# Patient Record
Sex: Female | Born: 1935 | Race: Black or African American | Hispanic: No | Smoking: Current every day smoker
Health system: Southern US, Community
[De-identification: ages and names within clinical notes are randomized; demographics above are authoritative.]

## PROBLEM LIST (undated history)

## (undated) DIAGNOSIS — R202 Paresthesia of skin: Secondary | ICD-10-CM

## (undated) DIAGNOSIS — M51369 Other intervertebral disc degeneration, lumbar region without mention of lumbar back pain or lower extremity pain: Secondary | ICD-10-CM

## (undated) DIAGNOSIS — E559 Vitamin D deficiency, unspecified: Secondary | ICD-10-CM

## (undated) DIAGNOSIS — M1990 Unspecified osteoarthritis, unspecified site: Secondary | ICD-10-CM

## (undated) DIAGNOSIS — I639 Cerebral infarction, unspecified: Secondary | ICD-10-CM

## (undated) DIAGNOSIS — R55 Syncope and collapse: Secondary | ICD-10-CM

## (undated) DIAGNOSIS — I82409 Acute embolism and thrombosis of unspecified deep veins of unspecified lower extremity: Secondary | ICD-10-CM

## (undated) DIAGNOSIS — G5600 Carpal tunnel syndrome, unspecified upper limb: Secondary | ICD-10-CM

## (undated) DIAGNOSIS — M5136 Other intervertebral disc degeneration, lumbar region: Secondary | ICD-10-CM

## (undated) DIAGNOSIS — J309 Allergic rhinitis, unspecified: Secondary | ICD-10-CM

## (undated) DIAGNOSIS — N63 Unspecified lump in unspecified breast: Secondary | ICD-10-CM

## (undated) DIAGNOSIS — I10 Essential (primary) hypertension: Secondary | ICD-10-CM

## (undated) HISTORY — PX: CARPAL TUNNEL RELEASE: SHX101

## (undated) HISTORY — DX: Paresthesia of skin: R20.2

## (undated) HISTORY — DX: Carpal tunnel syndrome, unspecified upper limb: G56.00

## (undated) HISTORY — PX: ABDOMINAL HYSTERECTOMY: SHX81

## (undated) HISTORY — PX: TOTAL HIP ARTHROPLASTY: SHX124

## (undated) HISTORY — PX: HEMORROIDECTOMY: SUR656

## (undated) HISTORY — DX: Allergic rhinitis, unspecified: J30.9

## (undated) HISTORY — DX: Vitamin D deficiency, unspecified: E55.9

## (undated) HISTORY — PX: ULNAR NERVE TRANSPOSITION: SHX2595

## (undated) HISTORY — DX: Acute embolism and thrombosis of unspecified deep veins of unspecified lower extremity: I82.409

## (undated) HISTORY — DX: Other intervertebral disc degeneration, lumbar region: M51.36

## (undated) HISTORY — DX: Other intervertebral disc degeneration, lumbar region without mention of lumbar back pain or lower extremity pain: M51.369

## (undated) HISTORY — PX: OTHER SURGICAL HISTORY: SHX169

## (undated) HISTORY — DX: Unspecified lump in unspecified breast: N63.0

## (undated) HISTORY — DX: Syncope and collapse: R55

## (undated) HISTORY — DX: Unspecified osteoarthritis, unspecified site: M19.90

## (undated) HISTORY — PX: CATARACT EXTRACTION: SUR2

---

## 1997-07-18 ENCOUNTER — Emergency Department (HOSPITAL_COMMUNITY): Admission: EM | Admit: 1997-07-18 | Discharge: 1997-07-18 | Payer: Self-pay | Admitting: *Deleted

## 1999-05-26 ENCOUNTER — Other Ambulatory Visit: Admission: RE | Admit: 1999-05-26 | Discharge: 1999-05-26 | Payer: Self-pay | Admitting: Family Medicine

## 2001-11-06 ENCOUNTER — Encounter: Admission: RE | Admit: 2001-11-06 | Discharge: 2001-11-06 | Payer: Self-pay | Admitting: Family Medicine

## 2001-11-06 ENCOUNTER — Encounter: Payer: Self-pay | Admitting: Family Medicine

## 2002-05-21 ENCOUNTER — Encounter: Payer: Self-pay | Admitting: Family Medicine

## 2002-05-21 ENCOUNTER — Encounter: Admission: RE | Admit: 2002-05-21 | Discharge: 2002-05-21 | Payer: Self-pay | Admitting: Family Medicine

## 2004-07-28 ENCOUNTER — Other Ambulatory Visit: Admission: RE | Admit: 2004-07-28 | Discharge: 2004-07-28 | Payer: Self-pay | Admitting: Family Medicine

## 2004-08-10 ENCOUNTER — Ambulatory Visit (HOSPITAL_COMMUNITY): Admission: RE | Admit: 2004-08-10 | Discharge: 2004-08-10 | Payer: Self-pay | Admitting: Family Medicine

## 2004-12-17 ENCOUNTER — Ambulatory Visit (HOSPITAL_COMMUNITY): Admission: RE | Admit: 2004-12-17 | Discharge: 2004-12-17 | Payer: Self-pay | Admitting: Gastroenterology

## 2005-01-05 IMAGING — CR DG CHEST 2V
2 series · 2 of 2 positions shown · non-contrast
Comparison: Report from prior exam dated [DATE].

CLINICAL DATA: Osteoarthritis.  Preoperative for right total hip arthroplasty.  History of smoking.  
 CHEST - 2 VIEW:

[view not recorded (1 of 2)]
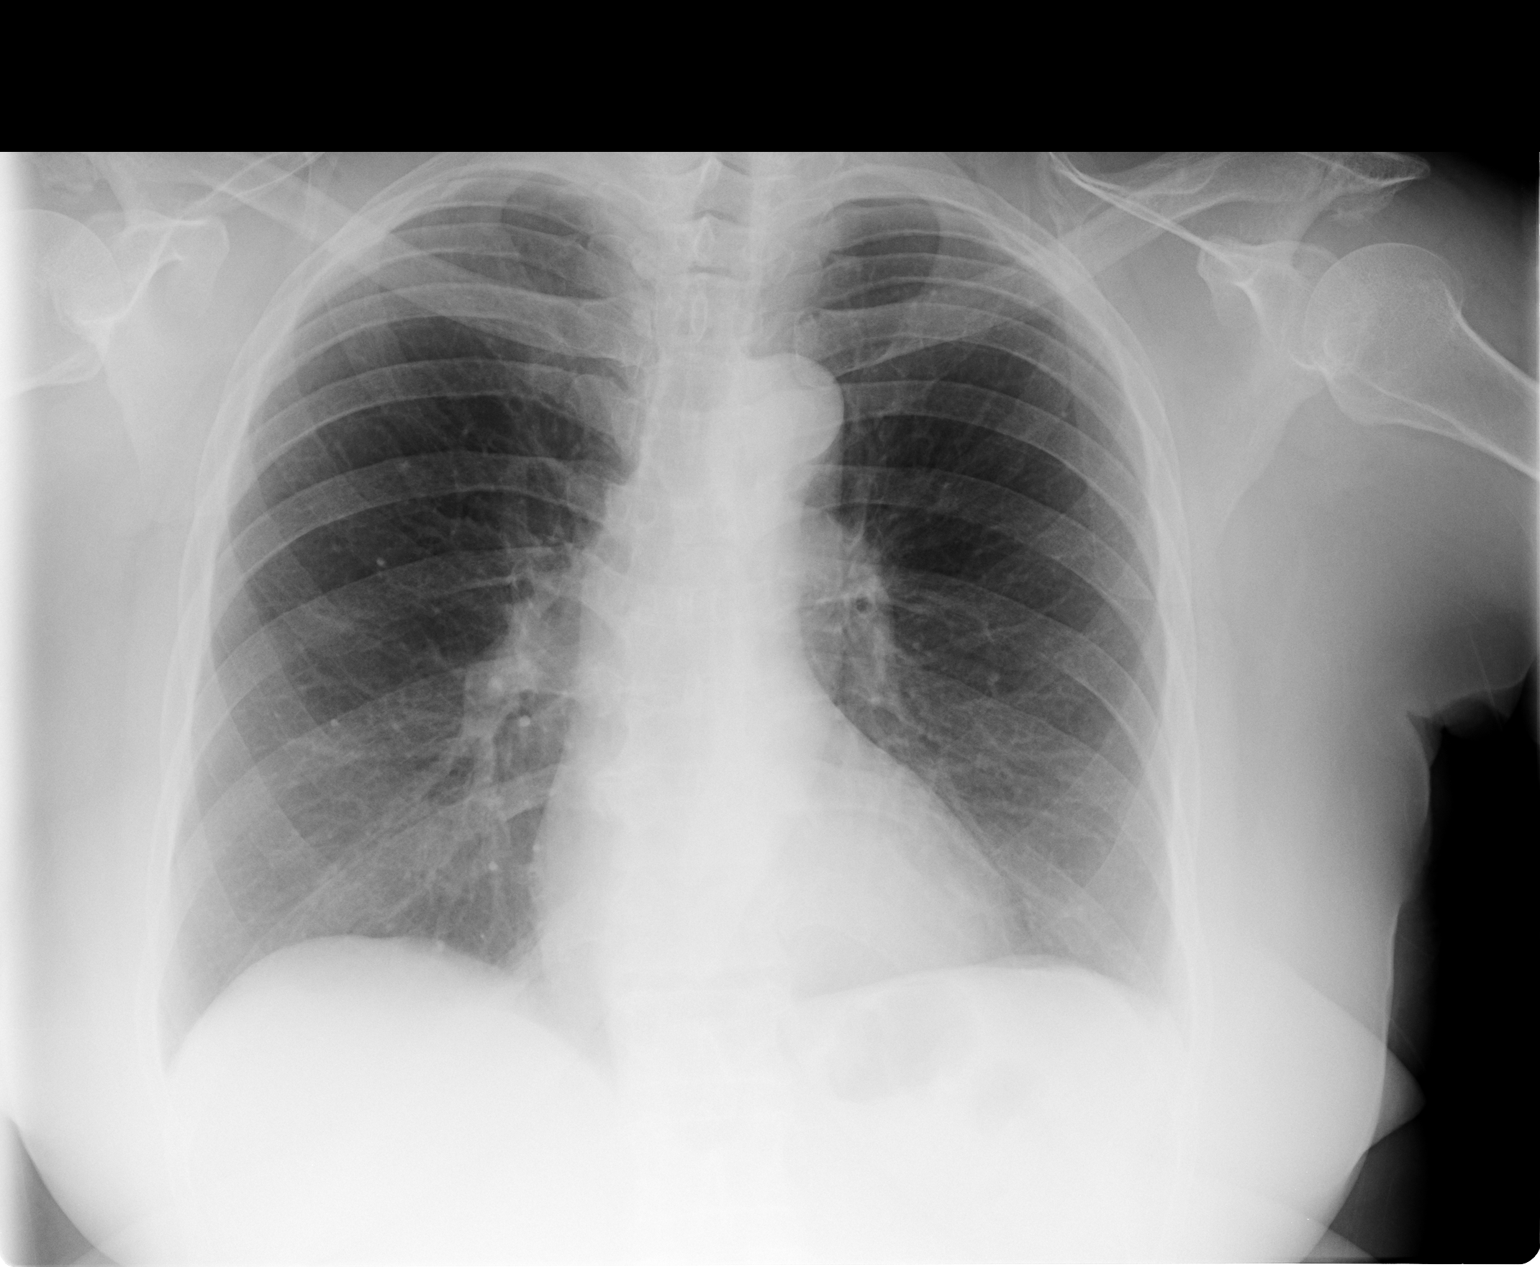

[view not recorded (2 of 2)]
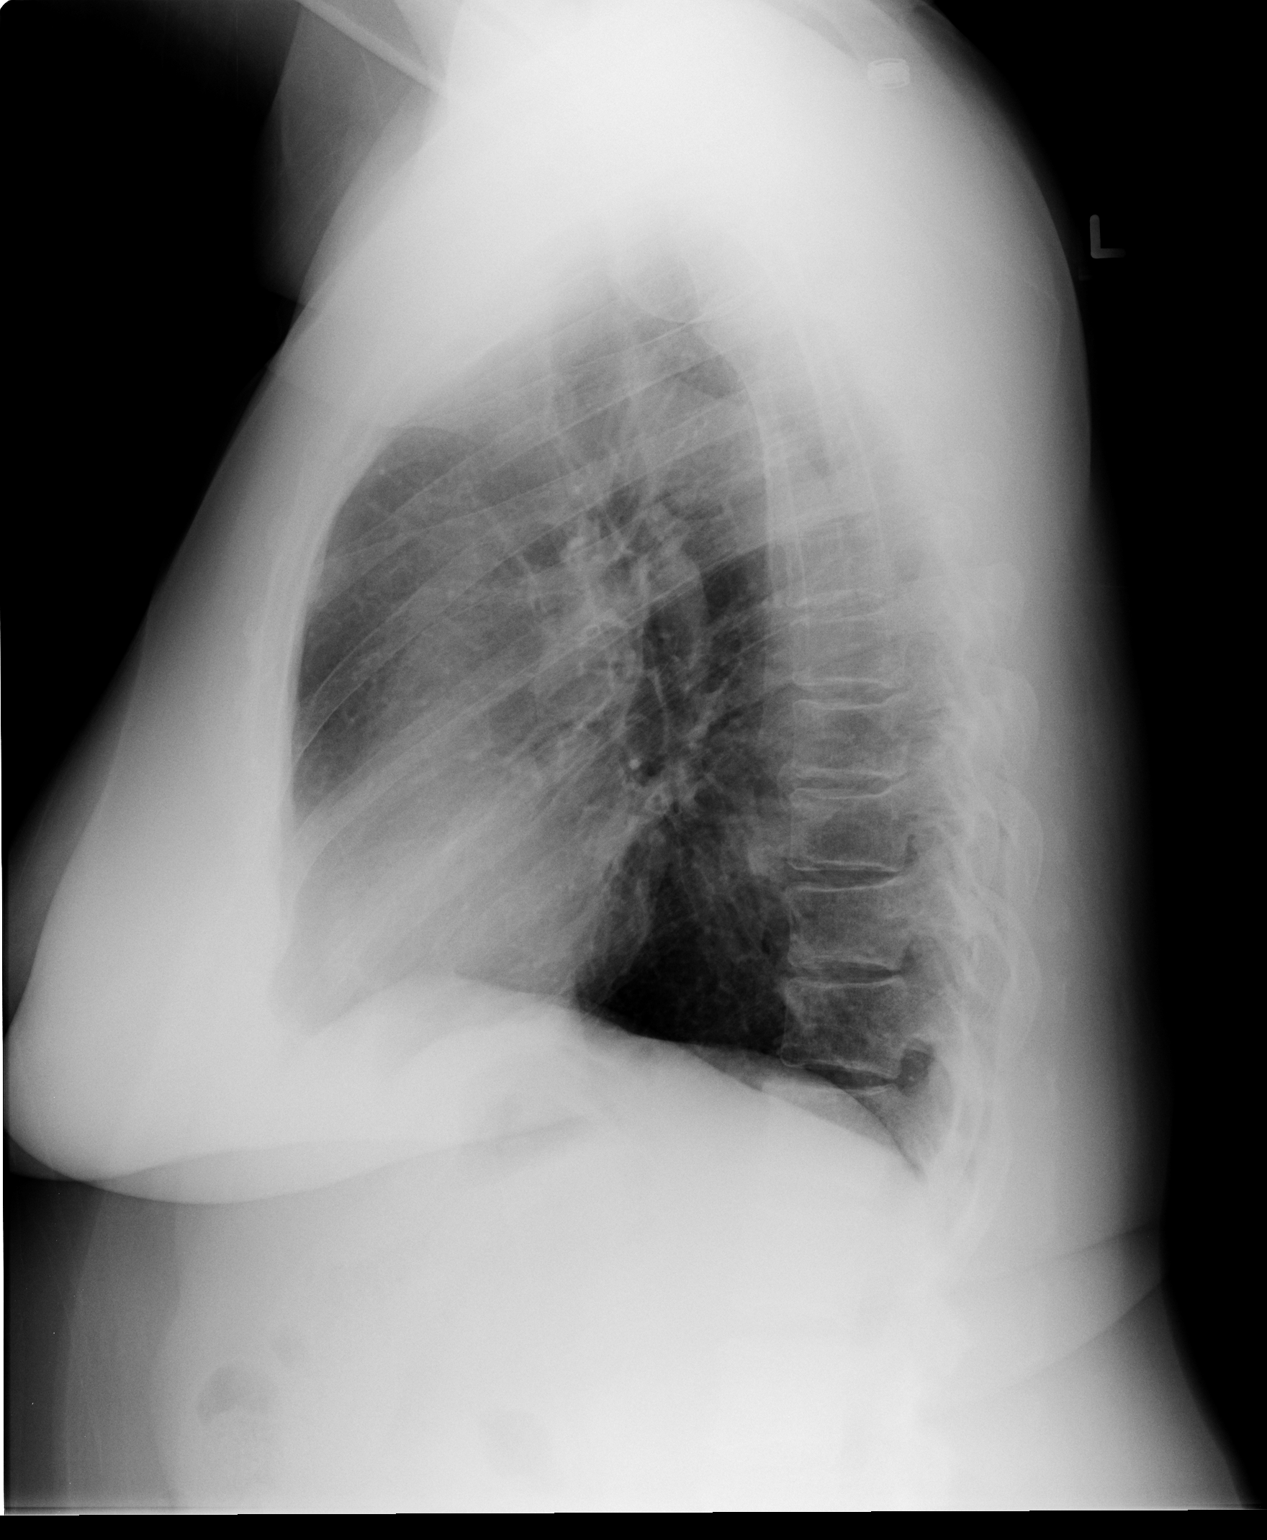

[2 of 2 positions shown; findings below may reference images not displayed]

Mild airway thickening is present.  Heart and mediastinum appear unremarkable.  
 On the lateral view there is vague nodularity anterior to the T9-10 level which is likely due to confluent vascular shadows and less likely to represent a pulmonary nodule.  No nodule is seen on the frontal view.  I do recommend followup chest radiography in one month?s time to reevaluate this region.   
 Alternatively, chest CT could be utilized to clear this region.
IMPRESSION: 1.  Airway thickening suggesting bronchitis.  
 2.  Vague nodularity just anterior to the thoracic spine at approximately the T9-10 level, only seen on the lateral view.   This is likely due to confluence of vascular shadows.  Recommend either followup chest radiography or chest CT to ensure clearance and exclude the unlikely possibility of malignancy.

## 2005-01-10 ENCOUNTER — Inpatient Hospital Stay (HOSPITAL_COMMUNITY): Admission: RE | Admit: 2005-01-10 | Discharge: 2005-01-14 | Payer: Self-pay | Admitting: Orthopedic Surgery

## 2005-01-10 IMAGING — CR DG HIP COMPLETE 2+V*R*
2 series · 2 of 2 positions shown · non-contrast
Comparison: none

CLINICAL DATA: Osteoarthritis, status post right total hip arthroplasty.
 RIGHT HIP ? 2 VIEWS:

[view not recorded (1 of 2)]
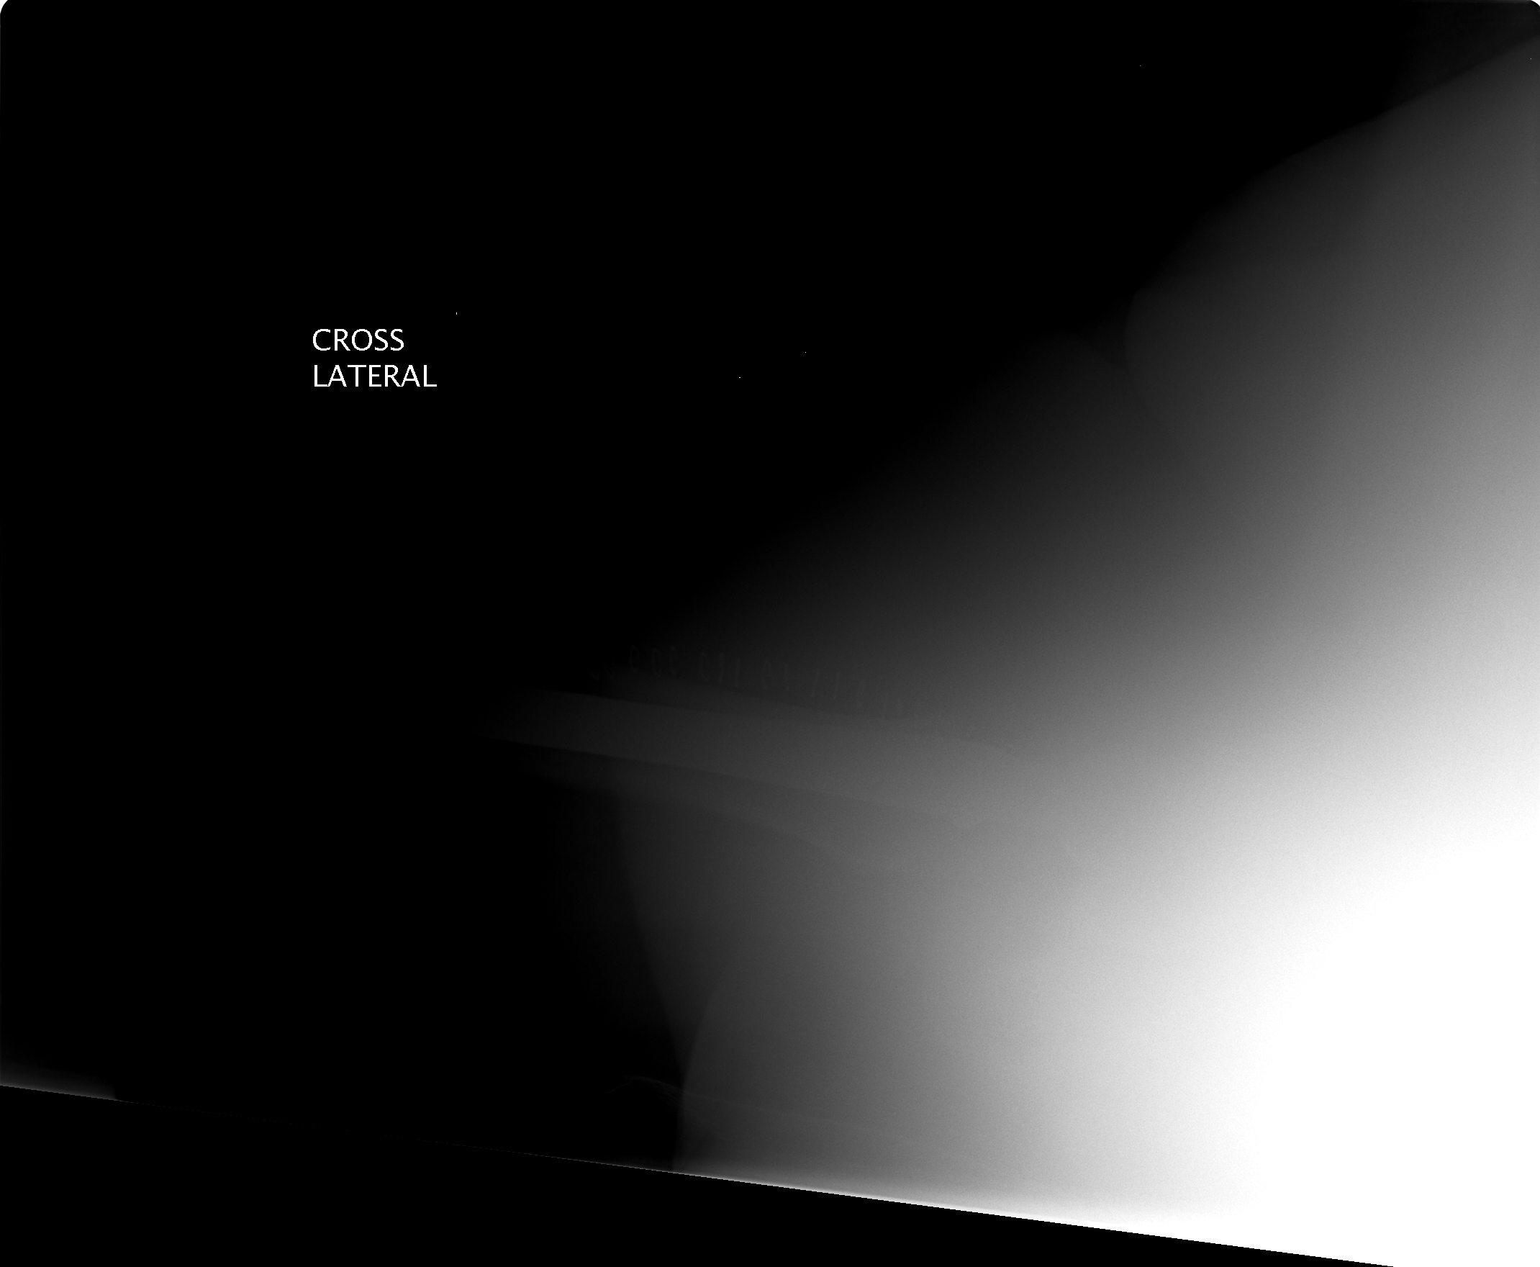

[view not recorded (2 of 2)]
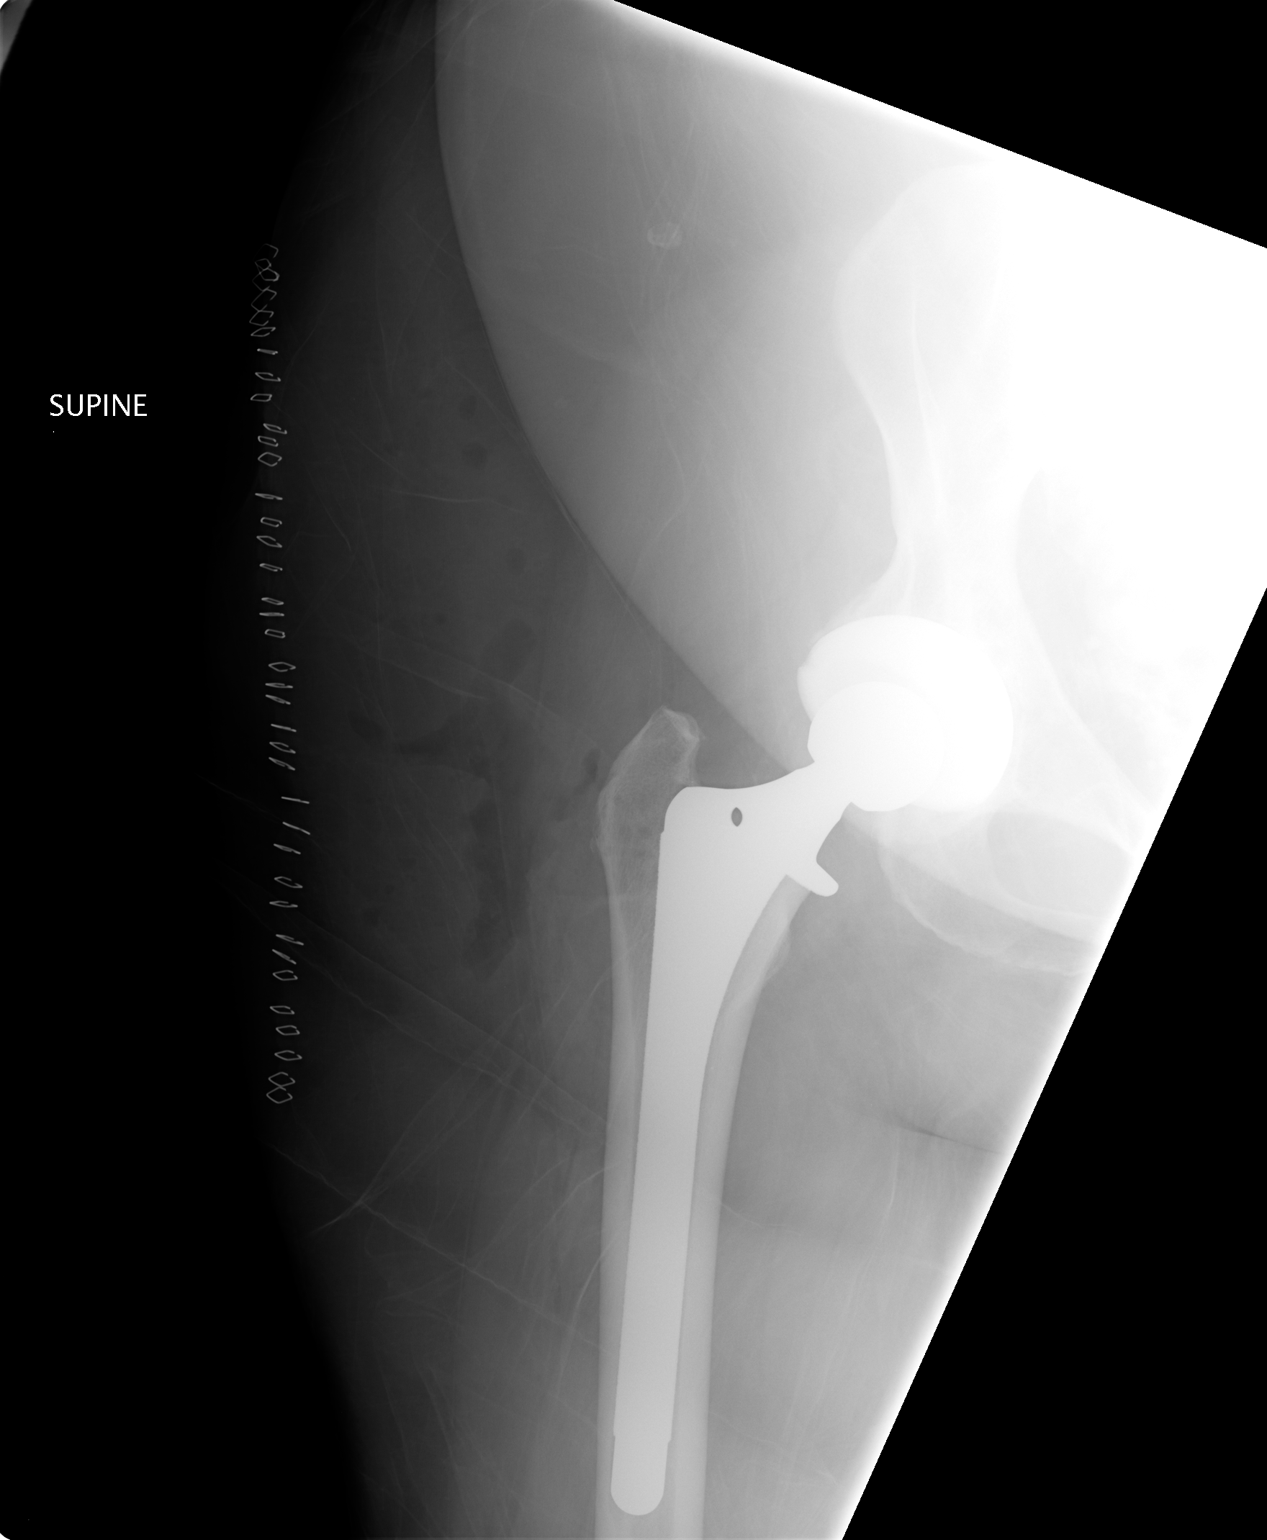

[2 of 2 positions shown; findings below may reference images not displayed]

FINDINGS: The crosstable lateral view of the right hip is non-diagnostic due to patient body habitus.  The frontal view of the right hip shows a total hip arthroplasty without immediate complicating features.  Subcutaneous air and surgical skin staples are noted.
IMPRESSION: Please see above.

## 2005-01-12 IMAGING — US US RENAL
1 series · 14 of 24 positions shown · non-contrast
Comparison: none

CLINICAL DATA: This is a 76-year-old male with decreased urine output.  Elevated creatinine of 2.2.  Hypertension. 
RENAL/URINARY TRACT ULTRASOUND:
TECHNIQUE: Complete ultrasound examination of the urinary tract was performed including evaluation of the kidneys, renal collecting systems, and urinary bladder.

[Series 1: unknown · 0.29mm/px · 14 of 24 slices shown]
[im 1/24]
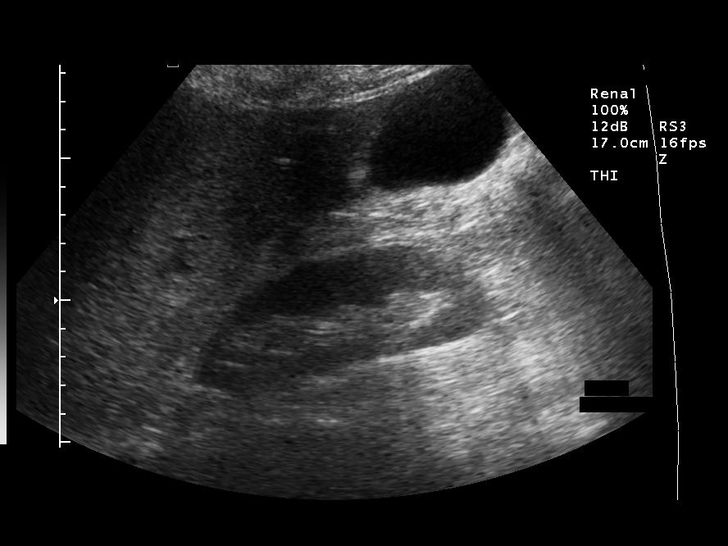
[im 3/24]
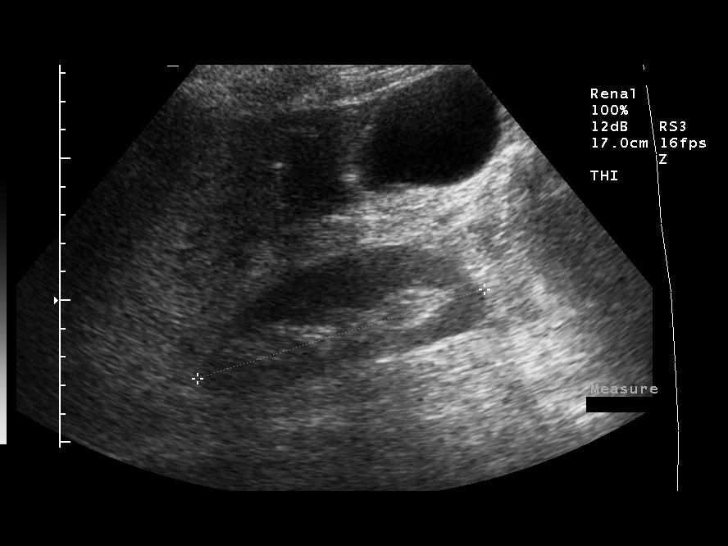
[im 5/24]
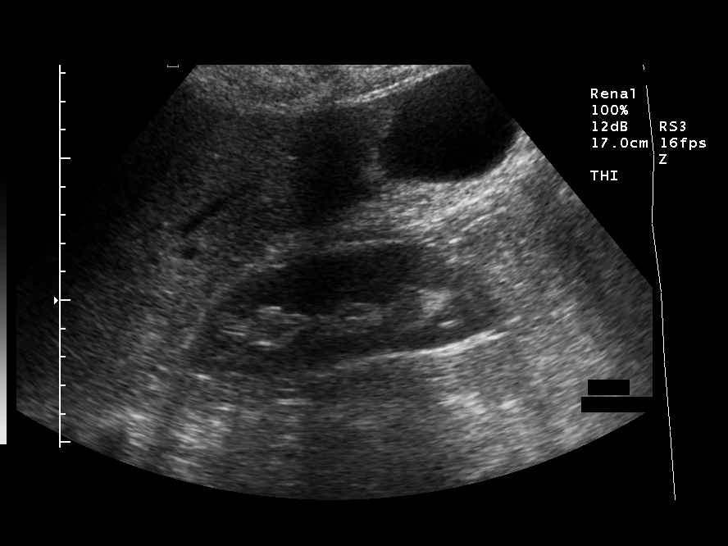
[im 7/24]
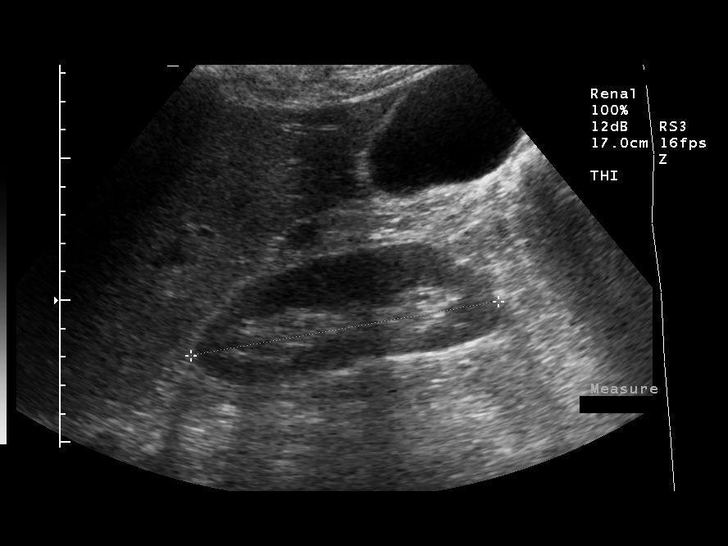
[im 8/24]
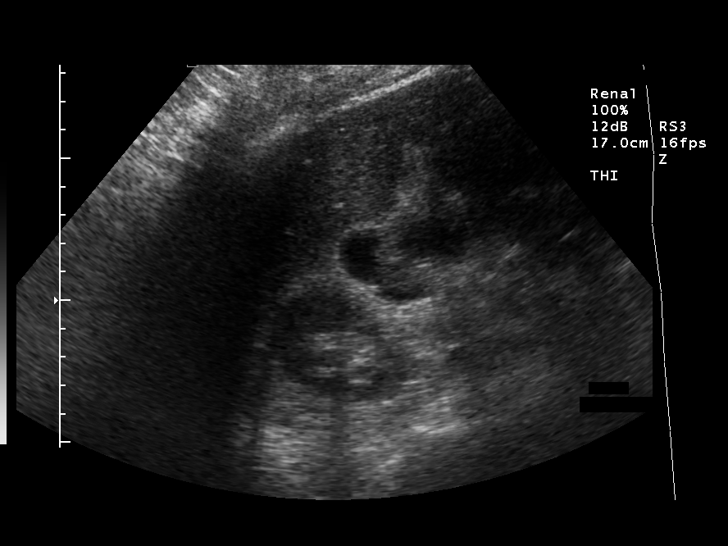
[im 10/24]
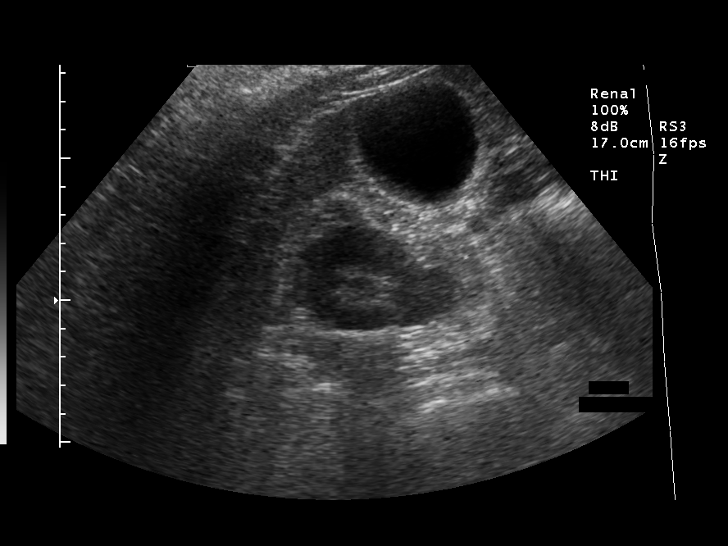
[im 12/24]
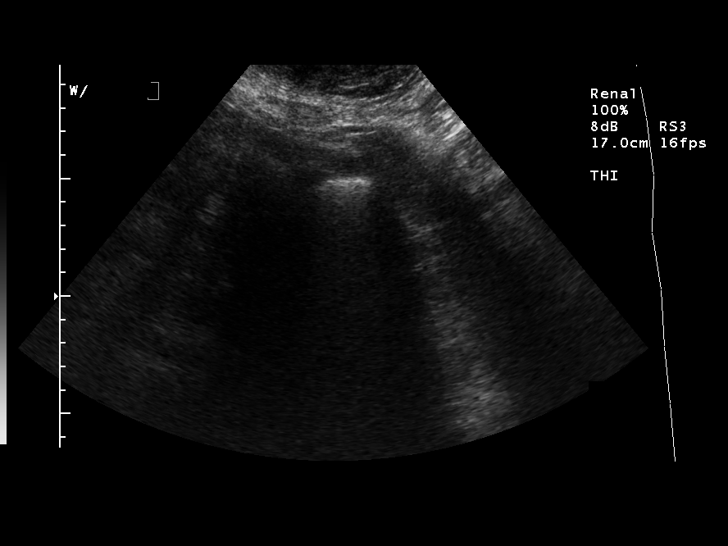
[im 13/24]
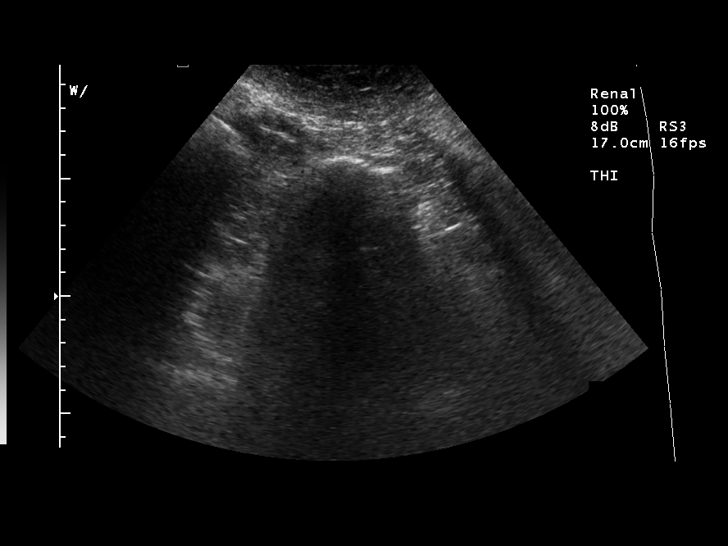
[im 15/24]
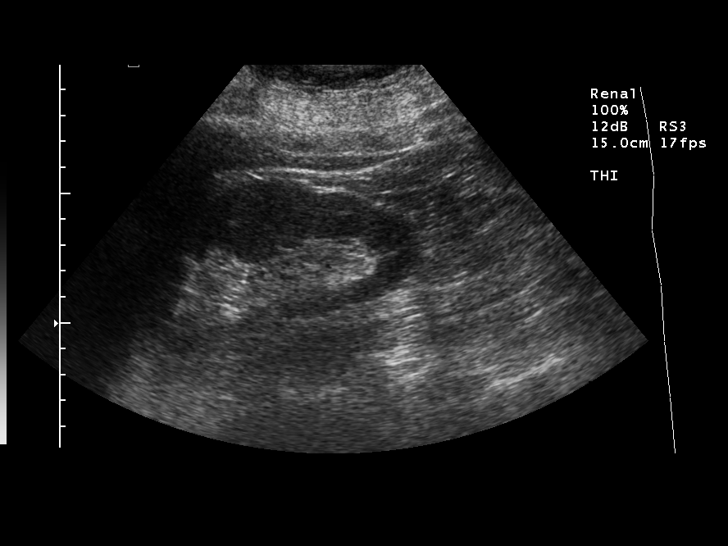
[im 17/24]
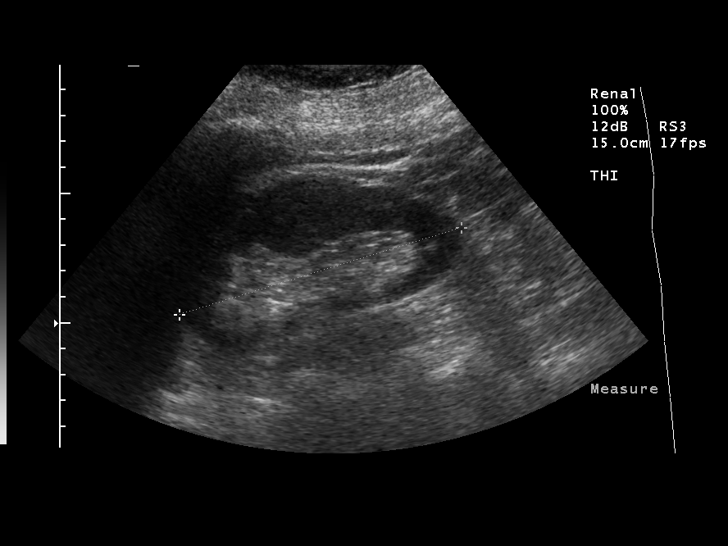
[im 19/24]
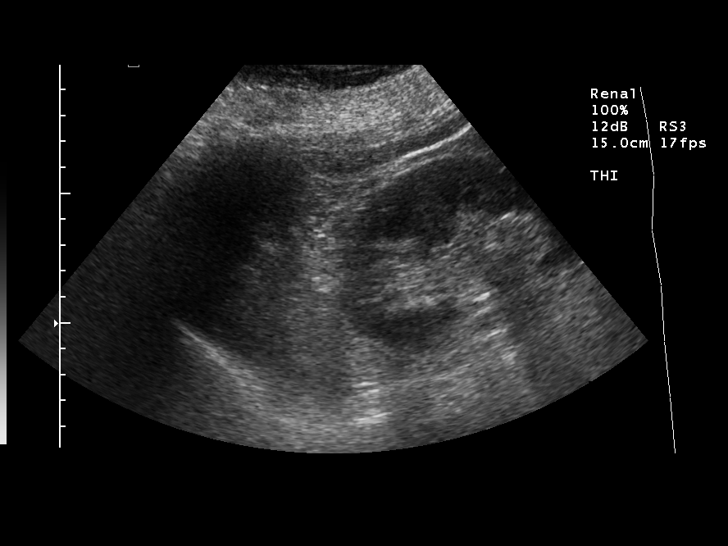
[im 20/24]
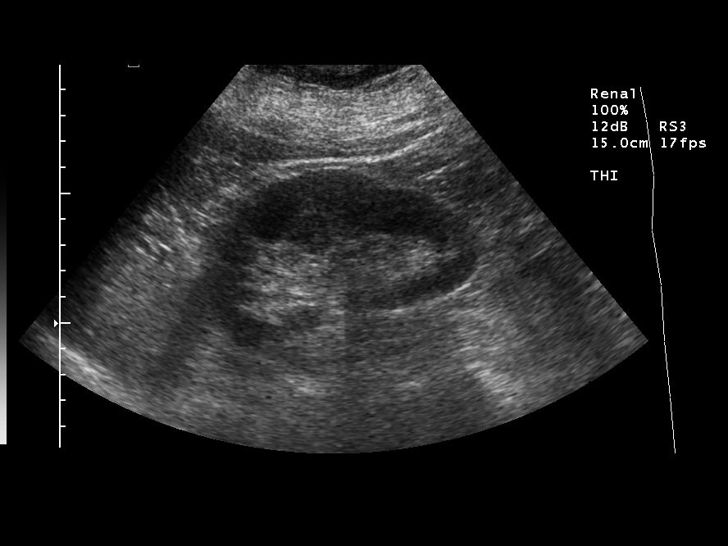
[im 22/24]
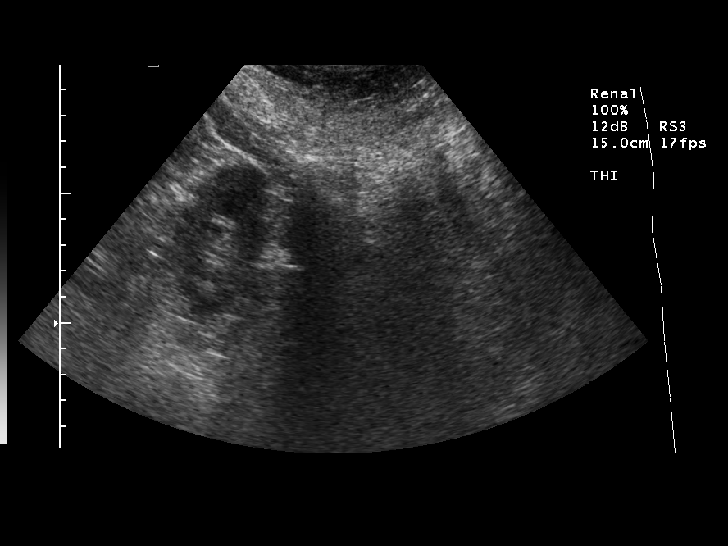
[im 24/24]
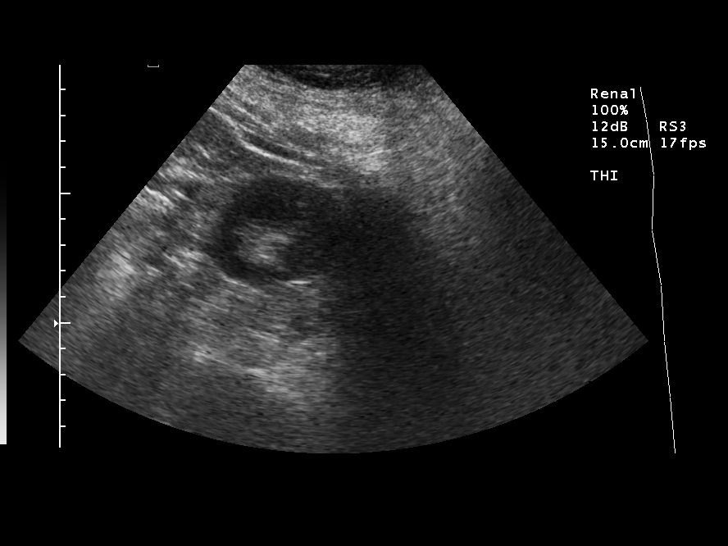

[14 of 24 positions shown; findings below may reference images not displayed]

FINDINGS: The right kidney measures 11 cm.  The left kidney measures 11.4 cm.  No acute hydronephrosis or obstruction.  The bladder is decompressed, containing a Foley catheter.  No abdominal ascites.
IMPRESSION: No acute finding by ultrasound.

## 2005-02-10 IMAGING — CR DG CHEST 2V
2 series · 2 of 2 positions shown · non-contrast
Comparison: [DATE].

CLINICAL DATA: Infected right hip.  Pre-admission.  
 CHEST - 2 VIEWS:

[view not recorded (1 of 2)]
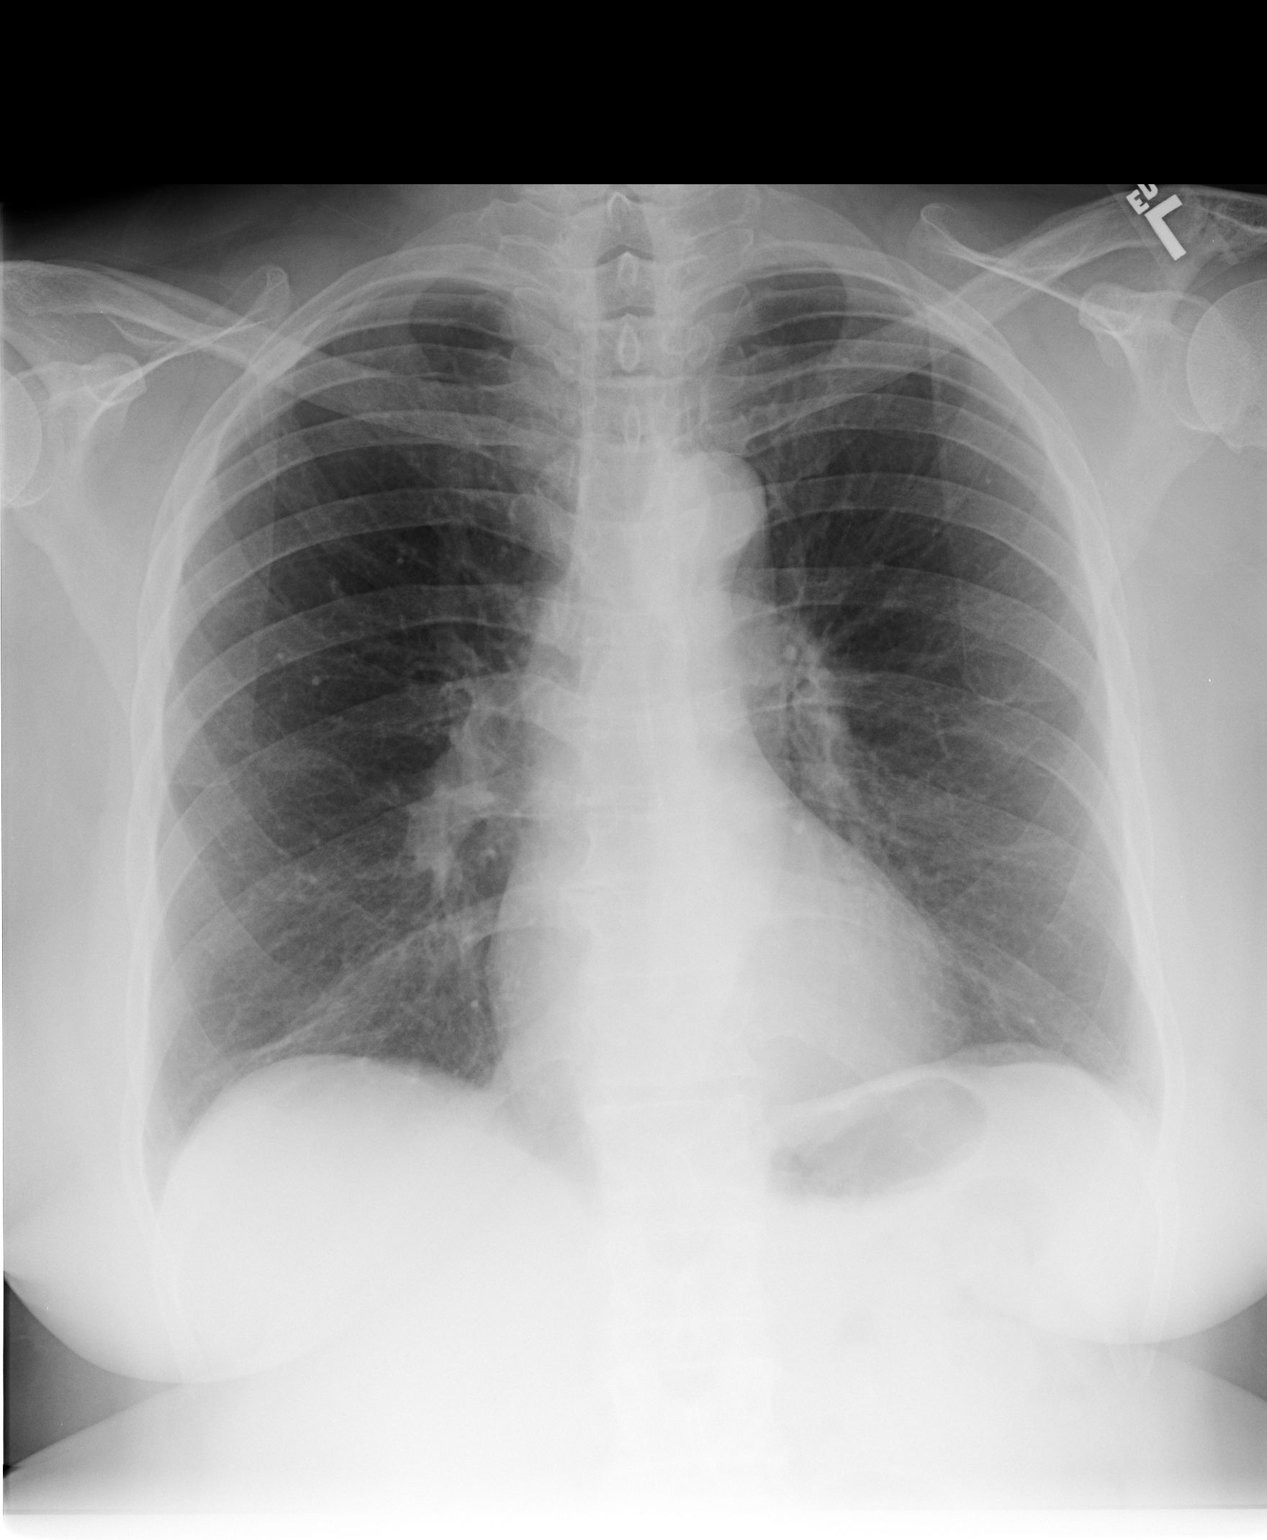

[view not recorded (2 of 2)]
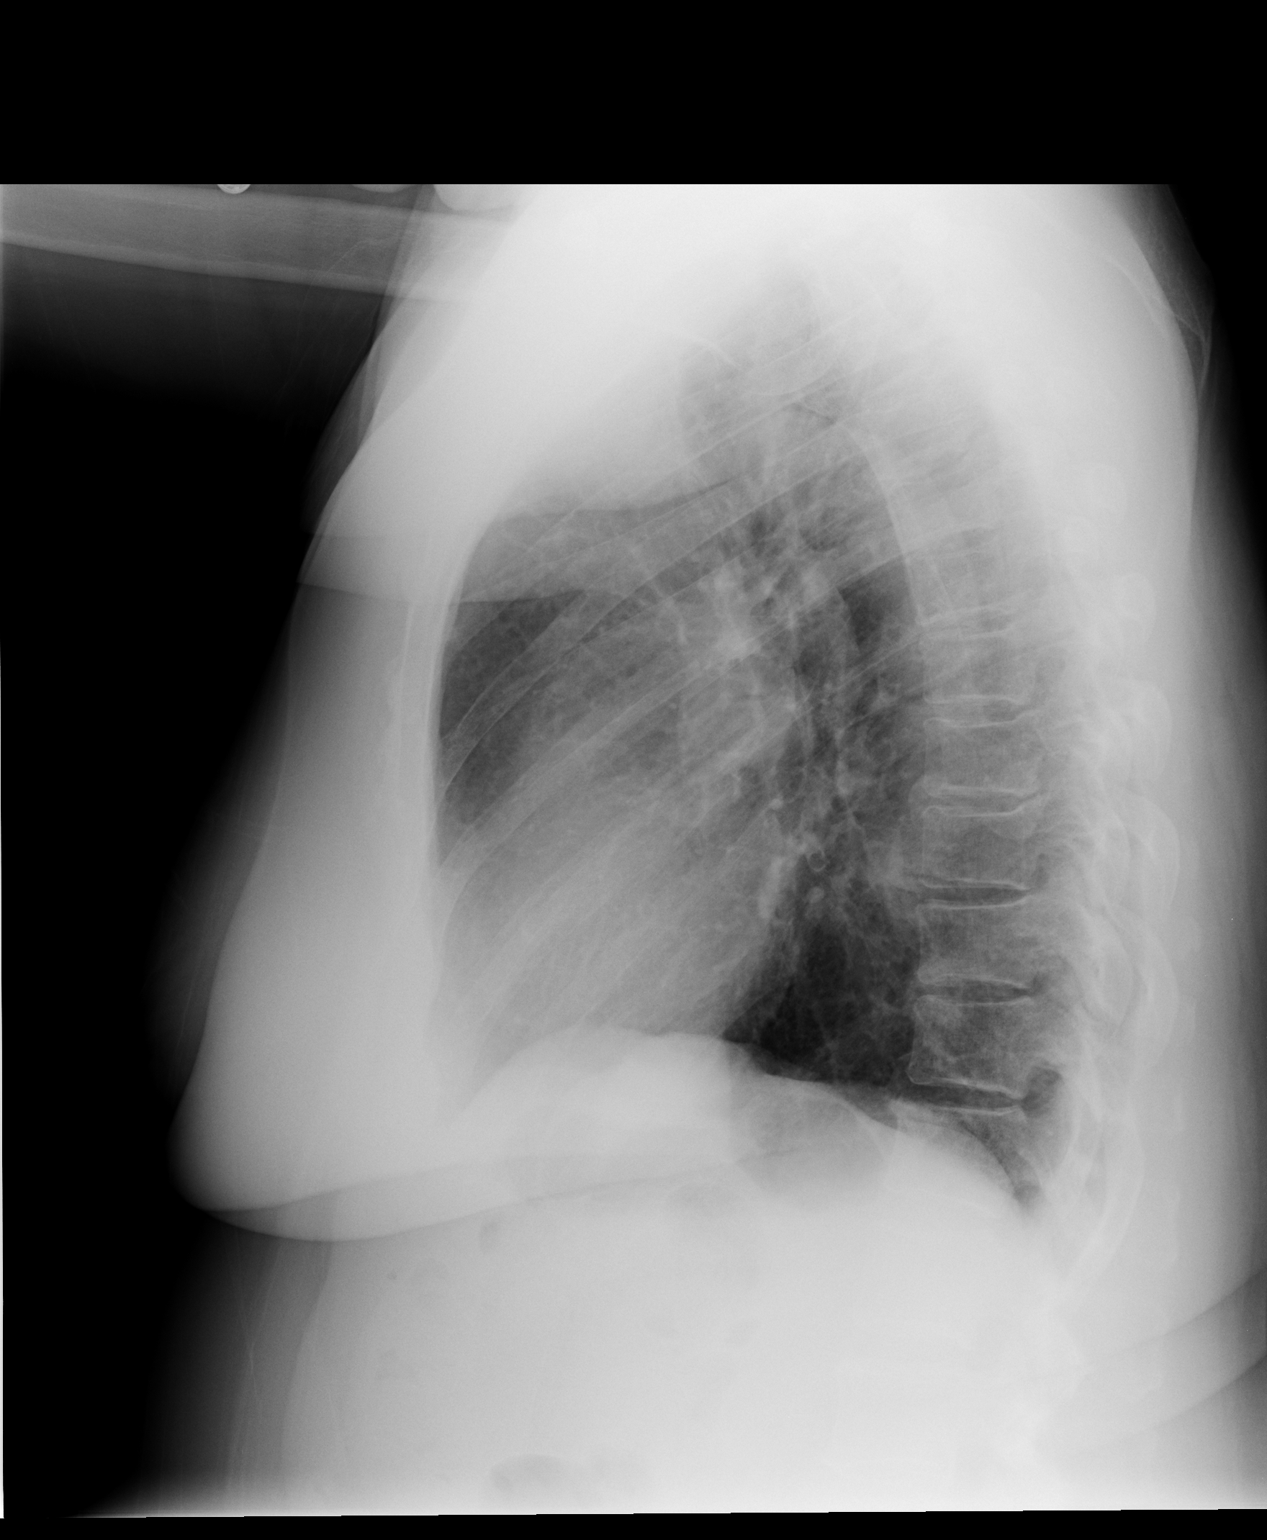

[2 of 2 positions shown; findings below may reference images not displayed]

FINDINGS: There is COPD.  There is no infiltrate or effusion.  There is no heart failure.  There is minimal right lower lobe atelectasis.  Density located anterior to the thoracic spine is likely vascular in nature, and is less worrisome on today?s study compared with prior study.
IMPRESSION: COPD and mild right lower lobe atelectasis.  No acute abnormality.

## 2005-02-14 ENCOUNTER — Observation Stay (HOSPITAL_COMMUNITY): Admission: RE | Admit: 2005-02-14 | Discharge: 2005-02-15 | Payer: Self-pay | Admitting: Orthopedic Surgery

## 2005-02-21 ENCOUNTER — Ambulatory Visit (HOSPITAL_COMMUNITY): Admission: RE | Admit: 2005-02-21 | Discharge: 2005-02-21 | Payer: Self-pay | Admitting: Orthopedic Surgery

## 2005-02-21 ENCOUNTER — Inpatient Hospital Stay (HOSPITAL_COMMUNITY): Admission: EM | Admit: 2005-02-21 | Discharge: 2005-03-03 | Payer: Self-pay | Admitting: Emergency Medicine

## 2005-02-24 IMAGING — XA IR US GUIDE VASC ACCESS RIGHT
1 series · 13 of 13 positions shown · non-contrast
Comparison: none

CLINICAL DATA: Bilateral lower extremity DVT. Subtherapeutic anticoagulation
with recurrent hematoma at operative site. Additional P.E. prophylaxis is
requested.

[Series 1: run · 13 of 13 slices shown]
[im 1/13]
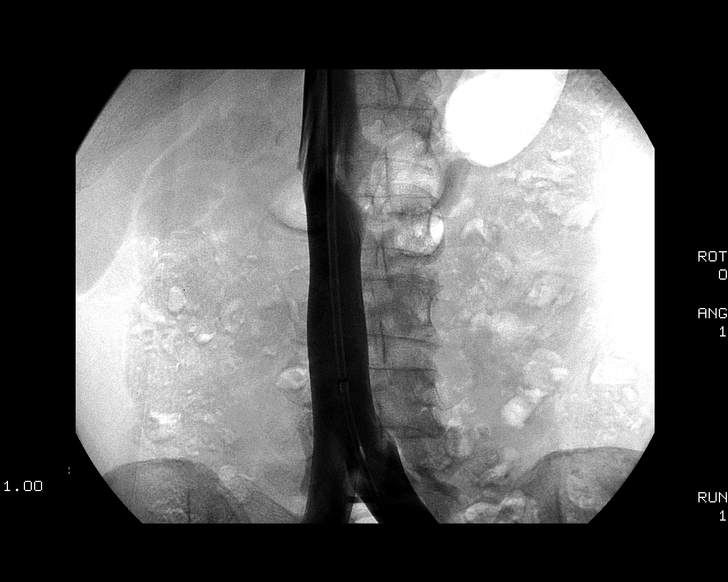
[im 2/13]
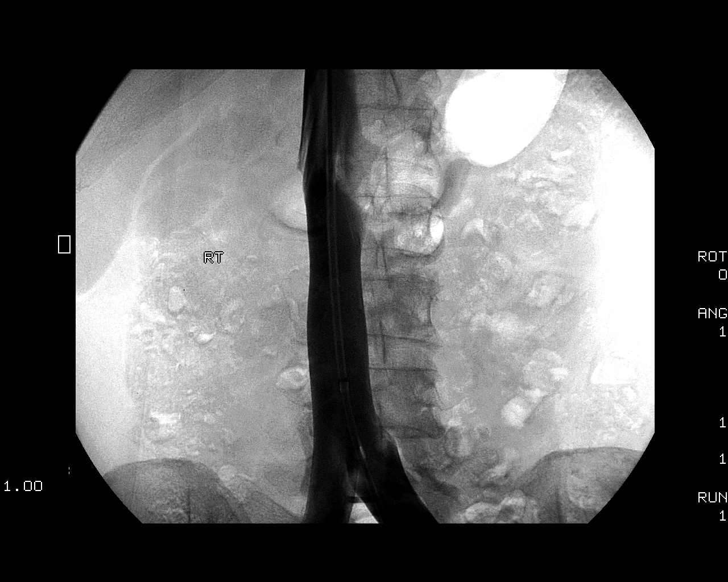
[im 3/13]
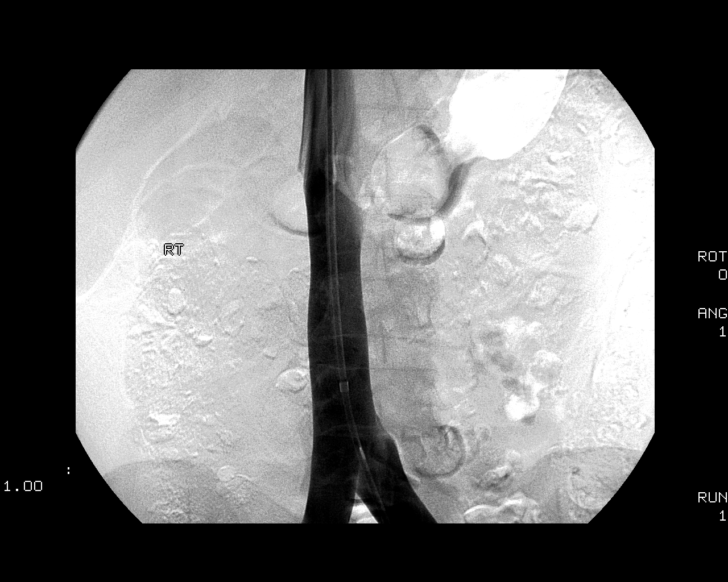
[im 4/13]
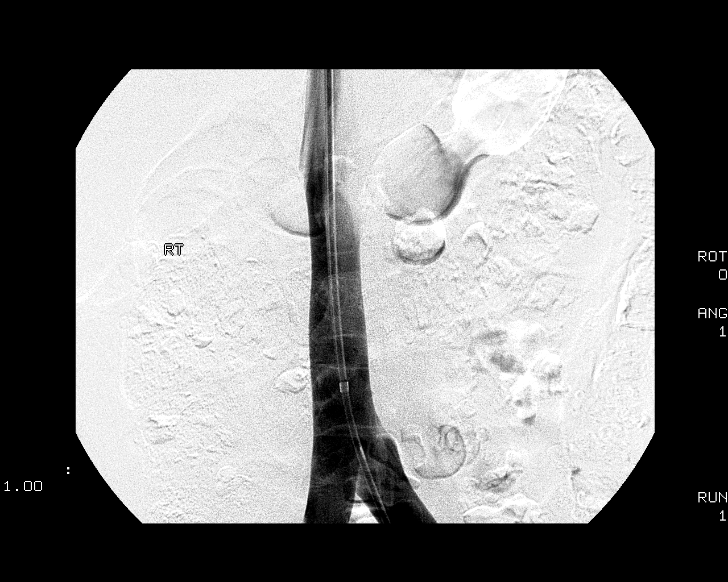
[im 5/13]
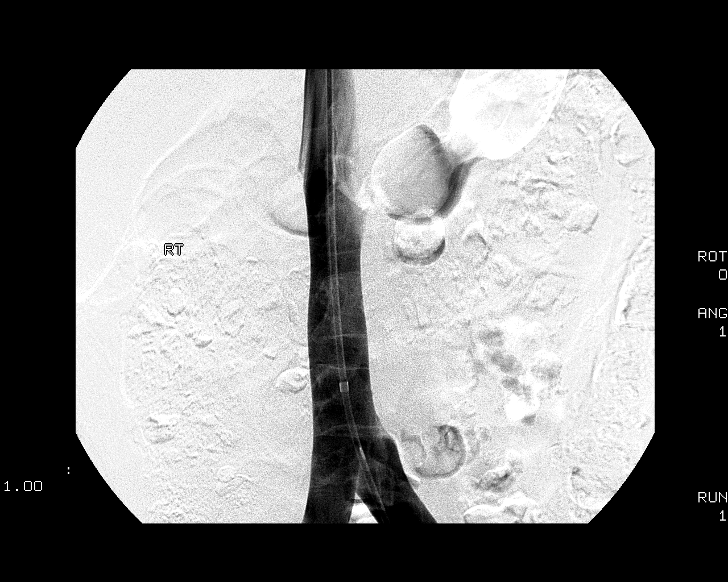
[im 6/13]
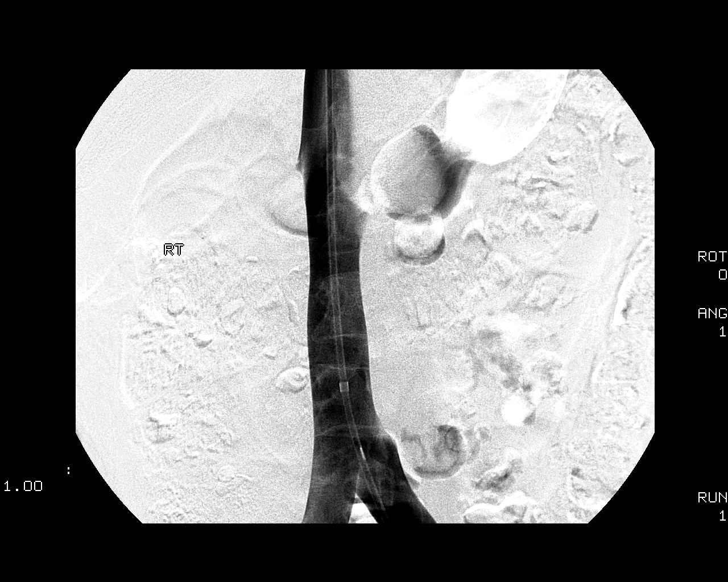
[im 7/13]
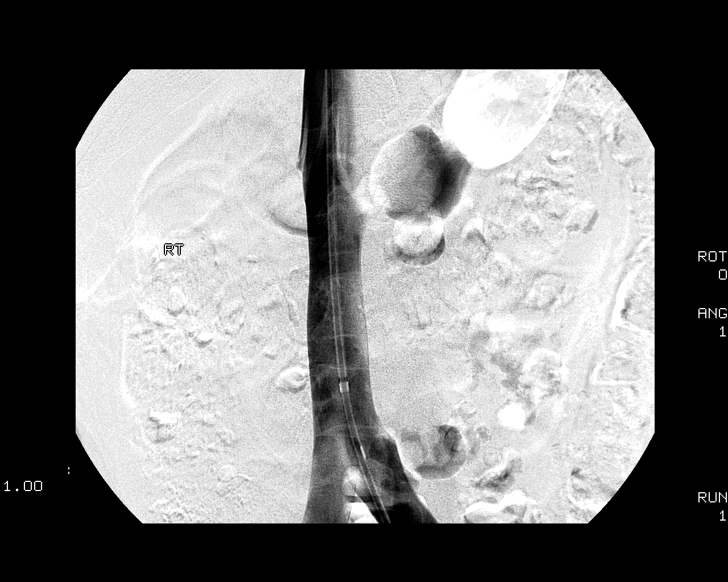
[im 8/13]
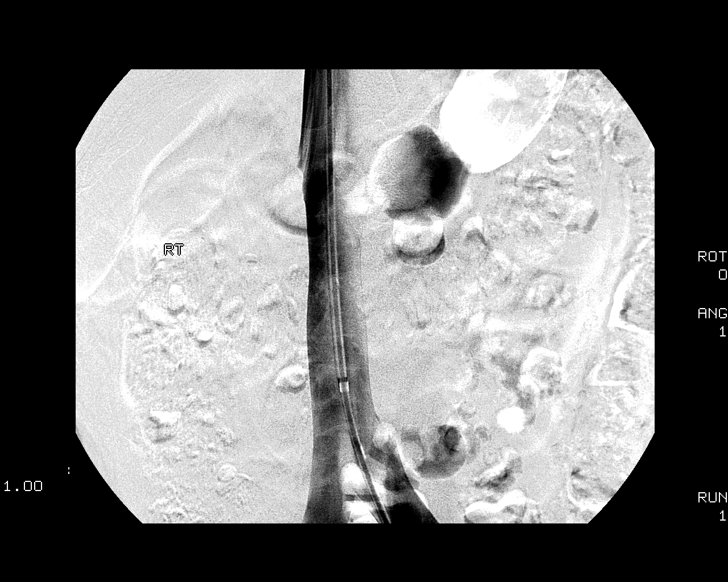
[im 9/13]
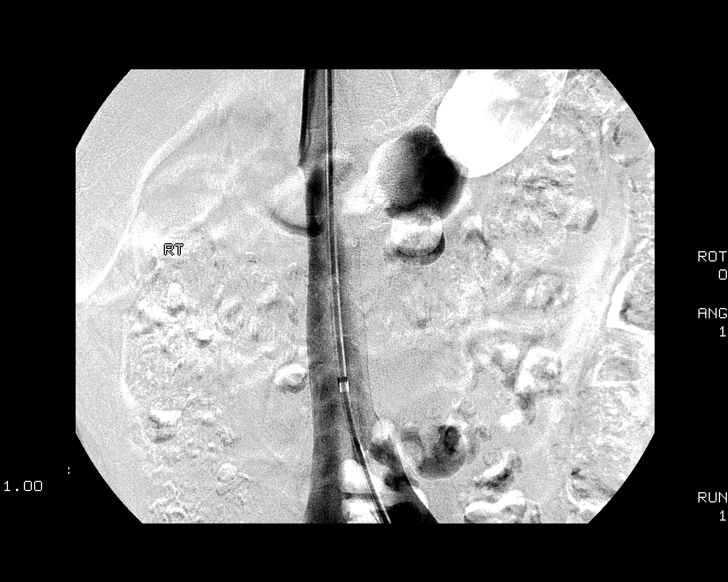
[im 10/13]
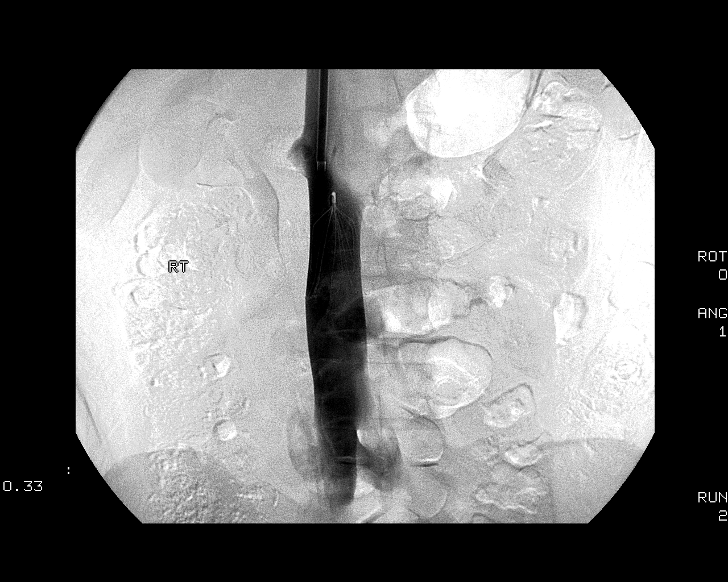
[im 11/13]
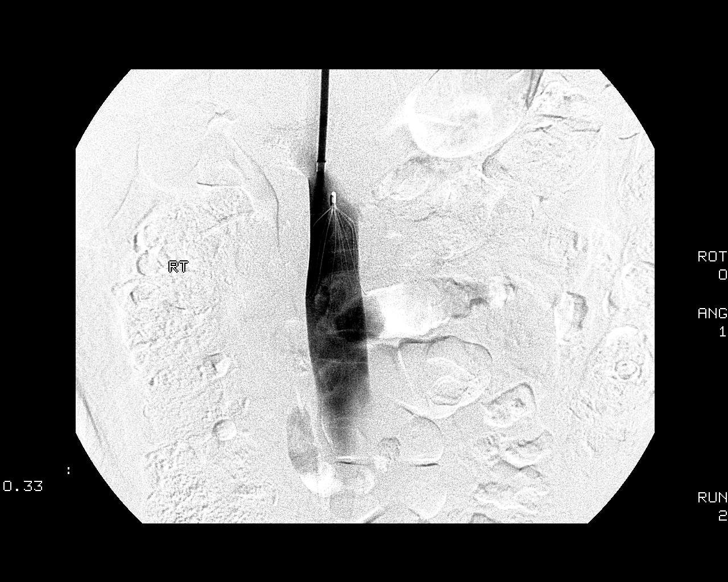
[im 12/13]
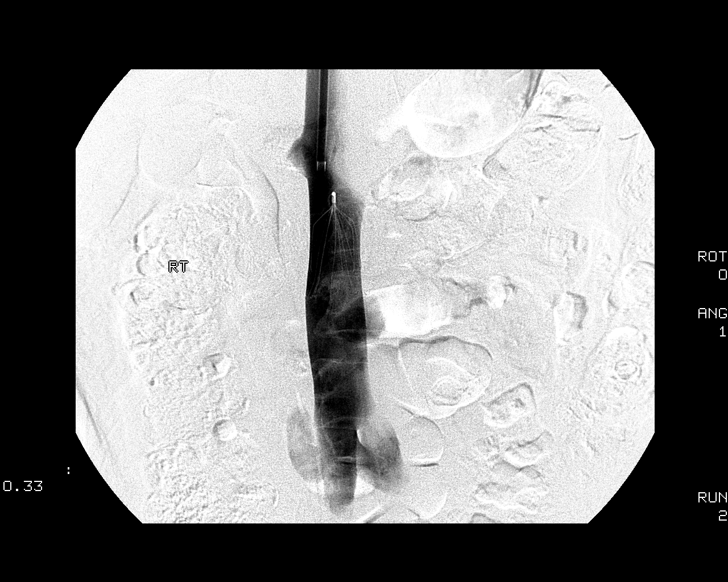
[im 13/13]
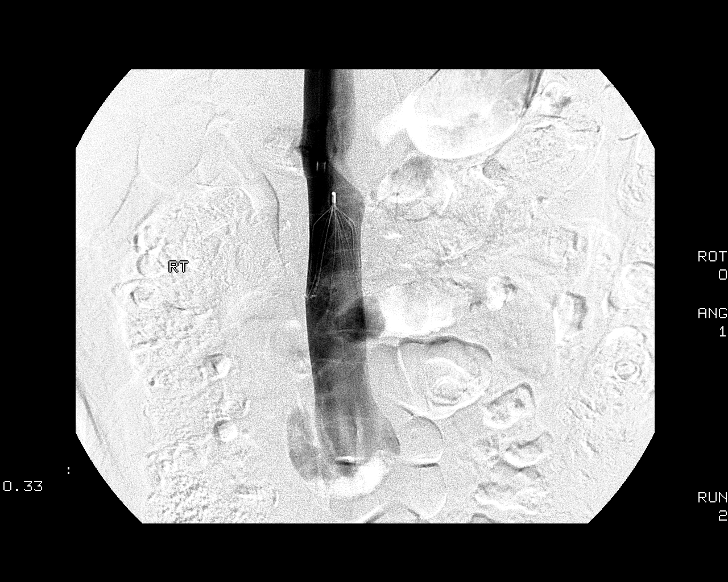

[13 of 13 positions shown; findings below may reference images not displayed]

INFERIOR VENACAVOGRAM:
IVC FILTER PLACEMENT UNDER FLUOROSCOPY:

Patency of the right IJ vein was confirmed with ultrasound. An appropriate skin
entry site was determined. Skin site was marked, prepped with Betadine, and
draped in usual sterile fashion, and infiltrated locally with 1% lidocaine.
Intravenous fentanyl and Versed were administered as conscious sedation during
continuous cardiorespiratory monitoring by the radiology RN. The right IJ vein
was accessed with a 21 gauge micropuncture needle under real-time ultrasound
guidance. The needle was exchanged over a 018 guidewire for a transitional
dilator, which allow advancement of the Benson wire into the IVC. A long
vascular sheath was placed to the left iliac vein for inferior venacavography.
This demonstrated no caval thrombus. Renal vein inflows were evident.

The G2 IVC filter was advanced through the sheath and successfully deployed
under fluoroscopy at the L3 level. Followup cavagram demonstrates stable filter
position  and no evident complication. The sheath was removed and hemostasis
achieved at the site. No immediate complication.
IMPRESSION: 1.  Normal IVC. No thrombus or significant anatomic variation.
2.  Technically successful infrarenal IVC filter placement.

## 2005-03-18 ENCOUNTER — Emergency Department (HOSPITAL_COMMUNITY): Admission: EM | Admit: 2005-03-18 | Discharge: 2005-03-18 | Payer: Self-pay | Admitting: Emergency Medicine

## 2005-03-19 ENCOUNTER — Emergency Department (HOSPITAL_COMMUNITY): Admission: EM | Admit: 2005-03-19 | Discharge: 2005-03-19 | Payer: Self-pay | Admitting: Emergency Medicine

## 2005-03-19 ENCOUNTER — Ambulatory Visit (HOSPITAL_COMMUNITY): Admission: RE | Admit: 2005-03-19 | Discharge: 2005-03-19 | Payer: Self-pay | Admitting: Emergency Medicine

## 2005-03-21 ENCOUNTER — Ambulatory Visit: Payer: Self-pay | Admitting: Hematology and Oncology

## 2005-04-26 ENCOUNTER — Ambulatory Visit: Payer: Self-pay | Admitting: Hematology and Oncology

## 2005-06-21 ENCOUNTER — Ambulatory Visit: Payer: Self-pay | Admitting: Hematology and Oncology

## 2005-07-20 LAB — COMPREHENSIVE METABOLIC PANEL
ALT: <8 U/L (ref 0–40)
AST: 11 U/L (ref 0–37)
Albumin: 4 g/dL (ref 3.5–5.2)
Alkaline Phosphatase: 79 U/L (ref 39–117)
BUN: 12 mg/dL (ref 6–23)
CO2: 24 mEq/L (ref 19–32)
Calcium: 9 mg/dL (ref 8.4–10.5)
Chloride: 109 mEq/L (ref 96–112)
Creatinine, Ser: 1 mg/dL (ref 0.4–1.2)
Glucose, Bld: 93 mg/dL (ref 70–99)
Potassium: 4.2 mEq/L (ref 3.5–5.3)
Sodium: 142 mEq/L (ref 135–145)
Total Bilirubin: 0.6 mg/dL (ref 0.3–1.2)
Total Protein: 6.6 g/dL (ref 6.0–8.3)

## 2005-07-20 LAB — CBC WITH DIFFERENTIAL/PLATELET
BASO%: 0.3 % (ref 0.0–2.0)
Basophils Absolute: 0 10*3/uL (ref 0.0–0.1)
EOS%: 0.7 % (ref 0.0–7.0)
Eosinophils Absolute: 0 10*3/uL (ref 0.0–0.5)
HCT: 39.1 % (ref 34.8–46.6)
HGB: 13.1 g/dL (ref 11.6–15.9)
LYMPH%: 24.8 % (ref 14.0–48.0)
MCH: 29.1 pg (ref 26.0–34.0)
MCHC: 33.6 g/dL (ref 32.0–36.0)
MCV: 86.7 fL (ref 81.0–101.0)
MONO#: 0.2 10*3/uL (ref 0.1–0.9)
MONO%: 3.9 % (ref 0.0–13.0)
NEUT#: 4.2 10*3/uL (ref 1.5–6.5)
NEUT%: 70.3 % (ref 39.6–76.8)
Platelets: 271 10*3/uL (ref 145–400)
RBC: 4.51 10*6/uL (ref 3.70–5.32)
RDW: 18 % — ABNORMAL HIGH (ref 11.3–14.5)
WBC: 5.9 10*3/uL (ref 3.9–10.0)
lymph#: 1.5 10*3/uL (ref 0.9–3.3)

## 2005-07-20 LAB — PROTIME-INR
INR: 1.9 (ref 2.00–3.50)
Protime: 16.9 Seconds (ref 10.6–13.4)

## 2005-12-28 ENCOUNTER — Encounter (INDEPENDENT_AMBULATORY_CARE_PROVIDER_SITE_OTHER): Payer: Self-pay | Admitting: *Deleted

## 2005-12-28 ENCOUNTER — Ambulatory Visit (HOSPITAL_BASED_OUTPATIENT_CLINIC_OR_DEPARTMENT_OTHER): Admission: RE | Admit: 2005-12-28 | Discharge: 2005-12-28 | Payer: Self-pay | Admitting: Surgery

## 2009-11-07 ENCOUNTER — Encounter: Payer: Self-pay | Admitting: Emergency Medicine

## 2009-11-07 ENCOUNTER — Emergency Department (HOSPITAL_COMMUNITY): Admission: EM | Admit: 2009-11-07 | Discharge: 2009-11-08 | Payer: Self-pay | Admitting: Emergency Medicine

## 2009-11-07 IMAGING — CT CT HEAD W/O CM
1 series · 15 of 30 positions shown, 19 images · non-contrast
Comparison: None.

CLINICAL DATA: Code stroke.  Mental status changes.

CT HEAD WITHOUT CONTRAST
TECHNIQUE: Contiguous axial images were obtained from the base of
the skull through the vertex without contrast.

[Series 2: headseq 4.8 h45s · axial · 0.39mm/px · z∈[-137,-6]mm · 15 of 30 slices shown, 19 images]
[im 2/30  brain]
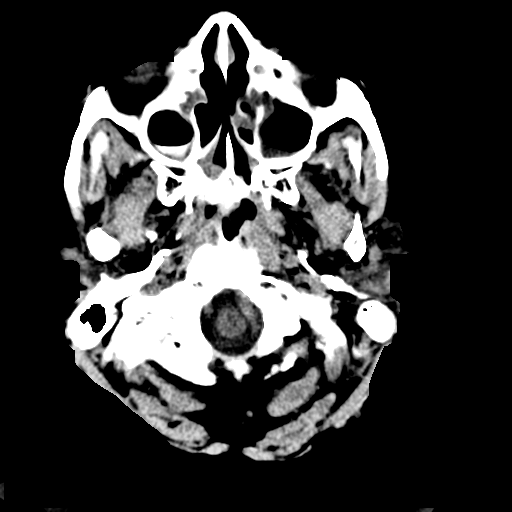
[im 2/30  bone]
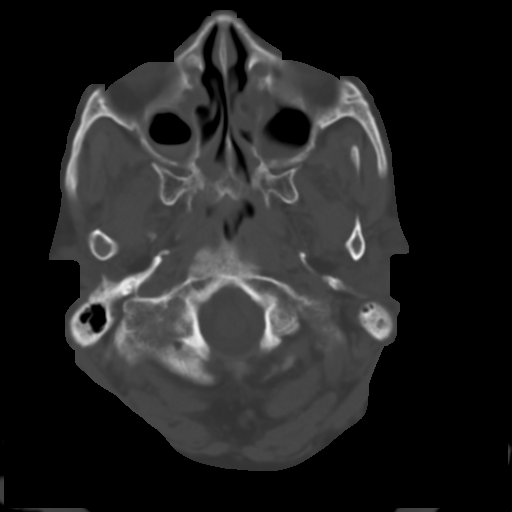
[im 4/30  brain]
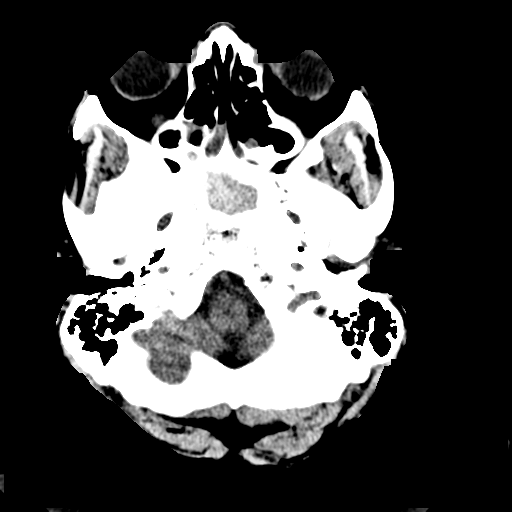
[im 6/30  brain]
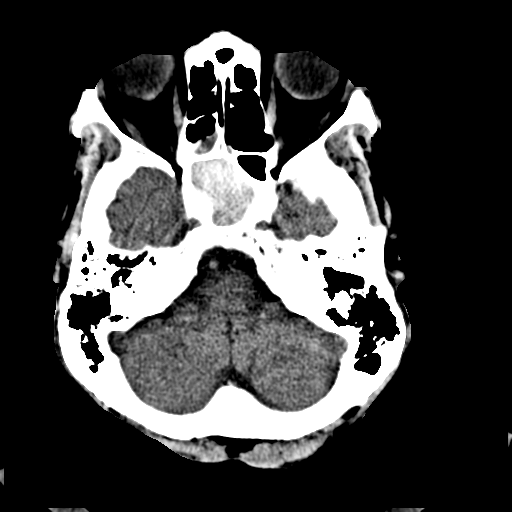
[im 8/30  brain]
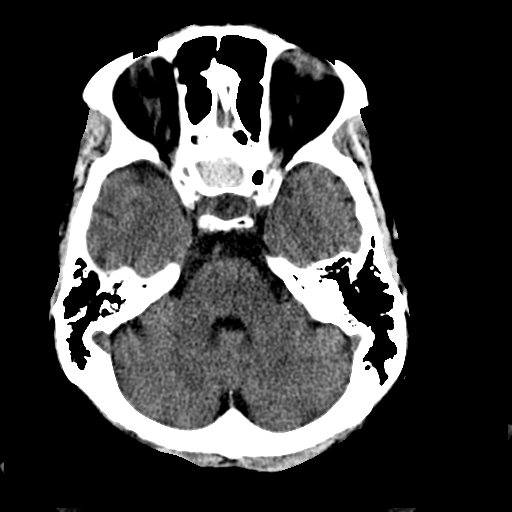
[im 10/30  brain]
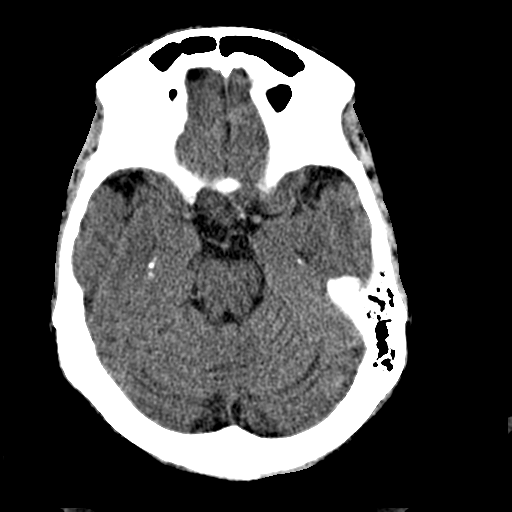
[im 10/30  bone]
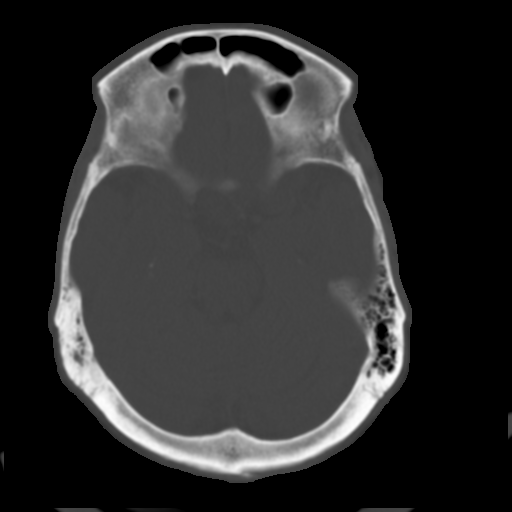
[im 12/30  brain]
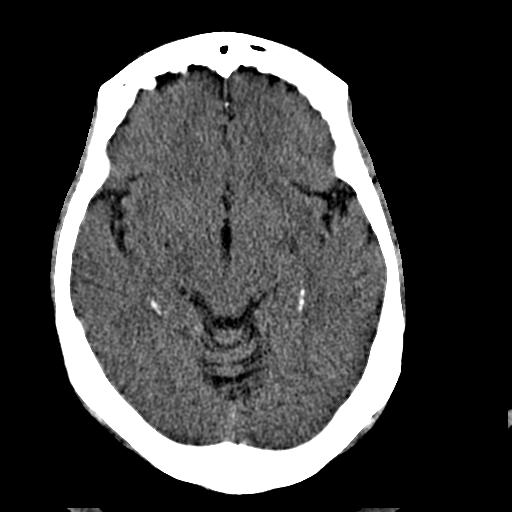
[im 14/30  brain]
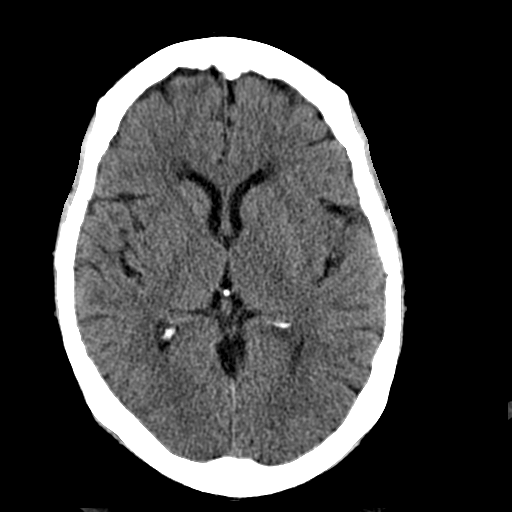
[im 16/30  brain]
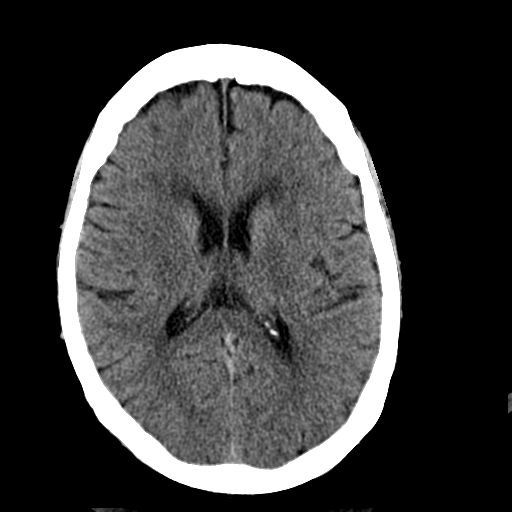
[im 17/30  brain]
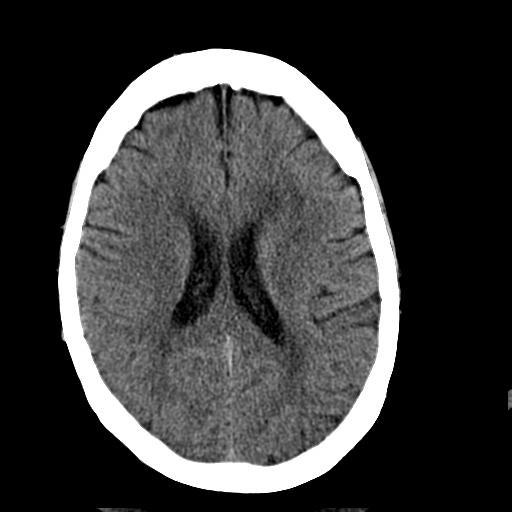
[im 17/30  bone]
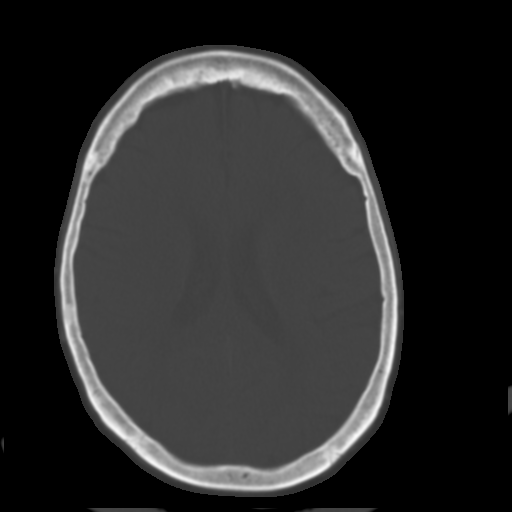
[im 19/30  brain]
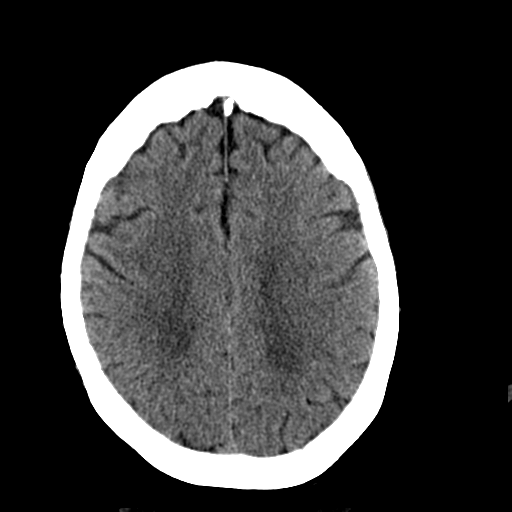
[im 21/30  brain]
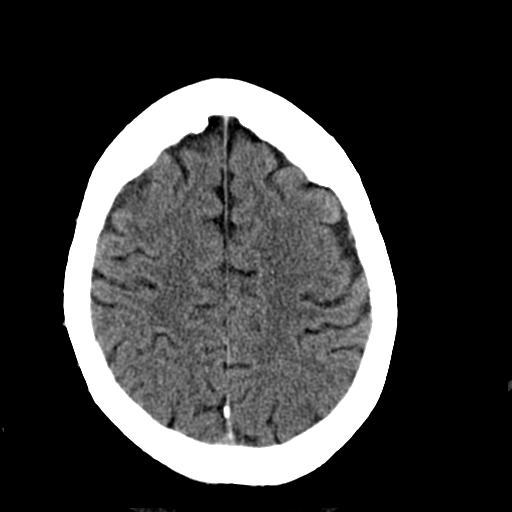
[im 23/30  brain]
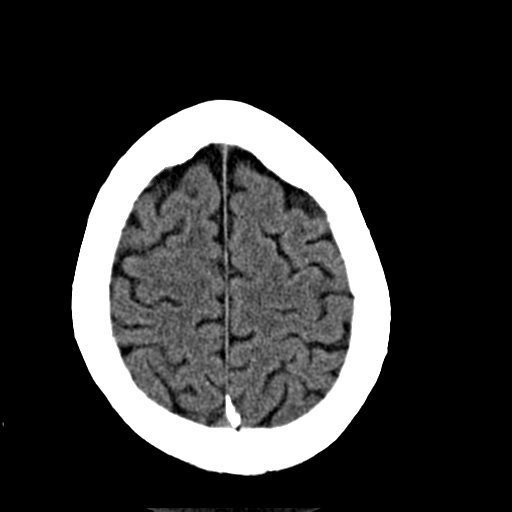
[im 25/30  brain]
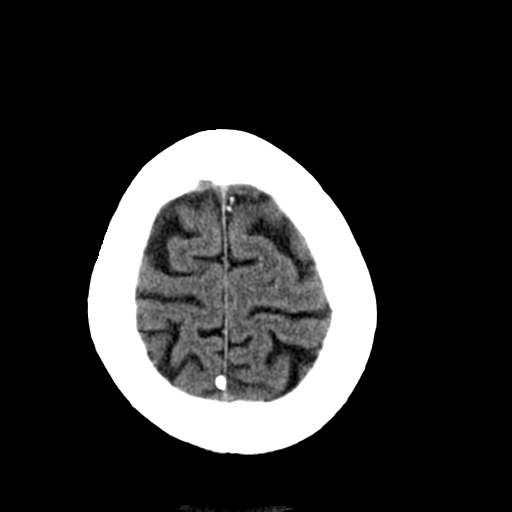
[im 25/30  bone]
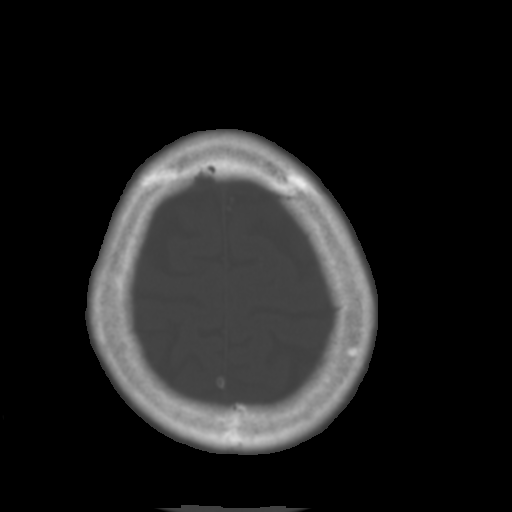
[im 27/30  brain]
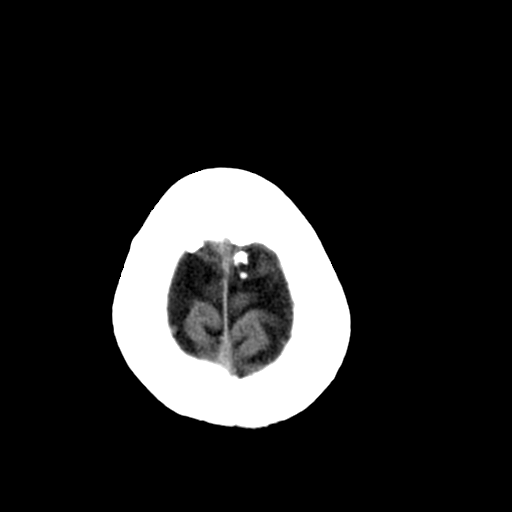
[im 29/30  brain]
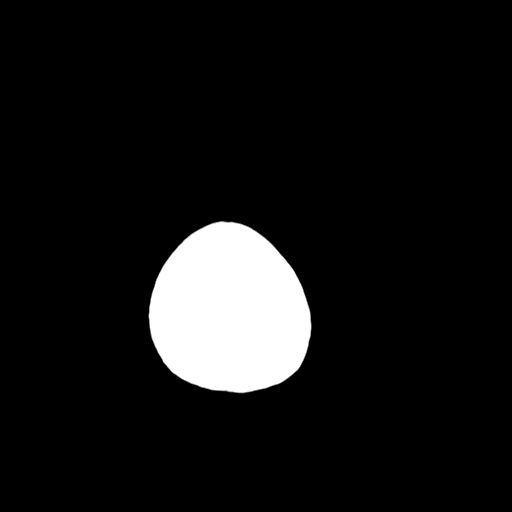

[15 of 30 positions shown; findings below may reference images not displayed]

FINDINGS: There is no evidence for acute hemorrhage, hydrocephalus,
mass lesion, or abnormal extra-axial fluid collection.  No definite
CT evidence for acute infarction.  Patchy low attenuation in the
deep hemispheric and periventricular white matter is nonspecific,
but likely reflects chronic microvascular ischemic demyelination.

Sphenoid sinuses are asymmetric.  The right sphenoid sinus is
completely opacified with high attenuation material.  There is
circumferential mucosal thickening within the visualized portions
of the upper maxillary sinuses and air fluid levels are seen in the
maxillary sinuses bilaterally.  The frontal sinuses and mastoid air
cells are clear bilaterally.
IMPRESSION: No acute intracranial abnormality.

Chronic small vessel white matter disease.

Complete opacification of the right sphenoid sinus with acute on
chronic sinus disease in both maxillary sinuses.  The increased
density in the right sphenoid sinus is probably related to
inspissated mucous.  Paranasal sinus infection or even neoplasm
could present with similar features and if there is clinical
suspicion, MRI may prove helpful to further evaluate.

## 2009-11-07 IMAGING — CR DG CHEST 1V
1 series · 1 of 1 positions shown · non-contrast
Comparison: [DATE]

CLINICAL DATA: Altered mental status.

CHEST - 1 VIEW

[view not recorded]
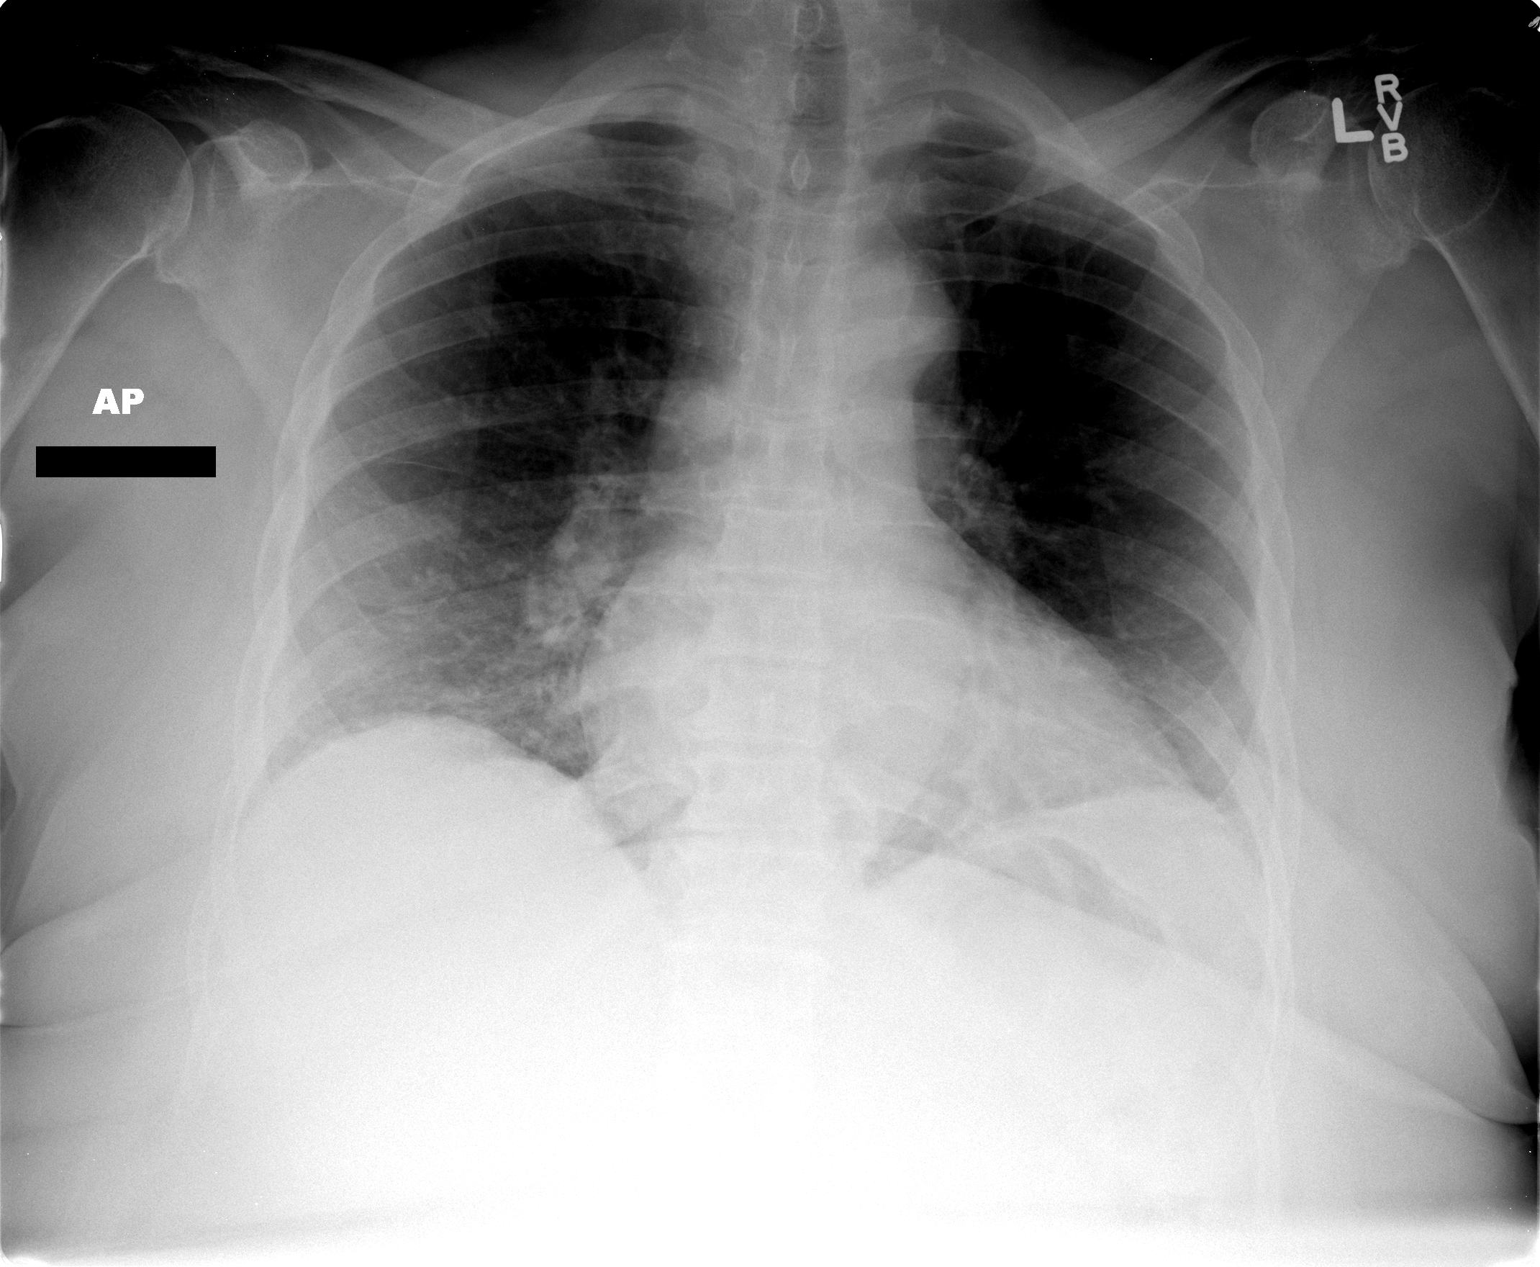

[1 of 1 positions shown; findings below may reference images not displayed]

FINDINGS: Lung volumes are low.  No focal airspace consolidation or
pulmonary edema.  There is some minimal atelectasis at the bases.
Interstitial markings are diffusely coarsened with chronic
features. Cardiopericardial silhouette is at upper limits of normal
for size. Imaged bony structures of the thorax are intact.
IMPRESSION: Low volume film with borderline cardiomegaly and mild chronic
interstitial coarsening.

## 2010-07-03 LAB — DIFFERENTIAL
Basophils Absolute: 0 10*3/uL (ref 0.0–0.1)
Basophils Relative: 1 % (ref 0–1)
Eosinophils Absolute: 0.5 10*3/uL (ref 0.0–0.7)
Eosinophils Relative: 6 % — ABNORMAL HIGH (ref 0–5)
Lymphocytes Relative: 29 % (ref 12–46)
Lymphs Abs: 2.5 10*3/uL (ref 0.7–4.0)
Monocytes Absolute: 0.7 10*3/uL (ref 0.1–1.0)
Monocytes Relative: 9 % (ref 3–12)
Neutro Abs: 4.6 10*3/uL (ref 1.7–7.7)
Neutrophils Relative %: 55 % (ref 43–77)

## 2010-07-03 LAB — POCT I-STAT, CHEM 8
BUN: 18 mg/dL (ref 6–23)
Calcium, Ion: 1.14 mmol/L (ref 1.12–1.32)
Chloride: 105 mEq/L (ref 96–112)
Creatinine, Ser: 1.3 mg/dL — ABNORMAL HIGH (ref 0.4–1.2)
Glucose, Bld: 107 mg/dL — ABNORMAL HIGH (ref 70–99)
HCT: 45 % (ref 36.0–46.0)
Hemoglobin: 15.3 g/dL — ABNORMAL HIGH (ref 12.0–15.0)
Potassium: 3.9 mEq/L (ref 3.5–5.1)
Sodium: 139 mEq/L (ref 135–145)
TCO2: 27 mmol/L (ref 0–100)

## 2010-07-03 LAB — URINALYSIS, ROUTINE W REFLEX MICROSCOPIC
Bilirubin Urine: NEGATIVE
Glucose, UA: NEGATIVE mg/dL
Hgb urine dipstick: NEGATIVE
Ketones, ur: NEGATIVE mg/dL
Nitrite: NEGATIVE
Protein, ur: NEGATIVE mg/dL
Specific Gravity, Urine: 1.021 (ref 1.005–1.030)
Urobilinogen, UA: 1 mg/dL (ref 0.0–1.0)
pH: 5.5 (ref 5.0–8.0)

## 2010-07-03 LAB — CBC
HCT: 40.5 % (ref 36.0–46.0)
Hemoglobin: 14 g/dL (ref 12.0–15.0)
MCH: 31.6 pg (ref 26.0–34.0)
MCHC: 34.5 g/dL (ref 30.0–36.0)
MCV: 91.7 fL (ref 78.0–100.0)
Platelets: 191 10*3/uL (ref 150–400)
RBC: 4.42 MIL/uL (ref 3.87–5.11)
RDW: 14.7 % (ref 11.5–15.5)
WBC: 8.4 10*3/uL (ref 4.0–10.5)

## 2010-07-03 LAB — URINE CULTURE
Colony Count: NO GROWTH
Culture: NO GROWTH

## 2010-07-03 LAB — POCT CARDIAC MARKERS
CKMB, poc: 1.2 ng/mL (ref 1.0–8.0)
Myoglobin, poc: 62.6 ng/mL (ref 12–200)
Troponin i, poc: <0.05 ng/mL (ref 0.00–0.09)

## 2010-07-03 LAB — PROTIME-INR
INR: 1 (ref 0.00–1.49)
Prothrombin Time: 13.1 seconds (ref 11.6–15.2)

## 2010-09-03 NOTE — Consult Note (Signed)
NAMEJENISA, [name] NO.:  [id number]   MEDICAL RECORD NO.:  [id number]          PATIENT TYPE:  INP   LOCATION:  5019                         FACILITY:  [facility]   PHYSICIAN:  [name], M.D.     DATE OF BIRTH:  18-May-1928   DATE OF CONSULTATION:  01/11/2005  DATE OF DISCHARGE:                                   CONSULTATION   PRIMARY CARE PHYSICIAN:  [name], M.D.   REASON FOR CONSULTATION:  Declining urine output, acute renal insufficiency.   HISTORY OF PRESENT ILLNESS:  The patient is a very pleasant, [name] who is status post right total hip arthroplasty with  developing increase in creatinine on today's labs as compared to 5 days ago  preop; also she was noted to have decreased urine output only 250 mL during  the last 12 hours.  On talking with the patient she complains of pain at the  surgical site, some nausea, but no vomiting.  She has no other complaints.  No dysuria, no hematuria, no abdominal or back pain.   REVIEW OF SYSTEMS:  CONSTITUTIONAL:  No symptoms of fevers, chills, or night  sweats.  No weight changes.  GI:  Positive for nausea, but no vomiting, no  diarrhea, no constipation, no abdominal pain.  GU:  No dysuria or hematuria,  or retention.  CARDIOPULMONARY:  No chest pain, no shortness of breath, no  orthopnea, no PND, no cough.   PAST MEDICAL HISTORY:  1.  Hypertension.  2.  Severe osteoarthritis.   PAST SURGICAL HISTORY:  1.  Hysterectomy.  2.  Hemorrhoidectomy.  3.  Right total hip arthroplasty.   FAMILY HISTORY:  Positive for diabetes and CHF in her mother who died at 64  as a result of complications of these diseases.  Her dad died from end-stage  renal disease in his 30s.   SOCIAL HISTORY:  She smokes 1/2 pack a day and has been doing so for 12  years, occasional alcohol, no drugs.  She is a retired Electronics engineer.   HOME MEDICATIONS:  1.  Celebrex 200 mg p.o. mg p.o. b.i.d.  2.  Diovan/HCTZ  160/25 one tablet p.o. daily.   ALLERGIES:  PENICILLIN and CODEINE.   PHYSICAL EXAMINATION:  GENERAL:  The patient is alert and oriented x3 in no  acute distress.  VITAL SIGNS:  Temperature 98.9, heart rate 86, respiratory rate 20, blood  pressure 110/56.  HEENT:  Atraumatic, normocephalic.  Extraocular movements intact  bilaterally.  NECK:  No lymphadenopathy, no thyromegaly, no JVD.  CHEST:  Clear to auscultation bilaterally.  No wheezing, rhonchi, or rales.  HEART:  Regular rate and rhythm.  No murmurs.  ABDOMEN:  Soft, nontender, nondistended.  Normoactive bowel sounds.  EXTREMITIES:  No cyanosis of clubbing.   STUDIES:  CBC showed hemoglobin of 10, which is down from 14 preop on  September 20.  Platelets and white count are within normal limits.   Electrolytes were all normal except for elevated creatinine at 2.2 jumping  from 1 on preop labs.  BUN is 17.   ASSESSMENT AND  PLAN:  1.  Acute renal failure.  Most likely as a result of fluid shift from the      operation on top of being on a combination of Celebrex and Diovan.  I      suspect that this problem will be temporary.  For the time being I am      going to hold her antihypertensives and increase her IV fluids and      recheck the creatinine and BUN level in the morning, monitor her urine      output and obtain renal ultrasound.  Unfortunately the patient received      all of her antihypertensives, today, already; so the effect of these      measures may take longer to appear.  2.  Hypertension.  Currently the patient is normal to hypotensive.  I would      allow her blood pressure to be up to systolic of 120-150 without      problems. I am going to hold the medicine as above.  3.  Arthritis pain and surgical site pain. At this point I would prefer to      use Tylenol plus narcotics and avoid NSAIDS.  4.  Postop anemia.  This should be stable at this point, and will recheck      CBC in the morning to confirm this.    Otherwise the patient is stable.  Thank you for the opportunity to take care  of this patient.  We will follow along.      [name], M.D.  Electronically Signed     SA/MEDQ  D:  01/11/2005  T:  01/12/2005  Job:  [id number]   cc:   [name]. [name], M.D.  Fax: [phone]   [name], M.D.  Fax: [phone]

## 2010-09-03 NOTE — H&P (Signed)
[name], [name] NO.:  [id number]   MEDICAL RECORD NO.:  [id number]          PATIENT TYPE:  INP   LOCATION:                               FACILITY:  [facility]   PHYSICIAN:  Mila Homer. Sherlean Foot, M.D. DATE OF BIRTH:  Aug 23, 1928   DATE OF ADMISSION:  01/10/2005  DATE OF DISCHARGE:                                HISTORY & PHYSICAL   CHIEF COMPLAINT:  Right hip pain.   HISTORY OF PRESENT ILLNESS:  [name] is a 77 year old African-American  female with right hip pain for approximately five to six years.  Pain now is  constant.  Pain that she describes is achy in nature.  Patient's right hip  does give way at times causing her to fall.  She does have waking pain.  She  uses no assistive devices.  She uses NSAIDs to manage her pain which give  her little, if any, relief.  Overall, patient's pain is getting worse.  She  has had no injections into the right hip.  Pain does affect her activities  of daily living.   X-rays of the right hip show joint space narrowing with marginal osteophytes  and slight subchondral cystic changes consistent with moderately severe  osteoarthritis.   ALLERGIES:  PENICILLIN caused her to black out.  CODEINE causes  hallucinations.   MEDICATIONS:  1.  Celebrex 200 mg one p.o. b.i.d.  2.  Diovan/HCTZ 160/25 mg one p.o. daily.   PAST MEDICAL HISTORY:  Positive for hypertension.  Patient denies any  cardiac, respiratory, or diabetes mellitus.   PAST SURGICAL HISTORY:  1.  Hysterectomy.  2.  Hemorrhoidectomy.   Patient denies any complications or any blood transfusions with either  procedure.   SOCIAL HISTORY:  Patient smokes approximately 10 cigarettes a day and has  done so for 12 years.  She uses alcohol occasionally.  She is widowed.  She  has three grown children.  She lives in a one-story home with one step to  the usual entrance.  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  Primary  care physician is Dr. Parke Simmers.  Phone number  is [phone].   FAMILY HISTORY:  Patient's mother deceased age 84 due to complications of  heart failure and diabetes mellitus.  Father deceased late 57s due to renal  failure.  She has one brother who is deceased age 9 due to lung cancer, the  other age 70 due to prostate cancer.  She has otherwise seven half-brothers  and sisters which she is unsure about their health.   REVIEW OF SYSTEMS:  No recent cold, cough, flu-like symptoms.  She denies  any chest pain, shortness of breath, PND, orthopnea.  She has partial on  top, full bottom denture.  She wears glasses for reading.  She denies any GI  or GU symptoms.  She does not have a living will nor a power of attorney.  Otherwise, the review of systems is negative and noncontributory.   PHYSICAL EXAMINATION:  GENERAL:  Patient is a well-developed, well-nourished  female who walks with a limp.  Patient uses  no assistive devices.  Patient's  mood and affect are appropriate.  She talks easily with examiner.  VITAL SIGNS:  Height 5 feet 8-1/2 inches, weight 220 pounds.  Respiratory  rate 16, blood pressure 138/80, pulse 80, temperature 96.8.  CARDIAC:  Regular rate and rhythm.  No murmurs, rubs, or gallops noted.  LUNGS:  Clear to auscultation bilaterally.  No wheezing, rhonchi, or rales.  ABDOMEN:  Soft, nontender.  Bowel sounds good.  Slight obesity.  HEENT:  Head is normocephalic, atraumatic without frontal or maxillary sinus  tenderness to palpation.  Conjunctiva is pink.  PERRLA.  Sclera is non-  icteric.  EOMs are intact.  There are no visible external ear deformities.  TMs pearly and gray bilaterally.  Nose:  Nasal septum midline.  Nasal mucosa  pink and moist without polyps.  Buccal mucosa is pink and moist.  Pharynx  without erythema or exudate.  Tongue and uvula midline.  NECK:  Carotids are 2+ without bruits.  She has no tenderness with palpation  along the cervical column.  She has full range of motion cervical spine  without  pain.  Trachea is midline.  No lymphadenopathy noted.  BREASTS:  Deferred at this time.  GENITALIA:  Deferred at this time.  URINARY:  Deferred at this time.  RECTAL:  Deferred at this time.  NEUROLOGIC:  Patient is alert and oriented x3.  Cranial nerves III-XII are  grossly intact.  Deep tendon reflexes at the patella and ankles are 2+  throughout.  Lower extremity strength testing reveals 5/5 strength  throughout.  MUSCULOSKELETAL:  Upper extremities are equal and symmetric in size and  shape.  Patient had full range of motion of the shoulders, elbows, and  wrists without pain.  The radial pulses are 2+ bilaterally.  EXTREMITIES:  Lower extremities:  Hips:  Left hip external rotation 35  degrees, internal rotation 30 degrees.  Right hip:  External rotation is 35,  internal 20 degrees.  Patient has pain with forced internal rotation of the  right hip.  Bilateral knees:  Left knee 0-115 degrees, right knee 0-115  degrees.  No effusion.  No edema is noted in either knee.  No tenderness  with palpation along the joint line of either knee.  Does have some crepitus  with passive range of motion of the right knee.  Otherwise, the lower  extremities are non-edematous.  Dorsal pedal pulses are 2+ bilaterally and  patient has good sensation to light touch in toes bilaterally.   IMPRESSION:  1.  Severe end-stage osteoarthritis right hip.  2.  Hypertension.   PLAN:  Patient is to be admitted to [facility] on January 10, 2005 to undergo a right total hip arthroplasty.  Prior to surgery patient  will undergo all preoperative laboratories and testing and will receive  surgical clearance from her primary care physician, Dr. Parke Simmers.      Richardean Canal, P.A.    ______________________________  Mila Homer. Sherlean Foot, M.D.    GC/MEDQ  D:  01/04/2005  T:  01/05/2005  Job:  [id number]

## 2010-09-03 NOTE — Discharge Summary (Addendum)
[name], [name] NO.:  [id number]   MEDICAL RECORD NO.:  [id number]          PATIENT TYPE:  INP   LOCATION:  5019                         FACILITY:  [facility]   PHYSICIAN:  Mila Homer. Sherlean Foot, M.D. DATE OF BIRTH:  1929/04/07   DATE OF ADMISSION:  01/10/2005  DATE OF DISCHARGE:  01/14/2005                                 DISCHARGE SUMMARY   ADMISSION DIAGNOSES:  1.  End-stage osteoarthritis, right hip.  2.  Hypertension.   DISCHARGE DIAGNOSES:  1.  Status post right total hip arthroplasty.  2.  Acute blood loss anemia secondary to surgery requiring packed red blood      cells.  3.  Acute renal insufficiency, resolved.  4.  Postoperative hyponatremia and hypokalemia, resolved.   HISTORY OF PRESENT ILLNESS:  Ms. [name] is a very pleasant 77 year old  African American female with right hip pain for approximately five years.  The right hip pain is described as constant, aching pain. Her hip does give  away at times and causes her to fall. She has ____ QA MARKER: 77 ____. She   uses no assistive devices. She uses NSAIDs to manage her pain of which she  gets little relief if any. Overall, the patient is being gradually getting  worse. She has had no injections in the right hip. X-rays of the right hip  show joint space narrowing with marginal osteophytes and slight subchondral  cyst changes consistent with moderately severe osteoarthritis.   ALLERGIES:  1.  PENICILLIN causes her to black out.  2.  CODEINE causes hallucinations.   MEDICATIONS:  1.  Celebrex 200 mg one p.o. b.i.d.  2.  Diovan/hydrochlorothiazide 160/25 mg one p.o. daily.   PROCEDURE:  The patient was taken to the operating room on January 10, 2005, by Dr. Georgena Spurling, assisted by Richardean Canal, PA. The patient was  placed under general anesthesia and a right total hip arthroplasty was  performed. The following components were used: A size 14 femoral stem with a  32-mm diameter femoral  head, plus 0 mm neck length, a 56-mm outside diameter  acetabular cup, with a 32-mm liner. The patient tolerated the procedure  well, and returned to the recovery room in good and stable condition.   CONSULTATIONS:  The following consults were obtained while the patient was  hospitalized, PT, OT, and hospitalist, Dr. Elayne Guerin.   HOSPITAL COURSE:  Patient with poor pain control. On postoperative day one,  the patient was afebrile, vital signs stable. H&H 10.0 and 29.4.  The  patient's creatinine was elevated to 2.2, BUN 17. Urine input was 2570 and  output 600. A medicine consult was made for acute renal failure. It was felt  that this was most likely due to fluid shift on top of being on a  combination of Celebrex and Diovan. Antihypertensives were held and IV  fluids increased. Recheck of BUN to be done. Strict I&Os and renal  ultrasound was ordered. The patient's Celebrex was held and the patient's  pain was to be controlled with Tylenol and narcotics. On postoperative day  two, the patient denied  chest pain or shortness of breath. Positive nausea.  Pain was under fair control. The patient's T-max was 99.9 and otherwise  stable. H&H was 9.1 and 26.7.  The patient was hyponatremic with sodium of  133, potassium 3.3, hypokalemic. The patient's I&Os were not kept  adequately. The patient had 1000 mL of urine in Foley bag by the time of  morning rounds. IV fluids were decreased to 100 mL per hour due to  hypernatremia and hypokalemia. Potassium was replaced. The patient was still  hypotensive and blood pressures continued to be held. In regards to acute  blood loss anemia the patient remained asymptomatic.  A brain ultrasound was  pending.   On postoperative day three, the patient was afebrile with vital signs  stable, blood pressure 118/60 on morning rounds. The patient's H&H dropped  to 8.5 and 25.  BUN was 10 and creatinine 1.2 and acute renal insufficiency  was resolved. Hypokalemia  and hyponatremia resolved with a sodium of 137 and  a potassium of 3.8.  Due to the patient's acute blood loss anemia and the  fact that she was slightly lightheaded and feeling weak, she was transfused  two units of packed red blood cells. Renal ultrasound was negative.   On postoperative day four, the patient denied chest pain or shortness of  breath, had some nausea, tolerating diet.  She denied any dizziness. T-max  was 99.3.  Blood pressure remained stable at 118/60. Otherwise, vital signs  were stable. H&H was 10.2 and 29.3.  I&Os 2345 in and 2950 out.  The patient  was to work with physical therapy and if doing well could be discharged home  later that afternoon.  The patient did work for physical therapy and was  later discharged home in good and stable condition.   LABORATORY DATA:  Routine labs on admission revealed CBC with white count of  7300, hemoglobin 14.5, hematocrit 42.8, platelet count 244,000. Coags on  admission revealed all values within normal limits.  Routine chemistries on  admission revealed sodium of 138, potassium 4.4, chloride 105, bicarbonate  25, glucose 102, BUN 10, creatinine 1.0. Hepatic enzymes on admission, all  values within normal limits. Urinalysis on admission was negative. Urine  culture revealed 60,000 colonies per mL multiple species, re-collection if  clinical symptoms.   EKG dated January 05, 2005, showed normal sinus rhythm, heart rate of 71  beats per minute, PR interval 162 milliseconds, PRT axis 71, negative 1141.   Right hip complete on January 10, 2005, showed cross-table PA of the right  hip, nondiagnostic, due to patient's habitus. The coronal views a right hip  total arthroplasty without immediate complicating features. Subcutaneous air  and surgical skin staples were noted.   Urinary tract ultrasound performed on January 12, 2005, showed no acute  findings by ultrasound.   DISCHARGE INSTRUCTIONS:   MEDICATIONS: 1.   Diovan/hydrochlorothiazide 160/25 one tablet daily. The patient is not      take if systolic pressure less than 120.  2.  The patient is to stop Celebrex, start the following  medications,      Lovenox 40 mg one injection daily at 10 a.m. for nine days.  3.  Percocet 5/325 mg one to two tablets q.4-6h. p.r.n. pain.  4.  Robaxin 500 mg one tablet q.6-8h. p.r.n. spasm.  5.  No aspirin while on Lovenox.  6.  Stool softener and laxative as needed.   DIET:  No restrictions.   ACTIVITY:  The patient is  weightbearing as tolerated with rolling walker.   WOUND CARE:  The patient is to keep wound clean and dry, to change the  dressing daily, to call our office if temperature greater than 101.5, foul  smelling drainage, or pain not controlled.   REFERRAL:  Home health PT with [facility].   SPECIAL INSTRUCTIONS:  Abduction pillow at night sleeping.   FOLLOWUP:  The patient needs to follow up with Dr. Sherlean Foot 10 days from  discharge. The patient is to call the office at [phone] for an appointment.  The patient needs follow up with Dr. [name] in two to three weeks for  blood pressure check. The patient is to call her office for an appointment.      Richardean Canal, P.A.    ______________________________  Mila Homer. Sherlean Foot, M.D.    GC/MEDQ  D:  03/25/2005  T:  03/25/2005  Job:  [id number]   cc:   [name], M.D.  Fax: [phone]

## 2010-09-03 NOTE — Discharge Summary (Signed)
[name], LIE NO.:  [id number]   MEDICAL RECORD NO.:  [id number]          PATIENT TYPE:  INP   LOCATION:  3711                         FACILITY:  [facility]   PHYSICIAN:  Lonia Blood, M.D.      DATE OF BIRTH:  [birth year]   DATE OF ADMISSION:  02/21/2005  DATE OF DISCHARGE:                                 DISCHARGE SUMMARY   PRIMARY CARE PHYSICIAN:  [name], M.D.   DISCHARGE DIAGNOSES:  1.  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  2.  Hypertension.  3.  Anemia secondary to blood loss.  4.  Status post right hip surgery with evacuation of hematoma.   DISCHARGE MEDICATIONS:  To be stated at the time of discharge.   PROCEDURES PERFORMED:  1.  Insertion of inferior vena cava filter on 02/24/2005 by interventional      radiology.  2.  Surgical intervention performed on 02/24/2005 by Dr. Sherlean Foot.  3.  The patient had the above question of hematoma, she had I&D.  Please see      Dr. Tobin Chad note.  4.  Infection of intravenous inferior venacavogram from 02/24/2005.   DISPOSITION:  The patient has awakened and discharged home with home health.  She will need to have V.A.C. dressing at home; however, her insurance is  unable to pay for V.A.C. dressing at home, hence she is still in the  hospital getting some V.A.C. treatment.  She will be discharged once she is  stable from that point of view.   CONSULTANT:  Mila Homer. Sherlean Foot, M.D., orthopedic surgery; D. Oley Balm,  M.D., interventional radiology.   BRIEF HISTORY AND PHYSICAL:  Please refer to dictated history and physical  by Dr. Deanne [name].  In short, however, the patient is a 77 year old African-  American female with a history of hypertension, osteoarthritis, and had a  total hip replacement on January 05, 2005.  She was doing fine and went  home on subcutaneous Lovenox for about a week.  The patient, however,  returned about 3 weeks later with  significant pain in the right hip and she  was diagnosed with hematoma on the surgical site.  She underwent surgical  removal of the hematoma by Dr. Sherlean Foot on 02/14/2005.  The patient was  discharged home.  However, prior to returning she had severe pain in her  calf where she had lower extremity ultrasound performed that showed  bilateral DVT.  Hence, she was seen in further workup.   HOSPITAL COURSE BY PROBLEMS:  Problem #1:  BILATERAL DVT.  The patient  was  initially started on Coumadin with no heparin secondary to her recent  hematoma.  After a thorough discussion with the patient, Dr. Sherlean Foot, and our  team; it was decided that the patient is not a good candidate for Coumadin  therapy.  Subsequently an IVC filter was placed.  The patient's recent  hematoma was the main reason.  The patient has been very much adamant on not  really wanting Coumadin.  He is so scared and depressed from  that point of  view, considering that Coumadin, in her own view, may be risky for her  especially since she bleed here.  Subsequently coumadin was discontinued and  currently the patient is being managed only with her IVC filter.  A decision  will be done if the patient becomes very much mobile, maybe the IVC filter  could be temporary, otherwise she will have a permanent filter.  She is  status post evacuation of hematoma.  The patient's wound is being taken care  of Dr. Sherlean Foot.  She is currently requiring a V.A.C. dressing.  On further  attempt to send her home with V.A.C.  has failed due to her insurance.  She  will continue to have V.A.C. dressing until the time that she is stable  enough to continue care at home.   Problem #2:  HYPERTENSION.  This is for the most part controlled on her home  medication.   Problem #3:  BLOOD LOSS ANEMIA.  Her hemoglobin has trended down from normal  when she first got here to 10.6 and gradually down to 10.4 at this point.  This is presumed to be blood loss anemia  secondary to a hematoma.  No  further workup is being done.   Problem #4:  DEPRESSION.  The patient seemed to have a depressed mood.  She  had no prior history of depression, however, we are observing her with some  family support.  If she turns out to be unstable, at some point, we will  probably start her on antidepressants prior to discharge.      Lonia Blood, M.D.  Electronically Signed     LG/MEDQ  D:  02/27/2005  T:  02/28/2005  Job:  [id number]   cc:   Mila Homer. Sherlean Foot, M.D.  Fax: [phone]

## 2010-09-03 NOTE — Op Note (Signed)
[name], [name] NO.:  [id number]   MEDICAL RECORD NO.:  [id number]          PATIENT TYPE:  AMB   LOCATION:  ENDO                         FACILITY:  [facility]   PHYSICIAN:  Anselmo Rod, M.D.  DATE OF BIRTH:  10/09/1928   DATE OF PROCEDURE:  12/17/2004  DATE OF DISCHARGE:                                 OPERATIVE REPORT   PROCEDURE PERFORMED:  Screening colonoscopy.   ENDOSCOPIST:  Anselmo Rod, M.D.   INSTRUMENT USED:  Olympus video colonoscope.   INDICATION FOR PROCEDURE:  A 77 year old African-American female undergoing  screening colonoscopy to rule out colonic polyps, masses, etc.   PREPROCEDURE PREPARATION:  Informed consent was procured from the patient.  The patient was fasted for eight hours prior to the procedure and prepped  with a bottle of magnesium citrate and a gallon of GoLYTELY the night prior  to the procedure.  The risks and benefits of the procedure, including a 10%  miss rate of cancer and polyps, were discussed with the patient as well.   PREPROCEDURE PHYSICAL:  VITAL SIGNS:  The patient had stable vital signs.  NECK:  Supple.  CHEST:  Clear to auscultation.  S1, S2 regular.  ABDOMEN:  Soft with normal bowel sounds.   DESCRIPTION OF PROCEDURE:  The patient was placed in the left lateral  decubitus position and sedated with Demerol and Versed.  Once the patient  was adequately sedate and maintained on low-flow oxygen and continuous  cardiac monitoring, the Olympus video colonoscope was advanced from the  rectum to the cecum.  The patient had a poor prep.  Multiple washes were  done.  There was evidence of pandiverticulosis throughout the colon, with  more prominent changes in the left colon.  No masses, polyps, erosions or  ulcerations were seen.  Small lesions could be missed.  The patient  tolerated the procedure well without complication.  Retroflexion in the  rectum revealed no masses.   IMPRESSION:  1.  Pandiverticulosis  with more prominent changes in the left colon.  2.  A large amount of residual stool in the colon, small lesions could be      missed.   RECOMMENDATIONS:  1.  Continue a high-fiber diet with liberal fluid intake.  2.  Repeat colonoscopy in the next 10 years unless the patient develops any      abnormal symptoms in the interim.  3.  Outpatient follow-up as the need arises in the future.      Anselmo Rod, M.D.  Electronically Signed     JNM/MEDQ  D:  12/17/2004  T:  12/18/2004  Job:  [id number]   cc:   [name], M.D.  [phone] [address]  [place]  [place] [address]  Fax: [phone]

## 2010-09-03 NOTE — Op Note (Signed)
[name], [name] NO.:  [id number]   MEDICAL RECORD NO.:  [id number]          PATIENT TYPE:  AMB   LOCATION:  DSC                          FACILITY:  [facility]   PHYSICIAN:  Currie Paris, M.D.DATE OF BIRTH:  April 11, 1929   DATE OF PROCEDURE:  12/28/2005  DATE OF DISCHARGE:                                 OPERATIVE REPORT   OFFICE MEDICAL RECORD NUMBER [id number].   PREOPERATIVE DIAGNOSIS:  Right breast mass.  Indeterminate core biopsy.   POSTOPERATIVE DIAGNOSIS:  Right breast mass.  Indeterminate core biopsy.   OPERATION:  Needle-guided excision, right breast mass.   SURGEON:  Dr. Jamey Ripa.   ANESTHESIA:  MAC.   CLINICAL HISTORY:  [name] is a 77-year lady who has an abnormality in  the right breast, about the 9 o'clock to 10 o'clock position.  Core biopsy  was inconclusive and excisional biopsy recommended.  The area was moderately  suspicious by mammography.   DESCRIPTION OF PROCEDURE:  The patient was seen in the holding area, and she  had no further questions.  We both confirmed that the right breast was the  operative side, and I marked it.  Dr. Yolanda Bonine had already placed her  guidewire and placed an X over on the skin overlying the area of the mass.   The patient was taken to the operating room, and after satisfactory IV  sedation, the right breast was prepped and draped.  The time-out occurred.   I infiltrated the area with 1% Xylocaine.  I made a curvilinear scission  directly over the X on the skin mark.  I divided a little of the  subcutaneous tissue and then mobilized into the wound the guidewire.   There was a palpable area of thickening, and the patient had developed some  hematoma post biopsy.  This was in the area that the guidewire went through,  so I just excised this mainly with cautery, until I had all this abnormal-  feeling tissue out.  This was sent for specimen mammography.   I infiltrated additional local and then closed the  incision with 3-0 Vicryl,  4-0 Monocryl subcuticular, and Dermabond.   The patient tolerated the procedure well, and there no operative  complications.  All counts were correct.      Currie Paris, M.D.  Electronically Signed     CJS/MEDQ  D:  12/28/2005  T:  12/29/2005  Job:  [id number]   cc:   Fleet Contras, M.D.  Jeralyn Ruths, M.D.

## 2010-09-03 NOTE — Op Note (Signed)
NAMECAROLEE, [name] NO.:  [id number]   MEDICAL RECORD NO.:  [id number]          PATIENT TYPE:  INP   LOCATION:  5019                         FACILITY:  [facility]   PHYSICIAN:  Mila Homer. Sherlean Foot, M.D. DATE OF BIRTH:  Oct 20, 1928   DATE OF PROCEDURE:  01/10/2005  DATE OF DISCHARGE:                                 OPERATIVE REPORT   PREOPERATIVE DIAGNOSIS:  Right hip osteoarthritis.   POSTOPERATIVE DIAGNOSIS:  Right hip osteoarthritis.   OPERATION PERFORMED:  Right total hip arthroplasty.   SURGEON:  Mila Homer. Sherlean Foot, M.D.   ASSISTANT:  Richardean Canal, P.A.   ANESTHESIA:  General.   INDICATIONS FOR PROCEDURE:  The patient is a 77 year old black female with  failure of conservative measures for osteoarthritis of the right hip.  Informed consent was obtained.   DESCRIPTION OF PROCEDURE:  The patient was laid supine and administered  general anesthesia.  Then placed in the right up left down lateral decubitus  position.  A curvilinear Southern type incision was made with a #10 blade  after sterile prep and drape.  A fresh 10 blade was used to incise the  fascia lata along the length of the incision and the Charnley retractor was  put in place.  Hemostasis was obtained with the cautery.  I then elevated  the anterior one third of the gluteus medius and anterior one half of the  vastus lateralis in a single sleeve of tissue and tied with three stay  sutures.  I then removed the anterior hip capsule with the cautery.  I then  used the cutting guide template marked out the neck cut and made two cuts  with one __________  segment being the femoral neck.  I removed the femoral  neck and the ball and did not have to dislocate the hip.  I then placed a  Hohman retractor anterior and posterior to the hip and removed the labrum  circumferentially.  At this point I then switched sides with my physician  assistant and reamed progressively up to 54 mm.  There was a cyst in the  medial wall.  I packed this with bone graft from the cheese grater reamer.  I then tamped a no holes, no spike FiberMesh cup.  I had excellent  stability.  I then snapped in 7 mm offset liner.  I then switched to the  back side of the patient.  I then externally rotated and dropped the foot  into a sterile pouch off the anterior side of the table.  I gained access to  the cut surfaces of the femoral neck and into the femoral canal.  I then  reamed sequentially up to 14 mm.  I then broached to size 14 and trialed to  multiple head sizes.  A 0 was the most stable head size and recreated limb  length and all sat very, very well.  I then copiously irrigated and put down  a 14 mm fully porous coated stem, tamped on a 0 head by 32 mm to a clean  Morse taper.  I took it through extreme range  of motion  and could not get  it to dislocate.  I then repaired the vastus lateralis, gluteus medius and  minimus sleeve through three drill holes in the greater trochanter.  I then  oversewed with figure-of-eight #2 Ethibond sutures.  I then closed the  fascia lata with interrupted #1 Vicryl figure-of-eight sutures and the deep  soft tissues using 2-0 Vicryl subcuticulars.  We then placed Steri-Strips.  Then dressed with Adaptic, 4 x 4s, ABDs and sterile Ioban drape.   COMPLICATIONS:  None.   DRAINS:  None.   ESTIMATED BLOOD LOSS:  300 mL.           ______________________________  Mila Homer. Sherlean Foot, M.D.     SDL/MEDQ  D:  01/10/2005  T:  01/11/2005  Job:  [id number]

## 2010-09-03 NOTE — H&P (Signed)
[name], [name] NO.:  [id number]   MEDICAL RECORD NO.:  [id number]          PATIENT TYPE:  INP   LOCATION:  3711                         FACILITY:  [facility]   PHYSICIAN:  Lonia Blood, M.D.       DATE OF BIRTH:  Apr 27, 1928   DATE OF ADMISSION:  02/21/2005  DATE OF DISCHARGE:                                HISTORY & PHYSICAL   PRIMARY CARE PHYSICIAN:  [name], M.D.   CHIEF COMPLAINT:  Calf pain.   HISTORY OF PRESENT ILLNESS:  [name] is a 77 year old African American  woman with a past medical history significant only for hypertension,  osteoarthritis, who had a total hip replacement on January 10, 2005. Her  postoperative course was uneventful and she was discharged home on  subcutaneous Lovenox for a week. She returned about three weeks later with  significant pain in the right hip and she was diagnosed with surgical site  hematoma. On February 14, 2005, she underwent surgical removal of the  hematoma by Dr. Sherlean Foot.  She was discharged on the same day from the  hospital. One day prior to admission she developed significant pain in her  calf and she called this morning the office of Dr. Sherlean Foot and she was set to  have an ultrasound to rule out DVT at  [facility]. She was found to  have bilateral deep venous thrombosis and she was referred to [place] for  admission.   PAST MEDICAL HISTORY:  1.  Hypertension.  2.  Osteoarthritis.  3.  Right total hip replacement.  4.  Status post cataract surgery.  5.  Status post hysterectomy.  6.  Status post hemorrhoidectomy.   HOME MEDICATIONS:  1.  Diovan/hydrochlorothiazide 160/25 mg one p.o. daily.  2.  Percocet 7.5/325 mg one p.o. q.6h. p.r.n. pain.  3.  Cipro one p.o. daily.  4.  Robaxin p.r.n. for muscle spasms.   SOCIAL HISTORY:  The patient is a current tobacco user. She smokes about a  third of a pack of cigarettes a day. She drinks alcohol occasionally. She is  widowed and she lives alone. She  has three grown children, two of them  living here in [place]. She is currently retired.   FAMILY HISTORY:  Her father died at an early age due to chronic renal  disease.  The patient's mother died recently at age 48 with complications of  diabetes and heart failure. The patient has one brother who is deceased at  age 60 due to lung cancer and one brother deceased at age 41 due to prostate  cancer. She has other seven brothers and sisters who are probably in good  health.   REVIEW OF SYSTEMS:  The patient wears glasses for reading. She denies any  chest pain, shortness of breath, coughing. She reports some chronic mild  constipation that resolves with laxative of choice. She had some mild nausea  with Robaxin and Percocet but no vomiting and no abdominal pain. All other  systems were negative.   PHYSICAL EXAMINATION:  VITAL SIGNS: Upon admission, temperature of 98.4,  pulse 79,  respirations 20, blood pressure 137/77.  GENERAL: A well-developed, well-nourished, a bit anxious, but in no acute  distress. She is alert and oriented to place, person, and time.  HEENT: Head is normocephalic and atraumatic. Eyes reveal pupils equal,  round, and reactive to light and accommodation. Conjunctiva pink. Sclerae  nonicteric. Extraocular movements intact. Nasal mucosa is pink and moist.  Buccal mucosa is without ulcerations. Pharynx is clear.  NECK: Without JVD. No carotid bruits. No palpable thyromegaly. Trachea is in  the midline.  RESPIRATORY EXAM: Clear to auscultation bilaterally without rhonchi,  wheezes, or crackles.  CARDIAC: Regular rate and rhythm without murmurs, rubs, or gallops.  ABDOMEN: Soft, nontender. Positive bowel sounds. Nondistended. Without any  hepatosplenomegaly.  EXTREMITIES: The right hip has tenderness to palpation. She also has  significant tenderness on palpation of the left calf. Pulses are present  bilaterally.  SKIN: Warm and dry. There is no palpable  lymphadenopathy.  PSYCHIATRIC: She has intact memory, affect, insight, and judgment.   LABORATORY DATA:  At the time of admission Doppler venous ultrasound shows a  right posterior tibial vein thrombosis and a left popliteal vein thrombosis.   ASSESSMENT/PLAN:  1.  Bilateral deep venous thrombosis. The patient is at high risk of      extension of the current popliteal deep venous thrombosis due to her      poor mobility. The most likely cause of her deep venous thrombosis is      repeat hospitalization for hip surgeries. The risks and benefits of      anticoagulation had been discussed with the patient and with Dr. Sherlean Foot.      At this point the plan is to start the patient on Coumadin alone, not to      start her on heparin or Lovenox. Aim for target INR of 2, closely      monitor for extension of the deep venous thrombosis. If further      extension into the common femoral vein is observed, then IVC filter      could be placed. We will also have to carefully monitor the patient for      bleeding at the site of the surgical procedure.  Plan is to admit the      patient to a telemetry bed and start her on Coumadin starting on the      night of admission.  2.  Hypertension. Will treat the patient with current medications.      Lonia Blood, M.D.  Electronically Signed     SL/MEDQ  D:  02/21/2005  T:  02/21/2005  Job:  [id number]   cc:   [name], M.D.  Fax: [phone]   Mila Homer. Sherlean Foot, M.D.  Fax: [phone]

## 2010-09-03 NOTE — Op Note (Signed)
NAMEAAMNA, MALLOZZI NO.:  [id number]   MEDICAL RECORD NO.:  [id number]          PATIENT TYPE:  AMB   LOCATION:  SDS                          FACILITY:  [facility]   PHYSICIAN:  Mila Homer. Sherlean Foot, M.D. DATE OF BIRTH:  [birth year]   DATE OF PROCEDURE:  02/14/2005  DATE OF DISCHARGE:                                 OPERATIVE REPORT   PREOPERATIVE DIAGNOSIS:  Right hip hematoma, status post total hip  arthroplasty.   POSTOPERATIVE DIAGNOSIS:   OPERATION PERFORMED:   SURGEON:  Mila Homer. Sherlean Foot, M.D.   ASSISTANT:  Legrand Pitts. Duffy, P.A.   ANESTHESIA:  General.   INDICATIONS FOR PROCEDURE:  The patient is a 77 year old who had a hip  replacement one month ago.  She developed a hematoma.  This began to drain  and necessitated surgical evacuation.  Informed consent was obtained.   DESCRIPTION OF PROCEDURE:  The patient was laid supine and administered  general anesthesia.  She was placed in the lateral position, right hip up,  left hip down with an axillary roll in the axilla.  The right hip was  prepped and draped in the usual sterile fashion using Betadine scrub and  paint.  I then dressed the hip, then made an incision through the old  incision covering about two thirds.  I then evacuated at least 500 mL of  serous fluid.  I then removed the Vicryl sutures in the subcuticular layer.  I then lavaged with 1000 mL of pulse lavage and had good bleeding  granulation tissue.  I then layered the closure with 0 PDS, 2-0 PDS and skin  staples.   DRAINS:  None.   COMPLICATIONS:  None.           ______________________________  Mila Homer. Sherlean Foot, M.D.     SDL/MEDQ  D:  02/14/2005  T:  02/14/2005  Job:  [id number]

## 2010-09-03 NOTE — Discharge Summary (Signed)
NAMELOWANA, HABLE NO.:  [id number]   MEDICAL RECORD NO.:  [id number]          PATIENT TYPE:  INP   LOCATION:  3711                         FACILITY:  [facility]   PHYSICIAN:  Danae Chen, M.D.DATE OF BIRTH:  1928/12/21   DATE OF ADMISSION:  02/21/2005  DATE OF DISCHARGE:  03/03/2005                                 DISCHARGE SUMMARY   ADDENDUM:   DISCHARGE DIAGNOSES:  1.  Bilateral deep vein thrombosis, status post inferior vena cava filter      placement on February 24, 2005, a [place] inferior vena cava filter      placement per D. Oley Balm, M.D.  2.  Status post right hip surgery with evacuation of hematoma per Mila Homer.      Sherlean Foot, M.D., orthopedics.  3.  Infection of intravenous inferior vena cavogram from February 24, 2005.  4.  Anemia secondary to blood loss.  5.  Hypertension.   DISCHARGE MEDICATIONS:  The patient is to resume her home medications, which  include:  1.  Diovan/hydrochlorothiazide 160/25 mg one p.o. daily.  2.  Cipro 500 mg p.o. daily to continue for another seven days.  3.  OxyContin SR 10 mg one p.o. q.12h. x7 days.  4.  Percocet 5/325 mg one to two tablets every four to six hours as needed      for pain.   FOLLOW-UP:  The patient was to follow up with Dr. Sherlean Foot, call his office for  an appointment on March 08, 2005, phone number [phone].  Dr. [name], follow up on March 09, 2005, at 9:30 a.m., phone number [phone].   DISCHARGE INSTRUCTIONS:  For wound care, the patient is to apply her Wound-  V.A.C. to the right hip until two days of no drainage, then discontinue.   Please refer to the discharge summary of Lonia Blood, M.D., job [phone], for  details of hospital course by problems.   BRIEF ADDENDUM:  Covering from November 13 through November 16:  The patient  remained in the hospital and continued to receive her Wound-V.A.C.  There  was an issue with obtaining the home Wound-V.A.C., which has been  resolved.  The patient will be going home with a Wound-V.A.C.  Her drainage is  decreasing but it will be applied until there is no drainage for  approximately two days.  The patient remains with an IVC filter.  It was  deemed that she was not a good candidate for Coumadin therapy for her  bilateral deep vein thromboses.  This was in discussion with the orthopedic  service as well.  The patient can discuss this with her primary care  physician, Dr. Parke Simmers, when she sees her in follow-up.  It has been explained  to the patient that the DVT blood clots will dissipate over time, that the  filter that is in place is to prevent embolic events to the pulmonary  vasculature.   CONDITION ON DISCHARGE:  Improved.   PHYSICAL EXAMINATION AT TIME OF DISCHARGE:  The patient is afebrile.  Vital  signs are stable.  The right Wound-V.A.C. is in  place.  Dressing has been  changed.      Danae Chen, M.D.  Electronically Signed     RLK/MEDQ  D:  03/03/2005  T:  03/04/2005  Job:  [id number]   cc:   [name], M.D.  Fax: [phone]   Mila Homer. Sherlean Foot, M.D.  Fax: [phone]

## 2012-10-18 ENCOUNTER — Other Ambulatory Visit: Payer: Self-pay | Admitting: Internal Medicine

## 2012-10-18 DIAGNOSIS — R209 Unspecified disturbances of skin sensation: Secondary | ICD-10-CM

## 2012-10-24 ENCOUNTER — Ambulatory Visit
Admission: RE | Admit: 2012-10-24 | Discharge: 2012-10-24 | Disposition: A | Discharge status: Home / Self Care | Payer: Medicare Other | Source: Ambulatory Visit | Attending: Internal Medicine | Admitting: Internal Medicine

## 2012-10-24 DIAGNOSIS — R209 Unspecified disturbances of skin sensation: Secondary | ICD-10-CM

## 2012-10-24 IMAGING — CT CT HEAD W/O CM
2 series · 15 of 30 positions shown, 19 images · non-contrast
Comparison: [DATE].

CLINICAL DATA: Right hand numbness for 3 weeks.  Easily drops
objects.  Vertigo and headache.

CT HEAD WITHOUT CONTRAST
TECHNIQUE: Contiguous axial images were obtained from the base of
the skull through the vertex without contrast.

[Series 3: head bone · axial · 0.45mm/px · z∈[+18,+38]mm · 2 of 28 slices shown]
[im 2/28  bone]
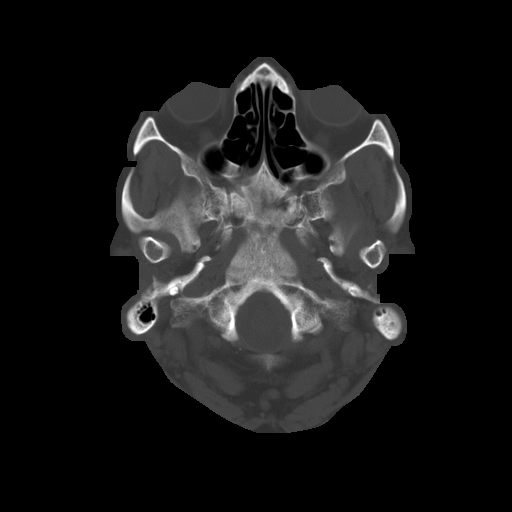
[im 6/28  bone]
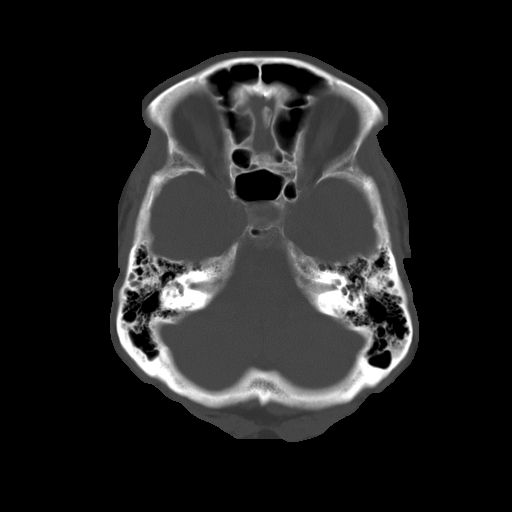

[Series 32: 3d filtered head w/o · axial · non-contrast · 0.45mm/px · z∈[+18,+141]mm · 13 of 28 slices shown, 17 images]
[im 2/28  brain]
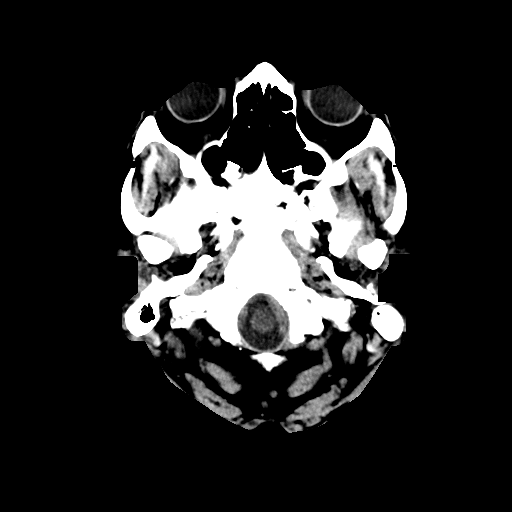
[im 2/28  bone]
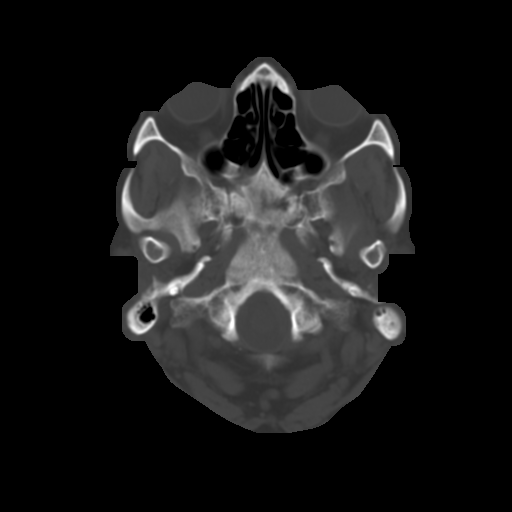
[im 4/28  brain]
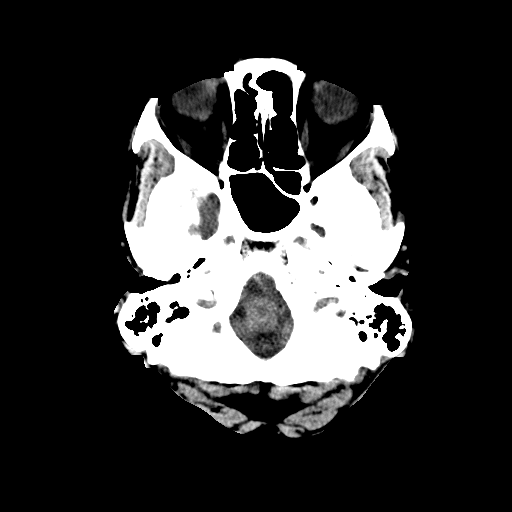
[im 6/28  brain]
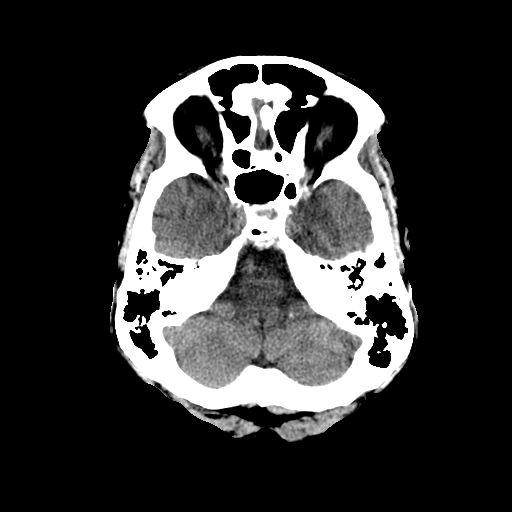
[im 8/28  brain]
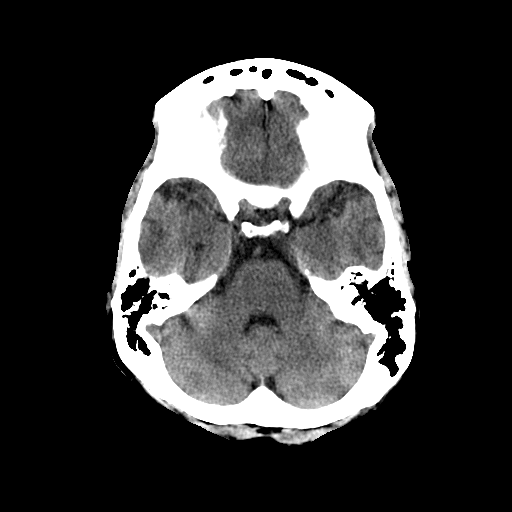
[im 10/28  brain]
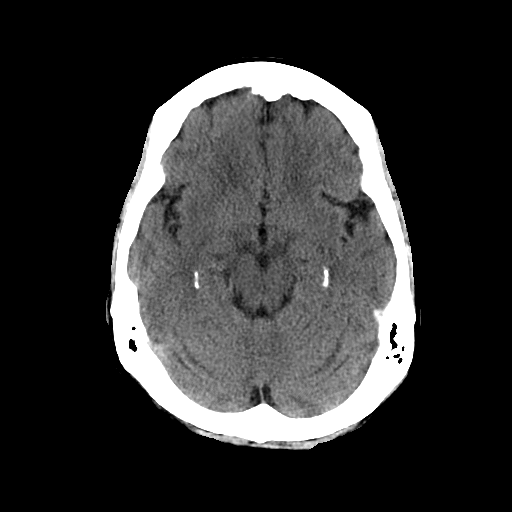
[im 10/28  bone]
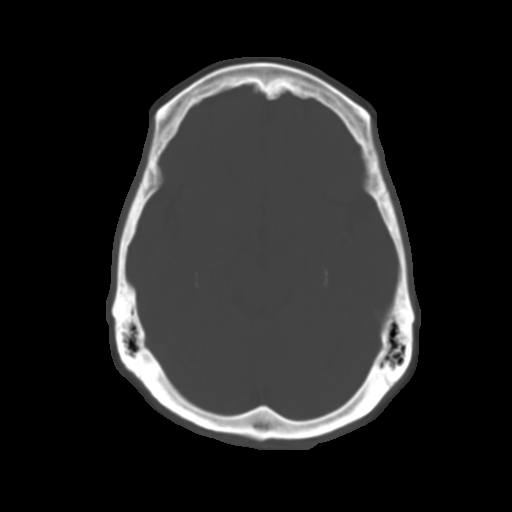
[im 12/28  brain]
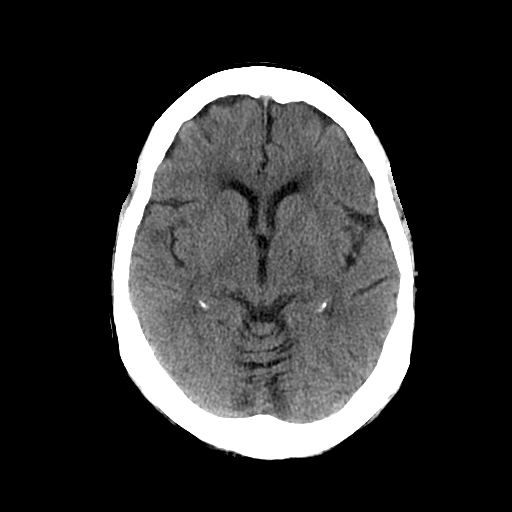
[im 14/28  brain]
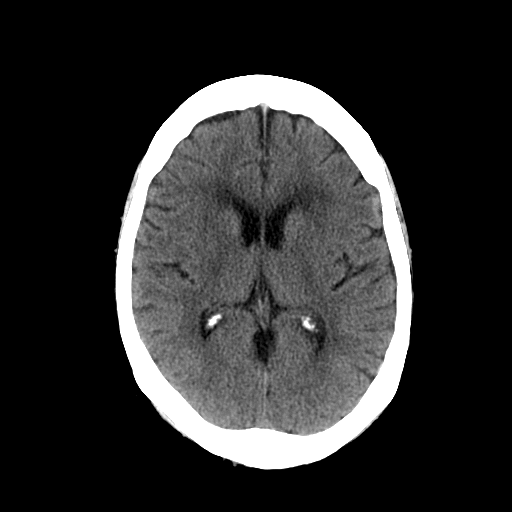
[im 16/28  brain]
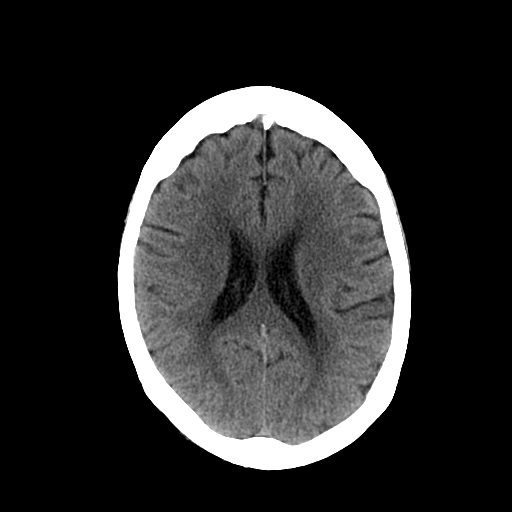
[im 18/28  brain]
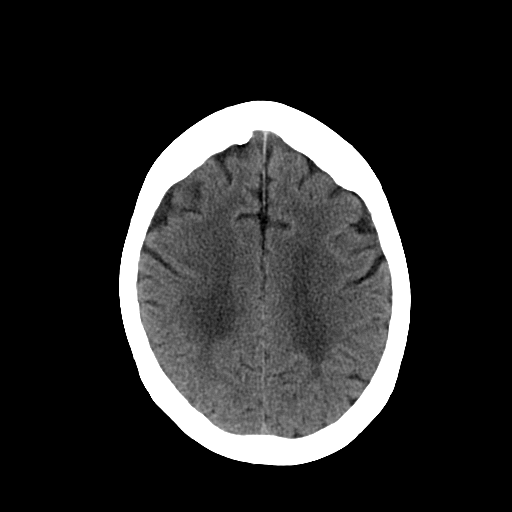
[im 18/28  bone]
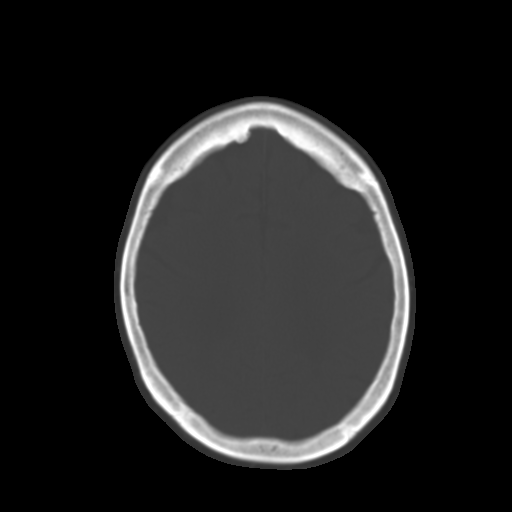
[im 20/28  brain]
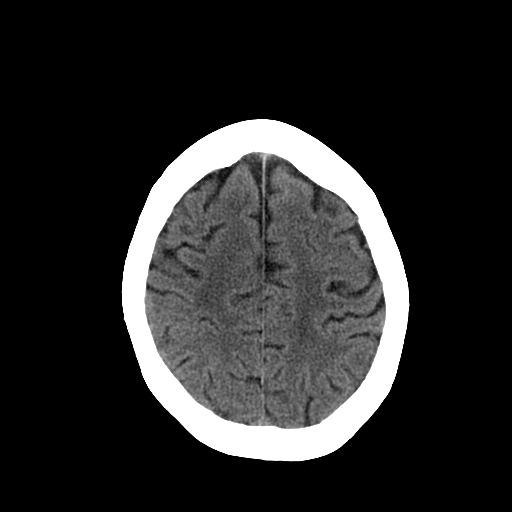
[im 22/28  brain]
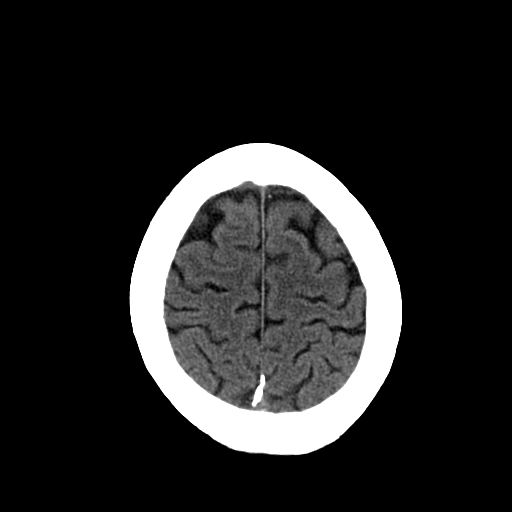
[im 24/28  brain]
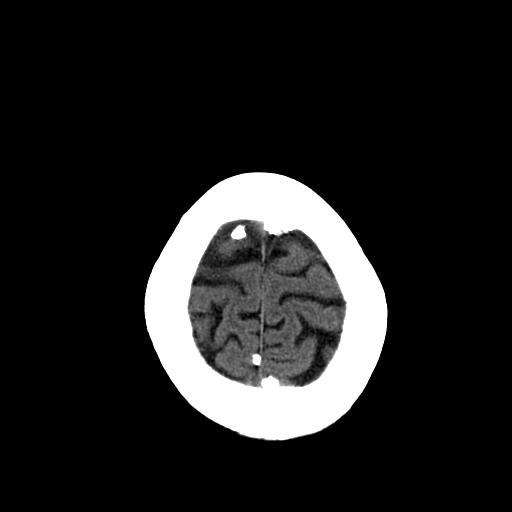
[im 26/28  brain]
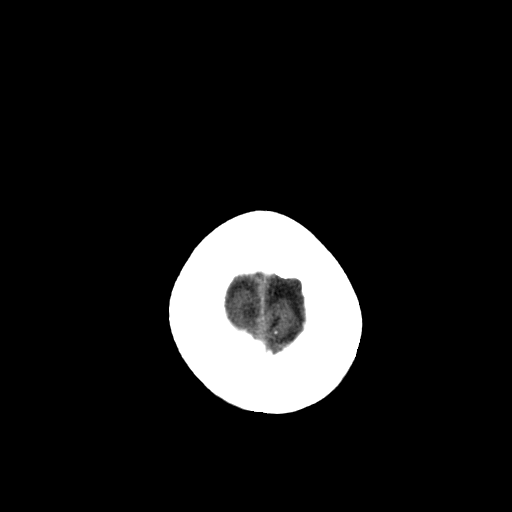
[im 26/28  bone]
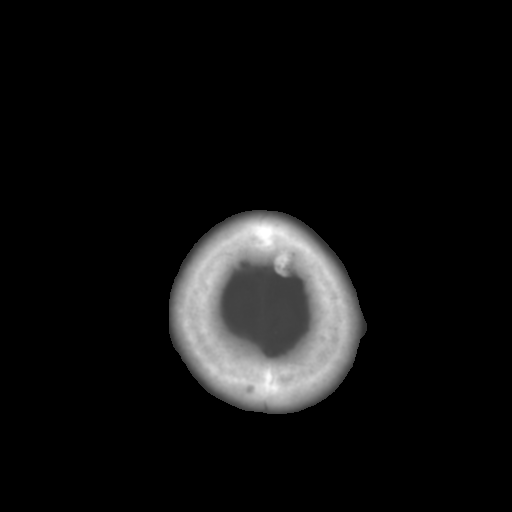

[15 of 30 positions shown; findings below may reference images not displayed]

FINDINGS: There is no evidence for acute infarction, intracranial
hemorrhage, mass lesion, hydrocephalus, or extra-axial fluid.  Mild
atrophy, not unexpected for the patient's age of 84.  Mild to
moderate chronic microvascular ischemic change is noted throughout
the periventricular and subcortical white matter, similar to
priors.  The calvarium is intact.  Clear sinuses and mastoids.
Compared with previous, the brain appears similar.  There is
improved sinus disease.
IMPRESSION: Stable exam.  Mild atrophy and mild to moderate chronic
microvascular ischemic change.  No acute intracranial findings are
evident.

If additional imaging is desired and no contraindications, consider
MRI brain without contrast.

## 2013-03-26 ENCOUNTER — Encounter (HOSPITAL_COMMUNITY): Payer: Self-pay | Admitting: Emergency Medicine

## 2013-03-26 ENCOUNTER — Emergency Department (HOSPITAL_COMMUNITY)
Admission: EM | Admit: 2013-03-26 | Discharge: 2013-03-26 | Disposition: A | Discharge status: Home / Self Care | Payer: Medicare Other | Attending: Emergency Medicine | Admitting: Emergency Medicine

## 2013-03-26 ENCOUNTER — Emergency Department (HOSPITAL_COMMUNITY): Payer: Medicare Other

## 2013-03-26 DIAGNOSIS — I10 Essential (primary) hypertension: Secondary | ICD-10-CM | POA: Diagnosis not present

## 2013-03-26 DIAGNOSIS — S0083XA Contusion of other part of head, initial encounter: Secondary | ICD-10-CM

## 2013-03-26 DIAGNOSIS — Y9389 Activity, other specified: Secondary | ICD-10-CM | POA: Diagnosis not present

## 2013-03-26 DIAGNOSIS — S0003XA Contusion of scalp, initial encounter: Secondary | ICD-10-CM | POA: Insufficient documentation

## 2013-03-26 DIAGNOSIS — W208XXA Other cause of strike by thrown, projected or falling object, initial encounter: Secondary | ICD-10-CM | POA: Insufficient documentation

## 2013-03-26 DIAGNOSIS — Y92009 Unspecified place in unspecified non-institutional (private) residence as the place of occurrence of the external cause: Secondary | ICD-10-CM | POA: Diagnosis not present

## 2013-03-26 DIAGNOSIS — S0990XA Unspecified injury of head, initial encounter: Secondary | ICD-10-CM | POA: Diagnosis present

## 2013-03-26 HISTORY — DX: Essential (primary) hypertension: I10
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
## 2013-03-26 MED ORDER — OXYCODONE-ACETAMINOPHEN 5-325 MG PO TABS: 2.0000 | ORAL_TABLET | Freq: Four times a day (QID) | ORAL | Status: DC | PRN

## 2013-03-26 MED ORDER — FENTANYL CITRATE 0.05 MG/ML IJ SOLN
100.0000 ug | Freq: Once | INTRAMUSCULAR | Status: AC
  Administered 2013-03-26: 100 ug via NASAL
  Filled 2013-03-26: qty 2

## 2013-03-26 NOTE — ED Notes (Signed)
Pt was reaching up to get a drawer. It hit head on rt upper eye area hematoma noted. Pt tearful states that it happened 30 mins ago and it is the worse headache she has ever had. Pt able to move all extermities at this time. Pt restless at this time . md bednar at bedside

## 2013-03-26 NOTE — ED Notes (Signed)
Patient transported to CT 

## 2013-03-26 NOTE — ED Notes (Signed)
MD at bedside. 

## 2013-03-26 NOTE — ED Provider Notes (Signed)
CSN: [id number]     Arrival date & time 03/26/13  1156 History   First MD Initiated Contact with Patient 03/26/13 1200     Chief Complaint  Patient presents with  . Headache   (Consider location/radiation/quality/duration/timing/severity/associated sxs/prior Treatment) HPI This 79 year old female does not take blood thinners was at home just PTA and was reaching towards a shelf on a dresser when the top half of a dresser fell over and hit her in the head, she complains of a severe headache, there is no syncope no amnesia for the event no neck pain no back pain no weakness no numbness no incoordination no chest pain no shortness of breath no abdominal pain, she has isolated head injury only with no laceration, there is no treatment prior to arrival, she is able to walk unassisted prior to arrival. Past Medical History  Diagnosis Date  . Hypertension    Past Surgical History  Procedure Laterality Date  . Total hip arthroplasty     No family history on file. History  Substance Use Topics  . Smoking status: Not on file  . Smokeless tobacco: Not on file  . Alcohol Use: Not on file   OB History   Grav Para Term Preterm Abortions TAB SAB Ect Mult Living                 Review of Systems 10 Systems reviewed and are negative for acute change except as noted in the HPI. Allergies  Review of patient's allergies indicates not on file.  Home Medications   Current Outpatient Rx  Name  Route  Sig  Dispense  Refill  . oxyCODONE-acetaminophen (PERCOCET) 5-325 MG per tablet   Oral   Take 2 tablets by mouth every 6 (six) hours as needed.   20 tablet   0    BP 167/72  Pulse 82  Temp(Src) 98.3 F (36.8 C)  SpO2 99% Physical Exam  Nursing note and vitals reviewed. Constitutional:  Awake, alert, nontoxic appearance with baseline speech for patient.  HENT:  Head: Atraumatic.  Mouth/Throat: No oropharyngeal exudate.  Eyes: EOM are normal. Pupils are equal, round, and reactive to  light. Right eye exhibits no discharge. Left eye exhibits no discharge.  Neck: Neck supple.  Cervical spine nontender  Cardiovascular: Normal rate and regular rhythm.   No murmur heard. Pulmonary/Chest: Effort normal and breath sounds normal. No stridor. No respiratory distress. She has no wheezes. She has no rales. She exhibits no tenderness.  Abdominal: Soft. Bowel sounds are normal. She exhibits no mass. There is no tenderness. There is no rebound.  Musculoskeletal: She exhibits no tenderness.  Baseline ROM, moves extremities with no obvious new focal weakness. Back nontender and all 4 extremities non-tender.  Lymphadenopathy:    She has no cervical adenopathy.  Neurological: She is alert.  Awake, alert, cooperative and aware of situation; motor strength 5/5 bilaterally; sensation normal to light touch bilaterally; peripheral visual fields full to confrontation; no facial asymmetry; tongue midline; major cranial nerves appear intact; no pronator drift, normal finger to nose bilaterally  Skin: No rash noted.  Psychiatric: She has a normal mood and affect.    ED Course  Procedures (including critical care time) Patient informed of clinical course, understand medical decision-making process, and agree with plan. Labs Review Labs Reviewed - No data to display Imaging Review No results found.  EKG Interpretation   None       MDM   1. Minor head injury without loss of consciousness,  initial encounter   2. Forehead contusion, initial encounter    I doubt any other EMC precluding discharge at this time including, but not necessarily limited to the following:ICH.    Hurman Horn, MD 03/28/13 [phone]

## 2013-04-02 ENCOUNTER — Ambulatory Visit (HOSPITAL_COMMUNITY)
Admission: RE | Admit: 2013-04-02 | Discharge: 2013-04-02 | Disposition: A | Discharge status: Home / Self Care | Payer: Medicare Other | Source: Ambulatory Visit | Attending: Internal Medicine | Admitting: Internal Medicine

## 2013-04-02 ENCOUNTER — Other Ambulatory Visit (HOSPITAL_COMMUNITY): Payer: Self-pay | Admitting: Internal Medicine

## 2013-04-02 DIAGNOSIS — S069X9A Unspecified intracranial injury with loss of consciousness of unspecified duration, initial encounter: Secondary | ICD-10-CM

## 2013-04-02 DIAGNOSIS — Z043 Encounter for examination and observation following other accident: Secondary | ICD-10-CM | POA: Insufficient documentation

## 2013-04-02 MED ORDER — IOHEXOL 350 MG/ML SOLN
100.0000 mL | Freq: Once | INTRAVENOUS | Status: AC | PRN
  Administered 2013-04-02: 100 mL via INTRAVENOUS

## 2013-05-23 ENCOUNTER — Encounter: Payer: Self-pay | Admitting: Neurology

## 2013-05-24 ENCOUNTER — Telehealth: Payer: Self-pay | Admitting: Neurology

## 2013-05-24 ENCOUNTER — Encounter: Payer: Self-pay | Admitting: Neurology

## 2013-05-24 ENCOUNTER — Other Ambulatory Visit (INDEPENDENT_AMBULATORY_CARE_PROVIDER_SITE_OTHER): Payer: Self-pay

## 2013-05-24 ENCOUNTER — Encounter (INDEPENDENT_AMBULATORY_CARE_PROVIDER_SITE_OTHER): Payer: Self-pay

## 2013-05-24 ENCOUNTER — Ambulatory Visit (INDEPENDENT_AMBULATORY_CARE_PROVIDER_SITE_OTHER): Payer: Medicare Other | Admitting: Neurology

## 2013-05-24 VITALS — BP 162/85 | HR 87 | Ht 69.5 in | Wt 232.0 lb

## 2013-05-24 DIAGNOSIS — R55 Syncope and collapse: Secondary | ICD-10-CM

## 2013-05-24 DIAGNOSIS — Z0289 Encounter for other administrative examinations: Secondary | ICD-10-CM

## 2013-05-24 HISTORY — DX: Syncope and collapse: R55

## 2013-05-24 NOTE — Telephone Encounter (Signed)
I called the patient. The EEG study was unremarkable. MRI of the brain and carotid Doppler study are pending.
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
## 2013-05-24 NOTE — Patient Instructions (Signed)
Syncope °Syncope means a person passes out (faints). The person usually wakes up in less than 5 minutes. It is important to seek medical care for syncope. °HOME CARE °· Have someone stay with you until you feel normal. °· Do not drive, use machines, or play sports until your doctor says it is okay. °· Keep all doctor visits as told. °· Lie down when you feel like you might pass out. Take deep breaths. Wait until you feel normal before standing up. °· Drink enough fluids to keep your pee (urine) clear or pale yellow. °· If you take blood pressure or heart medicine, get up slowly. Take several minutes to sit and then stand. °GET HELP RIGHT AWAY IF:  °· You have a severe headache. °· You have pain in the chest, belly (abdomen), or back. °· You are bleeding from the mouth or butt (rectum). °· You have black or tarry poop (stool). °· You have an irregular or very fast heartbeat. °· You have pain with breathing. °· You keep passing out, or you have shaking (seizures) when you pass out. °· You pass out when sitting or lying down. °· You feel confused. °· You have trouble walking. °· You have severe weakness. °· You have vision problems. °If you fainted, call your local emergency services (911 in [place] Do not drive yourself to the hospital. °MAKE SURE YOU:  °· Understand these instructions. °· Will watch your condition. °· Will get help right away if you are not doing well or get worse. °Document Released: 09/21/2007 Document Revised: 10/04/2011 Document Reviewed: 06/03/2011 °[employer]® Patient Information ©2014 [employer]. ° °

## 2013-05-24 NOTE — Procedures (Signed)
    History:  [name] is an 80 year old patient with a history of a syncopal event that occurred on 04/18/2013. The patient was unconscious for several moments, unassociated with tongue biting or bowel or bladder incontinence. The patient being evaluated for the syncope.   This is a routine EEG. No skull defects are noted. Medications include Vitamin D, gabapentin, lisinopril, hydrochlorothiazide, oxycodone, and Zanaflex.   EEG classification: Normal awake  Description of the recording: The background rhythms of this recording consists of a fairly well modulated medium amplitude alpha rhythm of 10 Hz that is reactive to eye opening and closure. As the record progresses, the patient appears to remain in the waking state throughout the recording. Photic stimulation was performed, resulting in a bilateral and symmetric photic driving response. Hyperventilation was also performed, resulting in a minimal buildup of the background rhythm activities without significant slowing seen. At no time during the recording does there appear to be evidence of spike or spike wave discharges or evidence of focal slowing. EKG monitor shows no evidence of cardiac rhythm abnormalities with a heart rate of 66.  Impression: This is a normal EEG recording in the waking state. No evidence of ictal or interictal discharges are seen.

## 2013-06-03 ENCOUNTER — Telehealth: Payer: Self-pay | Admitting: Neurology

## 2013-06-03 ENCOUNTER — Ambulatory Visit
Admission: RE | Admit: 2013-06-03 | Discharge: 2013-06-03 | Disposition: A | Discharge status: Home / Self Care | Payer: Medicare Other | Source: Ambulatory Visit | Attending: Neurology | Admitting: Neurology

## 2013-06-03 DIAGNOSIS — R55 Syncope and collapse: Secondary | ICD-10-CM

## 2013-06-03 NOTE — Telephone Encounter (Signed)
I called the patient. The MRI the brain shows a moderate level small vessel disease, nothing acute. I have instructed the patient go on low-dose aspirin. The patient has had an EEG study was normal. The carotid Doppler is pending.  MRI brain 06/03/2013:  Impression   Abnormal MRI scan of cervical spine showing mild spondylitic  changes at C4-5 and C5-6 resulting in mild left to right foraminal  narrowing but without definite compression. No enhancing lesions are  noted.

## 2013-06-12 ENCOUNTER — Ambulatory Visit (INDEPENDENT_AMBULATORY_CARE_PROVIDER_SITE_OTHER): Payer: Medicare Other

## 2013-06-12 ENCOUNTER — Encounter (INDEPENDENT_AMBULATORY_CARE_PROVIDER_SITE_OTHER): Payer: Self-pay

## 2013-06-12 DIAGNOSIS — R55 Syncope and collapse: Secondary | ICD-10-CM

## 2013-06-17 ENCOUNTER — Telehealth: Payer: Self-pay | Admitting: Neurology

## 2013-06-17 NOTE — Telephone Encounter (Signed)
I called the patient. The carotid Doppler study was unremarkable.

## 2014-03-05 ENCOUNTER — Encounter: Payer: Self-pay | Admitting: Neurology

## 2014-03-11 ENCOUNTER — Encounter: Payer: Self-pay | Admitting: Neurology

## 2014-04-21 DIAGNOSIS — M1711 Unilateral primary osteoarthritis, right knee: Secondary | ICD-10-CM | POA: Diagnosis not present

## 2014-04-21 DIAGNOSIS — M1712 Unilateral primary osteoarthritis, left knee: Secondary | ICD-10-CM | POA: Diagnosis not present

## 2014-06-05 DIAGNOSIS — R202 Paresthesia of skin: Secondary | ICD-10-CM | POA: Diagnosis not present

## 2014-06-05 DIAGNOSIS — J449 Chronic obstructive pulmonary disease, unspecified: Secondary | ICD-10-CM | POA: Diagnosis not present

## 2014-06-05 DIAGNOSIS — I10 Essential (primary) hypertension: Secondary | ICD-10-CM | POA: Diagnosis not present

## 2014-06-05 DIAGNOSIS — R42 Dizziness and giddiness: Secondary | ICD-10-CM | POA: Diagnosis not present

## 2014-06-05 DIAGNOSIS — F430 Acute stress reaction: Secondary | ICD-10-CM | POA: Diagnosis not present

## 2014-06-26 DIAGNOSIS — J449 Chronic obstructive pulmonary disease, unspecified: Secondary | ICD-10-CM | POA: Diagnosis not present

## 2014-06-26 DIAGNOSIS — I10 Essential (primary) hypertension: Secondary | ICD-10-CM | POA: Diagnosis not present

## 2014-06-26 DIAGNOSIS — M129 Arthropathy, unspecified: Secondary | ICD-10-CM | POA: Diagnosis not present

## 2014-06-26 DIAGNOSIS — F430 Acute stress reaction: Secondary | ICD-10-CM | POA: Diagnosis not present

## 2014-07-09 DIAGNOSIS — F17209 Nicotine dependence, unspecified, with unspecified nicotine-induced disorders: Secondary | ICD-10-CM | POA: Diagnosis not present

## 2014-07-09 DIAGNOSIS — Z86718 Personal history of other venous thrombosis and embolism: Secondary | ICD-10-CM | POA: Diagnosis not present

## 2014-07-09 DIAGNOSIS — I10 Essential (primary) hypertension: Secondary | ICD-10-CM | POA: Diagnosis not present

## 2014-07-09 DIAGNOSIS — R9431 Abnormal electrocardiogram [ECG] [EKG]: Secondary | ICD-10-CM | POA: Diagnosis not present

## 2014-07-18 DIAGNOSIS — R9431 Abnormal electrocardiogram [ECG] [EKG]: Secondary | ICD-10-CM | POA: Diagnosis not present

## 2014-07-18 DIAGNOSIS — R0602 Shortness of breath: Secondary | ICD-10-CM | POA: Diagnosis not present

## 2014-07-18 DIAGNOSIS — I10 Essential (primary) hypertension: Secondary | ICD-10-CM | POA: Diagnosis not present

## 2014-07-28 DIAGNOSIS — M1711 Unilateral primary osteoarthritis, right knee: Secondary | ICD-10-CM | POA: Diagnosis not present

## 2014-07-30 DIAGNOSIS — I10 Essential (primary) hypertension: Secondary | ICD-10-CM | POA: Diagnosis not present

## 2014-07-30 DIAGNOSIS — R9431 Abnormal electrocardiogram [ECG] [EKG]: Secondary | ICD-10-CM | POA: Diagnosis not present

## 2014-07-30 DIAGNOSIS — R0602 Shortness of breath: Secondary | ICD-10-CM | POA: Diagnosis not present

## 2014-08-08 DIAGNOSIS — I10 Essential (primary) hypertension: Secondary | ICD-10-CM | POA: Diagnosis not present

## 2014-08-08 DIAGNOSIS — R0602 Shortness of breath: Secondary | ICD-10-CM | POA: Diagnosis not present

## 2014-08-08 DIAGNOSIS — F17209 Nicotine dependence, unspecified, with unspecified nicotine-induced disorders: Secondary | ICD-10-CM | POA: Diagnosis not present

## 2014-08-08 DIAGNOSIS — R9431 Abnormal electrocardiogram [ECG] [EKG]: Secondary | ICD-10-CM | POA: Diagnosis not present

## 2014-08-21 DIAGNOSIS — R42 Dizziness and giddiness: Secondary | ICD-10-CM | POA: Diagnosis not present

## 2014-08-21 DIAGNOSIS — M5136 Other intervertebral disc degeneration, lumbar region: Secondary | ICD-10-CM | POA: Diagnosis not present

## 2014-08-21 DIAGNOSIS — Z1389 Encounter for screening for other disorder: Secondary | ICD-10-CM | POA: Diagnosis not present

## 2014-08-21 DIAGNOSIS — J449 Chronic obstructive pulmonary disease, unspecified: Secondary | ICD-10-CM | POA: Diagnosis not present

## 2014-08-21 DIAGNOSIS — I10 Essential (primary) hypertension: Secondary | ICD-10-CM | POA: Diagnosis not present

## 2014-08-21 DIAGNOSIS — F430 Acute stress reaction: Secondary | ICD-10-CM | POA: Diagnosis not present

## 2014-10-27 DIAGNOSIS — M1712 Unilateral primary osteoarthritis, left knee: Secondary | ICD-10-CM | POA: Diagnosis not present

## 2014-10-27 DIAGNOSIS — M1711 Unilateral primary osteoarthritis, right knee: Secondary | ICD-10-CM | POA: Diagnosis not present

## 2014-12-19 ENCOUNTER — Emergency Department (HOSPITAL_COMMUNITY)
Admission: EM | Admit: 2014-12-19 | Discharge: 2014-12-20 | Disposition: A | Discharge status: Home / Self Care | Payer: Medicare Other | Attending: Emergency Medicine | Admitting: Emergency Medicine

## 2014-12-19 ENCOUNTER — Encounter (HOSPITAL_COMMUNITY): Payer: Self-pay | Admitting: Emergency Medicine

## 2014-12-19 DIAGNOSIS — I10 Essential (primary) hypertension: Secondary | ICD-10-CM | POA: Insufficient documentation

## 2014-12-19 DIAGNOSIS — L02212 Cutaneous abscess of back [any part, except buttock]: Secondary | ICD-10-CM | POA: Insufficient documentation

## 2014-12-19 DIAGNOSIS — G5600 Carpal tunnel syndrome, unspecified upper limb: Secondary | ICD-10-CM | POA: Insufficient documentation

## 2014-12-19 DIAGNOSIS — Z880 Allergy status to penicillin: Secondary | ICD-10-CM | POA: Insufficient documentation

## 2014-12-19 DIAGNOSIS — Z7982 Long term (current) use of aspirin: Secondary | ICD-10-CM | POA: Insufficient documentation

## 2014-12-19 DIAGNOSIS — L0291 Cutaneous abscess, unspecified: Secondary | ICD-10-CM

## 2014-12-19 DIAGNOSIS — Z720 Tobacco use: Secondary | ICD-10-CM | POA: Insufficient documentation

## 2014-12-19 DIAGNOSIS — E559 Vitamin D deficiency, unspecified: Secondary | ICD-10-CM | POA: Diagnosis not present

## 2014-12-19 DIAGNOSIS — R238 Other skin changes: Secondary | ICD-10-CM | POA: Diagnosis present

## 2014-12-19 DIAGNOSIS — Z86718 Personal history of other venous thrombosis and embolism: Secondary | ICD-10-CM | POA: Diagnosis not present

## 2014-12-19 DIAGNOSIS — A1801 Tuberculosis of spine: Secondary | ICD-10-CM | POA: Diagnosis not present

## 2014-12-19 DIAGNOSIS — Z79899 Other long term (current) drug therapy: Secondary | ICD-10-CM | POA: Insufficient documentation

## 2014-12-19 DIAGNOSIS — Z8742 Personal history of other diseases of the female genital tract: Secondary | ICD-10-CM | POA: Insufficient documentation

## 2014-12-19 DIAGNOSIS — M1711 Unilateral primary osteoarthritis, right knee: Secondary | ICD-10-CM | POA: Insufficient documentation

## 2014-12-19 MED ORDER — LIDOCAINE HCL (PF) 1 % IJ SOLN
2.0000 mL | Freq: Once | INTRAMUSCULAR | Status: DC
  Filled 2014-12-19: qty 5

## 2014-12-19 NOTE — ED Notes (Signed)
Pt states something bit her back but she does not know what it was  Pt states it has been itching  Pt has a raised red area noted to the center of her back  Pt states it is very sore

## 2014-12-20 NOTE — Discharge Instructions (Signed)
Abscess Care After An abscess (also called a boil or furuncle) is an infected area that contains a collection of pus. Signs and symptoms of an abscess include pain, tenderness, redness, or hardness, or you may feel a moveable soft area under your skin. An abscess can occur anywhere in the body. The infection may spread to surrounding tissues causing cellulitis. A cut (incision) by the surgeon was made over your abscess and the pus was drained out. Gauze may have been packed into the space to provide a drain that will allow the cavity to heal from the inside outwards. The boil may be painful for 5 to 7 days. Most people with a boil do not have high fevers. Your abscess, if seen early, may not have localized, and may not have been lanced. If not, another appointment may be required for this if it does not get better on its own or with medications. HOME CARE INSTRUCTIONS   Only take over-the-counter or prescription medicines for pain, discomfort, or fever as directed by your caregiver.  When you bathe, soak and then remove gauze or iodoform packs at least daily or as directed by your caregiver. You may then wash the wound gently with mild soapy water. Repack with gauze or do as your caregiver directs. SEEK IMMEDIATE MEDICAL CARE IF:   You develop increased pain, swelling, redness, drainage, or bleeding in the wound site.  You develop signs of generalized infection including muscle aches, chills, fever, or a general ill feeling.  An oral temperature above 102 F (38.9 C) develops, not controlled by medication. See your caregiver for a recheck if you develop any of the symptoms described above. If medications (antibiotics) were prescribed, take them as directed. Document Released: 10/21/2004 Document Revised: 06/27/2011 Document Reviewed: 06/18/2007 [facility] Patient Information 2015 [place], [place]. This information is not intended to replace advice given to you by your health care provider. Make sure  you discuss any questions you have with your health care provider. Had a very small infected blackhead that was opened and drained you may have a little blood on the Band-Aid.  This can be changed tomorrow.  Should not need any further care

## 2014-12-20 NOTE — ED Provider Notes (Addendum)
CSN: [id number]     Arrival date & time 12/19/14  2330 History   First MD Initiated Contact with Patient 12/19/14 2336     Chief Complaint  Patient presents with  . Insect Bite     (Consider location/radiation/quality/duration/timing/severity/associated sxs/prior Treatment) HPI Comments: Pimple in themiddlw of her back that she can ot reachhasgotten bidder over the past several days   The history is provided by the patient.    Past Medical History  Diagnosis Date  . Hypertension   . Deep venous thrombosis     bilateral legs  . Arthritis   . Lump or mass in breast   . Paresthesia   . Lumbar degenerative disc disease   . Carpal tunnel syndrome   . Allergic rhinitis   . Syncope 05/24/2013  . Vitamin D deficiency   . Degenerative arthritis     right knee   Past Surgical History  Procedure Laterality Date  . Total hip arthroplasty Right   . Abdominal hysterectomy    . Hemorroidectomy    . Ivc filter    . Cataract extraction Bilateral   . Carpal tunnel release Right   . Ulnar nerve transposition Right    Family History  Problem Relation Age of Onset  . Diabetes Mother   . Kidney failure Father   . Cancer - Lung Brother   . Cancer - Prostate Brother   . Kidney failure Son    Social History  Substance Use Topics  . Smoking status: Current Every Day Smoker -- 10.00 packs/day for 30 years  . Smokeless tobacco: Never Used  . Alcohol Use: Yes     Comment: occasional   OB History    No data available     Review of Systems  Respiratory: Negative for cough and shortness of breath.   Skin: Positive for wound.  All other systems reviewed and are negative.     Allergies  Codeine and Penicillins  Home Medications   Prior to Admission medications   Medication Sig Start Date End Date Taking? Authorizing Provider  albuterol (PROVENTIL HFA;VENTOLIN HFA) 108 (90 BASE) MCG/ACT inhaler Inhale 2 puffs into the lungs every 6 (six) hours as needed for wheezing or shortness  of breath.   Yes Historical Provider, MD  amLODipine (NORVASC) 5 MG tablet Take 5 mg by mouth daily. 12/02/14  Yes Historical Provider, MD  aspirin 81 MG tablet Take 81 mg by mouth daily.   Yes Historical Provider, MD  ergocalciferol (VITAMIN D2) 50000 UNITS capsule Take 50,000 Units by mouth every Monday.    Yes Historical Provider, MD  gabapentin (NEURONTIN) 300 MG capsule Take 300 mg by mouth 2 (two) times daily.    Yes Historical Provider, MD  lisinopril-hydrochlorothiazide (PRINZIDE,ZESTORETIC) 20-12.5 MG per tablet Take 1 tablet by mouth daily.   Yes Historical Provider, MD  meclizine (ANTIVERT) 25 MG tablet Take 25 mg by mouth every 8 (eight) hours as needed for dizziness.  10/27/14  Yes Historical Provider, MD  Multiple Vitamin (MULTIVITAMIN WITH MINERALS) TABS tablet Take 1 tablet by mouth daily.   Yes Historical Provider, MD  traMADol (ULTRAM) 50 MG tablet TAKE 1 TABLET BY MOUTH 2-3 TIMES A DAY AS NEEDED FOR PAIN 10/27/14  Yes Historical Provider, MD  HYDROcodone-acetaminophen (NORCO/VICODIN) 5-325 MG per tablet Take 1 tablet by mouth every 4 (four) hours as needed. 01/13/15   Chesley Noon [place], PA-C   BP 157/76 mmHg  Pulse 90  Temp(Src) 98.6 F (37 C) (Oral)  Resp 18  SpO2 95% Physical Exam  Constitutional: She appears well-developed and well-nourished.  HENT:  Head: Normocephalic.  Eyes: Pupils are equal, round, and reactive to light.  Neck: Normal range of motion.  Cardiovascular: Normal rate.   Pulmonary/Chest: Effort normal.  Musculoskeletal: Normal range of motion.  Neurological: She is alert.  Skin: No erythema.  Small inflamed "black head" mid back  Nursing note and vitals reviewed.   ED Course  INCISION AND DRAINAGE Date/Time: 12/20/2014 12:10 AM Performed by: Junius Creamer Authorized by: Junius Creamer Consent: Verbal consent obtained. Written consent not obtained. Risks and benefits: risks, benefits and alternatives were discussed Consent given by:  patient Patient understanding: patient states understanding of the procedure being performed Patient identity confirmed: verbally with patient Time out: Immediately prior to procedure a "time out" was called to verify the correct patient, procedure, equipment, support staff and site/side marked as required. Type: abscess Body area: trunk Anesthesia: local infiltration Local anesthetic: lidocaine 1% without epinephrine Anesthetic total: 0.5 ml Patient sedated: no Scalpel size: 11 Needle gauge: 22 Incision type: elliptical Incision depth: subcutaneous Complexity: simple Drainage: purulent Drainage amount: scant Wound treatment: wound left open Patient tolerance: Patient tolerated the procedure well with no immediate complications   (including critical care time) Labs Review Labs Reviewed - No data to display  Imaging Review No results found. I have personally reviewed and evaluated these images and lab results as part of my medical decision-making.   EKG Interpretation None      MDM   Final diagnoses:  Abscess     I personally performed the services described in this documentation, which was scribed in my presence. The recorded information has been reviewed and is accurate.    Junius Creamer, NP 12/20/14 [id number]  Dorie Rank, MD 12/25/14 [place], NP 01/30/15 2774  Dorie Rank, MD 02/02/15 [phone]

## 2015-01-13 ENCOUNTER — Emergency Department (HOSPITAL_COMMUNITY)
Admission: EM | Admit: 2015-01-13 | Discharge: 2015-01-13 | Disposition: A | Discharge status: Home / Self Care | Payer: Medicare Other | Attending: Emergency Medicine | Admitting: Emergency Medicine

## 2015-01-13 ENCOUNTER — Encounter (HOSPITAL_COMMUNITY): Payer: Self-pay

## 2015-01-13 ENCOUNTER — Emergency Department (HOSPITAL_COMMUNITY): Payer: Medicare Other

## 2015-01-13 DIAGNOSIS — M25511 Pain in right shoulder: Secondary | ICD-10-CM | POA: Diagnosis not present

## 2015-01-13 DIAGNOSIS — Z79899 Other long term (current) drug therapy: Secondary | ICD-10-CM | POA: Insufficient documentation

## 2015-01-13 DIAGNOSIS — M1990 Unspecified osteoarthritis, unspecified site: Secondary | ICD-10-CM | POA: Diagnosis not present

## 2015-01-13 DIAGNOSIS — Z8742 Personal history of other diseases of the female genital tract: Secondary | ICD-10-CM | POA: Diagnosis not present

## 2015-01-13 DIAGNOSIS — Z8709 Personal history of other diseases of the respiratory system: Secondary | ICD-10-CM | POA: Insufficient documentation

## 2015-01-13 DIAGNOSIS — Z86718 Personal history of other venous thrombosis and embolism: Secondary | ICD-10-CM | POA: Insufficient documentation

## 2015-01-13 DIAGNOSIS — I10 Essential (primary) hypertension: Secondary | ICD-10-CM | POA: Insufficient documentation

## 2015-01-13 DIAGNOSIS — Z7982 Long term (current) use of aspirin: Secondary | ICD-10-CM | POA: Insufficient documentation

## 2015-01-13 DIAGNOSIS — G5600 Carpal tunnel syndrome, unspecified upper limb: Secondary | ICD-10-CM | POA: Diagnosis not present

## 2015-01-13 DIAGNOSIS — Z880 Allergy status to penicillin: Secondary | ICD-10-CM | POA: Insufficient documentation

## 2015-01-13 DIAGNOSIS — M791 Myalgia: Secondary | ICD-10-CM | POA: Diagnosis not present

## 2015-01-13 DIAGNOSIS — E559 Vitamin D deficiency, unspecified: Secondary | ICD-10-CM | POA: Insufficient documentation

## 2015-01-13 DIAGNOSIS — Z720 Tobacco use: Secondary | ICD-10-CM | POA: Diagnosis not present

## 2015-01-13 DIAGNOSIS — M7918 Myalgia, other site: Secondary | ICD-10-CM

## 2015-01-13 IMAGING — CR DG SHOULDER 2+V*R*
3 series · 3 of 3 positions shown · non-contrast
Comparison: None.

CLINICAL DATA: Posterior right shoulder pain with limited range of
motion. No reported injury.

EXAM:
RIGHT SHOULDER - 2+ VIEW

[x shoulder ap right (1 of 3)]
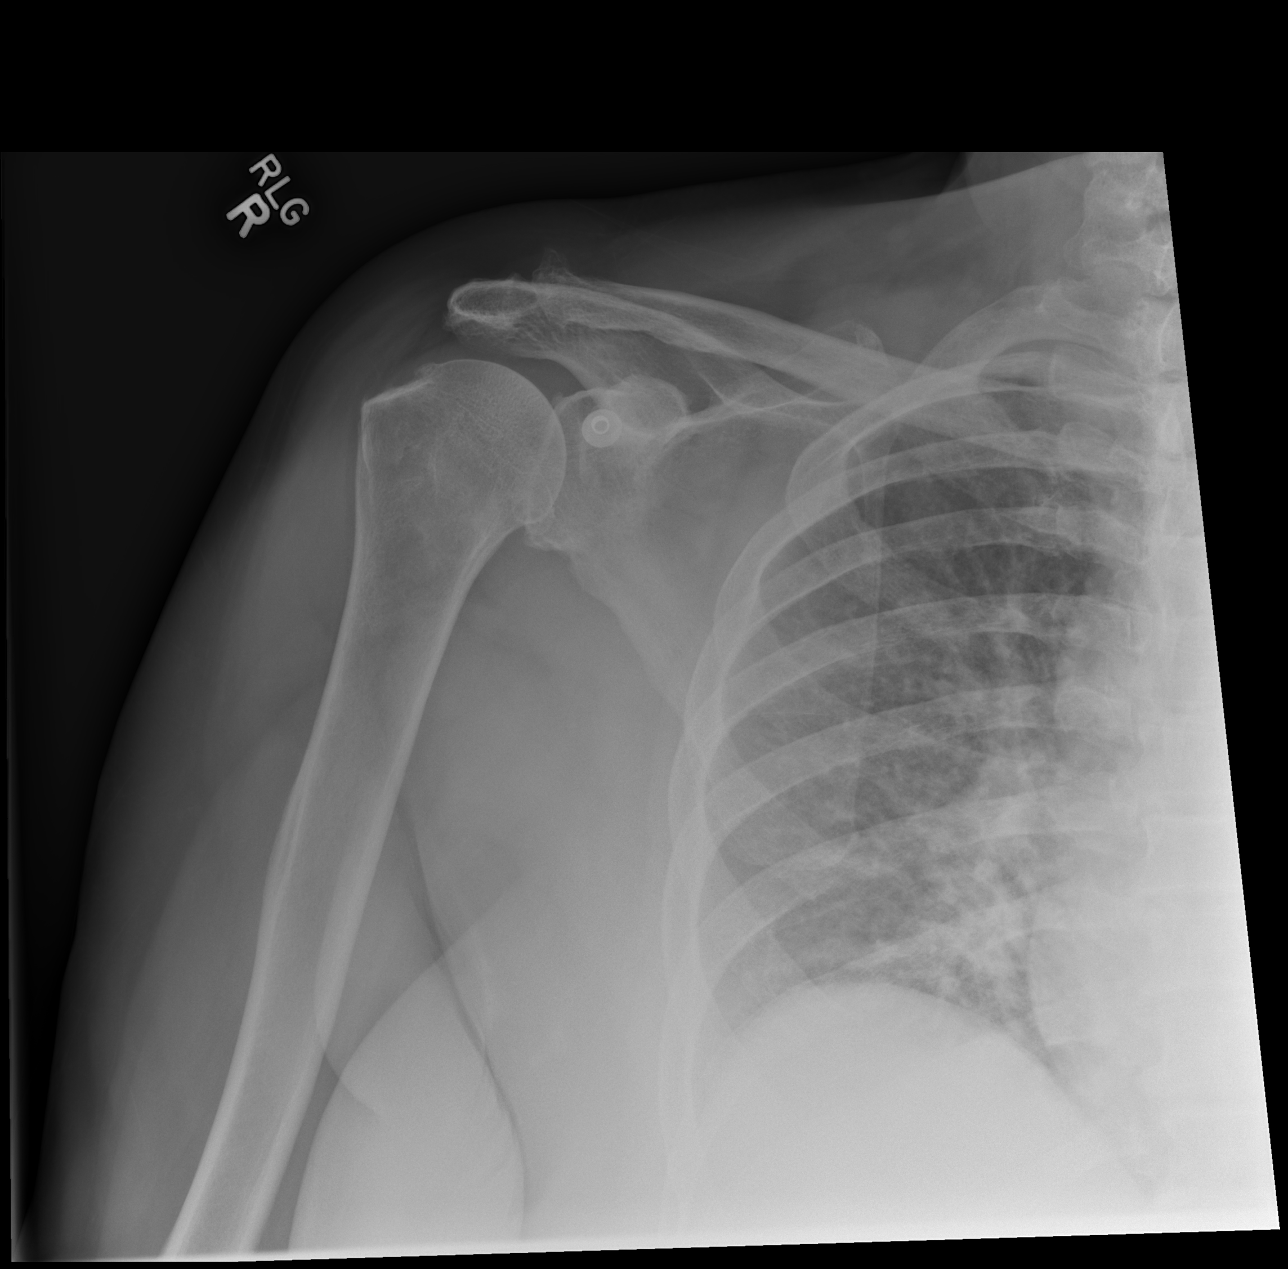

[x shoulder ap right (2 of 3)]
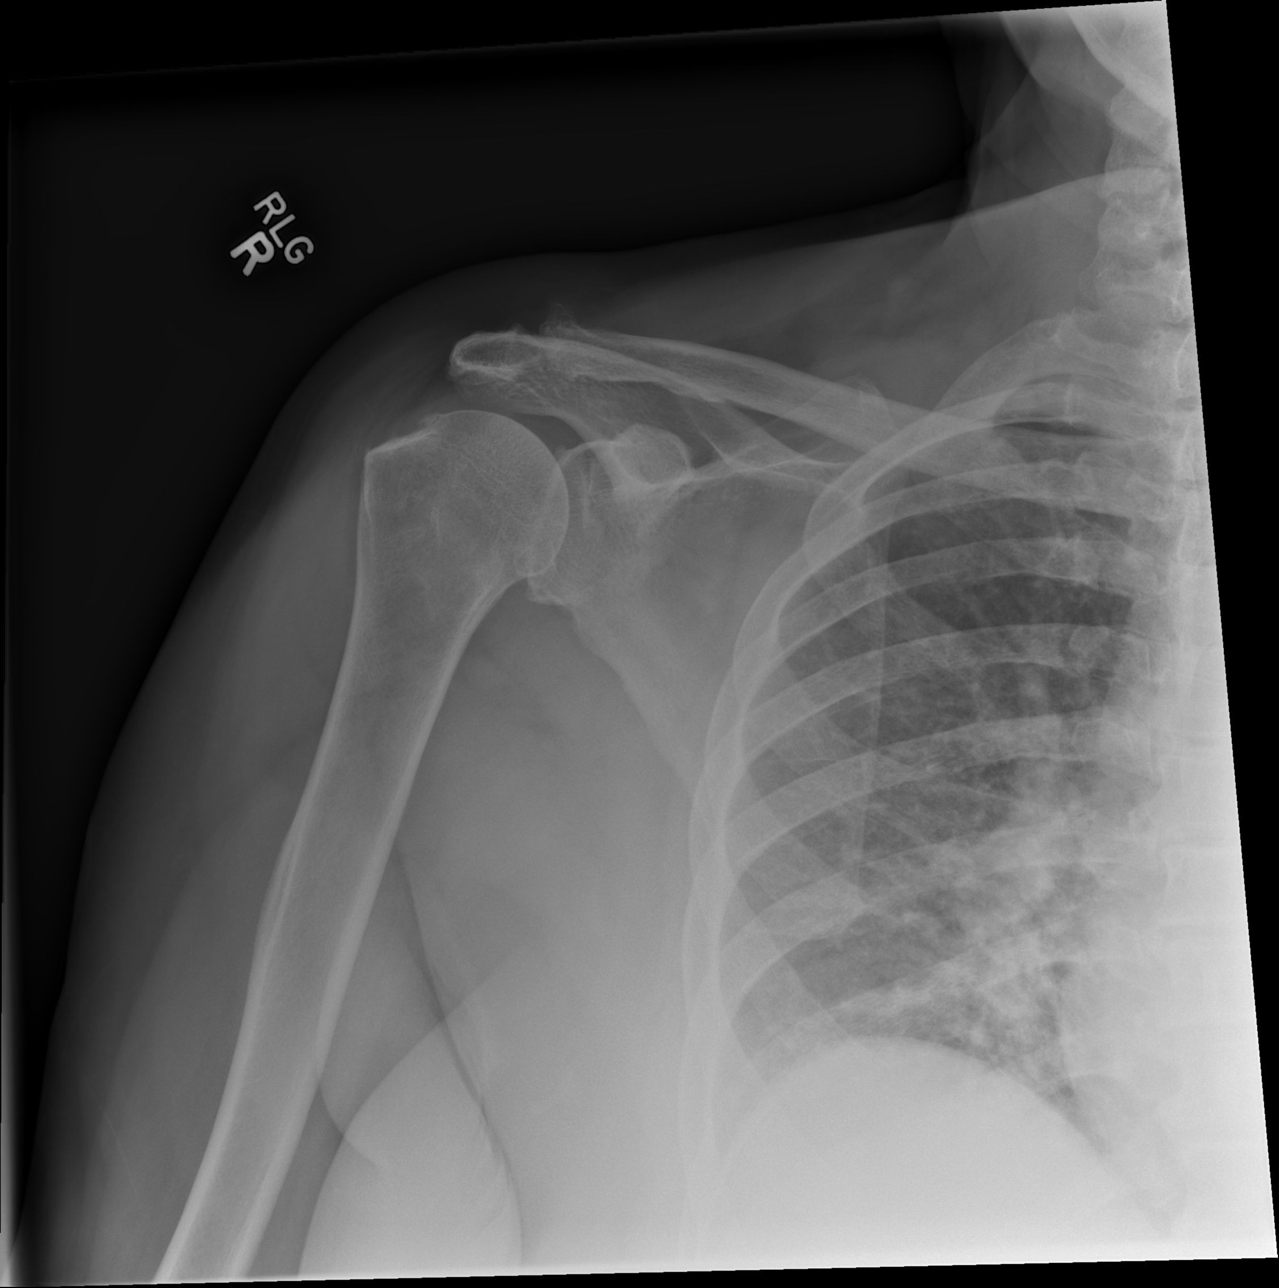

[x shoulder ap right (3 of 3)]
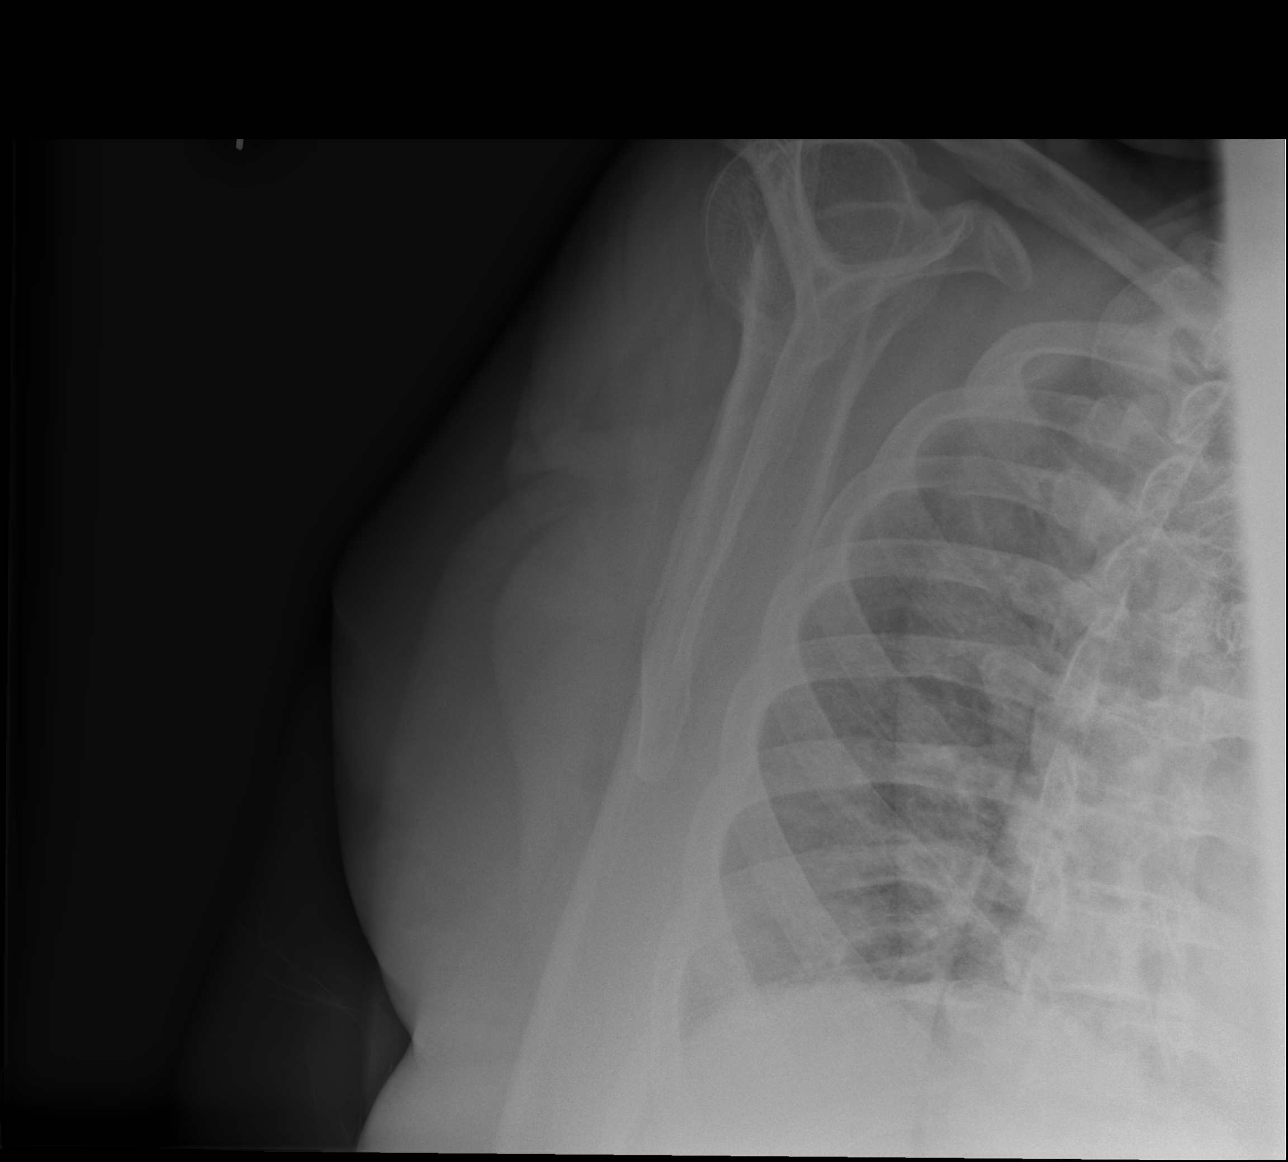

[3 of 3 positions shown; findings below may reference images not displayed]

FINDINGS: The AP internal and external rotation views are limited by patient's
limited mobility. No fracture, dislocation, Hill-Sachs deformity or
suspicious focal osseous lesion. Moderate osteoarthritis in the
right acromioclavicular joint. There are small marginal osteophytes
in the right humeral head and right glenoid, in keeping with mild
right glenohumeral joint osteoarthritis. Tiny enthesophytes are
present at the right greater tuberosity. Otherwise no pathologic
soft tissue calcifications.
IMPRESSION: 1. No fracture or dislocation in the right shoulder.
2. Moderate right acromioclavicular joint and mild right
glenohumeral joint osteoarthritis.

## 2015-01-13 MED ORDER — HYDROCODONE-ACETAMINOPHEN 5-325 MG PO TABS: 1.0000 | ORAL_TABLET | ORAL | Status: DC | PRN

## 2015-01-13 MED ORDER — FENTANYL CITRATE (PF) 100 MCG/2ML IJ SOLN
25.0000 ug | Freq: Once | INTRAMUSCULAR | Status: AC
  Administered 2015-01-13: 25 ug via INTRAVENOUS
  Filled 2015-01-13: qty 2

## 2015-01-13 NOTE — ED Notes (Signed)
Pt transported to xray 

## 2015-01-13 NOTE — Discharge Instructions (Signed)
Please take your pain medications as prescribed for pain relief. Please follow up with your primary care provider in the next week. Please return to the Emergency Department if symptoms worsen.

## 2015-01-13 NOTE — ED Notes (Signed)
Pt woke up with right shoulder pain, no injury reported, no fall

## 2015-01-13 NOTE — ED Notes (Signed)
Patient ambulated successfully in hall with a walker. The patient uses a cane at home.

## 2015-01-13 NOTE — ED Notes (Signed)
EMS gave 50 mcg fentanyl in route

## 2015-01-13 NOTE — ED Notes (Signed)
Discharge information given, no further needs at this time.

## 2015-01-13 NOTE — ED Provider Notes (Signed)
CSN: [id number]     Arrival date & time 01/13/15  0555 History   First MD Initiated Contact with Patient 01/13/15 0601     Chief Complaint  Patient presents with  . Shoulder Pain     (Consider location/radiation/quality/duration/timing/severity/associated sxs/prior Treatment) HPI Comments: Pt is a 81 yo female who presents to the ED via EMS with complaint of right shoulder pain, onset PTA. Pt reports she woke up this morning due to pain in her right shoulder. She reports pain worsens with any movement of her right arm. Denies any recent injury, trauma or fall. Denies numbness, tingling, weakness, swelling. Pt endorses history of arthritis but notes that she has never injured her right shoulder.    Patient is a 81 y.o. female presenting with shoulder pain.  Shoulder Pain Associated symptoms: no fever     Past Medical History  Diagnosis Date  . Hypertension   . Deep venous thrombosis     bilateral legs  . Arthritis   . Lump or mass in breast   . Paresthesia   . Lumbar degenerative disc disease   . Carpal tunnel syndrome   . Allergic rhinitis   . Syncope 05/24/2013  . Vitamin D deficiency   . Degenerative arthritis     right knee   Past Surgical History  Procedure Laterality Date  . Total hip arthroplasty Right   . Abdominal hysterectomy    . Hemorroidectomy    . Ivc filter    . Cataract extraction Bilateral   . Carpal tunnel release Right   . Ulnar nerve transposition Right    Family History  Problem Relation Age of Onset  . Diabetes Mother   . Kidney failure Father   . Cancer - Lung Brother   . Cancer - Prostate Brother   . Kidney failure Son    Social History  Substance Use Topics  . Smoking status: Current Every Day Smoker -- 10.00 packs/day for 30 years  . Smokeless tobacco: Never Used  . Alcohol Use: Yes     Comment: occasional   OB History    No data available     Review of Systems  Constitutional: Negative for fever.  Musculoskeletal: Positive for  arthralgias. Negative for joint swelling.  Skin: Negative for wound.  Neurological: Negative for weakness and numbness.      Allergies  Codeine and Penicillins  Home Medications   Prior to Admission medications   Medication Sig Start Date End Date Taking? Authorizing Robbin Escher  albuterol (PROVENTIL HFA;VENTOLIN HFA) 108 (90 BASE) MCG/ACT inhaler Inhale 2 puffs into the lungs every 6 (six) hours as needed for wheezing or shortness of breath.    Historical Shantaya Bluestone, MD  amLODipine (NORVASC) 5 MG tablet Take 5 mg by mouth daily. 12/02/14   Historical Tiphany Fayson, MD  aspirin 81 MG tablet Take 81 mg by mouth daily.    Historical Sajad Glander, MD  ergocalciferol (VITAMIN D2) 50000 UNITS capsule Take 50,000 Units by mouth every Monday.     Historical Adhvik Canady, MD  gabapentin (NEURONTIN) 300 MG capsule Take 300 mg by mouth 2 (two) times daily.     Historical Keosha Rossa, MD  lisinopril-hydrochlorothiazide (PRINZIDE,ZESTORETIC) 20-12.5 MG per tablet Take 1 tablet by mouth daily.    Historical Terresa Marlett, MD  meclizine (ANTIVERT) 25 MG tablet Take 25 mg by mouth every 8 (eight) hours as needed for dizziness.  10/27/14   Historical Frazier Balfour, MD  Multiple Vitamin (MULTIVITAMIN WITH MINERALS) TABS tablet Take 1 tablet by mouth daily.  Historical Elvia Aydin, MD  traMADol (ULTRAM) 50 MG tablet TAKE 1 TABLET BY MOUTH 2-3 TIMES A DAY AS NEEDED FOR PAIN 10/27/14   Historical Mila Pair, MD   BP 135/66 mmHg  Pulse 72  Temp(Src) 97.7 F (36.5 C) (Oral)  Resp 12  SpO2 95% Physical Exam  Constitutional: She is oriented to person, place, and time. She appears well-developed and well-nourished.  HENT:  Head: Normocephalic and atraumatic.  Eyes: Conjunctivae and EOM are normal. Right eye exhibits no discharge. Left eye exhibits no discharge. No scleral icterus.  Pulmonary/Chest: Effort normal.  Musculoskeletal:       Right shoulder: She exhibits decreased range of motion and tenderness. She exhibits no swelling, no  effusion, no crepitus, no deformity and no laceration.  TTP at right shoulder and scapula region, dec ROM of right shoulder due to pain, FROM of right elbow, wrist and hand, 2+ radial pulses, sensation intact.   Neurological: She is alert and oriented to person, place, and time.  Nursing note and vitals reviewed.   ED Course  Procedures (including critical care time) Labs Review Labs Reviewed - No data to display  Imaging Review No results found. I have personally reviewed and evaluated these images and lab results as part of my medical decision-making.  Filed Vitals:   01/13/15 0833  BP: 139/75  Pulse: 68  Temp:   Resp: 14     MDM   Final diagnoses:  Musculoskeletal pain  Right shoulder pain    Pt presents with right shoulder pain, worse with movement of right arm. No reported injury, trauma or prior surgeries to right arm/shoulder. VSS. Right shoulder and scapula TTP. Dec. ROM of right shoulder due to pain. FROM at right elbow, wrist and hand. Arm neurovascularly intact.   Pt given pain meds. Xray reveals no fx or dislocation, mild glenohumeral joint osteoarthritis. I suspect pain is likely musculoskeletal in origin. Pt able to ambulate in hall with walker. Plan to d/c pt home with pain meds. Advised pt to follow up with PCP in 1 week.   Evaluation does not show pathology requring ongoing emergent intervention or admission. Pt is hemodynamically stable and mentating appropriately. Discussed findings/results and plan with patient/guardian, who agrees with plan. All questions answered. Return precautions discussed and outpatient follow up given.      Chesley Noon [place], [place] 01/13/15 [place], PA-C 01/13/15 2863  Quintella Reichert, MD 01/15/15 [phone]

## 2015-01-22 DIAGNOSIS — R42 Dizziness and giddiness: Secondary | ICD-10-CM | POA: Diagnosis not present

## 2015-01-22 DIAGNOSIS — M5136 Other intervertebral disc degeneration, lumbar region: Secondary | ICD-10-CM | POA: Diagnosis not present

## 2015-01-22 DIAGNOSIS — M19011 Primary osteoarthritis, right shoulder: Secondary | ICD-10-CM | POA: Diagnosis not present

## 2015-01-22 DIAGNOSIS — Z23 Encounter for immunization: Secondary | ICD-10-CM | POA: Diagnosis not present

## 2015-01-22 DIAGNOSIS — I10 Essential (primary) hypertension: Secondary | ICD-10-CM | POA: Diagnosis not present

## 2015-01-22 DIAGNOSIS — J449 Chronic obstructive pulmonary disease, unspecified: Secondary | ICD-10-CM | POA: Diagnosis not present

## 2015-01-28 DIAGNOSIS — M1711 Unilateral primary osteoarthritis, right knee: Secondary | ICD-10-CM | POA: Diagnosis not present

## 2015-01-28 DIAGNOSIS — M1712 Unilateral primary osteoarthritis, left knee: Secondary | ICD-10-CM | POA: Diagnosis not present

## 2015-02-09 DIAGNOSIS — M1712 Unilateral primary osteoarthritis, left knee: Secondary | ICD-10-CM | POA: Diagnosis not present

## 2015-02-09 DIAGNOSIS — M1711 Unilateral primary osteoarthritis, right knee: Secondary | ICD-10-CM | POA: Diagnosis not present

## 2015-03-19 DIAGNOSIS — Z719 Counseling, unspecified: Secondary | ICD-10-CM | POA: Diagnosis not present

## 2015-03-19 DIAGNOSIS — J449 Chronic obstructive pulmonary disease, unspecified: Secondary | ICD-10-CM | POA: Diagnosis not present

## 2015-03-19 DIAGNOSIS — M5136 Other intervertebral disc degeneration, lumbar region: Secondary | ICD-10-CM | POA: Diagnosis not present

## 2015-03-19 DIAGNOSIS — M179 Osteoarthritis of knee, unspecified: Secondary | ICD-10-CM | POA: Diagnosis not present

## 2015-03-19 DIAGNOSIS — F17200 Nicotine dependence, unspecified, uncomplicated: Secondary | ICD-10-CM | POA: Diagnosis not present

## 2015-03-19 DIAGNOSIS — I10 Essential (primary) hypertension: Secondary | ICD-10-CM | POA: Diagnosis not present

## 2015-04-15 DIAGNOSIS — H353112 Nonexudative age-related macular degeneration, right eye, intermediate dry stage: Secondary | ICD-10-CM | POA: Diagnosis not present

## 2015-04-15 DIAGNOSIS — H52223 Regular astigmatism, bilateral: Secondary | ICD-10-CM | POA: Diagnosis not present

## 2015-04-15 DIAGNOSIS — H5201 Hypermetropia, right eye: Secondary | ICD-10-CM | POA: Diagnosis not present

## 2015-04-15 DIAGNOSIS — H04123 Dry eye syndrome of bilateral lacrimal glands: Secondary | ICD-10-CM | POA: Diagnosis not present

## 2015-04-30 ENCOUNTER — Encounter (INDEPENDENT_AMBULATORY_CARE_PROVIDER_SITE_OTHER): Payer: Medicare Other | Admitting: Ophthalmology

## 2015-04-30 DIAGNOSIS — H353114 Nonexudative age-related macular degeneration, right eye, advanced atrophic with subfoveal involvement: Secondary | ICD-10-CM | POA: Diagnosis not present

## 2015-04-30 DIAGNOSIS — H353123 Nonexudative age-related macular degeneration, left eye, advanced atrophic without subfoveal involvement: Secondary | ICD-10-CM

## 2015-04-30 DIAGNOSIS — I10 Essential (primary) hypertension: Secondary | ICD-10-CM

## 2015-04-30 DIAGNOSIS — H43813 Vitreous degeneration, bilateral: Secondary | ICD-10-CM | POA: Diagnosis not present

## 2015-04-30 DIAGNOSIS — H35033 Hypertensive retinopathy, bilateral: Secondary | ICD-10-CM | POA: Diagnosis not present

## 2015-06-12 ENCOUNTER — Encounter (HOSPITAL_COMMUNITY): Payer: Self-pay | Admitting: Emergency Medicine

## 2015-06-12 ENCOUNTER — Emergency Department (INDEPENDENT_AMBULATORY_CARE_PROVIDER_SITE_OTHER)
Admission: EM | Admit: 2015-06-12 | Discharge: 2015-06-12 | Disposition: A | Discharge status: Home / Self Care | Payer: Medicare Other | Source: Home / Self Care

## 2015-06-12 DIAGNOSIS — J069 Acute upper respiratory infection, unspecified: Secondary | ICD-10-CM

## 2015-06-12 DIAGNOSIS — B9789 Other viral agents as the cause of diseases classified elsewhere: Principal | ICD-10-CM

## 2015-06-12 DIAGNOSIS — I10 Essential (primary) hypertension: Secondary | ICD-10-CM

## 2015-06-12 MED ORDER — IPRATROPIUM BROMIDE 0.06 % NA SOLN: 2.0000 | Freq: Four times a day (QID) | NASAL | Status: DC

## 2015-06-12 NOTE — ED Provider Notes (Signed)
CSN: [id number]     Arrival date & time 06/12/15  1309 History   None    Chief Complaint  Patient presents with  . URI   (Consider location/radiation/quality/duration/timing/severity/associated sxs/prior Treatment) HPI  Cough and cold symptoms.  Started 3 days ago gettign worse Constant w/ waxing and waning nature otc cough meds w/o benefit.  Denies fevers, nausea, vomiting and diarrhea.  Now w/ HA when coughing and chest discomfot only when coughing.     Past Medical History  Diagnosis Date  . Hypertension   . Deep venous thrombosis (HCC)     bilateral legs  . Arthritis   . Lump or mass in breast   . Paresthesia   . Lumbar degenerative disc disease   . Carpal tunnel syndrome   . Allergic rhinitis   . Syncope 05/24/2013  . Vitamin D deficiency   . Degenerative arthritis     right knee   Past Surgical History  Procedure Laterality Date  . Total hip arthroplasty Right   . Abdominal hysterectomy    . Hemorroidectomy    . Ivc filter    . Cataract extraction Bilateral   . Carpal tunnel release Right   . Ulnar nerve transposition Right    Family History  Problem Relation Age of Onset  . Diabetes Mother   . Kidney failure Father   . Cancer - Lung Brother   . Cancer - Prostate Brother   . Kidney failure Son    Social History  Substance Use Topics  . Smoking status: Current Every Day Smoker -- 10.00 packs/day for 30 years  . Smokeless tobacco: Never Used  . Alcohol Use: Yes     Comment: occasional   OB History    No data available     Review of Systems Per HPI with all other pertinent systems negative.   Allergies  Codeine and Penicillins  Home Medications   Prior to Admission medications   Medication Sig Start Date End Date Taking? Authorizing Provider  albuterol (PROVENTIL HFA;VENTOLIN HFA) 108 (90 BASE) MCG/ACT inhaler Inhale 2 puffs into the lungs every 6 (six) hours as needed for wheezing or shortness of breath.   Yes Historical Provider, MD   amLODipine (NORVASC) 5 MG tablet Take 5 mg by mouth daily. 12/02/14  Yes Historical Provider, MD  lisinopril-hydrochlorothiazide (PRINZIDE,ZESTORETIC) 20-12.5 MG per tablet Take 1 tablet by mouth daily.   Yes Historical Provider, MD  aspirin 81 MG tablet Take 81 mg by mouth daily.    Historical Provider, MD  ergocalciferol (VITAMIN D2) 50000 UNITS capsule Take 50,000 Units by mouth every Monday.     Historical Provider, MD  gabapentin (NEURONTIN) 300 MG capsule Take 300 mg by mouth 2 (two) times daily.     Historical Provider, MD  HYDROcodone-acetaminophen (NORCO/VICODIN) 5-325 MG per tablet Take 1 tablet by mouth every 4 (four) hours as needed. 01/13/15   Nona Dell, PA-C  ipratropium (ATROVENT) 0.06 % nasal spray Place 2 sprays into both nostrils 4 (four) times daily. 06/12/15   Waldemar Dickens, MD  meclizine (ANTIVERT) 25 MG tablet Take 25 mg by mouth every 8 (eight) hours as needed for dizziness.  10/27/14   Historical Provider, MD  Multiple Vitamin (MULTIVITAMIN WITH MINERALS) TABS tablet Take 1 tablet by mouth daily.    Historical Provider, MD  traMADol (ULTRAM) 50 MG tablet TAKE 1 TABLET BY MOUTH 2-3 TIMES A DAY AS NEEDED FOR PAIN 10/27/14   Historical Provider, MD   Meds Ordered and  Administered this Visit  Medications - No data to display  BP 171/82 mmHg  Pulse 89  Temp(Src) 98 F (36.7 C) (Oral)  Resp 16  SpO2 97% No data found.   Physical Exam  Physical Exam  Constitutional: oriented to person, place, and time. appears well-developed and well-nourished. No distress.  HENT:  Pharyngeal cobblestoning, no maxillary or frontal sinus tenderness to palpation Head: Normocephalic and atraumatic.  Eyes: EOMI. PERRL.  Neck: Normal range of motion.  Cardiovascular: RRR, no m/r/g, 2+ distal pulses,  Pulmonary/Chest: Effort normal and breath sounds normal. No respiratory distress.  Abdominal: Soft. Bowel sounds are normal. NonTTP, no distension.  Musculoskeletal: Normal  range of motion. Non ttp, no effusion.  Neurological: alert and oriented to person, place, and time.  Skin: Skin is warm. No rash noted. non diaphoretic.  Psychiatric: normal mood and affect. behavior is normal. Judgment and thought content normal.    ED Course  Procedures (including critical care time)  Labs Review Labs Reviewed - No data to display  Imaging Review No results found.   Visual Acuity Review  Right Eye Distance:   Left Eye Distance:   Bilateral Distance:    Right Eye Near:   Left Eye Near:    Bilateral Near:         MDM   1. Viral URI with cough   2. Essential hypertension    Patient not take her blood pressure medications this morning which is important reason why her blood pressures as high as it is. Counseling patient to take her blood pressure medications. Suspect this is a viral infection and will likely resolve over the course of the next few days. Patient with strict return precautions given. Patient also given prescription for nasal Atrovent to help with her symptoms.    Waldemar Dickens, MD 06/12/15 1434

## 2015-06-12 NOTE — Discharge Instructions (Signed)
You have a viral illness Please take ibuprofen 400-600mg  every 6 hours, mucinex, sudafed (only if you take your blood pressure medications), and the nasal atrovent for your symptoms Please follow up with your doctor if you are not better in another 3-5 days of if you develop fevers and shortness of breath

## 2015-06-12 NOTE — ED Notes (Signed)
C/o cold sx onset Tuesday associated w/prod cough, CP due to cough, runny nose, congestion Taking OTC cold meds w/no relief A&O x4... No acute distress.

## 2015-09-22 DIAGNOSIS — H9113 Presbycusis, bilateral: Secondary | ICD-10-CM | POA: Insufficient documentation

## 2015-10-03 ENCOUNTER — Encounter (HOSPITAL_COMMUNITY): Payer: Self-pay | Admitting: Emergency Medicine

## 2015-10-03 ENCOUNTER — Ambulatory Visit (HOSPITAL_COMMUNITY)
Admission: EM | Admit: 2015-10-03 | Discharge: 2015-10-03 | Disposition: A | Discharge status: Home / Self Care | Payer: Medicare Other | Attending: Emergency Medicine | Admitting: Emergency Medicine

## 2015-10-03 DIAGNOSIS — J069 Acute upper respiratory infection, unspecified: Secondary | ICD-10-CM

## 2015-10-03 DIAGNOSIS — J189 Pneumonia, unspecified organism: Secondary | ICD-10-CM | POA: Diagnosis not present

## 2015-10-03 MED ORDER — AZITHROMYCIN 500 MG PO TABS: 500.0000 mg | ORAL_TABLET | Freq: Every day | ORAL | Status: DC

## 2015-10-03 MED ORDER — AEROCHAMBER PLUS MISC
Status: DC
Start: 2015-10-03 — End: 2017-12-17

## 2015-10-03 MED ORDER — BENZONATATE 200 MG PO CAPS: 200.0000 mg | ORAL_CAPSULE | Freq: Three times a day (TID) | ORAL | Status: DC | PRN

## 2015-10-03 MED ORDER — ALBUTEROL SULFATE HFA 108 (90 BASE) MCG/ACT IN AERS: 2.0000 | INHALATION_SPRAY | Freq: Four times a day (QID) | RESPIRATORY_TRACT | Status: DC | PRN

## 2015-10-03 NOTE — ED Provider Notes (Signed)
HPI  SUBJECTIVE:  [name] is a 82 y.o. female who presents with fevers Tmax 102, nasal congestion, clear rhinorrhea, postnasal drip, sneezing, coughing productive of sputum, chest soreness and headache from the coughing. She reports a scratchy, mildly sore throat. She has tried Tylenol, ibuprofen and over-the-counter cough syrup without relief. There are no aggravating or alleviating factors She denies vomiting, sinus pain and pressure, purulent nasal drainage, ear pain, photophobia, other headache, neck stiffness, rash, bodyaches, known tick bite. No abdominal pain, urinary complaints, back pain, known abscesses or boils. No known sick contacts. Past medical history of hypertension, asthma, no history of diabetes. Patient has current smoker. PMD: Dr. Rennis Petty    Past Medical History  Diagnosis Date  . Hypertension   . Deep venous thrombosis (HCC)     bilateral legs  . Arthritis   . Lump or mass in breast   . Paresthesia   . Lumbar degenerative disc disease   . Carpal tunnel syndrome   . Allergic rhinitis   . Syncope 05/24/2013  . Vitamin D deficiency   . Degenerative arthritis     right knee    Past Surgical History  Procedure Laterality Date  . Total hip arthroplasty Right   . Abdominal hysterectomy    . Hemorroidectomy    . Ivc filter    . Cataract extraction Bilateral   . Carpal tunnel release Right   . Ulnar nerve transposition Right     Family History  Problem Relation Age of Onset  . Diabetes Mother   . Kidney failure Father   . Cancer - Lung Brother   . Cancer - Prostate Brother   . Kidney failure Son     Social History  Substance Use Topics  . Smoking status: Current Every Day Smoker -- 10.00 packs/day for 30 years  . Smokeless tobacco: Never Used  . Alcohol Use: Yes     Comment: occasional    No current facility-administered medications for this encounter.  Current outpatient prescriptions:  .  gabapentin (NEURONTIN) 300 MG capsule, Take 300  mg by mouth 2 (two) times daily. , Disp: , Rfl:  .  lisinopril-hydrochlorothiazide (PRINZIDE,ZESTORETIC) 20-12.5 MG per tablet, Take 1 tablet by mouth daily., Disp: , Rfl:  .  albuterol (PROVENTIL HFA;VENTOLIN HFA) 108 (90 Base) MCG/ACT inhaler, Inhale 2 puffs into the lungs every 6 (six) hours as needed for wheezing or shortness of breath., Disp: 1 Inhaler, Rfl: 0 .  amLODipine (NORVASC) 5 MG tablet, Take 5 mg by mouth daily., Disp: , Rfl: 2 .  aspirin 81 MG tablet, Take 81 mg by mouth daily., Disp: , Rfl:  .  azithromycin (ZITHROMAX) 500 MG tablet, Take 1 tablet (500 mg total) by mouth daily., Disp: 5 tablet, Rfl: 0 .  benzonatate (TESSALON) 200 MG capsule, Take 1 capsule (200 mg total) by mouth 3 (three) times daily as needed for cough., Disp: 20 capsule, Rfl: 0 .  ergocalciferol (VITAMIN D2) 50000 UNITS capsule, Take 50,000 Units by mouth every Monday. , Disp: , Rfl:  .  ipratropium (ATROVENT) 0.06 % nasal spray, Place 2 sprays into both nostrils 4 (four) times daily., Disp: 15 mL, Rfl: 12 .  meclizine (ANTIVERT) 25 MG tablet, Take 25 mg by mouth every 8 (eight) hours as needed for dizziness. , Disp: , Rfl: 5 .  Multiple Vitamin (MULTIVITAMIN WITH MINERALS) TABS tablet, Take 1 tablet by mouth daily., Disp: , Rfl:  .  Spacer/Aero-Holding Chambers (AEROCHAMBER PLUS) inhaler, Use as instructed, Disp: 1  each, Rfl: 2  Allergies  Allergen Reactions  . Codeine Other (See Comments)    Pass out  . Penicillins Other (See Comments)    Pass out     ROS  As noted in HPI.   Physical Exam  BP 134/70 mmHg  Pulse 77  Temp(Src) 98.2 F (36.8 C) (Oral)  SpO2 96%  Constitutional: Well developed, well nourished, no acute distress Eyes: PERRL, EOMI, conjunctiva normal bilaterally HENT: Normocephalic, atraumatic,mucus membranes moist. TMs normal bilaterally. Positive clear nasal congestion, no sinus tenderness. Oropharynx normal, uvula midline, no postnasal drip. Lymph: No cervical  lymphadenopathy Neck: No meningismus Respiratory: Good air movement, good inspiratory effort, occasional wheezing, rhonchi left lower lobe Cardiovascular: Normal rate and rhythm, no murmurs, no gallops, no rubs GI: Soft, nondistended, normal bowel sounds, nontender, no rebound, no guarding Back: no CVAT skin: No rash, skin intact Musculoskeletal: No edema, no tenderness, no deformities Neurologic: Alert & oriented x 3, CN II-XII grossly intact, no motor deficits, sensation grossly intact Psychiatric: Speech and behavior appropriate   ED Course   Medications - No data to display  No orders of the defined types were placed in this encounter.   No results found for this or any previous visit (from the past 24 hour(s)). No results found.  ED Clinical Impression  CAP (community acquired pneumonia)  URI (upper respiratory infection)  ED Assessment/Plan  Given rhonchi and history of fever, we'll treat empirically as pneumonia. Deferring x-ray as it would not change management. No evidence of otitis, sinusitis, meningitis, intra-abdominal process or UTI. Given that she is a smoker and has history of asthma sending home with high dose azithromycin 500 mg daily for 5 days, albuterol with spacer, Tessalon Perles. She is to start plain Mucinex, saline nasal irrigation. Discussed  MDM, plan and followup with patient. Discussed sn/sx that should prompt return to the  ED. Patient  agrees with plan.   *This clinic note was created using Dragon dictation software. Therefore, there may be occasional mistakes despite careful proofreading.  ?  Melynda Ripple, MD 10/05/15 1103

## 2015-10-03 NOTE — ED Notes (Signed)
C/o cold sx onset x2 days associated w/prod cough, sneezing, CP due to cough, HA, and fevers... Taking OTC cold meds w/no relief.... A&O x4... No acute distress.

## 2016-02-01 ENCOUNTER — Ambulatory Visit (INDEPENDENT_AMBULATORY_CARE_PROVIDER_SITE_OTHER): Payer: Medicare Other | Admitting: Physician Assistant

## 2016-02-18 ENCOUNTER — Ambulatory Visit (INDEPENDENT_AMBULATORY_CARE_PROVIDER_SITE_OTHER): Payer: Medicare Other | Admitting: Physician Assistant

## 2016-02-18 ENCOUNTER — Ambulatory Visit (INDEPENDENT_AMBULATORY_CARE_PROVIDER_SITE_OTHER): Payer: Medicare Other

## 2016-02-18 ENCOUNTER — Encounter (INDEPENDENT_AMBULATORY_CARE_PROVIDER_SITE_OTHER): Payer: Self-pay | Admitting: Physician Assistant

## 2016-02-18 DIAGNOSIS — M25551 Pain in right hip: Secondary | ICD-10-CM | POA: Diagnosis not present

## 2016-02-18 DIAGNOSIS — M1712 Unilateral primary osteoarthritis, left knee: Secondary | ICD-10-CM

## 2016-02-18 DIAGNOSIS — M1711 Unilateral primary osteoarthritis, right knee: Secondary | ICD-10-CM

## 2016-02-18 DIAGNOSIS — M1710 Unilateral primary osteoarthritis, unspecified knee: Secondary | ICD-10-CM

## 2016-02-18 MED ORDER — DICLOFENAC SODIUM 1 % TD GEL: 4.0000 g | Freq: Four times a day (QID) | TRANSDERMAL | 0 refills | Status: DC

## 2016-02-18 MED ORDER — HYALURONAN 88 MG/4ML IX SOSY
88.0000 mg | PREFILLED_SYRINGE | INTRA_ARTICULAR | Status: AC | PRN
  Administered 2016-02-18: 88 mg via INTRA_ARTICULAR

## 2016-02-18 MED ORDER — DICLOFENAC SODIUM 1 % TD GEL: 4.0000 g | Freq: Four times a day (QID) | TRANSDERMAL | Status: DC

## 2016-02-18 NOTE — Addendum Note (Signed)
Addended by: Precious Bard on: 02/18/2016 03:33 PM   Modules accepted: Orders

## 2016-02-18 NOTE — Progress Notes (Signed)
   Procedure Note  Patient: [name]             Date of Birth: 1929/02/28           MRN: [id number]             Visit Date: 02/18/2016  Procedures: Visit Diagnoses: Pain in right hip - Plan: XR Pelvis 1-2 Views  Arthritis of knee - Plan: Large Joint Injection/Arthrocentesis, Large Joint Injection/Arthrocentesis  Large Joint Inj Date/Time: 02/18/2016 2:03 PM Performed by: Pete Pelt Authorized by: Pete Pelt   Location:  Knee Site:  R knee Needle Size:  22 G Needle Length:  1.5 inches Approach:  Anterolateral Ultrasound Guidance: No   Fluoroscopic Guidance: No   Arthrogram: No Aspiration Attempted: No   Patient tolerance:  Patient tolerated the procedure well with no immediate complications  Large Joint Inj Date/Time: 02/18/2016 2:08 PM Performed by: Pete Pelt Authorized by: Pete Pelt   Location:  Knee Site:  L knee Needle Size:  22 G Needle Length:  1.5 inches Approach:  Anterolateral Ultrasound Guidance: No   Fluoroscopic Guidance: No   Arthrogram: No Medications:  88 mg Hyaluronan 88 MG/4ML Aspiration Attempted: No   Patient tolerance:  Patient tolerated the procedure well with no immediate complications   HPI: [name] is here for injections both knees today with Monovisc. She is having right hip pain, over the anterior aspect of the thigh. History of right total hip arthroplasty. He denies any numbness or tingling down the right leg. Pain is with standing only.   PE: Right hip good range of motion without pain. No pain over the right trochanteric region. Ambulates without any assistive device.  RADS: AP pelvis shows a well-seated right total hip arthroplasty without complication. No acute fracture, left hip with mild arthritic changes. No bony abnormalities otherwise

## 2016-02-18 NOTE — Addendum Note (Signed)
Addended by: Precious Bard on: 02/18/2016 03:20 PM   Modules accepted: Orders

## 2016-02-18 NOTE — Progress Notes (Deleted)
   Office Visit Note   Patient: [name]           Date of Birth: 20-May-1928           MRN: [id number] Visit Date: 02/18/2016              Requested by: Nolene Ebbs, MD [address] [place], [place] [address] PCP: Philis Fendt, MD   Assessment & Plan: Visit Diagnoses: No diagnosis found.  Plan: ***  Follow-Up Instructions: No Follow-up on file.   Orders:  No orders of the defined types were placed in this encounter.  No orders of the defined types were placed in this encounter.     Procedures: No procedures performed   Clinical Data: No additional findings.   Subjective: Chief Complaint  Patient presents with  . Right Knee - Pain  . Left Knee - Pain    HPI  Review of Systems   Objective: Vital Signs: There were no vitals taken for this visit.  Physical Exam  Ortho Exam  Specialty Comments:  No specialty comments available.  Imaging: No results found.   PMFS History: Patient Active Problem List   Diagnosis Date Noted  . Syncope 05/24/2013   Past Medical History:  Diagnosis Date  . Allergic rhinitis   . Arthritis   . Carpal tunnel syndrome   . Deep venous thrombosis (HCC)    bilateral legs  . Degenerative arthritis    right knee  . Hypertension   . Lumbar degenerative disc disease   . Lump or mass in breast   . Paresthesia   . Syncope 05/24/2013  . Vitamin D deficiency     Family History  Problem Relation Age of Onset  . Diabetes Mother   . Kidney failure Father   . Cancer - Lung Brother   . Cancer - Prostate Brother   . Kidney failure Son     Past Surgical History:  Procedure Laterality Date  . ABDOMINAL HYSTERECTOMY    . CARPAL TUNNEL RELEASE Right   . CATARACT EXTRACTION Bilateral   . HEMORROIDECTOMY    . IVC Filter    . TOTAL HIP ARTHROPLASTY Right   . ULNAR NERVE TRANSPOSITION Right    Social History   Occupational History  . retired    Social History Main Topics  . Smoking status: Current Every  Day Smoker    Packs/day: 10.00    Years: 30.00  . Smokeless tobacco: Never Used  . Alcohol use Yes     Comment: occasional  . Drug use: No  . Sexual activity: Not on file

## 2016-03-17 ENCOUNTER — Ambulatory Visit (INDEPENDENT_AMBULATORY_CARE_PROVIDER_SITE_OTHER): Payer: Self-pay | Admitting: Physician Assistant

## 2016-09-21 ENCOUNTER — Encounter (INDEPENDENT_AMBULATORY_CARE_PROVIDER_SITE_OTHER): Payer: Self-pay | Admitting: Physician Assistant

## 2016-09-21 ENCOUNTER — Ambulatory Visit (INDEPENDENT_AMBULATORY_CARE_PROVIDER_SITE_OTHER): Payer: Medicare Other | Admitting: Physician Assistant

## 2016-09-21 DIAGNOSIS — M1711 Unilateral primary osteoarthritis, right knee: Secondary | ICD-10-CM

## 2016-09-21 DIAGNOSIS — M1712 Unilateral primary osteoarthritis, left knee: Secondary | ICD-10-CM | POA: Diagnosis not present

## 2016-09-21 DIAGNOSIS — M170 Bilateral primary osteoarthritis of knee: Secondary | ICD-10-CM | POA: Diagnosis not present

## 2016-09-21 MED ORDER — TRAMADOL HCL 50 MG PO TABS: 50.0000 mg | ORAL_TABLET | Freq: Four times a day (QID) | ORAL | 0 refills | Status: DC | PRN

## 2016-09-21 MED ORDER — HYALURONAN 88 MG/4ML IX SOSY
88.0000 mg | PREFILLED_SYRINGE | INTRA_ARTICULAR | Status: AC | PRN
  Administered 2016-09-21: 88 mg via INTRA_ARTICULAR

## 2016-09-21 NOTE — Progress Notes (Signed)
   Procedure Note  Patient: [name]             Date of Birth: [birth year]           MRN: [id number]             Visit Date: 09/21/2016   [name] is well known to Dr. Trevor Mace practice comes in today for mono this injection both knees. She has known osteoarthritis both knees. She's had no new injury. Procedures: Visit Diagnoses: Bilateral knee osteoarthritis  Large Joint Inj Date/Time: 09/21/2016 3:30 PM Performed by: Pete Pelt Authorized by: Pete Pelt   Location:  Knee Site:  R knee Needle Size:  22 G Needle Length:  1.5 inches Approach:  Anterolateral Ultrasound Guidance: No   Fluoroscopic Guidance: No   Arthrogram: No   Medications:  88 mg Hyaluronan 88 MG/4ML Aspiration Attempted: No   Patient tolerance:  Patient tolerated the procedure well with no immediate complications Large Joint Inj Date/Time: 09/21/2016 3:31 PM Performed by: Pete Pelt Authorized by: Pete Pelt   Location:  Knee Site:  L knee Needle Size:  22 G Needle Length:  1.5 inches Approach:  Anterolateral Ultrasound Guidance: No   Fluoroscopic Guidance: No   Arthrogram: No   Aspiration Attempted: No   Patient tolerance:  Patient tolerated the procedure well with no immediate complications   Plan: [name] will follow [place] in about 6 months for another round of monomer this injections both knees. She knows to call our office 1 month prior to this gain approval for the injections.

## 2017-02-27 ENCOUNTER — Telehealth (INDEPENDENT_AMBULATORY_CARE_PROVIDER_SITE_OTHER): Payer: Self-pay | Admitting: Physician Assistant

## 2017-02-27 NOTE — Telephone Encounter (Signed)
Patient request a call back regarding, the time frame that she can get another Monovisc Injection and if so can we get approval from her insurance first

## 2017-03-02 ENCOUNTER — Encounter (INDEPENDENT_AMBULATORY_CARE_PROVIDER_SITE_OTHER): Payer: Self-pay | Admitting: Physician Assistant

## 2017-03-02 ENCOUNTER — Ambulatory Visit (INDEPENDENT_AMBULATORY_CARE_PROVIDER_SITE_OTHER): Payer: Medicare Other | Admitting: Physician Assistant

## 2017-03-02 DIAGNOSIS — M1711 Unilateral primary osteoarthritis, right knee: Secondary | ICD-10-CM | POA: Diagnosis not present

## 2017-03-02 DIAGNOSIS — M170 Bilateral primary osteoarthritis of knee: Secondary | ICD-10-CM

## 2017-03-02 DIAGNOSIS — M1712 Unilateral primary osteoarthritis, left knee: Secondary | ICD-10-CM

## 2017-03-02 MED ORDER — LIDOCAINE HCL 1 % IJ SOLN
3.0000 mL | INTRAMUSCULAR | Status: AC | PRN
  Administered 2017-03-02: 3 mL

## 2017-03-02 MED ORDER — METHYLPREDNISOLONE ACETATE 40 MG/ML IJ SUSP
40.0000 mg | INTRAMUSCULAR | Status: AC | PRN
  Administered 2017-03-02: 40 mg via INTRA_ARTICULAR

## 2017-03-02 NOTE — Progress Notes (Signed)
   Procedure Note  Patient: [name]             Date of Birth: January 26, 1929           MRN: [id number]             Visit Date: 03/02/2017  HPI: [name] is well-known to Dr. Delilah Shan service comes in today due to bilateral knee pain.  She states her knee pain began about 6 weeks ago.  She has had no known injury.  She has known osteoarthritis of both knees.  Last underwent Monovisc injections both knees back in June and did well until 6 weeks ago.  She is having some giving way of both knees states that her right knee feels weaker than her left.  Tried Tylenol and Voltaren gel without a lot of relief.  Requesting injections in both knees today.  Review of systems: No fevers, chills, nausea or vomiting  Procedures: Visit Diagnoses: Primary osteoarthritis of both knees  Large Joint Inj: bilateral knee on 03/02/2017 2:09 PM Indications: pain Details: 22 G 1.5 in needle, anterolateral approach  Arthrogram: No  Medications (Right): 3 mL lidocaine 1 %; 40 mg methylPREDNISolone acetate 40 MG/ML Medications (Left): 3 mL lidocaine 1 %; 40 mg methylPREDNISolone acetate 40 MG/ML Outcome: tolerated well, no immediate complications Procedure, treatment alternatives, risks and benefits explained, specific risks discussed. Consent was given by the patient. Immediately prior to procedure a time out was called to verify the correct patient, procedure, equipment, support staff and site/side marked as required. Patient was prepped and draped in the usual sterile fashion.     Plan: We will see her back in early January for Monovisc injections both knees.  Questions encouraged and answered.

## 2017-03-02 NOTE — Telephone Encounter (Signed)
Patient aware to call back around mid December for me to order Monovisc

## 2017-03-23 ENCOUNTER — Ambulatory Visit (INDEPENDENT_AMBULATORY_CARE_PROVIDER_SITE_OTHER): Payer: Self-pay | Admitting: Physician Assistant

## 2017-03-31 ENCOUNTER — Telehealth (INDEPENDENT_AMBULATORY_CARE_PROVIDER_SITE_OTHER): Payer: Self-pay | Admitting: Physician Assistant

## 2017-03-31 NOTE — Telephone Encounter (Signed)
Called patient no answer and no answering machine pickup. Patient need to schedule monovisc injection  [phone]

## 2017-04-24 ENCOUNTER — Encounter (INDEPENDENT_AMBULATORY_CARE_PROVIDER_SITE_OTHER): Payer: Self-pay | Admitting: Physician Assistant

## 2017-04-24 ENCOUNTER — Ambulatory Visit (INDEPENDENT_AMBULATORY_CARE_PROVIDER_SITE_OTHER): Payer: Medicare Other | Admitting: Physician Assistant

## 2017-04-24 VITALS — Ht 69.5 in | Wt 230.0 lb

## 2017-04-24 DIAGNOSIS — M1712 Unilateral primary osteoarthritis, left knee: Secondary | ICD-10-CM

## 2017-04-24 DIAGNOSIS — M1711 Unilateral primary osteoarthritis, right knee: Secondary | ICD-10-CM

## 2017-04-24 DIAGNOSIS — M170 Bilateral primary osteoarthritis of knee: Secondary | ICD-10-CM

## 2017-04-24 MED ORDER — HYALURONAN 88 MG/4ML IX SOSY
88.0000 mg | PREFILLED_SYRINGE | INTRA_ARTICULAR | Status: AC | PRN
Start: 2017-04-24 — End: 2017-04-24
  Administered 2017-04-24: 88 mg via INTRA_ARTICULAR

## 2017-04-24 MED ORDER — HYALURONAN 88 MG/4ML IX SOSY
88.0000 mg | PREFILLED_SYRINGE | INTRA_ARTICULAR | Status: AC | PRN
  Administered 2017-04-24: 88 mg via INTRA_ARTICULAR

## 2017-04-24 MED ORDER — TRAMADOL HCL 50 MG PO TABS: 50.0000 mg | ORAL_TABLET | Freq: Four times a day (QID) | ORAL | 0 refills | Status: DC | PRN

## 2017-04-24 NOTE — Progress Notes (Signed)
   Procedure Note  Patient: [name]             Date of Birth: 03/31/1929           MRN: [id number]             Visit Date: 04/24/2017   HPI: [name] comes in today for bilateral Monovisc injections both knees.  She states she has had significant pain in both knees and has been mostly month of December the bed due to the pain.  She is asking for refill on her tramadol which does help some with her pain.  She has known osteoarthritis of both knees.  Physical exam bilateral knees full extension flexion to beyond 90 degrees.  No effusion abnormal warmth erythema.  Procedures: Visit Diagnoses: No diagnosis found.  Large Joint Inj: bilateral knee on 04/24/2017 11:03 AM Indications: pain Details: 22 G 1.5 in needle, anterolateral approach  Arthrogram: No  Medications (Right): 88 mg Hyaluronan 88 MG/4ML Medications (Left): 88 mg Hyaluronan 88 MG/4ML Outcome: tolerated well, no immediate complications Procedure, treatment alternatives, risks and benefits explained, specific risks discussed. Consent was given by the patient. Immediately prior to procedure a time out was called to verify the correct patient, procedure, equipment, support staff and site/side marked as required. Patient was prepped and draped in the usual sterile fashion.     Plan: She will follow-up with [place] in 8 weeks to see how she is done with the injections.  She is given a refill on her tramadol.

## 2017-04-27 ENCOUNTER — Ambulatory Visit (INDEPENDENT_AMBULATORY_CARE_PROVIDER_SITE_OTHER): Payer: Medicare Other | Admitting: Physician Assistant

## 2017-06-05 ENCOUNTER — Other Ambulatory Visit: Payer: Self-pay

## 2017-06-05 ENCOUNTER — Encounter (HOSPITAL_COMMUNITY): Payer: Self-pay | Admitting: Emergency Medicine

## 2017-06-05 ENCOUNTER — Ambulatory Visit (HOSPITAL_COMMUNITY)
Admission: EM | Admit: 2017-06-05 | Discharge: 2017-06-05 | Disposition: A | Discharge status: Home / Self Care | Payer: Medicare Other | Attending: Family Medicine | Admitting: Family Medicine

## 2017-06-05 DIAGNOSIS — I10 Essential (primary) hypertension: Secondary | ICD-10-CM

## 2017-06-05 NOTE — ED Provider Notes (Signed)
[facility]    CSN: [id number] Arrival date & time: 06/05/17  1718     History   Chief Complaint Chief Complaint  Patient presents with  . Hypertension    HPI [name] is a 84 y.o. female.   HPI  Hypertension Patient presents for hypertension concerns. She does monitor home blood pressures. Blood pressures ranging on average from 170-200's/80-90's. She is compliant with medications- amlodipine 5 mg/d, Prinzide 20-12.5 mg/d. Patient has these side effects of medication: none She is adhering to a healthy diet overall with no recent changes. She has been undergoing stress in life. Denies CP, SOB, confusion, vision changes.   Past Medical History:  Diagnosis Date  . Allergic rhinitis   . Arthritis   . Carpal tunnel syndrome   . Deep venous thrombosis (HCC)    bilateral legs  . Degenerative arthritis    right knee  . Hypertension   . Lumbar degenerative disc disease   . Lump or mass in breast   . Paresthesia   . Syncope 05/24/2013  . Vitamin D deficiency     Patient Active Problem List   Diagnosis Date Noted  . Syncope 05/24/2013    Past Surgical History:  Procedure Laterality Date  . ABDOMINAL HYSTERECTOMY    . CARPAL TUNNEL RELEASE Right   . CATARACT EXTRACTION Bilateral   . HEMORROIDECTOMY    . IVC Filter    . TOTAL HIP ARTHROPLASTY Right   . ULNAR NERVE TRANSPOSITION Right     Home Medications    Prior to Admission medications   Medication Sig Start Date End Date Taking? Authorizing Provider  lisinopril-hydrochlorothiazide (PRINZIDE,ZESTORETIC) 20-12.5 MG per tablet Take 1 tablet by mouth daily.   Yes [provider]  albuterol (PROVENTIL HFA;VENTOLIN HFA) 108 (90 Base) MCG/ACT inhaler Inhale 2 puffs into the lungs every 6 (six) hours as needed for wheezing or shortness of breath. 10/03/15   Melynda Ripple, MD  amLODipine (NORVASC) 5 MG tablet Take 2 tablets (10 mg total) by mouth daily. 06/05/17   Shelda Pal, DO  aspirin 81 MG tablet Take 81 mg by mouth daily.    [provider]  diclofenac sodium (VOLTAREN) 1 % GEL Apply 4 g topically 4 (four) times daily. 02/18/16   Pete Pelt, PA-C  ergocalciferol (VITAMIN D2) 50000 UNITS capsule Take 50,000 Units by mouth every Monday.     [provider]  gabapentin (NEURONTIN) 300 MG capsule Take 300 mg by mouth 2 (two) times daily.     [provider]  ipratropium (ATROVENT) 0.06 % nasal spray Place 2 sprays into both nostrils 4 (four) times daily. 06/12/15   Waldemar Dickens, MD  meclizine (ANTIVERT) 25 MG tablet Take 25 mg by mouth every 8 (eight) hours as needed for dizziness.  10/27/14   [provider]  Multiple Vitamin (MULTIVITAMIN WITH MINERALS) TABS tablet Take 1 tablet by mouth daily.    [provider]  Spacer/Aero-Holding Chambers (AEROCHAMBER PLUS) inhaler Use as instructed 10/03/15   Melynda Ripple, MD  traMADol (ULTRAM) 50 MG tablet Take 1 tablet (50 mg total) by mouth every 6 (six) hours as needed. 04/24/17   Pete Pelt, PA-C    Family History Family History  Problem Relation Age of Onset  . Diabetes Mother   . Kidney failure Father   . Cancer - Lung Brother   . Cancer - Prostate Brother   . Kidney failure Son     Social History  Social History   Tobacco Use  . Smoking status: Current Every Day Smoker    Packs/day: 10.00    Years: 30.00    Pack years: 300.00  . Smokeless tobacco: Never Used  Substance Use Topics  . Alcohol use: Yes    Comment: occasional  . Drug use: No     Allergies   Codeine and Penicillins   Review of Systems Review of Systems  Respiratory: Negative for shortness of breath.   Cardiovascular: Negative for chest pain.     Physical Exam Triage Vital Signs ED Triage Vitals [06/05/17 1739]  Enc Vitals Group     BP (!) 154/61     Pulse Rate 82     Resp 20     Temp 98.4 F (36.9 C)     Temp Source Oral     SpO2 100 %   Updated Vital  Signs BP (!) 154/61 (BP Location: Left Arm)   Pulse 82   Temp 98.4 F (36.9 C) (Oral)   Resp 20   SpO2 100%   Physical Exam  Constitutional: She is oriented to person, place, and time. She appears well-developed.  HENT:  Head: Normocephalic.  Eyes: Pupils are equal, round, and reactive to light.  Cardiovascular: Normal rate and regular rhythm.  Pulmonary/Chest: Effort normal and breath sounds normal. No respiratory distress.  Neurological: She is alert and oriented to person, place, and time.  Skin: Skin is warm.  Psychiatric: She has a normal mood and affect. Judgment normal.     UC Treatments / Results  Procedures Procedures none  Initial Impression / Assessment and Plan / UC Course  I have reviewed the triage vital signs and the nursing notes.  Pertinent labs & imaging results that were available during my care of the patient were reviewed by me and considered in my medical decision making (see chart for details).     84 yo F presents with elevated home BP readings. She is asymptomatic in the office. Thus, I did not feel EKG or referral to ED is warranted. I think she should bring her BP monitor to her PCP's office to verify they are accurate readings. Scale back on home readings as this is likely causing more stress. Warning s/s's verbalized and written down in AVS for when to seek immediate care. We will increase dose of amlodipine from 5 mg/d to 10 mg/d until she can see her pcp, hopefully this week. Pt voiced understanding and agreement to the plan.   Final Clinical Impressions(s) / UC Diagnoses   Final diagnoses:  Essential hypertension   Controlled Substance Prescriptions [place] Controlled Substance Registry consulted? Not Applicable   Shelda Pal, [place] 06/05/17 1902

## 2017-06-05 NOTE — ED Triage Notes (Signed)
Patient reports blood pressure running high last night and continued today.  Patient gave no clear reason for checking blood pressure, but reports blood pressure readings high.    206/87 180/97 177/82 183/93    Denies chest pain.  Patient does have a slight headache.    Patient states she started fearing being left alone this evening.

## 2017-06-05 NOTE — Discharge Instructions (Signed)
Take 2 tabs of your amlodipine (Norvasc) until you see your PCP. It may be a good idea to bring your blood pressure monitors to your appointment so you can verify they are accurate.   If you start having chest pain, shortness of breath, weakness, difficulty swallowing, trouble with speech, or vision changes, seek immediate care.

## 2017-06-19 ENCOUNTER — Encounter (INDEPENDENT_AMBULATORY_CARE_PROVIDER_SITE_OTHER): Payer: Self-pay | Admitting: Physician Assistant

## 2017-06-19 ENCOUNTER — Ambulatory Visit (INDEPENDENT_AMBULATORY_CARE_PROVIDER_SITE_OTHER): Payer: Medicare Other | Admitting: Physician Assistant

## 2017-06-19 DIAGNOSIS — M170 Bilateral primary osteoarthritis of knee: Secondary | ICD-10-CM

## 2017-06-19 NOTE — Progress Notes (Signed)
HPI: [name] returns today 2 months status post bilateral knee Monovisc injections.  She states both knees are doing well.  She is happy with the results.  She states she just has some stiffness.  No mechanical symptoms.  Review of systems: No catching locking giving way or painful popping of either knee.  Physical exam bilateral knees:Full extension full flexion.  Passive range of motion reveals patellofemoral crepitus.  No instability valgus varus stressing of either knee.  Calf supple nontender bilaterally.  No effusion abnormal warmth erythema of either knee.  Plan: She will follow-up on an as-needed basis for her knees.  I did discuss with her that she can have cortisone injections as of every 13months last  cortisone injections given in November.  Therefore she can have injections with cortisone at any time.  In regards to the Monovisc injection she knows to call at least a month in advance and be given again in July if she needs that.  She will follow-up on an as-needed basis.

## 2017-07-25 ENCOUNTER — Telehealth (HOSPITAL_COMMUNITY): Payer: Self-pay | Admitting: Emergency Medicine

## 2017-07-25 ENCOUNTER — Ambulatory Visit (HOSPITAL_COMMUNITY)
Admission: EM | Admit: 2017-07-25 | Discharge: 2017-07-25 | Disposition: A | Discharge status: Home / Self Care | Payer: Medicare Other | Attending: Family Medicine | Admitting: Family Medicine

## 2017-07-25 ENCOUNTER — Encounter (HOSPITAL_COMMUNITY): Payer: Self-pay | Admitting: Emergency Medicine

## 2017-07-25 DIAGNOSIS — Z96649 Presence of unspecified artificial hip joint: Secondary | ICD-10-CM | POA: Diagnosis not present

## 2017-07-25 DIAGNOSIS — Z79899 Other long term (current) drug therapy: Secondary | ICD-10-CM | POA: Insufficient documentation

## 2017-07-25 DIAGNOSIS — I10 Essential (primary) hypertension: Secondary | ICD-10-CM | POA: Insufficient documentation

## 2017-07-25 DIAGNOSIS — R55 Syncope and collapse: Secondary | ICD-10-CM | POA: Diagnosis not present

## 2017-07-25 DIAGNOSIS — Z90710 Acquired absence of both cervix and uterus: Secondary | ICD-10-CM | POA: Insufficient documentation

## 2017-07-25 DIAGNOSIS — Z8042 Family history of malignant neoplasm of prostate: Secondary | ICD-10-CM | POA: Diagnosis not present

## 2017-07-25 DIAGNOSIS — R197 Diarrhea, unspecified: Secondary | ICD-10-CM

## 2017-07-25 DIAGNOSIS — Z833 Family history of diabetes mellitus: Secondary | ICD-10-CM | POA: Diagnosis not present

## 2017-07-25 DIAGNOSIS — Z880 Allergy status to penicillin: Secondary | ICD-10-CM | POA: Insufficient documentation

## 2017-07-25 DIAGNOSIS — R109 Unspecified abdominal pain: Secondary | ICD-10-CM | POA: Insufficient documentation

## 2017-07-25 DIAGNOSIS — Z7982 Long term (current) use of aspirin: Secondary | ICD-10-CM | POA: Insufficient documentation

## 2017-07-25 DIAGNOSIS — M5136 Other intervertebral disc degeneration, lumbar region: Secondary | ICD-10-CM | POA: Insufficient documentation

## 2017-07-25 DIAGNOSIS — E559 Vitamin D deficiency, unspecified: Secondary | ICD-10-CM | POA: Insufficient documentation

## 2017-07-25 DIAGNOSIS — Z801 Family history of malignant neoplasm of trachea, bronchus and lung: Secondary | ICD-10-CM | POA: Diagnosis not present

## 2017-07-25 DIAGNOSIS — Z841 Family history of disorders of kidney and ureter: Secondary | ICD-10-CM | POA: Insufficient documentation

## 2017-07-25 DIAGNOSIS — Z9889 Other specified postprocedural states: Secondary | ICD-10-CM | POA: Insufficient documentation

## 2017-07-25 DIAGNOSIS — F17210 Nicotine dependence, cigarettes, uncomplicated: Secondary | ICD-10-CM | POA: Insufficient documentation

## 2017-07-25 DIAGNOSIS — G5600 Carpal tunnel syndrome, unspecified upper limb: Secondary | ICD-10-CM | POA: Insufficient documentation

## 2017-07-25 DIAGNOSIS — Z885 Allergy status to narcotic agent status: Secondary | ICD-10-CM | POA: Insufficient documentation

## 2017-07-25 MED ORDER — ONDANSETRON 4 MG PO TBDP: 4.0000 mg | ORAL_TABLET | Freq: Three times a day (TID) | ORAL | 0 refills | Status: DC | PRN

## 2017-07-25 NOTE — Discharge Instructions (Addendum)
Please use material provided to collect a stool sample, please double bag containers and return as soon as possible.  Please begin taking daily yogurt like activity or daily probiotic pills.  You may get these over-the-counter.  Please follow-up here or in emergency room if symptoms worsening, develop lightheadedness, dizziness, worsening abdominal pain.  Please follow-up with primary provider if symptoms persisting.  For nausea: Zofran prescribed. Begin with every 6 hours, than as you are able to hold food down, take it as needed. Start with clear liquids, then move to plain foods like bananas, rice, applesauce, toast, broth, grits, oatmeal. As those food settle okay you may transition to your normal foods. Avoid spicy and greasy foods as much as possible.  For Diarrhea: This is your body's natural way of getting rid of a virus. You may try taking 1 imodium to decrease amount of stools a day, but we do not want you to stop your diarrhea.   Preventing dehydration is key! You need to replace the fluid your body is expelling. Drink plenty of fluids, may use Pedialyte or sports drinks.   Please return if you are experiencing blood in your vomit or stool or experiencing dizziness, lightheadedness, extreme fatigue, increased abdominal pain.

## 2017-07-25 NOTE — ED Provider Notes (Signed)
[place]    CSN: [id number] Arrival date & time: 07/25/17  1002     History   Chief Complaint Chief Complaint  Patient presents with  . Abdominal Pain  . Diarrhea    HPI [name] is a 84 y.o. female presenting today with concern for diarrhea.  States that approximately 6 days ago she ate some chicken at a fast food restaurant and subsequently after she developed diarrhea.  Diarrhea has been persistent for the past 6 days.  She is going approximately 5 times a day.  Initially had vomiting, but vomiting only lasted 1-2 days.  She has still had persistent nausea.  Feels like anything she eats just goes right through her.  Denies any blood in her stool.  States that her belly feels sore, but denies any abdominal pain.  Denies history of diverticulosis.  HPI  Past Medical History:  Diagnosis Date  . Allergic rhinitis   . Arthritis   . Carpal tunnel syndrome   . Deep venous thrombosis (HCC)    bilateral legs  . Degenerative arthritis    right knee  . Hypertension   . Lumbar degenerative disc disease   . Lump or mass in breast   . Paresthesia   . Syncope 05/24/2013  . Vitamin D deficiency     Patient Active Problem List   Diagnosis Date Noted  . Syncope 05/24/2013    Past Surgical History:  Procedure Laterality Date  . ABDOMINAL HYSTERECTOMY    . CARPAL TUNNEL RELEASE Right   . CATARACT EXTRACTION Bilateral   . HEMORROIDECTOMY    . IVC Filter    . TOTAL HIP ARTHROPLASTY Right   . ULNAR NERVE TRANSPOSITION Right     OB History   None      Home Medications    Prior to Admission medications   Medication Sig Start Date End Date Taking? Authorizing Provider  albuterol (PROVENTIL HFA;VENTOLIN HFA) 108 (90 Base) MCG/ACT inhaler Inhale 2 puffs into the lungs every 6 (six) hours as needed for wheezing or shortness of breath. Patient not taking: Reported on 06/19/2017 10/03/15   Melynda Ripple, MD  amLODipine (NORVASC) 5 MG tablet Take 2 tablets  (10 mg total) by mouth daily. 06/05/17   Shelda Pal, DO  aspirin 81 MG tablet Take 81 mg by mouth daily.    [provider]  diclofenac sodium (VOLTAREN) 1 % GEL Apply 4 g topically 4 (four) times daily. Patient not taking: Reported on 06/19/2017 02/18/16   Pete Pelt, PA-C  ergocalciferol (VITAMIN D2) 50000 UNITS capsule Take 50,000 Units by mouth every Monday.     [provider]  gabapentin (NEURONTIN) 300 MG capsule Take 300 mg by mouth 2 (two) times daily.     [provider]  ipratropium (ATROVENT) 0.06 % nasal spray Place 2 sprays into both nostrils 4 (four) times daily. Patient not taking: Reported on 06/19/2017 06/12/15   Waldemar Dickens, MD  lisinopril-hydrochlorothiazide (PRINZIDE,ZESTORETIC) 20-12.5 MG per tablet Take 1 tablet by mouth daily.    [provider]  meclizine (ANTIVERT) 25 MG tablet Take 25 mg by mouth every 8 (eight) hours as needed for dizziness.  10/27/14   [provider]  Multiple Vitamin (MULTIVITAMIN WITH MINERALS) TABS tablet Take 1 tablet by mouth daily.    [provider]  ondansetron (ZOFRAN ODT) 4 MG disintegrating tablet Take 1 tablet (4 mg total) by mouth every 8 (eight) hours as needed for nausea or vomiting.  07/25/17   Jasiyah Paulding, Elesa Hacker, PA-C  Spacer/Aero-Holding Chambers (AEROCHAMBER PLUS) inhaler Use as instructed Patient not taking: Reported on 06/19/2017 10/03/15   Melynda Ripple, MD  traMADol (ULTRAM) 50 MG tablet Take 1 tablet (50 mg total) by mouth every 6 (six) hours as needed. Patient not taking: Reported on 06/19/2017 04/24/17   Pete Pelt, PA-C    Family History Family History  Problem Relation Age of Onset  . Diabetes Mother   . Kidney failure Father   . Cancer - Lung Brother   . Cancer - Prostate Brother   . Kidney failure Son     Social History Social History   Tobacco Use  . Smoking status: Current Every Day Smoker    Packs/day: 10.00    Years: 30.00    Pack  years: 300.00  . Smokeless tobacco: Never Used  Substance Use Topics  . Alcohol use: Yes    Comment: occasional  . Drug use: No     Allergies   Codeine and Penicillins   Review of Systems Review of Systems  Constitutional: Positive for fatigue. Negative for chills and fever.  HENT: Negative for congestion, ear pain, rhinorrhea, sinus pressure, sore throat and trouble swallowing.   Respiratory: Negative for cough, chest tightness and shortness of breath.   Cardiovascular: Negative for chest pain.  Gastrointestinal: Positive for abdominal pain, diarrhea and nausea. Negative for vomiting.  Musculoskeletal: Negative for myalgias.  Skin: Negative for rash.  Neurological: Positive for light-headedness. Negative for dizziness, weakness and headaches.     Physical Exam Triage Vital Signs ED Triage Vitals [07/25/17 1039]  Enc Vitals Group     BP (!) 158/106     Pulse Rate 78     Resp 14     Temp 97.8 F (36.6 C)     Temp src      SpO2 99 %     Weight      Height      Head Circumference      Peak Flow      Pain Score      Pain Loc      Pain Edu?      Excl. in [place]?    No data found.  Updated Vital Signs BP (!) 158/106   Pulse 78   Temp 97.8 F (36.6 C)   Resp 14   SpO2 99%   Visual Acuity Right Eye Distance:   Left Eye Distance:   Bilateral Distance:    Right Eye Near:   Left Eye Near:    Bilateral Near:     Physical Exam  Constitutional: She appears well-developed and well-nourished. No distress.  HENT:  Head: Normocephalic and atraumatic.  Mouth/Throat: Oropharynx is clear and moist.  Eyes: Conjunctivae are normal.  Neck: Neck supple.  Cardiovascular: Normal rate and regular rhythm.  No murmur heard. Pulmonary/Chest: Effort normal and breath sounds normal. No respiratory distress.  Abdominal: Soft. There is no tenderness.  Abdomen is soft, nondistended, mild tenderness to palpation of right and left lower quadrants.  Negative Rovsing, negative  rebound, negative McBurney's.  Musculoskeletal: She exhibits no edema.  Neurological: She is alert.  Skin: Skin is warm and dry.  Psychiatric: She has a normal mood and affect.  Nursing note and vitals reviewed.    UC Treatments / Results  Labs (all labs ordered are listed, but only abnormal results are displayed) Labs Reviewed - No data to display  EKG None Radiology No results found.  Procedures Procedures (including  critical care time)  Medications Ordered in UC Medications - No data to display   Initial Impression / Assessment and Plan / UC Course  I have reviewed the triage vital signs and the nursing notes.  Pertinent labs & imaging results that were available during my care of the patient were reviewed by me and considered in my medical decision making (see chart for details).     84 year old female with diarrhea for 6 days.  Will have patient obtain stool sample and return sooner for PCR/culture and C. difficile.  Will provide Zofran to use as needed for nausea and hopefully to increase appetite and oral intake.  Discussed importance of hydration and provided recommendations.  Also advised to eat daily yogurt or begin daily probiotic.  Vital signs stable, patient is not in no acute distress in room, no tachycardia.  Patient likely has mild dehydration.  Did not feel she needs IV fluids at this time.  Will recommend oral rehydration. Discussed strict return precautions. Patient verbalized understanding and is agreeable with plan.   Final Clinical Impressions(s) / UC Diagnoses   Final diagnoses:  Diarrhea, unspecified type    ED Discharge Orders        Ordered    Stool culture     07/25/17 1102    Clostridium Difficile by PCR([facility])     07/25/17 1102    Gastrointestinal Panel by PCR , Stool     07/25/17 1103    ondansetron (ZOFRAN ODT) 4 MG disintegrating tablet  Every 8 hours PRN     07/25/17 1103       Controlled Substance Prescriptions [place]  Controlled Substance Registry consulted? Not Applicable   Janith Lima, [place] 07/25/17 1115

## 2017-07-25 NOTE — Telephone Encounter (Signed)
Orders for stool

## 2017-07-25 NOTE — ED Triage Notes (Signed)
Pt states last week she went to a fast food place and ate some chicken and it made her sick. Pt states "I cant keep anything down, I feel dehydrated and nauseated and going to the bathroom 5 times a day"

## 2017-07-29 LAB — STOOL CULTURE: E coli, Shiga toxin Assay: NEGATIVE

## 2017-07-29 LAB — STOOL CULTURE REFLEX - RSASHR

## 2017-07-29 LAB — STOOL CULTURE REFLEX - CMPCXR

## 2017-10-12 ENCOUNTER — Telehealth (INDEPENDENT_AMBULATORY_CARE_PROVIDER_SITE_OTHER): Payer: Self-pay | Admitting: Physician Assistant

## 2017-10-12 NOTE — Telephone Encounter (Signed)
Noted  

## 2017-10-12 NOTE — Telephone Encounter (Signed)
Can we order this for her if needed

## 2017-10-12 NOTE — Telephone Encounter (Signed)
Patient called asked if she can get the Monovisc injection in July. The number to contact patient is [phone]

## 2017-10-17 ENCOUNTER — Telehealth (INDEPENDENT_AMBULATORY_CARE_PROVIDER_SITE_OTHER): Payer: Self-pay

## 2017-10-17 NOTE — Telephone Encounter (Signed)
Submitted application online for Monovisc injection, bilateral knee. 

## 2017-10-23 ENCOUNTER — Telehealth (INDEPENDENT_AMBULATORY_CARE_PROVIDER_SITE_OTHER): Payer: Self-pay | Admitting: Orthopaedic Surgery

## 2017-10-23 NOTE — Telephone Encounter (Signed)
Patient calling to find out if her injections have been ordered.  I informed that application was submitted on 10-17-17 and she will be called when they are available.

## 2017-10-23 NOTE — Telephone Encounter (Signed)
Talked with patient and advised her that we are waiting on a response from her insurance for approval for gel injection. Will call patient to advise once an approval has been received.

## 2017-10-23 NOTE — Telephone Encounter (Signed)
See below

## 2017-10-25 ENCOUNTER — Telehealth (INDEPENDENT_AMBULATORY_CARE_PROVIDER_SITE_OTHER): Payer: Self-pay

## 2017-10-25 NOTE — Telephone Encounter (Signed)
Talked with [place] and initiated PA. Pending PA# [id number]

## 2017-10-31 ENCOUNTER — Telehealth (INDEPENDENT_AMBULATORY_CARE_PROVIDER_SITE_OTHER): Payer: Self-pay | Admitting: Orthopaedic Surgery

## 2017-10-31 NOTE — Telephone Encounter (Signed)
Patient called asked if she can get a cortisone injection while she wait for the approval of the gel injection. Patient said she is in a lot of pain. The number to contact patient is [phone]

## 2017-10-31 NOTE — Telephone Encounter (Signed)
It is ok for her to have these cortisone injections now, can you please call her and schedule an appt for this?  Thanks.

## 2017-10-31 NOTE — Telephone Encounter (Signed)
Called patient left message to return call concerning appointment

## 2017-11-06 ENCOUNTER — Encounter (INDEPENDENT_AMBULATORY_CARE_PROVIDER_SITE_OTHER): Payer: Self-pay | Admitting: Orthopaedic Surgery

## 2017-11-06 ENCOUNTER — Telehealth (INDEPENDENT_AMBULATORY_CARE_PROVIDER_SITE_OTHER): Payer: Self-pay

## 2017-11-06 ENCOUNTER — Ambulatory Visit (INDEPENDENT_AMBULATORY_CARE_PROVIDER_SITE_OTHER): Payer: Medicare Other | Admitting: Orthopaedic Surgery

## 2017-11-06 DIAGNOSIS — M170 Bilateral primary osteoarthritis of knee: Secondary | ICD-10-CM | POA: Diagnosis not present

## 2017-11-06 MED ORDER — HYALURONAN 88 MG/4ML IX SOSY
88.0000 mg | PREFILLED_SYRINGE | INTRA_ARTICULAR | Status: AC | PRN
  Administered 2017-11-06: 88 mg via INTRA_ARTICULAR

## 2017-11-06 NOTE — Progress Notes (Signed)
   Procedure Note  Patient: [name]             Date of Birth: Sep 26, 1928           MRN: [id number]             Visit Date: 11/06/2017  Procedures: Visit Diagnoses: Bilateral primary osteoarthritis of knee  Large Joint Inj: bilateral knee on 11/06/2017 9:16 AM Indications: diagnostic evaluation and pain Details: 22 G 1.5 in needle, superolateral approach  Arthrogram: No  Medications (Right): 88 mg Hyaluronan 88 MG/4ML Medications (Left): 88 mg Hyaluronan 88 MG/4ML Outcome: tolerated well, no immediate complications Procedure, treatment alternatives, risks and benefits explained, specific risks discussed. Consent was given by the patient. Immediately prior to procedure a time out was called to verify the correct patient, procedure, equipment, support staff and site/side marked as required. Patient was prepped and draped in the usual sterile fashion.     The patient is here today for scheduled hyaluronic acid injections in both knees.  She has known osteoarthritis in the knees and has been told she should not have any type of surgery.  These injections do last her at least about 5 months.  They have done better for her to treat her arthritis pain then steroid injections.  She is otherwise doing well from a health standpoint.  He is active 84 year old.  She does not walk with assistive device.  On exam both knees have slight valgus malalignment with good range of motion.  Both knees are ligamentously stable.  We placed Monovisc in both knees without any difficulty.  All questions concerns were answered and addressed.  Follow-up will be as needed.

## 2017-11-06 NOTE — Telephone Encounter (Signed)
Faxed office notes to [facility] at [phone] for Monovisc injection.

## 2017-12-11 ENCOUNTER — Telehealth (INDEPENDENT_AMBULATORY_CARE_PROVIDER_SITE_OTHER): Payer: Self-pay

## 2017-12-11 NOTE — Telephone Encounter (Signed)
Talked with representative with [facility] concerning authorization for gel injection.  Patient was denied for authorization due to not tried and failed Synvisc/SynviscOne. Reference# [id number]

## 2017-12-17 ENCOUNTER — Encounter (HOSPITAL_COMMUNITY): Payer: Self-pay

## 2017-12-17 ENCOUNTER — Ambulatory Visit (INDEPENDENT_AMBULATORY_CARE_PROVIDER_SITE_OTHER)
Admission: EM | Admit: 2017-12-17 | Discharge: 2017-12-17 | Disposition: A | Discharge status: Home / Self Care | Payer: Medicare Other | Source: Home / Self Care | Attending: Family Medicine | Admitting: Family Medicine

## 2017-12-17 ENCOUNTER — Emergency Department (HOSPITAL_COMMUNITY)
Admission: EM | Admit: 2017-12-17 | Discharge: 2017-12-17 | Disposition: A | Discharge status: Home / Self Care | Payer: Medicare Other | Attending: Emergency Medicine | Admitting: Emergency Medicine

## 2017-12-17 ENCOUNTER — Emergency Department (HOSPITAL_COMMUNITY): Payer: Medicare Other

## 2017-12-17 ENCOUNTER — Encounter (HOSPITAL_COMMUNITY): Payer: Self-pay | Admitting: Emergency Medicine

## 2017-12-17 ENCOUNTER — Other Ambulatory Visit: Payer: Self-pay

## 2017-12-17 DIAGNOSIS — R51 Headache: Secondary | ICD-10-CM | POA: Diagnosis present

## 2017-12-17 DIAGNOSIS — Z96641 Presence of right artificial hip joint: Secondary | ICD-10-CM | POA: Diagnosis not present

## 2017-12-17 DIAGNOSIS — R519 Headache, unspecified: Secondary | ICD-10-CM

## 2017-12-17 DIAGNOSIS — F17210 Nicotine dependence, cigarettes, uncomplicated: Secondary | ICD-10-CM | POA: Diagnosis not present

## 2017-12-17 DIAGNOSIS — I10 Essential (primary) hypertension: Secondary | ICD-10-CM | POA: Diagnosis not present

## 2017-12-17 DIAGNOSIS — J323 Chronic sphenoidal sinusitis: Secondary | ICD-10-CM | POA: Diagnosis not present

## 2017-12-17 LAB — COMPREHENSIVE METABOLIC PANEL
ALK PHOS: 64 U/L (ref 38–126)
ALT: 10 U/L (ref 0–44)
AST: 14 U/L — AB (ref 15–41)
Albumin: 3.9 g/dL (ref 3.5–5.0)
Anion gap: 8 (ref 5–15)
BILIRUBIN TOTAL: 1 mg/dL (ref 0.3–1.2)
BUN: 14 mg/dL (ref 8–23)
CALCIUM: 9.6 mg/dL (ref 8.9–10.3)
CO2: 25 mmol/L (ref 22–32)
Chloride: 105 mmol/L (ref 98–111)
Creatinine, Ser: 1.39 mg/dL — ABNORMAL HIGH (ref 0.44–1.00)
GFR calc Af Amer: 38 mL/min — ABNORMAL LOW (ref >60)
GFR calc non Af Amer: 33 mL/min — ABNORMAL LOW (ref >60)
GLUCOSE: 99 mg/dL (ref 70–99)
Potassium: 4.2 mmol/L (ref 3.5–5.1)
SODIUM: 138 mmol/L (ref 135–145)
TOTAL PROTEIN: 6.8 g/dL (ref 6.5–8.1)

## 2017-12-17 LAB — CBC
HCT: 47.2 % — ABNORMAL HIGH (ref 36.0–46.0)
HEMOGLOBIN: 15 g/dL (ref 12.0–15.0)
MCH: 30.6 pg (ref 26.0–34.0)
MCHC: 31.8 g/dL (ref 30.0–36.0)
MCV: 96.3 fL (ref 78.0–100.0)
Platelets: 200 10*3/uL (ref 150–400)
RBC: 4.9 MIL/uL (ref 3.87–5.11)
RDW: 14.6 % (ref 11.5–15.5)
WBC: 5.8 10*3/uL (ref 4.0–10.5)

## 2017-12-17 LAB — DIFFERENTIAL
ABS IMMATURE GRANULOCYTES: 0 10*3/uL (ref 0.0–0.1)
Basophils Absolute: 0 10*3/uL (ref 0.0–0.1)
Basophils Relative: 1 %
Eosinophils Absolute: 0.1 10*3/uL (ref 0.0–0.7)
Eosinophils Relative: 2 %
Immature Granulocytes: 0 %
LYMPHS ABS: 2 10*3/uL (ref 0.7–4.0)
LYMPHS PCT: 35 %
Monocytes Absolute: 0.6 10*3/uL (ref 0.1–1.0)
Monocytes Relative: 10 %
NEUTROS ABS: 3 10*3/uL (ref 1.7–7.7)
NEUTROS PCT: 52 %

## 2017-12-17 LAB — SEDIMENTATION RATE: Sed Rate: 5 mm/hr (ref 0–22)

## 2017-12-17 IMAGING — CT CT ANGIO HEAD
4 of 13 series · 14 of 47 positions shown · IV contrast (iopamidol)
Comparison: Brain MRI [DATE]

CLINICAL DATA: Acute headache since last night

EXAM:
CT ANGIOGRAPHY HEAD
TECHNIQUE: Multidetector CT imaging of the head was performed using the
standard protocol during bolus administration of intravenous
contrast. Multiplanar CT image reconstructions and MIPs were
obtained to evaluate the vascular anatomy.
CONTRAST:  50mL [PA] IOPAMIDOL ([PA]) INJECTION 76%

[Series 6: head bone · axial · 0.41mm/px · z∈[-101,-13]mm · 4 of 74 slices shown]
[im 15/74  bone]
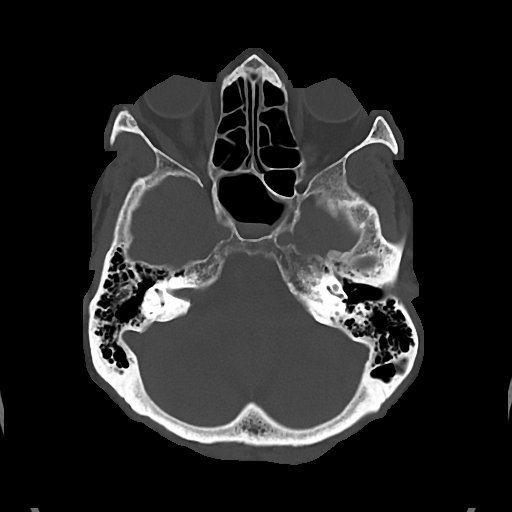
[im 30/74  bone]
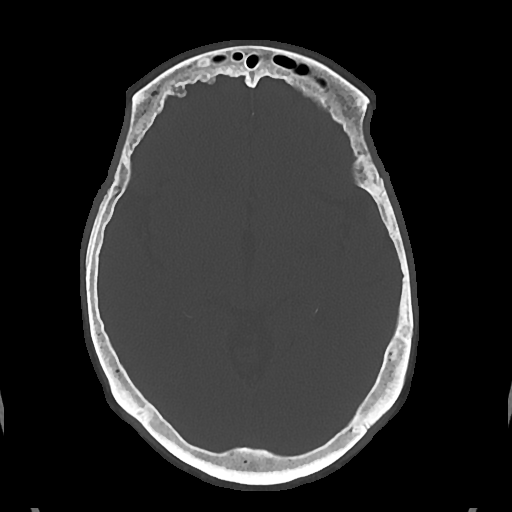
[im 44/74  bone]
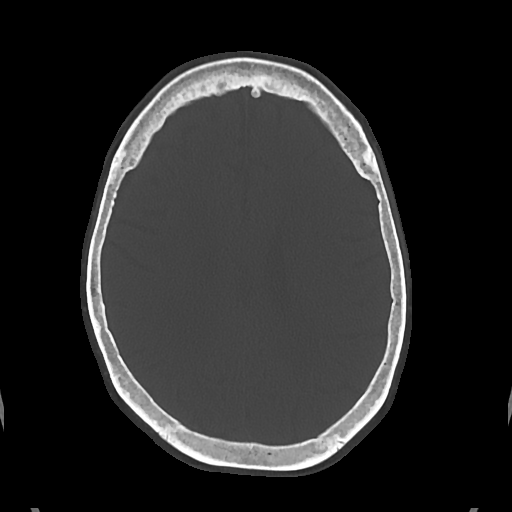
[im 59/74  bone]
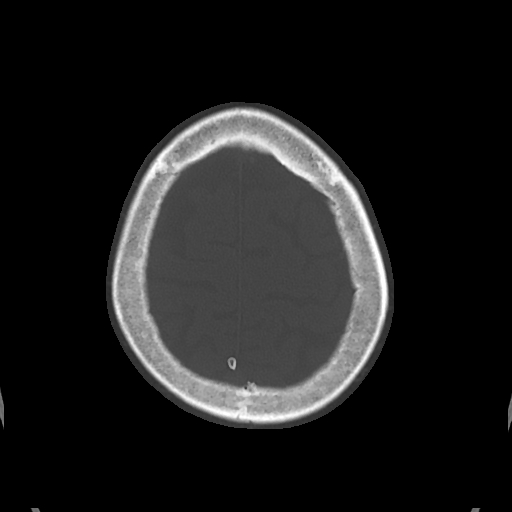

[Series 9: headangio 2.0 hr36 3 · axial · 0.41mm/px · z∈[-106,-16]mm · 4 of 77 slices shown]
[im 16/77  brain]
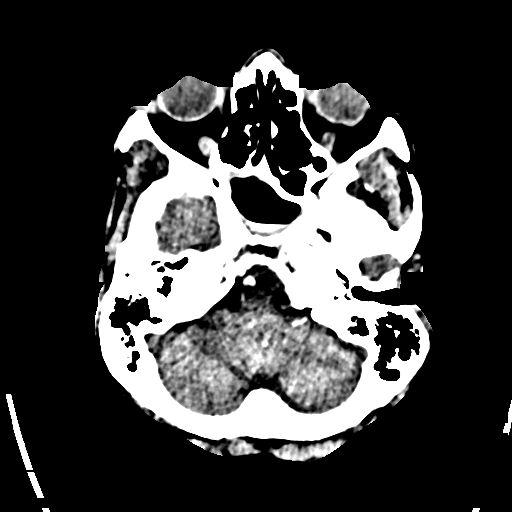
[im 31/77  bone]
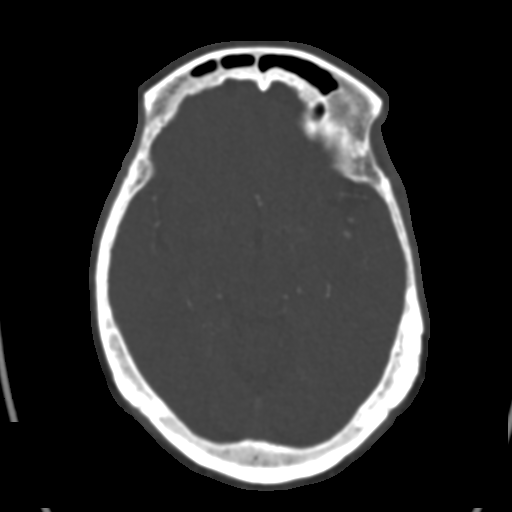
[im 46/77  brain]
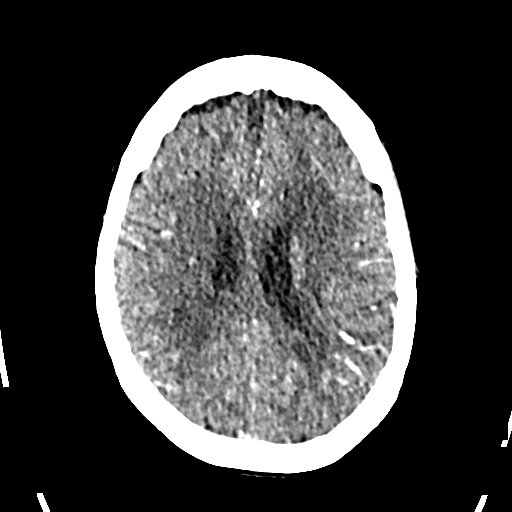
[im 61/77  bone]
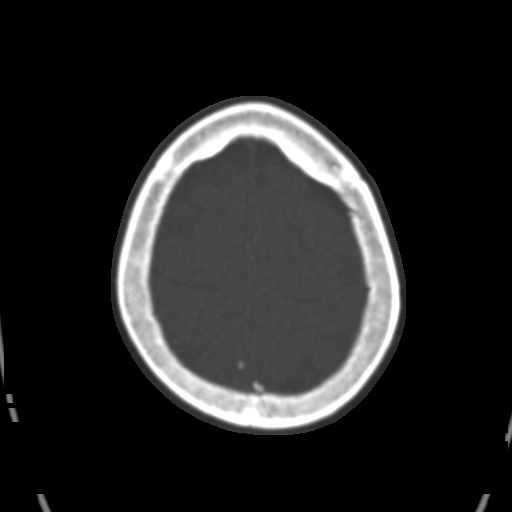

[Series 11: headangio 1.0 mpr cor · coronal · 0.33mm/px · 3 of 186 slices shown]
[im 62/186  brain]
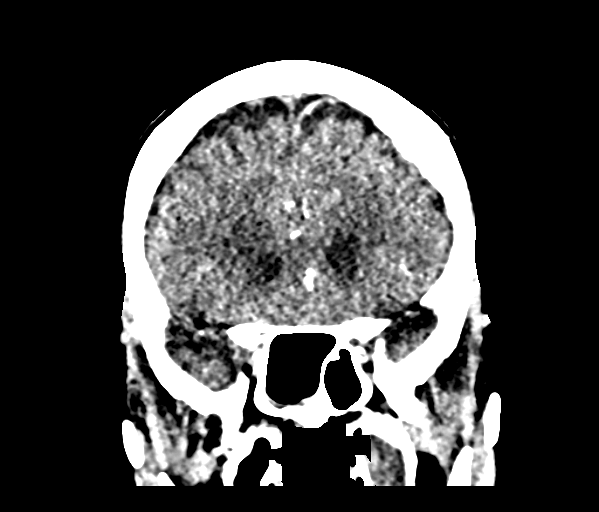
[im 93/186  brain]
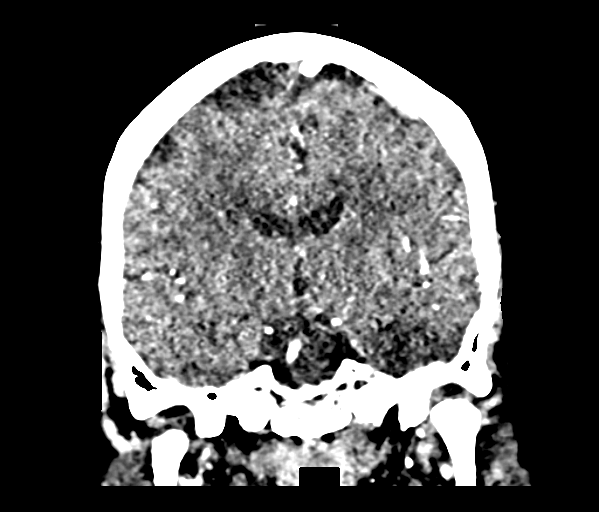
[im 124/186  brain]
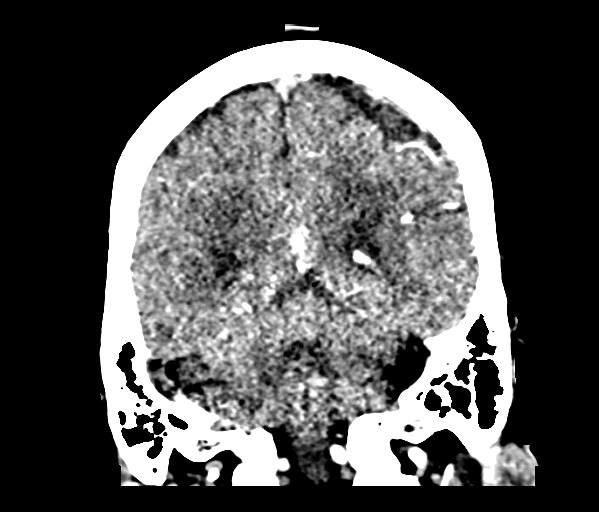

[Series 12: headangio 1.0 mpr sag · sagittal · 0.34mm/px · 3 of 145 slices shown]
[im 37/145  brain]
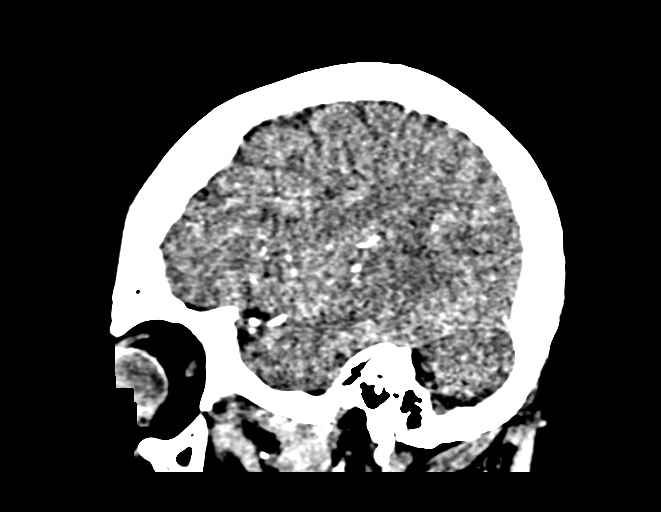
[im 73/145  brain]
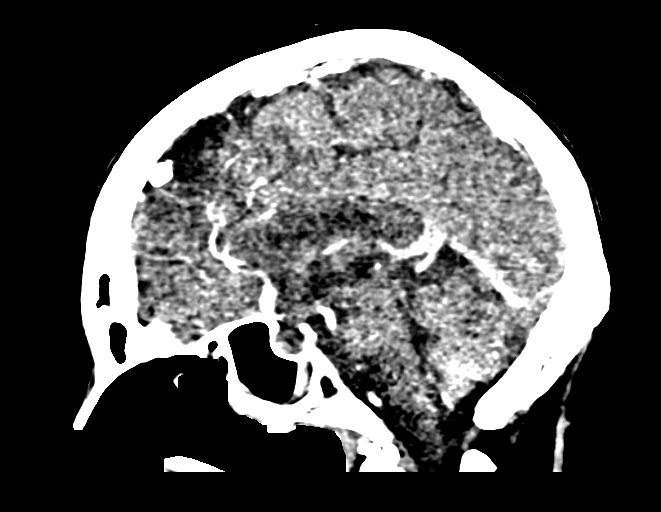
[im 109/145  brain]
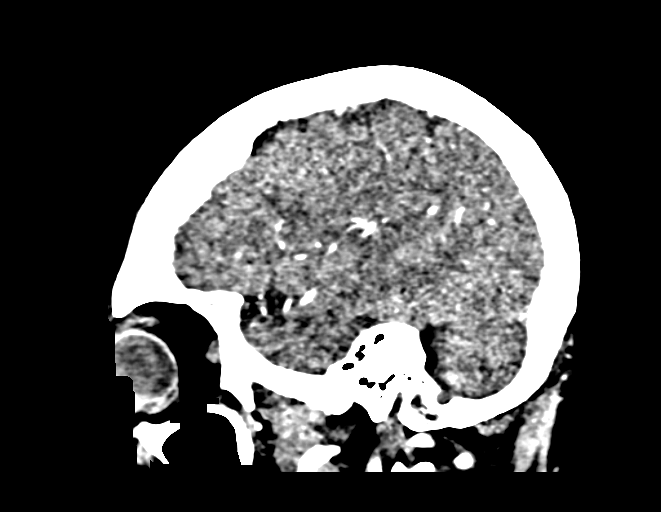

[14 of 47 positions shown; findings below may reference images not displayed]

FINDINGS: CT HEAD

Brain: No evidence of acute infarction, hemorrhage, hydrocephalus,
extra-axial collection or mass lesion/mass effect. Low-density in
the cerebral white matter consistent with moderate chronic small
vessel ischemia.

Vascular: See below

Skull: No acute finding

Sinuses: Right sphenoid sinusitis with mucosal thickening and
sclerotic wall thickening. The inflammation is new from [PA] but
there may be sclerotic wall thickening which would imply chronicity.
Small partially covered retention cysts in the maxillary sinuses.

Orbits: Bilateral cataract resection.

CTA HEAD

Anterior circulation: Atherosclerotic plaque on the carotid siphons.
No branch occlusion, beading, or aneurysm. There is mild
atherosclerotic irregularity and no flow limiting stenosis.

Posterior circulation: Left dominant vertebral artery. The vertebral
and basilar arteries are smooth and widely patent. No branch
occlusion, beading, or aneurysm.

Venous sinuses: Patent on the delayed phase.

Anatomic variants: None significant

Delayed phase: No abnormal intracranial enhancement.
IMPRESSION: 1. Senescent changes without acute intracranial finding. Negative
for aneurysm.
2. Right sphenoid sinusitis.

## 2017-12-17 MED ORDER — KETOROLAC TROMETHAMINE 30 MG/ML IJ SOLN
INTRAMUSCULAR | Status: AC
  Filled 2017-12-17: qty 1

## 2017-12-17 MED ORDER — DOXYCYCLINE HYCLATE 100 MG PO CAPS: 100.0000 mg | ORAL_CAPSULE | Freq: Two times a day (BID) | ORAL | 0 refills | Status: DC

## 2017-12-17 MED ORDER — KETOROLAC TROMETHAMINE 30 MG/ML IJ SOLN
30.0000 mg | Freq: Once | INTRAMUSCULAR | Status: AC
  Administered 2017-12-17: 30 mg via INTRAMUSCULAR

## 2017-12-17 MED ORDER — LISINOPRIL 10 MG PO TABS
10.0000 mg | ORAL_TABLET | Freq: Once | ORAL | Status: AC
  Administered 2017-12-17: 10 mg via ORAL
  Filled 2017-12-17: qty 1

## 2017-12-17 MED ORDER — IBUPROFEN 800 MG PO TABS: 800.0000 mg | ORAL_TABLET | Freq: Three times a day (TID) | ORAL | 0 refills | Status: DC

## 2017-12-17 MED ORDER — DIPHENHYDRAMINE HCL 50 MG/ML IJ SOLN
25.0000 mg | Freq: Once | INTRAMUSCULAR | Status: AC
  Administered 2017-12-17: 25 mg via INTRAVENOUS
  Filled 2017-12-17: qty 1

## 2017-12-17 MED ORDER — METOCLOPRAMIDE HCL 10 MG PO TABS: 10.0000 mg | ORAL_TABLET | Freq: Four times a day (QID) | ORAL | 0 refills | Status: DC | PRN

## 2017-12-17 MED ORDER — METOCLOPRAMIDE HCL 5 MG/ML IJ SOLN
10.0000 mg | Freq: Once | INTRAMUSCULAR | Status: AC
  Administered 2017-12-17: 10 mg via INTRAVENOUS
  Filled 2017-12-17: qty 2

## 2017-12-17 MED ORDER — DOXYCYCLINE HYCLATE 100 MG PO TABS
100.0000 mg | ORAL_TABLET | Freq: Once | ORAL | Status: AC
  Administered 2017-12-17: 100 mg via ORAL
  Filled 2017-12-17: qty 1

## 2017-12-17 MED ORDER — LACTATED RINGERS IV BOLUS
1000.0000 mL | Freq: Once | INTRAVENOUS | Status: AC
  Administered 2017-12-17: 1000 mL via INTRAVENOUS

## 2017-12-17 MED ORDER — IOPAMIDOL (ISOVUE-370) INJECTION 76%
INTRAVENOUS | Status: AC
  Administered 2017-12-17: 50 mL
  Filled 2017-12-17: qty 50

## 2017-12-17 MED ORDER — AMLODIPINE BESYLATE 5 MG PO TABS
5.0000 mg | ORAL_TABLET | Freq: Once | ORAL | Status: AC
  Administered 2017-12-17: 5 mg via ORAL
  Filled 2017-12-17: qty 1

## 2017-12-17 MED ORDER — IBUPROFEN 800 MG PO TABS
800.0000 mg | ORAL_TABLET | Freq: Once | ORAL | Status: AC
  Administered 2017-12-17: 800 mg via ORAL
  Filled 2017-12-17: qty 1

## 2017-12-17 MED ORDER — METHYLPREDNISOLONE SODIUM SUCC 125 MG IJ SOLR
125.0000 mg | Freq: Once | INTRAMUSCULAR | Status: AC
  Administered 2017-12-17: 125 mg via INTRAVENOUS
  Filled 2017-12-17: qty 2

## 2017-12-17 NOTE — Discharge Instructions (Addendum)
You need to go to the hospital for additional testing We will call to get this started I have given you a pain shot to help with the transition

## 2017-12-17 NOTE — ED Triage Notes (Signed)
Pt states she had a right sided headache start suddenly last night that shoots across to the left head. She has no neurological deficits, no weakness, no visual disturbances. Alert and oriented X 4.

## 2017-12-17 NOTE — ED Notes (Signed)
Lab called to add on sed rate

## 2017-12-17 NOTE — ED Notes (Signed)
ED Provider at bedside. 

## 2017-12-17 NOTE — ED Triage Notes (Signed)
Headache x 1 day . Throbbing and sharp right side of her head.

## 2017-12-17 NOTE — ED Notes (Signed)
Lab reports needing another lavender for sed rate. Sample to be collected upon returning from CT.

## 2017-12-17 NOTE — ED Notes (Signed)
Called patient x3 to be roomed and had no answer.

## 2017-12-17 NOTE — ED Provider Notes (Signed)
Emergency Department Provider Note   I have reviewed the triage vital signs and the nursing notes.   HISTORY  Chief Complaint Headache   HPI [name] is a 84 y.o. female with multiple medical problems as documented below the presents to the emergency department today from urgent care secondary to a headache.  Patient states she had a progressively worsening headache since last night.  It is dull in nature but she has intermittent episodes of a sharp shooting pain from the front of her head backwards around her temples to her posterior scalp area.  States she has never had a headache like this before.  States she does have any neck pain, vision changes, weakness, sensation changes or paresthesias.  No new medications.  No falls.  No recent fevers or other illnesses. No other associated or modifying symptoms.    Past Medical History:  Diagnosis Date  . Allergic rhinitis   . Arthritis   . Carpal tunnel syndrome   . Deep venous thrombosis (HCC)    bilateral legs  . Degenerative arthritis    right knee  . Hypertension   . Lumbar degenerative disc disease   . Lump or mass in breast   . Paresthesia   . Syncope 05/24/2013  . Vitamin D deficiency     Patient Active Problem List   Diagnosis Date Noted  . Syncope 05/24/2013    Past Surgical History:  Procedure Laterality Date  . ABDOMINAL HYSTERECTOMY    . CARPAL TUNNEL RELEASE Right   . CATARACT EXTRACTION Bilateral   . HEMORROIDECTOMY    . IVC Filter    . TOTAL HIP ARTHROPLASTY Right   . ULNAR NERVE TRANSPOSITION Right     Current Outpatient Rx  . Order #: [id number] Class: Historical Med  . Order #: [id number] Class: Historical Med  . Order #: [id number] Class: Historical Med  . Order #: [id number] Class: Historical Med  . Order #: [id number] Class: Historical Med  . Order #: [id number] Class: Historical Med  . Order #: [id number] Class: Historical Med  . Order #: [id number] Class: Historical Med  . Order #: [id number] Class:  Historical Med  . Order #: [id number] Class: Historical Med  . Order #: [id number] Class: Normal  . Order #: [id number] Class: Normal  . Order #: [id number] Class: Normal    Allergies Codeine and Penicillins  Family History  Problem Relation Age of Onset  . Diabetes Mother   . Kidney failure Father   . Cancer - Lung Brother   . Cancer - Prostate Brother   . Kidney failure Son     Social History Social History   Tobacco Use  . Smoking status: Current Every Day Smoker    Packs/day: 10.00    Years: 30.00    Pack years: 300.00  . Smokeless tobacco: Never Used  Substance Use Topics  . Alcohol use: Yes    Comment: occasional  . Drug use: No    Review of Systems  All other systems negative except as documented in the HPI. All pertinent positives and negatives as reviewed in the HPI. ____________________________________________   PHYSICAL EXAM:  VITAL SIGNS: ED Triage Vitals  Enc Vitals Group     BP 12/17/17 1614 (!) 198/84     Pulse Rate 12/17/17 1614 68     Resp 12/17/17 1614 18     Temp 12/17/17 1614 98.4 F (36.9 C)     Temp src --      SpO2 12/17/17 1614 100 %     Weight --  Constitutional: Alert and oriented. Well appearing and in no acute distress. Eyes: Conjunctivae are normal. PERRL. EOMI. Head: Atraumatic. No ttp over temporal artery Nose: No congestion/rhinnorhea. Mouth/Throat: Mucous membranes are moist.  Oropharynx non-erythematous. Neck: No stridor.  No meningeal signs.   Cardiovascular: Normal rate, regular rhythm. Good peripheral circulation. Grossly normal heart sounds.   Respiratory: Normal respiratory effort.  No retractions. Lungs CTAB. Gastrointestinal: Soft and nontender. No distention.  Musculoskeletal: No lower extremity tenderness nor edema. No gross deformities of extremities. Neurologic:  Normal speech and language. No gross focal neurologic deficits are appreciated. No altered mental status, able to give full seemingly accurate history.    Face is symmetric, EOM's intact, pupils equal and reactive, vision intact, tongue and uvula midline without deviation. Upper and Lower extremity motor 5/5, intact pain perception in distal extremities, 2+ reflexes in biceps, patella and achilles tendons. Able to perform finger to nose normal with both hands. Walks without assistance or evident ataxia.  Skin:  Skin is warm, dry and intact. No rash noted.  ____________________________________________   LABS (all labs ordered are listed, but only abnormal results are displayed)  Labs Reviewed  CBC - Abnormal; Notable for the following components:      Result Value   HCT 47.2 (*)    All other components within normal limits  COMPREHENSIVE METABOLIC PANEL - Abnormal; Notable for the following components:   Creatinine, Ser 1.39 (*)    AST 14 (*)    GFR calc non Af Amer 33 (*)    GFR calc Af Amer 38 (*)    All other components within normal limits  DIFFERENTIAL  SEDIMENTATION RATE   ____________________________________________  RADIOLOGY  No results found.  ____________________________________________   PROCEDURES  Procedure(s) performed:   Procedures   ____________________________________________   INITIAL IMPRESSION / ASSESSMENT AND PLAN / ED COURSE  Meds. Cta.   cta ok. HA improved. Has sinusitis on CT scan, will treat for same.      Pertinent labs & imaging results that were available during my care of the patient were reviewed by me and considered in my medical decision making (see chart for details).  ____________________________________________  FINAL CLINICAL IMPRESSION(S) / ED DIAGNOSES  Final diagnoses:  Sphenoid sinusitis, unspecified chronicity  Nonintractable headache, unspecified chronicity pattern, unspecified headache type     MEDICATIONS GIVEN DURING THIS VISIT:  Medications  metoCLOPramide (REGLAN) injection 10 mg (10 mg Intravenous Given 12/17/17 1737)  diphenhydrAMINE (BENADRYL)  injection 25 mg (25 mg Intravenous Given 12/17/17 1735)  methylPREDNISolone sodium succinate (SOLU-MEDROL) 125 mg/2 mL injection 125 mg (125 mg Intravenous Given 12/17/17 1739)  lactated ringers bolus 1,000 mL (0 mLs Intravenous Stopped 12/17/17 2032)  lisinopril (PRINIVIL,ZESTRIL) tablet 10 mg (10 mg Oral Given 12/17/17 2033)  amLODipine (NORVASC) tablet 5 mg (5 mg Oral Given 12/17/17 2033)  iopamidol (ISOVUE-370) 76 % injection (50 mLs  Contrast Given 12/17/17 1745)  ibuprofen (ADVIL,MOTRIN) tablet 800 mg (800 mg Oral Given 12/17/17 2046)  doxycycline (VIBRA-TABS) tablet 100 mg (100 mg Oral Given 12/17/17 2046)     NEW OUTPATIENT MEDICATIONS STARTED DURING THIS VISIT:  Discharge Medication List as of 12/17/2017  8:46 PM    START taking these medications   Details  doxycycline (VIBRAMYCIN) 100 MG capsule Take 1 capsule (100 mg total) by mouth 2 (two) times daily. One po bid x 7 days, Starting Sun 12/17/2017, Normal    metoCLOPramide (REGLAN) 10 MG tablet Take 1 tablet (10 mg total) by mouth every 6 (six) hours  as needed for nausea (nausea/headache)., Starting Sun 12/17/2017, Normal        Note:  This note was prepared with assistance of Dragon voice recognition software. Occasional wrong-word or sound-a-like substitutions may have occurred due to the inherent limitations of voice recognition software.   Merrily Pew, MD 12/20/17 [phone]

## 2017-12-17 NOTE — ED Notes (Signed)
Patient transported to CT 

## 2017-12-17 NOTE — ED Provider Notes (Signed)
[facility]    CSN: [id number] Arrival date & time: 12/17/17  1350     History   Chief Complaint Chief Complaint  Patient presents with  . Headache    HPI [name] is a 84 y.o. female.   HPI Patient presents to the urgent care center for evaluation of headache.  She states she does not usually have headaches.  She states she had a mild right-sided headache last night.  She slept well last night.  She woke up this morning with a headache.  She states that it has been shooting pains from her right eye through to the back of her head all day long.  She states that they are becoming more severe, and more frequent.  The pain itself is fleeting lasting only a few seconds.  No trouble with vision.  No tearing of the eye.  No sinus pain or infection, no congestion.  No recent injury.  No history of migraines.  She states that her blood pressure is generally well controlled.  She lives with her daughter.  No change in thinking or cognition.  No numbness or weakness in arms or legs. Past Medical History:  Diagnosis Date  . Allergic rhinitis   . Arthritis   . Carpal tunnel syndrome   . Deep venous thrombosis (HCC)    bilateral legs  . Degenerative arthritis    right knee  . Hypertension   . Lumbar degenerative disc disease   . Lump or mass in breast   . Paresthesia   . Syncope 05/24/2013  . Vitamin D deficiency     Patient Active Problem List   Diagnosis Date Noted  . Syncope 05/24/2013    Past Surgical History:  Procedure Laterality Date  . ABDOMINAL HYSTERECTOMY    . CARPAL TUNNEL RELEASE Right   . CATARACT EXTRACTION Bilateral   . HEMORROIDECTOMY    . IVC Filter    . TOTAL HIP ARTHROPLASTY Right   . ULNAR NERVE TRANSPOSITION Right     OB History   None      Home Medications    Prior to Admission medications   Medication Sig Start Date End Date Taking? Authorizing Provider  acetaminophen (TYLENOL) 325 MG tablet Take 650 mg by mouth every 6 (six)  hours as needed (for pain).    [provider]  albuterol (PROAIR HFA) 108 (90 Base) MCG/ACT inhaler Inhale 1-2 puffs into the lungs every 6 (six) hours as needed for wheezing or shortness of breath.    [provider]  amLODipine (NORVASC) 5 MG tablet Take 5 mg by mouth daily.  06/05/17   Shelda Pal, DO  aspirin 81 MG tablet Take 81 mg by mouth daily.    [provider]  diclofenac sodium (VOLTAREN) 1 % GEL Apply 2-4 g topically 4 (four) times daily as needed (for knee pain).    [provider]  ergocalciferol (VITAMIN D2) 50000 UNITS capsule Take 50,000 Units by mouth 2 (two) times a week.     [provider]  Fluocinolone Acetonide 0.01 % OIL Apply 1 application topically 2 (two) times daily as needed (for irritated areas of eyebrows). eyebrows 10/04/17   [provider]  gabapentin (NEURONTIN) 300 MG capsule Take 300 mg by mouth daily.     [provider]  ibuprofen (ADVIL,MOTRIN) 200 MG tablet Take 400 mg by mouth every 6 (six) hours as needed (for pain).    [provider]  lisinopril-hydrochlorothiazide ([place]) 20-12.5  MG per tablet Take 1 tablet by mouth daily.    [provider]  Multiple Vitamin (MULTIVITAMIN WITH MINERALS) TABS tablet Take 1 tablet by mouth 2 (two) times a week.     [provider]  ondansetron (ZOFRAN ODT) 4 MG disintegrating tablet Take 1 tablet (4 mg total) by mouth every 8 (eight) hours as needed for nausea or vomiting. Patient not taking: Reported on 12/17/2017 07/25/17   Janith Lima, PA-C    Family History Family History  Problem Relation Age of Onset  . Diabetes Mother   . Kidney failure Father   . Cancer - Lung Brother   . Cancer - Prostate Brother   . Kidney failure Son     Social History Social History   Tobacco Use  . Smoking status: Current Every Day Smoker    Packs/day: 10.00    Years: 30.00    Pack years: 300.00  . Smokeless  tobacco: Never Used  Substance Use Topics  . Alcohol use: Yes    Comment: occasional  . Drug use: No     Allergies   Codeine and Penicillins   Review of Systems Review of Systems  Constitutional: Negative for chills and fever.  HENT: Negative for ear pain and sore throat.   Eyes: Negative for pain and visual disturbance.  Respiratory: Negative for cough and shortness of breath.   Cardiovascular: Negative for chest pain and palpitations.  Gastrointestinal: Negative for abdominal pain and vomiting.  Genitourinary: Negative for dysuria and hematuria.  Musculoskeletal: Negative for arthralgias and back pain.  Skin: Negative for color change and rash.  Neurological: Positive for headaches. Negative for seizures and syncope.  All other systems reviewed and are negative.    Physical Exam Triage Vital Signs ED Triage Vitals  Enc Vitals Group     BP 12/17/17 1412 (!) 157/72     Pulse Rate 12/17/17 1412 73     Resp 12/17/17 1412 18     Temp 12/17/17 1412 98.8 F (37.1 C)     Temp src --      SpO2 12/17/17 1412 100 %     Weight 12/17/17 1411 220 lb (99.8 kg)     Height --      Head Circumference --      Peak Flow --      Pain Score 12/17/17 1412 10     Pain Loc --      Pain Edu? --      Excl. in [place]? --    No data found.  Updated Vital Signs BP (!) 157/72   Pulse 73   Temp 98.8 F (37.1 C)   Resp 18   Wt 99.8 kg   SpO2 100%   BMI 32.02 kg/m   Visual Acuity Right Eye Distance:   Left Eye Distance:   Bilateral Distance:    Right Eye Near:   Left Eye Near:    Bilateral Near:     Physical Exam  Constitutional: She is oriented to person, place, and time. She appears well-developed and well-nourished. She appears ill. No distress.  In obvious pain when headache occurs  HENT:  Head: Normocephalic and atraumatic.  Mouth/Throat: Oropharynx is clear and moist.  Eyes: Pupils are equal, round, and reactive to light. Conjunctivae and EOM are normal. Right eye  exhibits normal extraocular motion. Left eye exhibits normal extraocular motion.  Neck: Normal range of motion. Neck supple.  No adenopathy.  No bruit  Cardiovascular: Normal rate, regular rhythm and  normal heart sounds.  Pulmonary/Chest: Effort normal and breath sounds normal. No respiratory distress.  Abdominal: Soft. Bowel sounds are normal. She exhibits no distension.  Musculoskeletal: Normal range of motion. She exhibits no edema.  Neurological: She is alert and oriented to person, place, and time. She has normal strength. She displays normal reflexes. Coordination and gait normal.  Skin: Skin is warm and dry.  Psychiatric: She has a normal mood and affect. Her behavior is normal.     UC Treatments / Results  Labs (all labs ordered are listed, but only abnormal results are displayed) Labs Reviewed - No data to display  EKG None  Radiology Ct Angio Head W Or Wo Contrast  Result Date: 12/17/2017 CLINICAL DATA:  Acute headache since last night EXAM: CT ANGIOGRAPHY HEAD TECHNIQUE: Multidetector CT imaging of the head was performed using the standard protocol during bolus administration of intravenous contrast. Multiplanar CT image reconstructions and MIPs were obtained to evaluate the vascular anatomy. CONTRAST:  45mL ISOVUE-370 IOPAMIDOL (ISOVUE-370) INJECTION 76% COMPARISON:  Brain MRI 06/03/2013 FINDINGS: CT HEAD Brain: No evidence of acute infarction, hemorrhage, hydrocephalus, extra-axial collection or mass lesion/mass effect. Low-density in the cerebral white matter consistent with moderate chronic small vessel ischemia. Vascular: See below Skull: No acute finding Sinuses: Right sphenoid sinusitis with mucosal thickening and sclerotic wall thickening. The inflammation is new from 2015 but there may be sclerotic wall thickening which would imply chronicity. Small partially covered retention cysts in the maxillary sinuses. Orbits: Bilateral cataract resection. CTA HEAD Anterior  circulation: Atherosclerotic plaque on the carotid siphons. No branch occlusion, beading, or aneurysm. There is mild atherosclerotic irregularity and no flow limiting stenosis. Posterior circulation: Left dominant vertebral artery. The vertebral and basilar arteries are smooth and widely patent. No branch occlusion, beading, or aneurysm. Venous sinuses: Patent on the delayed phase. Anatomic variants: None significant Delayed phase: No abnormal intracranial enhancement. IMPRESSION: 1. Senescent changes without acute intracranial finding. Negative for aneurysm. 2. Right sphenoid sinusitis. Electronically Signed   By: Monte Fantasia M.D.   On: 12/17/2017 18:38    Procedures Procedures (including critical care time)  Medications Ordered in UC Medications  ketorolac (TORADOL) 30 MG/ML injection 30 mg (30 mg Intramuscular Given 12/17/17 1553)    Initial Impression / Assessment and Plan / UC Course  I have reviewed the triage vital signs and the nursing notes.  Pertinent labs & imaging results that were available during my care of the patient were reviewed by me and considered in my medical decision making (see chart for details).     I explained to the headache that a new onset of head pain, especially the worst pain ever experienced, in a patient her age required additional imaging that cannot be provided at the urgent care center.  She is therefore being sent to the emergency room for additional testing. Final Clinical Impressions(s) / UC Diagnoses   Final diagnoses:  Bad headache     Discharge Instructions     You need to go to the hospital for additional testing We will call to get this started I have given you a pain shot to help with the transition    ED Prescriptions    None     Controlled Substance Prescriptions [place] Controlled Substance Registry consulted? Not Applicable   Raylene Everts, MD 12/17/17 [facility]

## 2017-12-17 NOTE — ED Notes (Signed)
Additional sed rate sample sent

## 2018-05-03 ENCOUNTER — Telehealth (INDEPENDENT_AMBULATORY_CARE_PROVIDER_SITE_OTHER): Payer: Self-pay

## 2018-05-03 NOTE — Telephone Encounter (Signed)
Patient would like to get approved for Bil knee Gel injection-Dr Blackman/Gil.

## 2018-05-03 NOTE — Telephone Encounter (Signed)
See below

## 2018-05-03 NOTE — Telephone Encounter (Signed)
Patient called and would like to get the gel injection (sent msg to April already). She can get Gel injection anytime after Jan 22 and she is aware.  Would like specifically to speak to [place]. I advised her the assistant would call her and she said she wants to speak directly to [place].    CB [phone]

## 2018-05-09 NOTE — Telephone Encounter (Signed)
Noted  

## 2018-05-10 ENCOUNTER — Telehealth (INDEPENDENT_AMBULATORY_CARE_PROVIDER_SITE_OTHER): Payer: Self-pay

## 2018-05-10 NOTE — Telephone Encounter (Signed)
Submitted VOB for SynviscOne, bilateral knee. 

## 2018-05-15 ENCOUNTER — Telehealth (INDEPENDENT_AMBULATORY_CARE_PROVIDER_SITE_OTHER): Payer: Self-pay | Admitting: Orthopaedic Surgery

## 2018-05-15 NOTE — Telephone Encounter (Signed)
Patient called asked if she can get the cortisone injection while she wait for the gel injection to be approved. The number to contact patient is [phone]

## 2018-05-15 NOTE — Telephone Encounter (Signed)
This shouldn't be too much longer right?

## 2018-05-17 ENCOUNTER — Telehealth (INDEPENDENT_AMBULATORY_CARE_PROVIDER_SITE_OTHER): Payer: Self-pay

## 2018-05-17 NOTE — Telephone Encounter (Signed)
Talked with patient and advised her that she is approved for gel injection.  Approved for SynviscOne, bilateral knee. [place] Patient will be responsible for 20% OOP. No Co-pay No PA required  Appt. 05/21/2018 with Benita Stabile

## 2018-05-17 NOTE — Telephone Encounter (Signed)
Talked with patient concerning gel injection.  Appointment scheduled for 05/21/2018.

## 2018-05-21 ENCOUNTER — Encounter (INDEPENDENT_AMBULATORY_CARE_PROVIDER_SITE_OTHER): Payer: Self-pay | Admitting: Physician Assistant

## 2018-05-21 ENCOUNTER — Ambulatory Visit (INDEPENDENT_AMBULATORY_CARE_PROVIDER_SITE_OTHER): Payer: Medicare Other | Admitting: Physician Assistant

## 2018-05-21 DIAGNOSIS — M170 Bilateral primary osteoarthritis of knee: Secondary | ICD-10-CM | POA: Diagnosis not present

## 2018-05-21 MED ORDER — HYLAN G-F 20 48 MG/6ML IX SOSY
48.0000 mg | PREFILLED_SYRINGE | INTRA_ARTICULAR | Status: AC | PRN
  Administered 2018-05-21: 48 mg via INTRA_ARTICULAR

## 2018-05-21 MED ORDER — LIDOCAINE HCL 1 % IJ SOLN
0.5000 mL | INTRAMUSCULAR | Status: AC | PRN
  Administered 2018-05-21: .5 mL

## 2018-05-21 NOTE — Progress Notes (Signed)
   Procedure Note  Patient: [name]             Date of Birth: 05-28-1928           MRN: [id number]             Visit Date: 05/21/2018  HPI: Mrs. [name] comes in today for Synvisc 1 injections both knees.  She is been having increasing pain in both knees.  She states that she is fallen due to the knees giving way.  She has been using a cane to help her with her gait and balance.  Bilateral knees: No effusion abnormal warmth erythema.  Good range of motion both knees.  Ambulates without any type of assistive device. Procedures: Visit Diagnoses: Bilateral primary osteoarthritis of knee  Large Joint Inj: bilateral knee on 05/21/2018 10:17 AM Indications: pain Details: 18 G needle, anterolateral approach Medications (Right): 0.5 mL lidocaine 1 %; 48 mg Hylan 48 MG/6ML Medications (Left): 0.5 mL lidocaine 1 %; 48 mg Hylan 48 MG/6ML Outcome: tolerated well, no immediate complications Consent was given by the patient. Patient was prepped and draped in the usual sterile fashion.      Plan: She will call [name] to schedule her supplemental injections. She understands she can have cortisone injections in the knees every 3 months.

## 2018-09-09 ENCOUNTER — Emergency Department (HOSPITAL_COMMUNITY): Payer: Medicare Other

## 2018-09-09 ENCOUNTER — Encounter (HOSPITAL_COMMUNITY): Payer: Self-pay

## 2018-09-09 ENCOUNTER — Other Ambulatory Visit: Payer: Self-pay

## 2018-09-09 ENCOUNTER — Emergency Department (HOSPITAL_COMMUNITY)
Admission: EM | Admit: 2018-09-09 | Discharge: 2018-09-09 | Disposition: A | Discharge status: Home / Self Care | Payer: Medicare Other | Attending: Emergency Medicine | Admitting: Emergency Medicine

## 2018-09-09 DIAGNOSIS — W19XXXA Unspecified fall, initial encounter: Secondary | ICD-10-CM

## 2018-09-09 DIAGNOSIS — Z79899 Other long term (current) drug therapy: Secondary | ICD-10-CM | POA: Diagnosis not present

## 2018-09-09 DIAGNOSIS — F17200 Nicotine dependence, unspecified, uncomplicated: Secondary | ICD-10-CM | POA: Insufficient documentation

## 2018-09-09 DIAGNOSIS — W1789XA Other fall from one level to another, initial encounter: Secondary | ICD-10-CM | POA: Diagnosis not present

## 2018-09-09 DIAGNOSIS — Z7982 Long term (current) use of aspirin: Secondary | ICD-10-CM | POA: Diagnosis not present

## 2018-09-09 DIAGNOSIS — S60221A Contusion of right hand, initial encounter: Secondary | ICD-10-CM | POA: Diagnosis not present

## 2018-09-09 DIAGNOSIS — I10 Essential (primary) hypertension: Secondary | ICD-10-CM | POA: Insufficient documentation

## 2018-09-09 DIAGNOSIS — Z23 Encounter for immunization: Secondary | ICD-10-CM | POA: Insufficient documentation

## 2018-09-09 DIAGNOSIS — Y939 Activity, unspecified: Secondary | ICD-10-CM | POA: Insufficient documentation

## 2018-09-09 DIAGNOSIS — S01511A Laceration without foreign body of lip, initial encounter: Secondary | ICD-10-CM

## 2018-09-09 DIAGNOSIS — Y999 Unspecified external cause status: Secondary | ICD-10-CM | POA: Diagnosis not present

## 2018-09-09 DIAGNOSIS — Y929 Unspecified place or not applicable: Secondary | ICD-10-CM | POA: Insufficient documentation

## 2018-09-09 DIAGNOSIS — S8001XA Contusion of right knee, initial encounter: Secondary | ICD-10-CM

## 2018-09-09 DIAGNOSIS — Z96641 Presence of right artificial hip joint: Secondary | ICD-10-CM | POA: Insufficient documentation

## 2018-09-09 DIAGNOSIS — S0990XA Unspecified injury of head, initial encounter: Secondary | ICD-10-CM | POA: Diagnosis present

## 2018-09-09 IMAGING — CT CT CERVICAL SPINE WITHOUT CONTRAST
4 of 7 series · 16 of 33 positions shown, 17 images · non-contrast
Comparison: Head CT dated [DATE].

CLINICAL DATA: Fall today, laceration to lip, head trauma.

EXAM:
CT HEAD WITHOUT CONTRAST
CT CERVICAL SPINE WITHOUT CONTRAST
TECHNIQUE: Multidetector CT imaging of the head and cervical spine was
performed following the standard protocol without intravenous
contrast. Multiplanar CT image reconstructions of the cervical spine
were also generated.

[Series 5: coronal soft tissue · coronal · 0.28mm/px · 3 of 61 slices shown]
[im 16/61  bone]
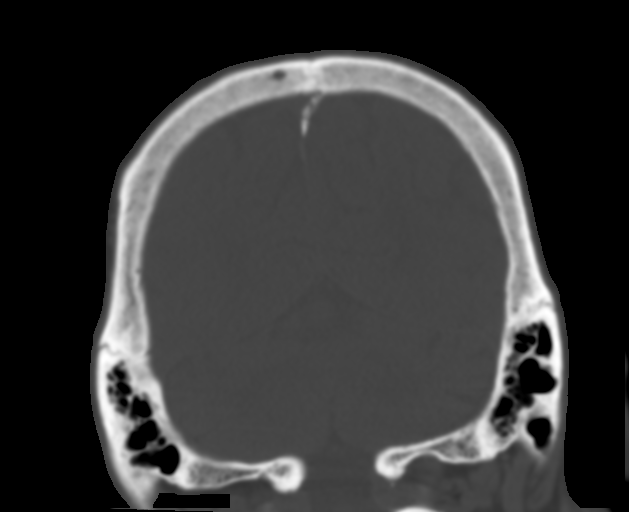
[im 31/61  bone]
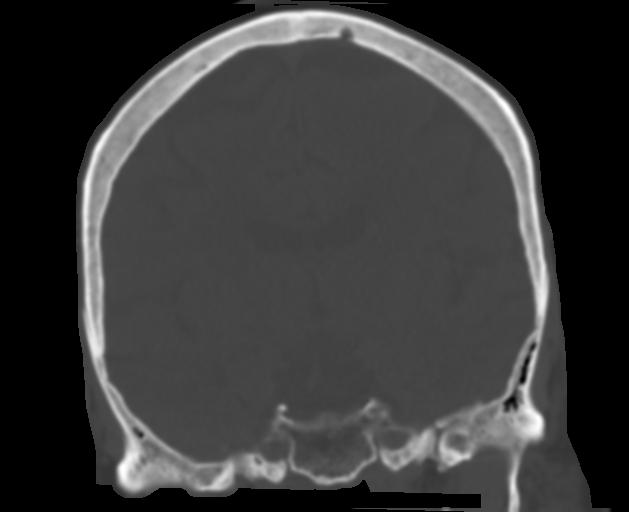
[im 46/61  bone]
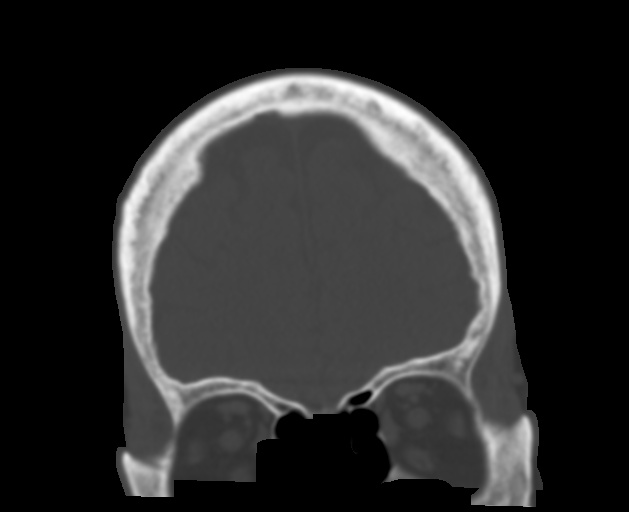

[Series 10: c spine soft · axial · 0.29mm/px · z∈[-234,-134]mm · 4 of 84 slices shown]
[im 17/84  soft-tissue]
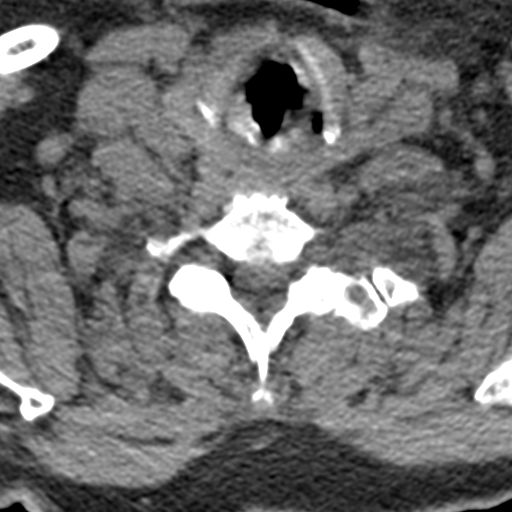
[im 34/84  soft-tissue]
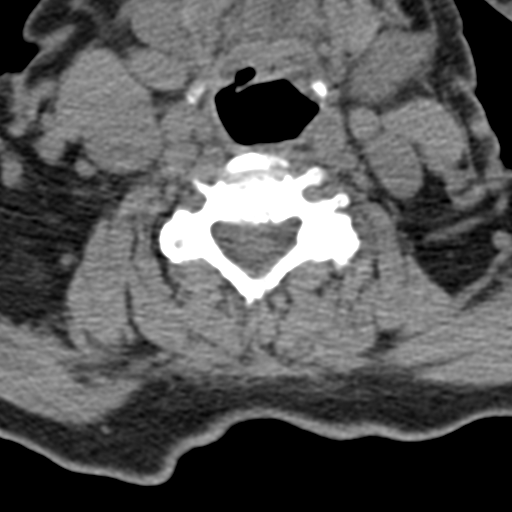
[im 50/84  soft-tissue]
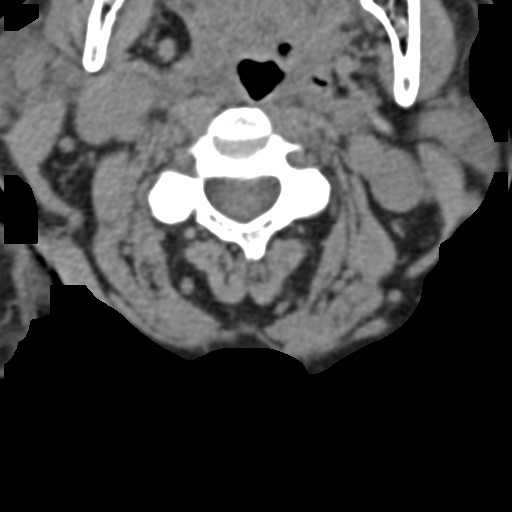
[im 67/84  soft-tissue]
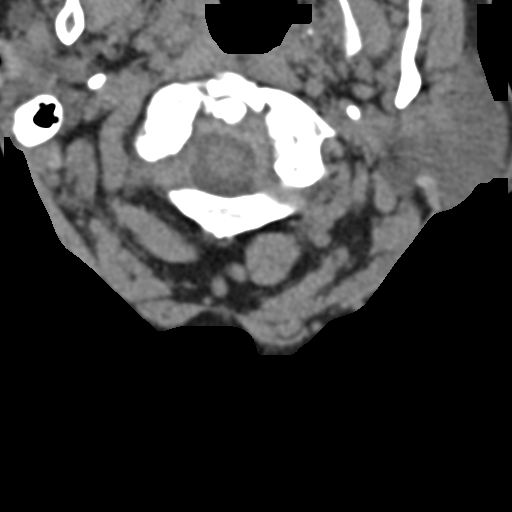

[Series 11: orthogonal bone · axial · 0.28mm/px · z∈[-254,-152]mm · 4 of 91 slices shown, 5 images]
[im 19/91  soft-tissue]
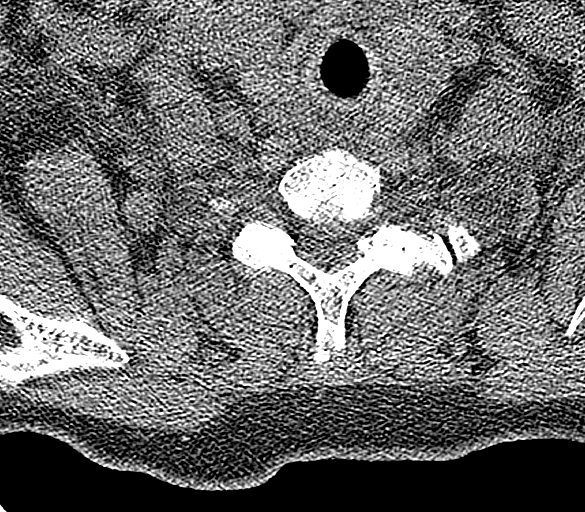
[im 19/91  bone]
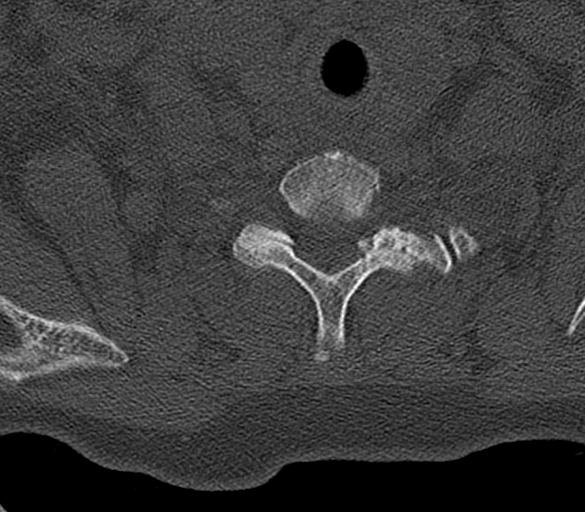
[im 37/91  bone]
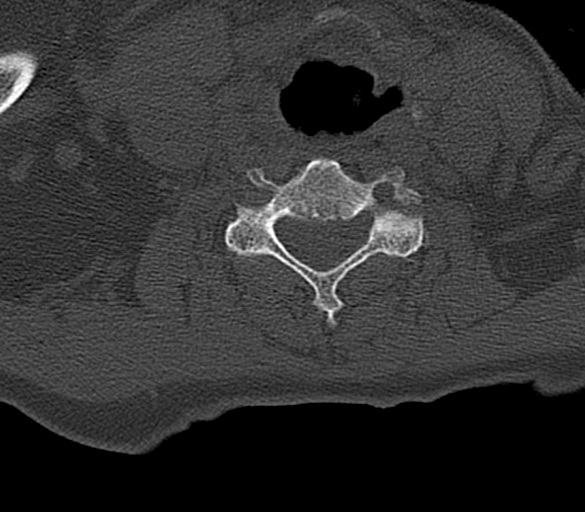
[im 55/91  bone]
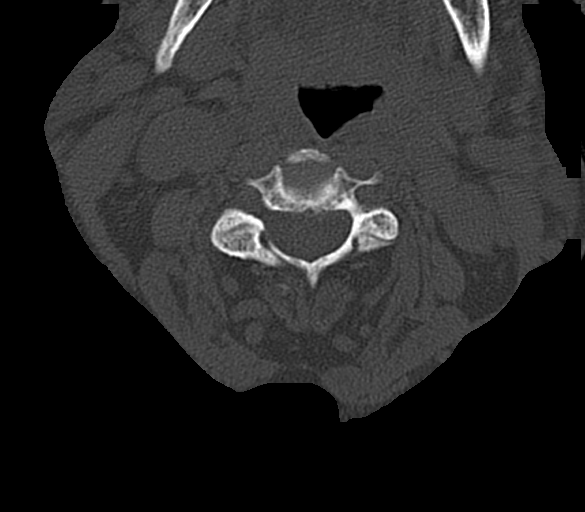
[im 73/91  bone]
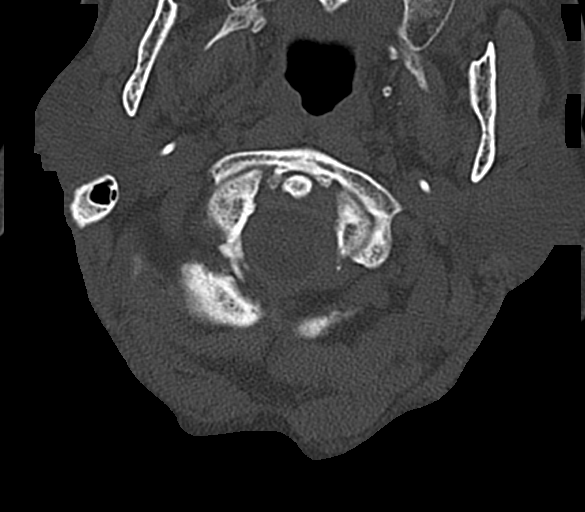

[Series 13: sagittal bone · sagittal · 0.32mm/px · 5 of 51 slices shown]
[im 9/51  bone]
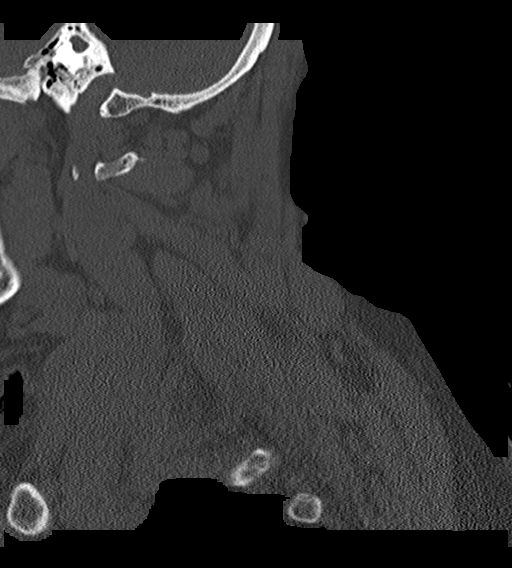
[im 17/51  bone]
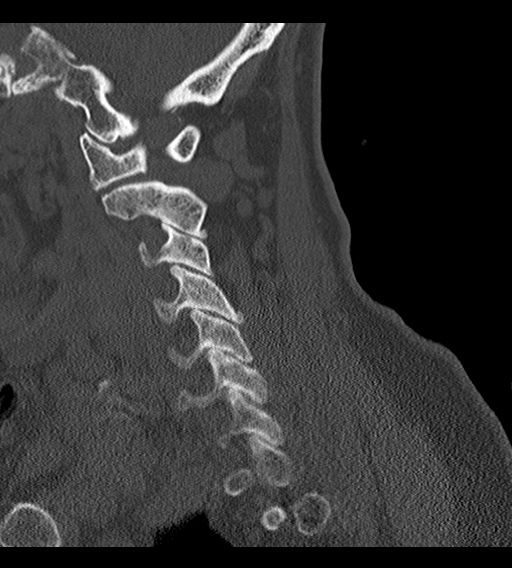
[im 26/51  bone]
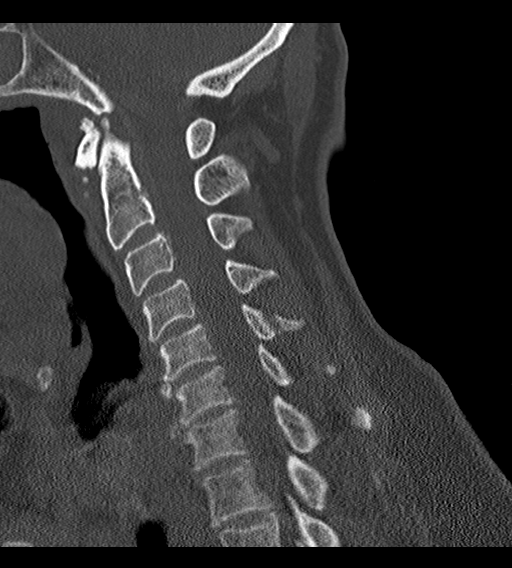
[im 34/51  bone]
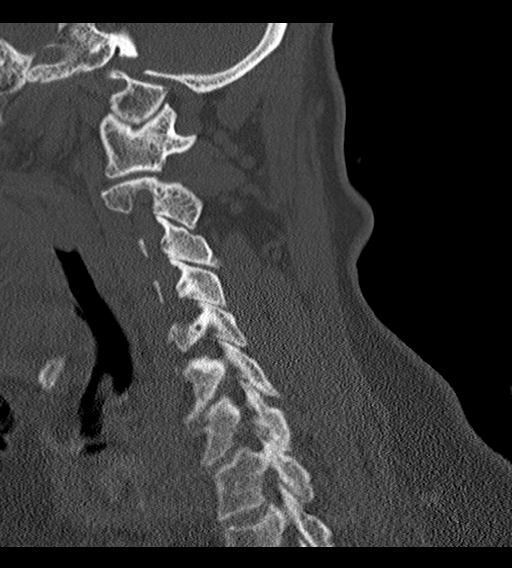
[im 42/51  bone]
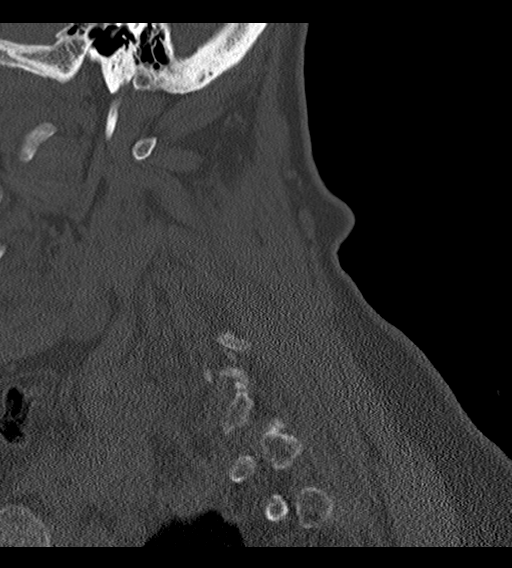

[16 of 33 positions shown; findings below may reference images not displayed]

FINDINGS: CT HEAD FINDINGS

Brain: Ventricles are stable. Chronic small vessel ischemic changes
again noted within the bilateral periventricular and subcortical
white matter regions. No mass, hemorrhage, edema or other evidence
of acute parenchymal abnormality. No extra-axial hemorrhage.

Vascular: No hyperdense vessel or unexpected calcification.

Skull: Normal. Negative for fracture or focal lesion.

Sinuses/Orbits: Mucosal thickening within the sphenoid sinus,
chronic. Periorbital and retro-orbital soft tissues are
unremarkable.

Other: None.

CT CERVICAL SPINE FINDINGS

Alignment: Straightening of the normal cervical spine lordosis,
likely related to patient positioning or muscle spasm. No evidence
of acute vertebral body subluxation.

Skull base and vertebrae: No fracture line or displaced fracture
fragment seen. Facet joints are normally aligned throughout.

Soft tissues and spinal canal: No prevertebral fluid or swelling. No
visible canal hematoma.

Disc levels: Mild degenerative spondylosis within the lower cervical
spine. No more than mild central canal stenosis at any level.

Upper chest: No acute findings.

Other: None.
IMPRESSION: 1. No acute intracranial abnormality. No intracranial mass,
hemorrhage or edema. No skull fracture. Chronic small vessel
ischemic changes in the white matter.
2. No fracture or acute subluxation within the cervical spine. Mild
degenerative change in the lower cervical spine. Straightening of
the normal cervical spine lordosis is likely related to patient
positioning or muscle spasm.

## 2018-09-09 IMAGING — CR RIGHT HAND - COMPLETE 3+ VIEW
3 series · 3 of 3 positions shown · non-contrast
Comparison: None.

CLINICAL DATA: Fall, RIGHT hand pain

EXAM:
RIGHT HAND - COMPLETE 3+ VIEW

[x hand pa right]
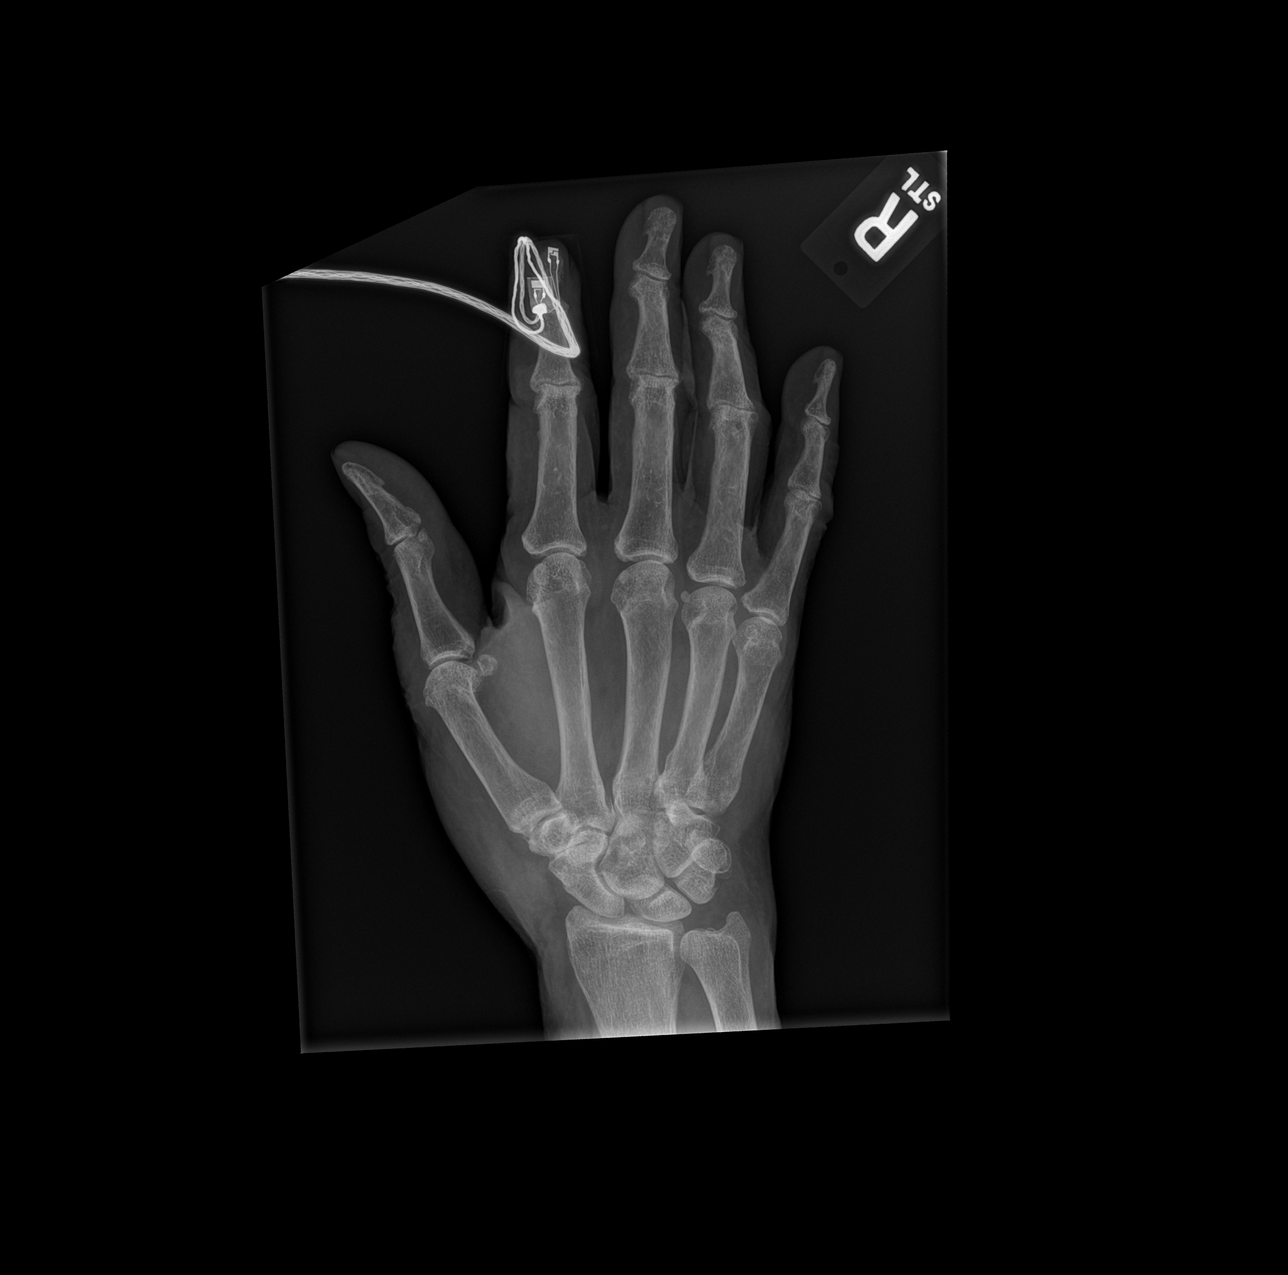

[x hand obl right]
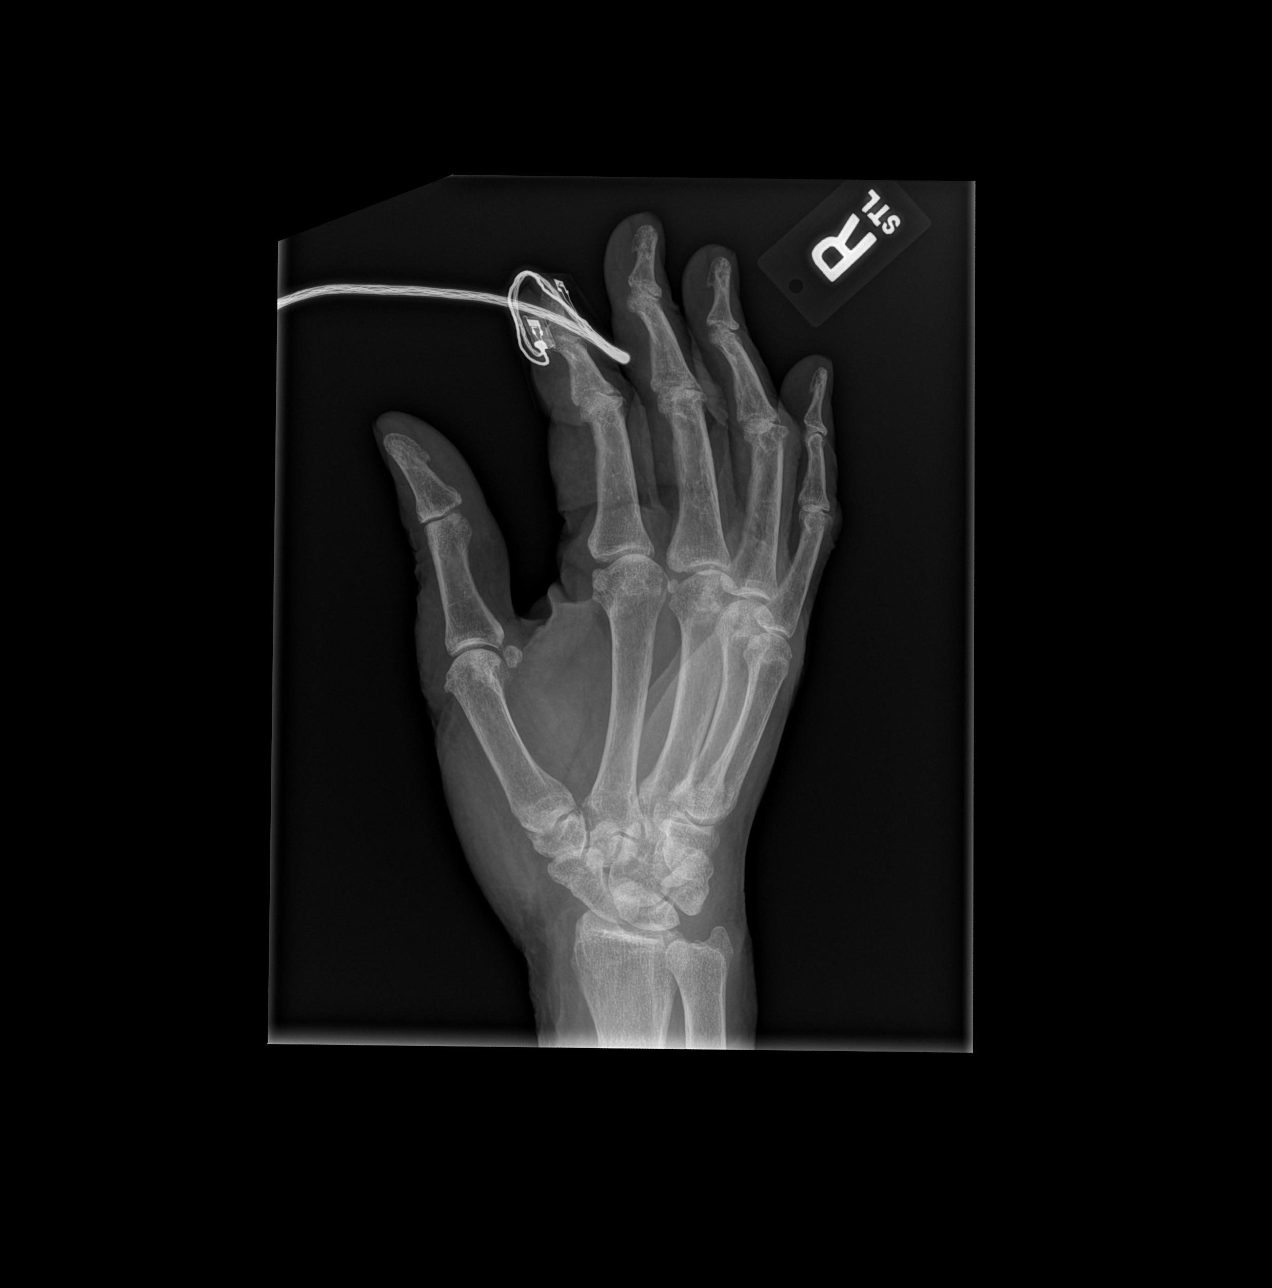

[x hand lat right]
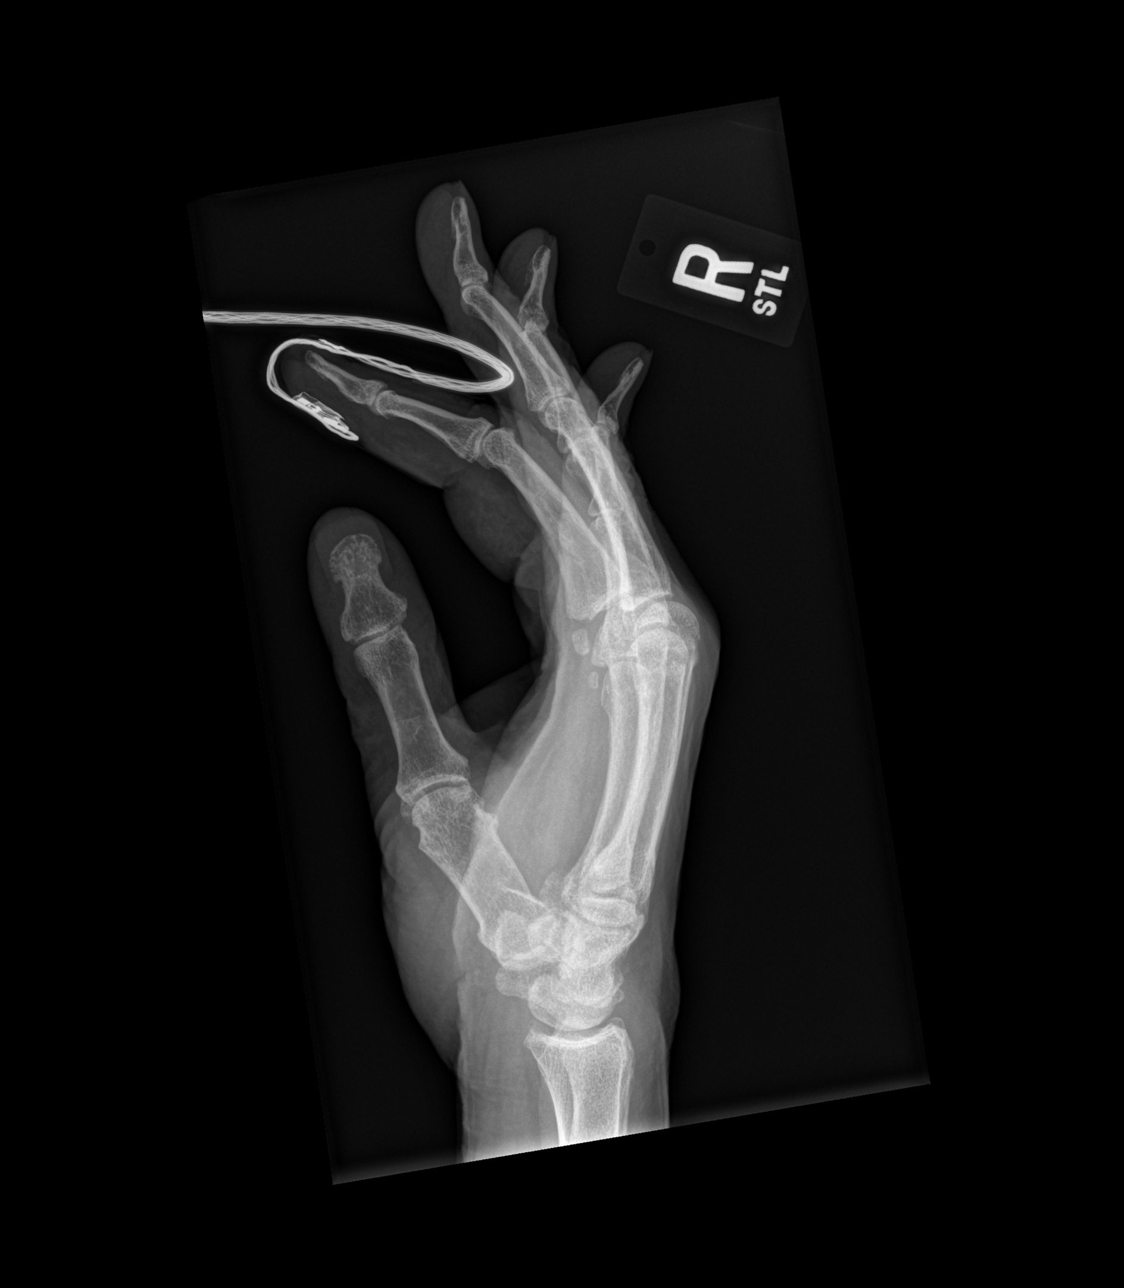

[3 of 3 positions shown; findings below may reference images not displayed]

FINDINGS: Osseous alignment is normal. No fracture line or displaced fracture
fragment seen. Mild degenerative change throughout the
interphalangeal joint spaces. Additional mild degenerative change at
the first CMC joint.
IMPRESSION: No acute findings. No osseous fracture or dislocation.

## 2018-09-09 IMAGING — CR RIGHT KNEE - COMPLETE 4+ VIEW
5 series · 5 of 5 positions shown · non-contrast
Comparison: None.

CLINICAL DATA: Pain following fall

EXAM:
RIGHT KNEE - COMPLETE 4+ VIEW

[x knee ap right (1 of 5)]
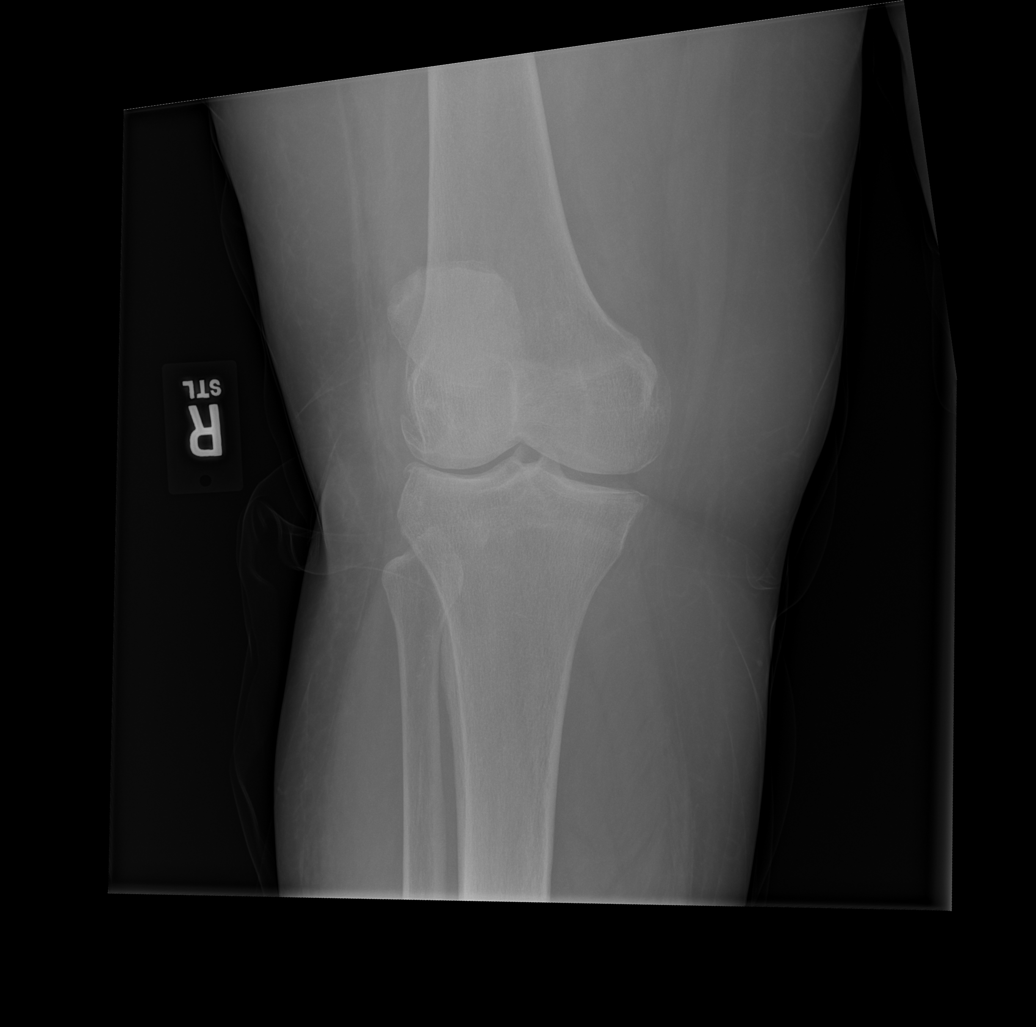

[x knee ap right (2 of 5)]
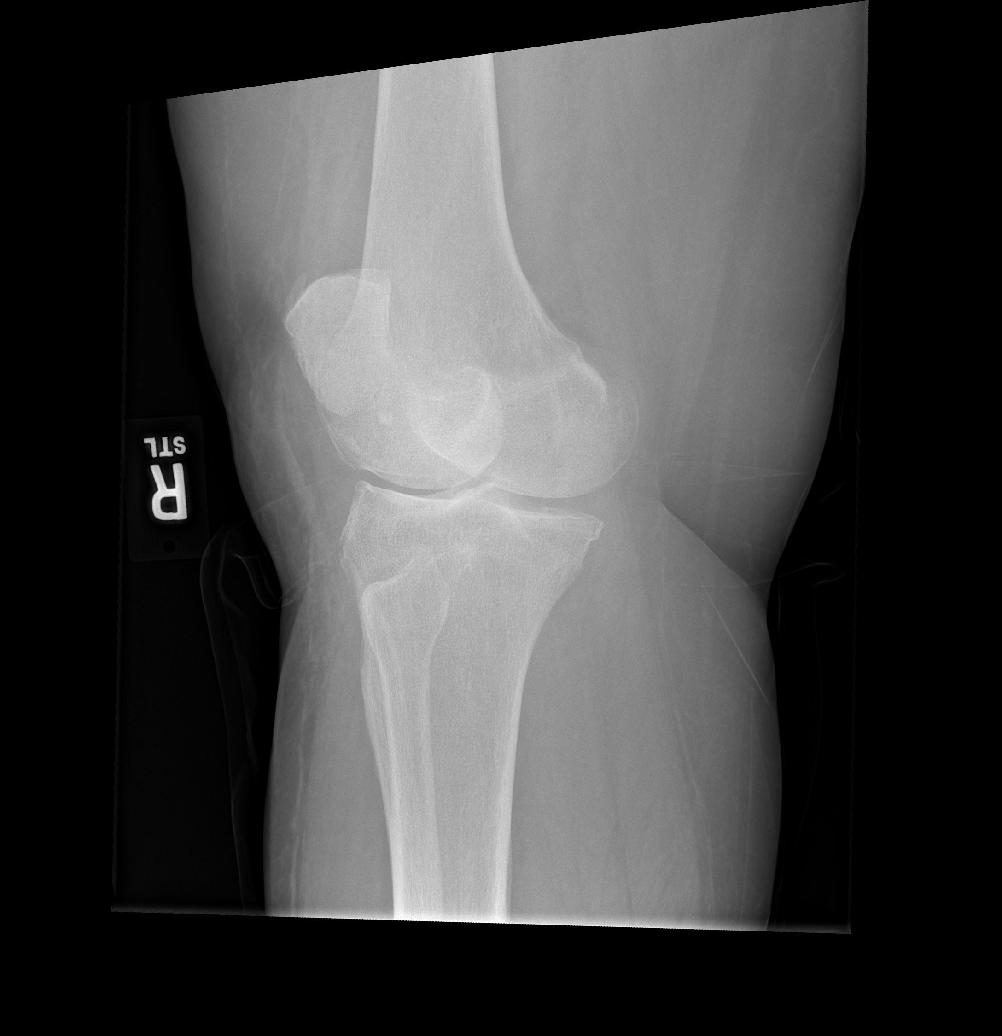

[x knee ap right (3 of 5)]
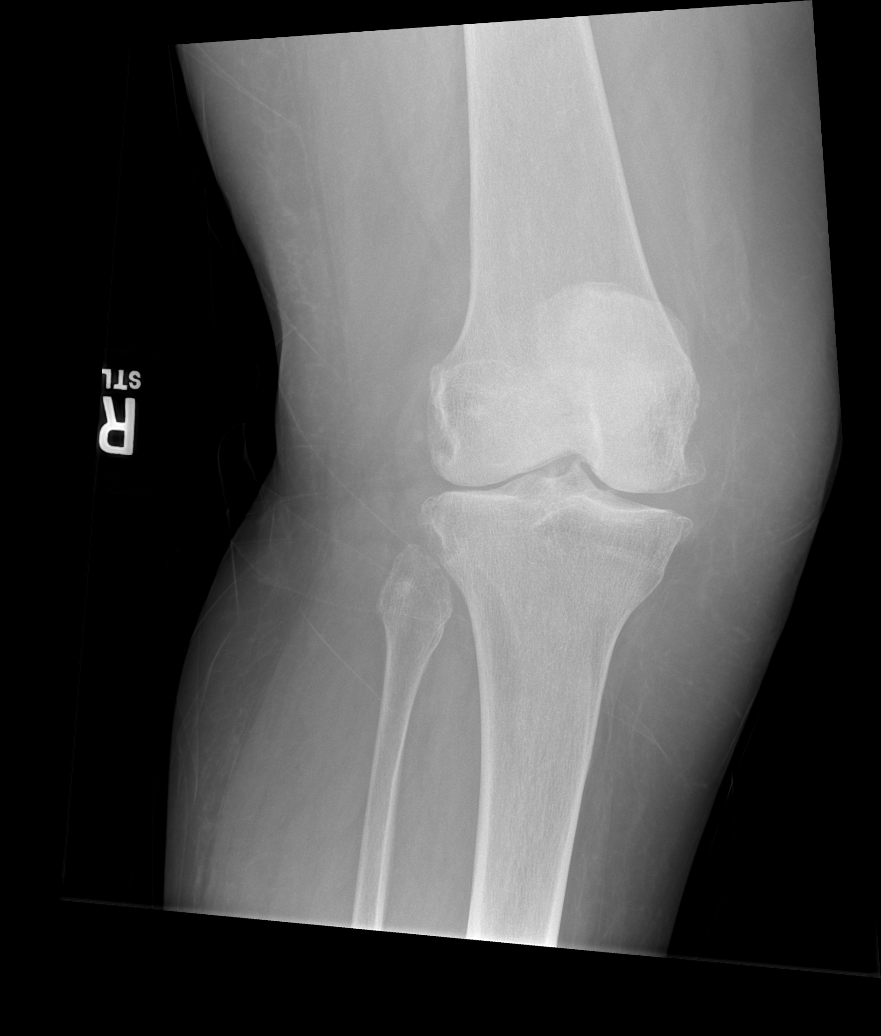

[x knee ap right (4 of 5)]
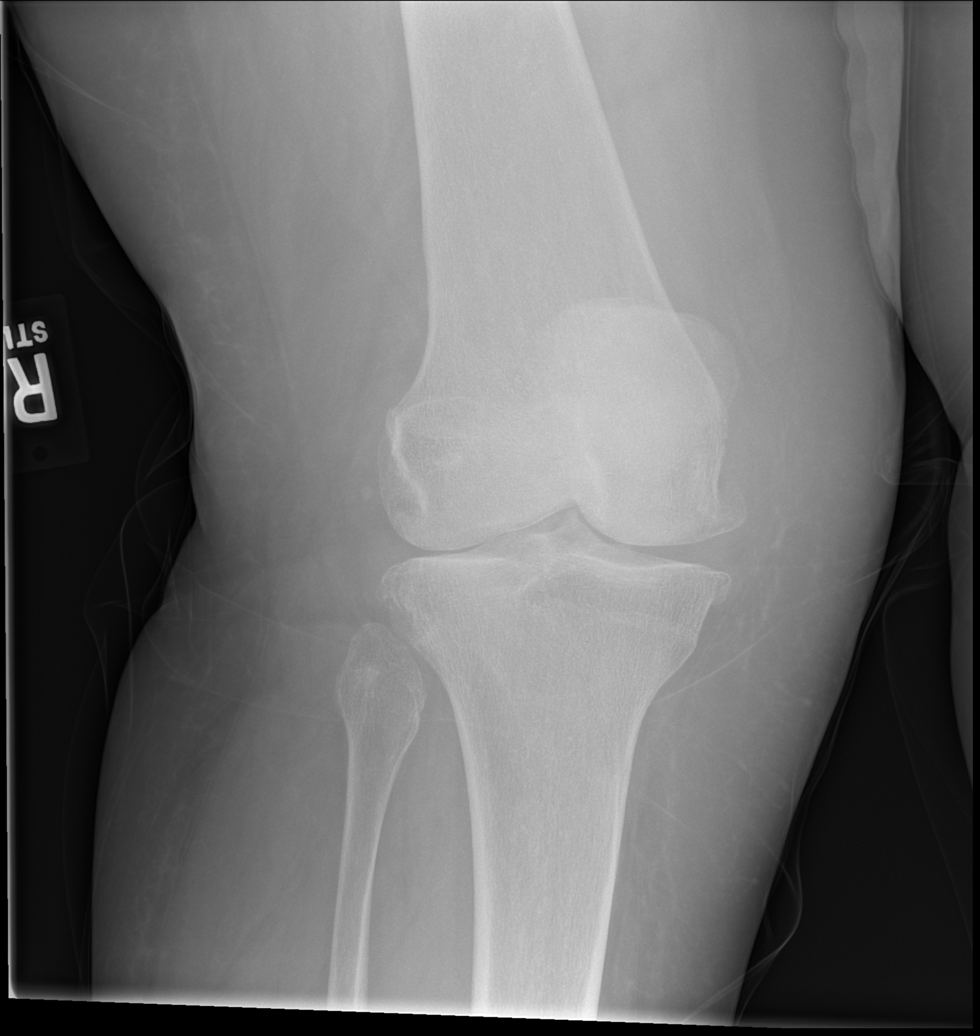

[x knee ap right (5 of 5)]
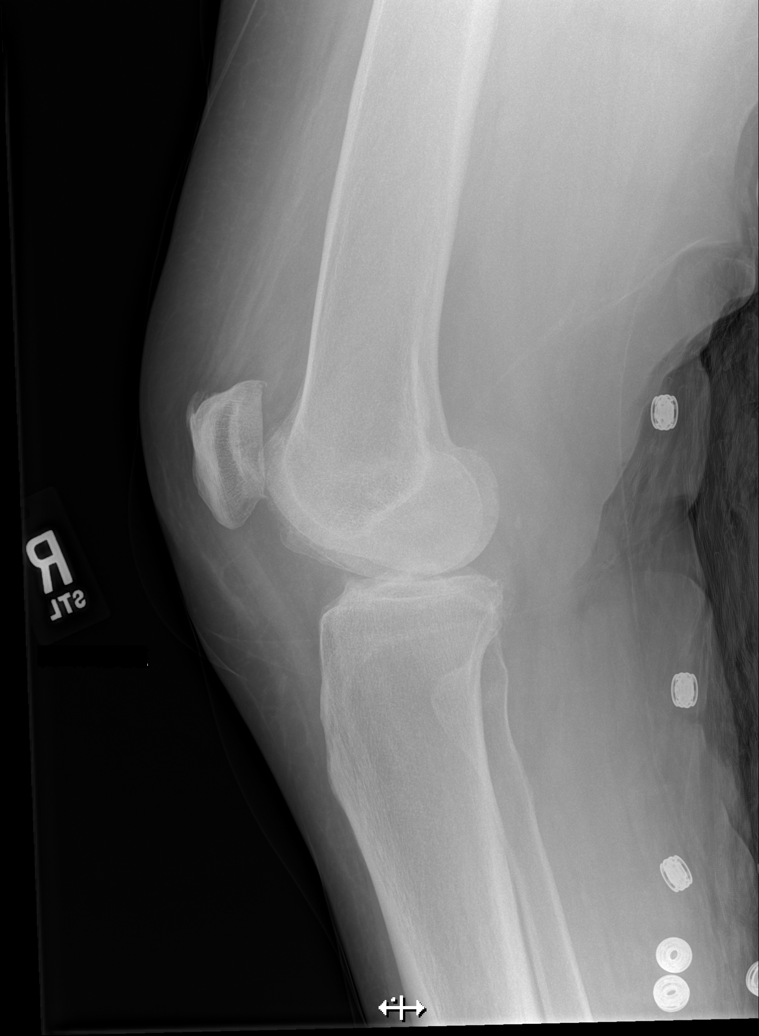

[5 of 5 positions shown; findings below may reference images not displayed]

FINDINGS: Frontal, lateral, and bilateral oblique views were obtained. There
is no fracture or dislocation. There is mild lateral patellar
subluxation. There is no evident joint effusion.

There is severe joint space narrowing laterally. There is milder
joint space narrowing in the patellofemoral joint. There is spurring
in all compartments. No erosive change.
IMPRESSION: Lateral patellar subluxation. No fracture or frank dislocation. No
joint effusion.

There is osteoarthritic change with severe narrowing laterally and
moderate narrowing in the patellofemoral joint. There is spurring in
all compartments.

## 2018-09-09 MED ORDER — HYDROCODONE-ACETAMINOPHEN 5-325 MG PO TABS
0.5000 | ORAL_TABLET | Freq: Once | ORAL | Status: AC
  Administered 2018-09-09: 12:00:00 0.5 via ORAL
  Filled 2018-09-09: qty 1

## 2018-09-09 MED ORDER — CHLORHEXIDINE GLUCONATE 0.12 % MT SOLN: 15.0000 mL | Freq: Four times a day (QID) | OROMUCOSAL | 0 refills | Status: DC

## 2018-09-09 MED ORDER — TETANUS-DIPHTHERIA TOXOIDS TD 5-2 LFU IM INJ
0.5000 mL | INJECTION | Freq: Once | INTRAMUSCULAR | Status: AC
  Administered 2018-09-09: 13:00:00 0.5 mL via INTRAMUSCULAR
  Filled 2018-09-09: qty 0.5

## 2018-09-09 MED ORDER — HYDROCODONE-ACETAMINOPHEN 5-325 MG PO TABS: 0.5000 | ORAL_TABLET | Freq: Four times a day (QID) | ORAL | 0 refills | Status: DC | PRN

## 2018-09-09 MED ORDER — LIDOCAINE-EPINEPHRINE 2 %-1:100000 IJ SOLN
1.7000 mL | Freq: Once | INTRAMUSCULAR | Status: AC
  Administered 2018-09-09: 1.7 mL
  Filled 2018-09-09: qty 1

## 2018-09-09 NOTE — ED Notes (Signed)
RN attempted to assess patients mouth, but unsuccessful due to patients anxiety. Patient did not want RN to touch her mouth. RN observed lac on lower lip, but unable to assess any further. Patient has complete upper and lower dentures. Patient visually upset. Provided patient with ice pack at this time.

## 2018-09-09 NOTE — ED Notes (Signed)
Patient ambulated to and from the restroom. Steady gait; slow and shuffling. Reported pain in knee, but tolerable. Patient reported her pain is now a 6/10. Reports pain is only in the upper lip, and her bottom lip feels ok.

## 2018-09-09 NOTE — ED Provider Notes (Signed)
[name] Provider Note   CSN: [id number] Arrival date & time: 09/09/18  1101    History   Chief Complaint Chief Complaint  Patient presents with   Fall    HPI [name] is a 85 y.o. female.     The history is provided by the patient and medical records. No language interpreter was used.  Fall    [name] is a 85 y.o. female who presents to the Emergency Department complaining of fall. She presents the emergency department by private vehicle for evaluation of injuries following a fall that occurred earlier today. She was attempting to get into her car when she tripped and fell. She states that she struck her head in her right knee and right hand. She has pain to the lip, hand and knee. She has been able to walk but is painful to walk. She denies any recent illnesses. She denies any syncope. Symptoms are moderate and constant nature. No chest pain, shortness of breath, fevers, nausea, vomiting, dysuria, abdominal pain. Past Medical History:  Diagnosis Date   Allergic rhinitis    Arthritis    Carpal tunnel syndrome    Deep venous thrombosis (HCC)    bilateral legs   Degenerative arthritis    right knee   Hypertension    Lumbar degenerative disc disease    Lump or mass in breast    Paresthesia    Syncope 05/24/2013   Vitamin D deficiency     Patient Active Problem List   Diagnosis Date Noted   Presbycusis of both ears 09/22/2015   Syncope 05/24/2013    Past Surgical History:  Procedure Laterality Date   ABDOMINAL HYSTERECTOMY     CARPAL TUNNEL RELEASE Right    CATARACT EXTRACTION Bilateral    HEMORROIDECTOMY     IVC Filter     TOTAL HIP ARTHROPLASTY Right    ULNAR NERVE TRANSPOSITION Right      OB History   No obstetric history on file.      Home Medications    Prior to Admission medications   Medication Sig Start Date End Date Taking? Authorizing Provider  acetaminophen (TYLENOL)  325 MG tablet Take 650 mg by mouth every 6 (six) hours as needed (for pain).   Yes [provider]  albuterol (PROAIR HFA) 108 (90 Base) MCG/ACT inhaler Inhale 1-2 puffs into the lungs every 6 (six) hours as needed for wheezing or shortness of breath.   Yes [provider]  amLODipine (NORVASC) 5 MG tablet Take 5 mg by mouth daily.  06/05/17  Yes Shelda Pal, DO  aspirin 81 MG tablet Take 81 mg by mouth daily.   Yes [provider]  diclofenac sodium (VOLTAREN) 1 % GEL Apply 2-4 g topically 4 (four) times daily as needed (for knee pain).   Yes [provider]  ergocalciferol (VITAMIN D2) 50000 UNITS capsule Take 50,000 Units by mouth 2 (two) times a week.    Yes [provider]  gabapentin (NEURONTIN) 300 MG capsule Take 300 mg by mouth daily.    Yes [provider]  lisinopril-hydrochlorothiazide (PRINZIDE,ZESTORETIC) 20-12.5 MG per tablet Take 1 tablet by mouth daily.   Yes [provider]  Multiple Vitamin (MULTIVITAMIN WITH MINERALS) TABS tablet Take 1 tablet by mouth 2 (two) times a week.    Yes [provider]  chlorhexidine (PERIDEX) 0.12 % solution Use as directed 15 mLs in the mouth or throat 4 (four) times daily. Swish  and spit after meals 09/09/18   Quintella Reichert, MD  doxycycline (VIBRAMYCIN) 100 MG capsule Take 1 capsule (100 mg total) by mouth 2 (two) times daily. One po bid x 7 days Patient not taking: Reported on 09/09/2018 12/17/17   Mesner, Corene Cornea, MD  HYDROcodone-acetaminophen (NORCO/VICODIN) 5-325 MG tablet Take 0.5 tablets by mouth every 6 (six) hours as needed. 09/09/18   Quintella Reichert, MD  ibuprofen (ADVIL,MOTRIN) 800 MG tablet Take 1 tablet (800 mg total) by mouth 3 (three) times daily. Patient not taking: Reported on 09/09/2018 12/17/17   Mesner, Corene Cornea, MD  metoCLOPramide (REGLAN) 10 MG tablet Take 1 tablet (10 mg total) by mouth every 6 (six) hours as needed for nausea (nausea/headache). Patient not  taking: Reported on 09/09/2018 12/17/17   Mesner, Corene Cornea, MD    Family History Family History  Problem Relation Age of Onset   Diabetes Mother    Kidney failure Father    Cancer - Lung Brother    Cancer - Prostate Brother    Kidney failure Son     Social History Social History   Tobacco Use   Smoking status: Current Every Day Smoker    Packs/day: 10.00    Years: 30.00    Pack years: 300.00   Smokeless tobacco: Never Used  Substance Use Topics   Alcohol use: Yes    Comment: occasional   Drug use: No     Allergies   Codeine and Penicillins   Review of Systems Review of Systems  All other systems reviewed and are negative.    Physical Exam Updated Vital Signs BP (!) 171/87    Pulse 64    Temp 98 F (36.7 C) (Oral)    Resp 18    Wt 99.8 kg    SpO2 97%    BMI 32.02 kg/m   Physical Exam Vitals signs and nursing note reviewed.  Constitutional:      Appearance: She is well-developed.  HENT:     Head: Normocephalic.     Comments: Edentulous. There is maceration of the upper inner lip.  The lower inner lip has an L shaped irregular laceration.  The outer lip has an approximately 0.5cm well approximated laceration.  Moderate lip edema.   Eyes:     Extraocular Movements: Extraocular movements intact.     Pupils: Pupils are equal, round, and reactive to light.  Cardiovascular:     Rate and Rhythm: Normal rate and regular rhythm.     Heart sounds: No murmur.  Pulmonary:     Effort: Pulmonary effort is normal. No respiratory distress.     Breath sounds: Normal breath sounds.  Abdominal:     Palpations: Abdomen is soft.     Tenderness: There is no abdominal tenderness. There is no guarding or rebound.  Musculoskeletal:     Comments: Tenderness and swelling to the right dorsal hand. No snuff box tenderness to palpation. Decreased range of motion in the thumb secondary to pain in the hand. 2+ radial pulses bilaterally. 2+ DP pulses bilaterally. There is no hip  tenderness to palpation. There is tenderness to palpation over the right anterior knee. No lateral knee tenderness to palpation. Flexion/extension is intact at the knee.  Skin:    General: Skin is warm and dry.  Neurological:     Mental Status: She is alert and oriented to person, place, and time.  Psychiatric:        Behavior: Behavior normal.      ED Treatments / Results  Labs (all labs ordered are listed, but only abnormal results are displayed) Labs Reviewed - No data to display  EKG None  Radiology Ct Head Wo Contrast  Result Date: 09/09/2018 CLINICAL DATA:  Fall today, laceration to lip, head trauma. EXAM: CT HEAD WITHOUT CONTRAST CT CERVICAL SPINE WITHOUT CONTRAST TECHNIQUE: Multidetector CT imaging of the head and cervical spine was performed following the standard protocol without intravenous contrast. Multiplanar CT image reconstructions of the cervical spine were also generated. COMPARISON:  Head CT dated 12/17/2017. FINDINGS: CT HEAD FINDINGS Brain: Ventricles are stable. Chronic small vessel ischemic changes again noted within the bilateral periventricular and subcortical white matter regions. No mass, hemorrhage, edema or other evidence of acute parenchymal abnormality. No extra-axial hemorrhage. Vascular: No hyperdense vessel or unexpected calcification. Skull: Normal. Negative for fracture or focal lesion. Sinuses/Orbits: Mucosal thickening within the sphenoid sinus, chronic. Periorbital and retro-orbital soft tissues are unremarkable. Other: None. CT CERVICAL SPINE FINDINGS Alignment: Straightening of the normal cervical spine lordosis, likely related to patient positioning or muscle spasm. No evidence of acute vertebral body subluxation. Skull base and vertebrae: No fracture line or displaced fracture fragment seen. Facet joints are normally aligned throughout. Soft tissues and spinal canal: No prevertebral fluid or swelling. No visible canal hematoma. Disc levels: Mild  degenerative spondylosis within the lower cervical spine. No more than mild central canal stenosis at any level. Upper chest: No acute findings. Other: None. IMPRESSION: 1. No acute intracranial abnormality. No intracranial mass, hemorrhage or edema. No skull fracture. Chronic small vessel ischemic changes in the white matter. 2. No fracture or acute subluxation within the cervical spine. Mild degenerative change in the lower cervical spine. Straightening of the normal cervical spine lordosis is likely related to patient positioning or muscle spasm. Electronically Signed   By: Franki Cabot M.D.   On: 09/09/2018 11:50   Ct Cervical Spine Wo Contrast  Result Date: 09/09/2018 CLINICAL DATA:  Fall today, laceration to lip, head trauma. EXAM: CT HEAD WITHOUT CONTRAST CT CERVICAL SPINE WITHOUT CONTRAST TECHNIQUE: Multidetector CT imaging of the head and cervical spine was performed following the standard protocol without intravenous contrast. Multiplanar CT image reconstructions of the cervical spine were also generated. COMPARISON:  Head CT dated 12/17/2017. FINDINGS: CT HEAD FINDINGS Brain: Ventricles are stable. Chronic small vessel ischemic changes again noted within the bilateral periventricular and subcortical white matter regions. No mass, hemorrhage, edema or other evidence of acute parenchymal abnormality. No extra-axial hemorrhage. Vascular: No hyperdense vessel or unexpected calcification. Skull: Normal. Negative for fracture or focal lesion. Sinuses/Orbits: Mucosal thickening within the sphenoid sinus, chronic. Periorbital and retro-orbital soft tissues are unremarkable. Other: None. CT CERVICAL SPINE FINDINGS Alignment: Straightening of the normal cervical spine lordosis, likely related to patient positioning or muscle spasm. No evidence of acute vertebral body subluxation. Skull base and vertebrae: No fracture line or displaced fracture fragment seen. Facet joints are normally aligned throughout. Soft  tissues and spinal canal: No prevertebral fluid or swelling. No visible canal hematoma. Disc levels: Mild degenerative spondylosis within the lower cervical spine. No more than mild central canal stenosis at any level. Upper chest: No acute findings. Other: None. IMPRESSION: 1. No acute intracranial abnormality. No intracranial mass, hemorrhage or edema. No skull fracture. Chronic small vessel ischemic changes in the white matter. 2. No fracture or acute subluxation within the cervical spine. Mild degenerative change in the lower cervical spine. Straightening of the normal cervical spine lordosis is likely related to patient positioning or muscle spasm. Electronically  Signed   By: Franki Cabot M.D.   On: 09/09/2018 11:50   Dg Knee Complete 4 Views Right  Result Date: 09/09/2018 CLINICAL DATA:  Pain following fall EXAM: RIGHT KNEE - COMPLETE 4+ VIEW COMPARISON:  None. FINDINGS: Frontal, lateral, and bilateral oblique views were obtained. There is no fracture or dislocation. There is mild lateral patellar subluxation. There is no evident joint effusion. There is severe joint space narrowing laterally. There is milder joint space narrowing in the patellofemoral joint. There is spurring in all compartments. No erosive change. IMPRESSION: Lateral patellar subluxation. No fracture or frank dislocation. No joint effusion. There is osteoarthritic change with severe narrowing laterally and moderate narrowing in the patellofemoral joint. There is spurring in all compartments. Electronically Signed   By: Lowella Grip III M.D.   On: 09/09/2018 11:47   Dg Hand Complete Right  Result Date: 09/09/2018 CLINICAL DATA:  Fall, RIGHT hand pain EXAM: RIGHT HAND - COMPLETE 3+ VIEW COMPARISON:  None. FINDINGS: Osseous alignment is normal. No fracture line or displaced fracture fragment seen. Mild degenerative change throughout the interphalangeal joint spaces. Additional mild degenerative change at the first [facility] joint.  IMPRESSION: No acute findings. No osseous fracture or dislocation. Electronically Signed   By: Franki Cabot M.D.   On: 09/09/2018 11:46    Procedures .Marland KitchenLaceration Repair Date/Time: 09/09/2018 2:35 PM Performed by: Quintella Reichert, MD Authorized by: Quintella Reichert, MD   Consent:    Consent obtained:  Verbal   Consent given by:  Patient   Risks discussed:  Infection, pain and poor cosmetic result   Alternatives discussed:  No treatment Anesthesia (see MAR for exact dosages):    Anesthesia method:  Local infiltration   Local anesthetic:  Lidocaine 2% WITH epi Laceration details:    Location:  Lip   Lip location:  Lower interior lip   Length (cm):  2.5 Repair type:    Repair type:  Intermediate Exploration:    Hemostasis achieved with:  Direct pressure   Wound exploration: entire depth of wound probed and visualized     Contaminated: no   Treatment:    Area cleansed with:  Saline and Shur-Clens   Amount of cleaning:  Standard   Irrigation solution:  Sterile saline Skin repair:    Repair method:  Sutures   Suture size:  4-0   Wound skin closure material used: vicryl rapide.   Suture technique:  Simple interrupted   Number of sutures:  3 Post-procedure details:    Dressing:  Open (no dressing)   (including critical care time)  Medications Ordered in ED Medications  HYDROcodone-acetaminophen (NORCO/VICODIN) 5-325 MG per tablet 0.5 tablet (0.5 tablets Oral Given 09/09/18 1210)  lidocaine-EPINEPHrine (XYLOCAINE W/EPI) 2 %-1:100000 (with pres) injection 1.7 mL (1.7 mLs Infiltration Given 09/09/18 1232)  tetanus & diphtheria toxoids (adult) (TENIVAC) injection 0.5 mL (0.5 mLs Intramuscular Given 09/09/18 1257)     Initial Impression / Assessment and Plan / ED Course  I have reviewed the triage vital signs and the nursing notes.  Pertinent labs & imaging results that were available during my care of the patient were reviewed by me and considered in my medical decision making  (see chart for details).        Patient here for evaluation of injuries following a mechanical fall. She does have a lip laceration on the inner lower lip that required repair due to persistent bleeding as well as gaping borders. She has an inner upper lip laceration that is well  approximated and will not need suturing. She also has an outer lip laceration that will not need suturing. Imaging is negative for acute intracranial abnormality or C-spine fracture/dislocation. She does have tenderness to the right anterior knee but is able to range it in the department at bear weight on it. Imaging of the right knee does demonstrate lateral subluxation of the patella. Discussed with Dr. Sharol Given, with orthopedics. Plan to have the patient follow-up with orthopedics as an outpatient. Counseled patient on home care for lip laceration as well as knee contusion. Discussed treatment plan with the patient and the daughter. Discussed outpatient follow-up and return precautions.  Final Clinical Impressions(s) / ED Diagnoses   Final diagnoses:  Fall, initial encounter  Lip laceration, initial encounter  Contusion of right hand, initial encounter  Contusion of right knee, initial encounter    ED Discharge Orders         Ordered    chlorhexidine (PERIDEX) 0.12 % solution  4 times daily     09/09/18 1413    HYDROcodone-acetaminophen (NORCO/VICODIN) 5-325 MG tablet  Every 6 hours PRN     09/09/18 1415           Quintella Reichert, MD 09/09/18 1440

## 2018-09-09 NOTE — Discharge Instructions (Addendum)
You had dissolvable sutures placed on your inner lip.  These will dissolve on their own in 3-5 days.  Please come back to the Emergency Department  for suture removal if they do not fall out in five days.    Eat a soft diet until your mouth wounds heal.

## 2018-09-09 NOTE — ED Triage Notes (Signed)
Pt states she was trying to get to a car and fell today. Pt has lac on lip center, pain on right knee, and right hand.

## 2018-12-14 ENCOUNTER — Other Ambulatory Visit: Payer: Self-pay

## 2018-12-14 ENCOUNTER — Encounter: Payer: Self-pay | Admitting: Podiatry

## 2018-12-14 ENCOUNTER — Ambulatory Visit (INDEPENDENT_AMBULATORY_CARE_PROVIDER_SITE_OTHER): Payer: Medicare Other | Admitting: Podiatry

## 2018-12-14 DIAGNOSIS — M79674 Pain in right toe(s): Secondary | ICD-10-CM

## 2018-12-14 DIAGNOSIS — B351 Tinea unguium: Secondary | ICD-10-CM | POA: Diagnosis not present

## 2018-12-14 DIAGNOSIS — M79675 Pain in left toe(s): Secondary | ICD-10-CM

## 2018-12-14 NOTE — Progress Notes (Signed)
Complaint:  Visit Type: Patient presents  to my office for  preventative foot care services. Complaint: Patient states" my nails have grown long and thick and become painful to walk and wear shoes" P The patient presents for preventative foot care services. No changes to ROS  Podiatric Exam: Vascular: dorsalis pedis and posterior tibial pulses are palpable bilateral. Capillary return is immediate. Temperature gradient is WNL. Skin turgor WNL  Sensorium: Normal Semmes Weinstein monofilament test. Normal tactile sensation bilaterally. Nail Exam: Pt has thick disfigured discolored nails with subungual debris noted bilateral entire nail hallux through fifth toenails Ulcer Exam: There is no evidence of ulcer or pre-ulcerative changes or infection. Orthopedic Exam: Muscle tone and strength are WNL. No limitations in general ROM. No crepitus or effusions noted. Foot type and digits show no abnormalities. Bony prominences are unremarkable. Skin: No Porokeratosis. No infection or ulcers  Diagnosis:  Onychomycosis, , Pain in right toe, pain in left toes  Treatment & Plan Procedures and Treatment: Consent by patient was obtained for treatment procedures.   Debridement of mycotic and hypertrophic toenails, 1 through 5 bilateral and clearing of subungual debris. No ulceration, no infection noted.  Return Visit-Office Procedure: Patient instructed to return to the office for a follow up visit 3 months for continued evaluation and treatment.    Gardiner Barefoot DPM

## 2018-12-21 ENCOUNTER — Telehealth: Payer: Self-pay | Admitting: Orthopaedic Surgery

## 2018-12-21 NOTE — Telephone Encounter (Signed)
Patient called asked if she can get the gel injection for both knees. The number to contact patient is [phone]

## 2018-12-25 ENCOUNTER — Telehealth: Payer: Self-pay | Admitting: Orthopaedic Surgery

## 2018-12-25 NOTE — Telephone Encounter (Signed)
Noted  

## 2018-12-25 NOTE — Telephone Encounter (Signed)
Patient returned call I read note from April to patient and patient would like to get the injection as soon as possible. The number to contact patient is [phone]

## 2018-12-25 NOTE — Telephone Encounter (Signed)
Patient called. Said she was waiting for April to call her. Her call back number is [phone]

## 2018-12-25 NOTE — Telephone Encounter (Signed)
Tried calling patient back, but no answer and no VM to leave a message to let patient know that I have a message to submit for gel injections.  Thank you.

## 2018-12-25 NOTE — Telephone Encounter (Signed)
Can we get this for her please, thanks

## 2018-12-25 NOTE — Telephone Encounter (Signed)
Talked with patient concerning gel injections. 

## 2018-12-31 ENCOUNTER — Telehealth: Payer: Self-pay

## 2018-12-31 NOTE — Telephone Encounter (Signed)
Submitted VOB for SynviscOne, bilateral knee. 

## 2019-01-01 ENCOUNTER — Telehealth: Payer: Self-pay

## 2019-01-01 NOTE — Telephone Encounter (Signed)
Patient aware that she is approved for gel injection.  Approved for SynviscOne, bilateral knee. [place] Patient will be responsible for 20% OOP. No Co-pay No PA required  Appt. 01/02/2019 with Dr. Ninfa Linden

## 2019-01-02 ENCOUNTER — Ambulatory Visit (INDEPENDENT_AMBULATORY_CARE_PROVIDER_SITE_OTHER): Payer: Medicare Other | Admitting: Orthopaedic Surgery

## 2019-01-02 ENCOUNTER — Other Ambulatory Visit: Payer: Self-pay

## 2019-01-02 ENCOUNTER — Encounter: Payer: Self-pay | Admitting: Orthopaedic Surgery

## 2019-01-02 DIAGNOSIS — M170 Bilateral primary osteoarthritis of knee: Secondary | ICD-10-CM | POA: Diagnosis not present

## 2019-01-02 MED ORDER — HYLAN G-F 20 48 MG/6ML IX SOSY
48.0000 mg | PREFILLED_SYRINGE | INTRA_ARTICULAR | Status: AC | PRN
Start: 2019-01-02 — End: 2019-01-02
  Administered 2019-01-02: 48 mg via INTRA_ARTICULAR

## 2019-01-02 NOTE — Progress Notes (Signed)
   Procedure Note  Patient: [name]             Date of Birth: 1929-02-14           MRN: [id number]             Visit Date: 01/02/2019 HPI: Ms. Burrola comes in today for injections in both knees.  She has known osteoarthritis of both knees.  She is had a fall with few months ago actually injury but now from time to have her dentures repaired.  Did not injure either knee.  She states that her knee pain began about a month ago.  Physical exam: General well-developed well-nourished female who walks with a slow antalgic gait and uses a cane.  Bilateral knees: No effusion, abnormal warmth or erythema.   Procedures: Visit Diagnoses:  1. Bilateral primary osteoarthritis of knee     Large Joint Inj on 01/02/2019 10:04 AM Indications: pain Details: 22 G 1.5 in needle, anterolateral approach  Arthrogram: No  Medications: 48 mg Hylan 48 MG/6ML Outcome: tolerated well, no immediate complications Procedure, treatment alternatives, risks and benefits explained, specific risks discussed. Consent was given by the patient. Immediately prior to procedure a time out was called to verify the correct patient, procedure, equipment, support staff and site/side marked as required. Patient was prepped and draped in the usual sterile fashion.      Plan: She will follow-up with [place] on an as-needed basis.  She understands that she needs to wait 3 months between cortisone injections and 6 months between supplemental injections.  Questions were encouraged and answered.

## 2019-03-22 ENCOUNTER — Ambulatory Visit: Payer: Medicare Other | Admitting: Podiatry

## 2019-06-20 ENCOUNTER — Telehealth: Payer: Self-pay | Admitting: Orthopaedic Surgery

## 2019-06-20 NOTE — Telephone Encounter (Signed)
Patient called needing to get set up again for the gel injections.. The number to contact patient is [phone]

## 2019-06-20 NOTE — Telephone Encounter (Signed)
Bilateral gel injections  

## 2019-06-21 ENCOUNTER — Telehealth: Payer: Self-pay

## 2019-06-21 NOTE — Telephone Encounter (Signed)
Approved for Synvisc One-Bilateral knee Dr. Minerva Areola after 07/01/19 Buy and Bill No Copay 20% OOP No prior auth required

## 2019-06-21 NOTE — Telephone Encounter (Signed)
Called and scheduled pt for 07/03/19

## 2019-06-21 NOTE — Telephone Encounter (Signed)
Last injection was on 01/01/20. Submitted for VOB for Synvisc one-Bilateral knee

## 2019-07-03 ENCOUNTER — Ambulatory Visit: Payer: Medicare Other | Admitting: Orthopaedic Surgery

## 2019-07-09 ENCOUNTER — Ambulatory Visit (INDEPENDENT_AMBULATORY_CARE_PROVIDER_SITE_OTHER): Payer: Medicare Other | Admitting: Orthopaedic Surgery

## 2019-07-09 ENCOUNTER — Encounter: Payer: Self-pay | Admitting: Orthopaedic Surgery

## 2019-07-09 ENCOUNTER — Other Ambulatory Visit: Payer: Self-pay

## 2019-07-09 DIAGNOSIS — M1712 Unilateral primary osteoarthritis, left knee: Secondary | ICD-10-CM | POA: Diagnosis not present

## 2019-07-09 DIAGNOSIS — M654 Radial styloid tenosynovitis [de Quervain]: Secondary | ICD-10-CM

## 2019-07-09 DIAGNOSIS — M1711 Unilateral primary osteoarthritis, right knee: Secondary | ICD-10-CM | POA: Diagnosis not present

## 2019-07-09 MED ORDER — LIDOCAINE HCL 1 % IJ SOLN
0.5000 mL | INTRAMUSCULAR | Status: AC | PRN
  Administered 2019-07-09: .5 mL

## 2019-07-09 MED ORDER — HYLAN G-F 20 48 MG/6ML IX SOSY
48.0000 mg | PREFILLED_SYRINGE | INTRA_ARTICULAR | Status: AC | PRN
  Administered 2019-07-09: 48 mg via INTRA_ARTICULAR

## 2019-07-09 MED ORDER — METHYLPREDNISOLONE ACETATE 40 MG/ML IJ SUSP
20.0000 mg | INTRAMUSCULAR | Status: AC | PRN
  Administered 2019-07-09: 20 mg

## 2019-07-09 NOTE — Progress Notes (Signed)
Office Visit Note   Patient: [name]           Date of Birth: [birth year]           MRN: [id number] Visit Date: 07/09/2019              Requested by: Nolene Ebbs, MD [address] [place],  [place] [address] PCP: Nolene Ebbs, MD   Assessment & Plan: Visit Diagnoses:  1. Primary osteoarthritis of right knee   2. Primary osteoarthritis of left knee   3. De Quervain's disease (tenosynovitis)     Plan: She shown a thumb spica brace which she can obtain elsewhere if she wishes to use this.  However she can wait on the injection to begin working and use Voltaren gel and heat to the 1st extensor compartment.  Regards to the thumb she will follow-up as needed.  As for the knees she knows to wait at least 6 months between supplemental injections.  Questions were encouraged and answered.  Follow-Up Instructions: No follow-ups on file.   Orders:  Orders Placed This Encounter  Procedures  . Large Joint Inj  . Hand/UE Inj   No orders of the defined types were placed in this encounter.     Procedures: Large Joint Inj: bilateral knee on 07/09/2019 10:48 AM Indications: pain Details: 22 G 1.5 in needle, superolateral approach  Arthrogram: No  Medications (Right): 0.5 mL lidocaine 1 %; 48 mg Hylan 48 MG/6ML Medications (Left): 0.5 mL lidocaine 1 %; 48 mg Hylan 48 MG/6ML Outcome: tolerated well, no immediate complications Procedure, treatment alternatives, risks and benefits explained, specific risks discussed. Consent was given by the patient. Immediately prior to procedure a time out was called to verify the correct patient, procedure, equipment, support staff and site/side marked as required. Patient was prepped and draped in the usual sterile fashion.   Hand/UE Inj: R extensor compartment 1 for de Quervain's tenosynovitis on 07/09/2019 10:49 AM Medications: 0.5 mL lidocaine 1 %; 20 mg methylPREDNISolone acetate 40 MG/ML Consent was given by the patient. Immediately  prior to procedure a time out was called to verify the correct patient, procedure, equipment, support staff and site/side marked as required. Patient was prepped and draped in the usual sterile fashion.       Clinical Data: No additional findings.   Subjective: Chief Complaint  Patient presents with  . Left Knee - Injections  . Right Knee - Injections    HPI [name] comes in today for bilateral knee Synvisc 1 injections.  She has had no new injury to either knee.  She denies any significant swelling of either knee.  She is ambulating with a cane.  Also she is complaining of right thumb and hand pain since this past Sunday.  States she has been doing a lot of needlepoint work.  Developed some right thumb pain.  No particular injury.  She notes some swelling.  She Voltaren gel and heat on this area feels that the Voltaren gel helps mostly.  Reports she had both Covid vaccine injections more than 3 weeks ago.  Review of Systems See HPI  Objective: Vital Signs: There were no vitals taken for this visit.  Physical Exam Constitutional:      Appearance: She is not ill-appearing or diaphoretic.  Pulmonary:     Effort: Pulmonary effort is normal.  Neurological:     Mental Status: She is alert and oriented to person, place, and time.  Psychiatric:  Mood and Affect: Mood normal.     Ortho Exam Bilateral knees good range of motion of both knees.  No abnormal warmth erythema or effusion of either knee. Right hand no rashes skin lesions ulcerations.  Neurovascular intact.  Positive Finkelstein's.  Negative grind test of the right thumb.  Tenderness over the 1st extensor compartment.  No significant swelling appreciated the thumb or over the 1st extensor compartment of the right thumb.  Specialty Comments:  No specialty comments available.  Imaging: No results found.   PMFS History: Patient Active Problem List   Diagnosis Date Noted  . Pain due to onychomycosis of  toenails of both feet 12/14/2018  . Presbycusis of both ears 09/22/2015  . Syncope 05/24/2013   Past Medical History:  Diagnosis Date  . Allergic rhinitis   . Arthritis   . Carpal tunnel syndrome   . Deep venous thrombosis (HCC)    bilateral legs  . Degenerative arthritis    right knee  . Hypertension   . Lumbar degenerative disc disease   . Lump or mass in breast   . Paresthesia   . Syncope 05/24/2013  . Vitamin D deficiency     Family History  Problem Relation Age of Onset  . Diabetes Mother   . Kidney failure Father   . Cancer - Lung Brother   . Cancer - Prostate Brother   . Kidney failure Son     Past Surgical History:  Procedure Laterality Date  . ABDOMINAL HYSTERECTOMY    . CARPAL TUNNEL RELEASE Right   . CATARACT EXTRACTION Bilateral   . HEMORROIDECTOMY    . IVC Filter    . TOTAL HIP ARTHROPLASTY Right   . ULNAR NERVE TRANSPOSITION Right    Social History   Occupational History  . Occupation: retired  Tobacco Use  . Smoking status: Current Every Day Smoker    Packs/day: 10.00    Years: 30.00    Pack years: 300.00  . Smokeless tobacco: Never Used  Substance and Sexual Activity  . Alcohol use: Yes    Comment: occasional  . Drug use: No  . Sexual activity: Not on file

## 2019-12-26 ENCOUNTER — Telehealth: Payer: Self-pay | Admitting: Orthopaedic Surgery

## 2019-12-26 NOTE — Telephone Encounter (Signed)
Patient called. She would like to have a gel injection. Her call back number is [phone]

## 2019-12-27 ENCOUNTER — Ambulatory Visit (INDEPENDENT_AMBULATORY_CARE_PROVIDER_SITE_OTHER): Payer: Medicare Other | Admitting: Podiatry

## 2019-12-27 ENCOUNTER — Encounter: Payer: Self-pay | Admitting: Podiatry

## 2019-12-27 ENCOUNTER — Other Ambulatory Visit: Payer: Self-pay

## 2019-12-27 DIAGNOSIS — M79674 Pain in right toe(s): Secondary | ICD-10-CM

## 2019-12-27 DIAGNOSIS — M79675 Pain in left toe(s): Secondary | ICD-10-CM

## 2019-12-27 DIAGNOSIS — B351 Tinea unguium: Secondary | ICD-10-CM | POA: Diagnosis not present

## 2019-12-27 NOTE — Progress Notes (Signed)
This patient returns to my office for at risk foot care.  This patient requires this care by a professional since this patient will be at risk due to having neuropathy.    This patient is unable to cut nails herself since the patient cannot reach her nails.These nails are painful walking and wearing shoes.  This patient presents for at risk foot care today.  General Appearance  Alert, conversant and in no acute stress.  Vascular  Dorsalis pedis and posterior tibial  pulses are palpable  bilaterally.  Capillary return is within normal limits  bilaterally. Temperature is within normal limits  bilaterally.  Neurologic  Senn-Weinstein monofilament wire test within normal limits  bilaterally. Muscle power within normal limits bilaterally.  Nails Thick disfigured discolored nails with subungual debris  from hallux to fifth toes bilaterally. No evidence of bacterial infection or drainage bilaterally.  Orthopedic  No limitations of motion  feet .  No crepitus or effusions noted.  No bony pathology or digital deformities noted.  Skin  normotropic skin with no porokeratosis noted bilaterally.  No signs of infections or ulcers noted.     Onychomycosis  Pain in right toes  Pain in left toes  Consent was obtained for treatment procedures.   Mechanical debridement of nails 1-5  bilaterally performed with a nail nipper.  Filed with dremel without incident.    Return office visit     3 months                 Told patient to return for periodic foot care and evaluation due to potential at risk complications.   Miking Usrey DPM  

## 2020-01-06 ENCOUNTER — Telehealth: Payer: Self-pay

## 2020-01-06 ENCOUNTER — Ambulatory Visit (INDEPENDENT_AMBULATORY_CARE_PROVIDER_SITE_OTHER): Payer: Medicare Other | Admitting: Orthopaedic Surgery

## 2020-01-06 DIAGNOSIS — M1711 Unilateral primary osteoarthritis, right knee: Secondary | ICD-10-CM

## 2020-01-06 DIAGNOSIS — M25561 Pain in right knee: Secondary | ICD-10-CM | POA: Diagnosis not present

## 2020-01-06 DIAGNOSIS — M25562 Pain in left knee: Secondary | ICD-10-CM | POA: Diagnosis not present

## 2020-01-06 DIAGNOSIS — G8929 Other chronic pain: Secondary | ICD-10-CM

## 2020-01-06 DIAGNOSIS — M1712 Unilateral primary osteoarthritis, left knee: Secondary | ICD-10-CM | POA: Diagnosis not present

## 2020-01-06 MED ORDER — METHYLPREDNISOLONE ACETATE 40 MG/ML IJ SUSP
40.0000 mg | INTRAMUSCULAR | Status: AC | PRN
  Administered 2020-01-06: 40 mg via INTRA_ARTICULAR

## 2020-01-06 MED ORDER — LIDOCAINE HCL 1 % IJ SOLN
3.0000 mL | INTRAMUSCULAR | Status: AC | PRN
  Administered 2020-01-06: 3 mL

## 2020-01-06 NOTE — Telephone Encounter (Signed)
Can we get her approved for bilateral Synvisc please

## 2020-01-06 NOTE — Progress Notes (Signed)
   Procedure Note  Patient: [name]             Date of Birth: [birth year]           MRN: [id number]             Visit Date: 01/06/2020  Procedures: Visit Diagnoses:  1. Primary osteoarthritis of right knee   2. Primary osteoarthritis of left knee   3. Chronic pain of left knee   4. Chronic pain of right knee     Large Joint Inj: R knee on 01/06/2020 2:12 PM Indications: diagnostic evaluation and pain Details: 22 G 1.5 in needle, superolateral approach  Arthrogram: No  Medications: 3 mL lidocaine 1 %; 40 mg methylPREDNISolone acetate 40 MG/ML Outcome: tolerated well, no immediate complications Procedure, treatment alternatives, risks and benefits explained, specific risks discussed. Consent was given by the patient. Immediately prior to procedure a time out was called to verify the correct patient, procedure, equipment, support staff and site/side marked as required. Patient was prepped and draped in the usual sterile fashion.   Large Joint Inj: L knee on 01/06/2020 2:12 PM Indications: diagnostic evaluation and pain Details: 22 G 1.5 in needle, superolateral approach  Arthrogram: No  Medications: 3 mL lidocaine 1 %; 40 mg methylPREDNISolone acetate 40 MG/ML Outcome: tolerated well, no immediate complications Procedure, treatment alternatives, risks and benefits explained, specific risks discussed. Consent was given by the patient. Immediately prior to procedure a time out was called to verify the correct patient, procedure, equipment, support staff and site/side marked as required. Patient was prepped and draped in the usual sterile fashion.    The patient comes in today requesting bilateral knee steroid injections.  She does arthritis in both her knees.  She has had hyaluronic acid before.  Is too early to provide his injections for least 1 more month.  Those have been better for her than steroids but she would like at least have steroids in her knees today to temporize her  symptoms while she waits for hyaluronic acid.  She is 86 years old and otherwise healthy.  She ambulates with a cane.  She is not interested in knee replacement surgery.  Examination both knees show patellofemoral crepitation as well as borderline effusion of both knees.  She has medial lateral joint line tenderness as well.  I did place steroids in both knees without difficulty.  We will order hyaluronic acid for both knees.  All questions and concerns were answered and addressed.

## 2020-01-07 ENCOUNTER — Telehealth: Payer: Self-pay

## 2020-01-07 NOTE — Telephone Encounter (Signed)
Submitted VOB, SynviscOne, bilateral knee. 

## 2020-01-07 NOTE — Telephone Encounter (Signed)
Noted. Next available gel injection would be after 01/09/2020.

## 2020-01-14 ENCOUNTER — Telehealth: Payer: Self-pay

## 2020-01-14 NOTE — Telephone Encounter (Signed)
Called and left a VM advising patient to call back to schedule an appointment with Dr. Ninfa Linden or Artis Delay for gel injection.  Approved, SynviscOne, bilateral knee. [place] Patient will be responsible for 20% OOP. No Co-pay No PA required

## 2020-02-03 ENCOUNTER — Ambulatory Visit (INDEPENDENT_AMBULATORY_CARE_PROVIDER_SITE_OTHER): Payer: Medicare Other | Admitting: Orthopaedic Surgery

## 2020-02-03 DIAGNOSIS — M1711 Unilateral primary osteoarthritis, right knee: Secondary | ICD-10-CM | POA: Diagnosis not present

## 2020-02-03 DIAGNOSIS — M1712 Unilateral primary osteoarthritis, left knee: Secondary | ICD-10-CM

## 2020-02-03 MED ORDER — HYLAN G-F 20 48 MG/6ML IX SOSY
48.0000 mg | PREFILLED_SYRINGE | INTRA_ARTICULAR | Status: AC | PRN
  Administered 2020-02-03: 48 mg via INTRA_ARTICULAR

## 2020-02-03 NOTE — Progress Notes (Signed)
   Procedure Note  Patient: [name]             Date of Birth: 08/11/1928           MRN: [id number]             Visit Date: 02/03/2020  Procedures: Visit Diagnoses:  1. Primary osteoarthritis of right knee   2. Primary osteoarthritis of left knee     Large Joint Inj: R knee on 02/03/2020 10:06 AM Indications: pain and diagnostic evaluation Details: 22 G 1.5 in needle, superolateral approach  Arthrogram: No  Medications: 48 mg Hylan 48 MG/6ML Outcome: tolerated well, no immediate complications Procedure, treatment alternatives, risks and benefits explained, specific risks discussed. Consent was given by the patient. Immediately prior to procedure a time out was called to verify the correct patient, procedure, equipment, support staff and site/side marked as required. Patient was prepped and draped in the usual sterile fashion.   Large Joint Inj: L knee on 02/03/2020 10:07 AM Indications: pain and diagnostic evaluation Details: 22 G 1.5 in needle, superolateral approach  Arthrogram: No  Medications: 48 mg Hylan 48 MG/6ML Outcome: tolerated well, no immediate complications Procedure, treatment alternatives, risks and benefits explained, specific risks discussed. Consent was given by the patient. Immediately prior to procedure a time out was called to verify the correct patient, procedure, equipment, support staff and site/side marked as required. Patient was prepped and draped in the usual sterile fashion.     The patient comes in today for bilateral knee Synvisc 1 injections with hyaluronic acid to treat the pain from osteoarthritis.  She has had these before and they have helped for her.  She is 86 years old and does ambulate with a cane.  She has tried and failed other conservative treatments first.  She has had no acute change in her medical status.  Both knees are painful throughout the arc of motion with known osteoarthritis.  I did place Synvisc 1 in both knees  without difficulty.  She knows to wait 6 months between these injections.  Follow-up is as needed.

## 2020-03-27 ENCOUNTER — Ambulatory Visit: Payer: Medicare Other | Admitting: Podiatry

## 2020-05-21 ENCOUNTER — Other Ambulatory Visit: Payer: Self-pay | Admitting: Internal Medicine

## 2020-05-22 LAB — CBC
HCT: 41.8 % (ref 35.0–45.0)
Hemoglobin: 14.3 g/dL (ref 11.7–15.5)
MCH: 32.1 pg (ref 27.0–33.0)
MCHC: 34.2 g/dL (ref 32.0–36.0)
MCV: 93.7 fL (ref 80.0–100.0)
MPV: 9.9 fL (ref 7.5–12.5)
Platelets: 227 10*3/uL (ref 140–400)
RBC: 4.46 10*6/uL (ref 3.80–5.10)
RDW: 12.7 % (ref 11.0–15.0)
WBC: 4.6 10*3/uL (ref 3.8–10.8)

## 2020-05-22 LAB — COMPLETE METABOLIC PANEL WITH GFR
AG Ratio: 1.4 (calc) (ref 1.0–2.5)
ALT: 7 U/L (ref 6–29)
AST: 12 U/L (ref 10–35)
Albumin: 3.7 g/dL (ref 3.6–5.1)
Alkaline phosphatase (APISO): 63 U/L (ref 37–153)
BUN/Creatinine Ratio: 16 (calc) (ref 6–22)
BUN: 15 mg/dL (ref 7–25)
CO2: 24 mmol/L (ref 20–32)
Calcium: 9.1 mg/dL (ref 8.6–10.4)
Chloride: 104 mmol/L (ref 98–110)
Creat: 0.92 mg/dL — ABNORMAL HIGH (ref 0.60–0.88)
GFR, Est African American: 64 mL/min/{1.73_m2} (ref >=60)
GFR, Est Non African American: 55 mL/min/{1.73_m2} — ABNORMAL LOW (ref >=60)
Globulin: 2.6 g/dL (calc) (ref 1.9–3.7)
Glucose, Bld: 101 mg/dL — ABNORMAL HIGH (ref 65–99)
Potassium: 4.2 mmol/L (ref 3.5–5.3)
Sodium: 139 mmol/L (ref 135–146)
Total Bilirubin: 0.8 mg/dL (ref 0.2–1.2)
Total Protein: 6.3 g/dL (ref 6.1–8.1)

## 2020-05-22 LAB — LIPID PANEL
Cholesterol: 166 mg/dL (ref <200)
HDL: 87 mg/dL (ref >=50)
LDL Cholesterol (Calc): 65 mg/dL (calc)
Non-HDL Cholesterol (Calc): 79 mg/dL (calc) (ref <130)
Total CHOL/HDL Ratio: 1.9 (calc) (ref <5.0)
Triglycerides: 62 mg/dL (ref <150)

## 2020-05-22 LAB — TSH: TSH: 0.81 mIU/L (ref 0.40–4.50)

## 2020-07-28 ENCOUNTER — Telehealth: Payer: Self-pay

## 2020-07-28 NOTE — Telephone Encounter (Signed)
Patient called she would like to get pre approval process started for gel injection, last injection received was 07/09/2019 call back:[phone]

## 2020-07-28 NOTE — Telephone Encounter (Signed)
Can you please advise for her? Thanks!

## 2020-07-30 NOTE — Telephone Encounter (Signed)
Noted  

## 2020-08-04 ENCOUNTER — Telehealth: Payer: Self-pay | Admitting: Orthopaedic Surgery

## 2020-08-04 NOTE — Telephone Encounter (Signed)
Patient called asking for a return call from April about her gel injections. She has questions. Please call patient at [phone].

## 2020-08-05 ENCOUNTER — Telehealth: Payer: Self-pay

## 2020-08-05 NOTE — Telephone Encounter (Signed)
Talked with patient concerning gel injection. Patient aware that she will get a call when approved.

## 2020-08-05 NOTE — Telephone Encounter (Signed)
VOB has been submitted for SynviscOne, bilateral knee. Pending BV.

## 2020-08-06 ENCOUNTER — Telehealth: Payer: Self-pay

## 2020-08-06 NOTE — Telephone Encounter (Signed)
Approved for SynviscOne, bilateral knee. [place] Patient will be responsible for 20% OOP. No Co-pay No PA required  Appt.08/10/2020 with Erskine Emery

## 2020-08-10 ENCOUNTER — Ambulatory Visit (INDEPENDENT_AMBULATORY_CARE_PROVIDER_SITE_OTHER): Payer: Medicare Other | Admitting: Physician Assistant

## 2020-08-10 ENCOUNTER — Encounter: Payer: Self-pay | Admitting: Physician Assistant

## 2020-08-10 DIAGNOSIS — M1712 Unilateral primary osteoarthritis, left knee: Secondary | ICD-10-CM | POA: Diagnosis not present

## 2020-08-10 DIAGNOSIS — M1711 Unilateral primary osteoarthritis, right knee: Secondary | ICD-10-CM | POA: Diagnosis not present

## 2020-08-10 DIAGNOSIS — M170 Bilateral primary osteoarthritis of knee: Secondary | ICD-10-CM | POA: Diagnosis not present

## 2020-08-10 MED ORDER — LIDOCAINE HCL 1 % IJ SOLN
0.5000 mL | INTRAMUSCULAR | Status: AC | PRN
  Administered 2020-08-10: .5 mL

## 2020-08-10 MED ORDER — HYLAN G-F 20 48 MG/6ML IX SOSY
48.0000 mg | PREFILLED_SYRINGE | INTRA_ARTICULAR | Status: AC | PRN
  Administered 2020-08-10: 48 mg via INTRA_ARTICULAR

## 2020-08-10 NOTE — Progress Notes (Signed)
       [name] is diagnosed with osteoarthritis of both knees.  Radiographs show evidence of joint space narrowing, osteophytes.  Her knee pain which interferes with functional and activities of daily living.   This patient has experienced inadequate response, adverse effects and/or intolerance with conservative treatments such as acetaminophen, NSAIDS, topical creams, physical therapy or regular exercise, knee bracing and/or weight loss. This patient is not scheduled to have a total knee replacement within 6 months of starting treatment with viscosupplementation.   Procedure Note  Patient: [name]             Date of Birth: 1928-05-26           MRN: [id number]             Visit Date: 08/10/2020  Procedures: Visit Diagnoses:  1. Primary osteoarthritis of right knee   2. Primary osteoarthritis of left knee     Large Joint Inj: bilateral knee on 08/10/2020 11:11 AM Indications: pain Details: 22 G 1.5 in needle, anterolateral approach  Arthrogram: No  Medications (Right): 0.5 mL lidocaine 1 %; 48 mg Hylan 48 MG/6ML Medications (Left): 0.5 mL lidocaine 1 %; 48 mg Hylan 48 MG/6ML Outcome: tolerated well, no immediate complications Procedure, treatment alternatives, risks and benefits explained, specific risks discussed. Consent was given by the patient. Immediately prior to procedure a time out was called to verify the correct patient, procedure, equipment, support staff and site/side marked as required. Patient was prepped and draped in the usual sterile fashion.     21: She understands to wait at least 6 months between supplemental injections.  She did also ask about her left hip.  She had pain in the groin.  In 2017 she did have some moderate arthritic changes of the hip.  Exam of the hip today it moves smoothly.  She has no tenderness over the trochanteric region.  She is asking about conservative treatment as she does not want wish to undergo any kind hip replacement like  she did on the right.  Feel that she could benefit from intra-articular injection under ultrasound of the left hip if she chooses to do so.

## 2020-09-07 ENCOUNTER — Telehealth: Payer: Self-pay | Admitting: Physician Assistant

## 2020-09-07 NOTE — Telephone Encounter (Signed)
Told her to try ice and Voltaren gel and let us know if this doesn't help

## 2020-09-07 NOTE — Telephone Encounter (Signed)
Patient called requesting a call back concerning left knee pains. She is asking for medical advice. Please call patient at [phone].

## 2020-10-12 ENCOUNTER — Ambulatory Visit (INDEPENDENT_AMBULATORY_CARE_PROVIDER_SITE_OTHER): Payer: Medicare Other | Admitting: Podiatry

## 2020-10-12 ENCOUNTER — Encounter: Payer: Self-pay | Admitting: Podiatry

## 2020-10-12 ENCOUNTER — Other Ambulatory Visit: Payer: Self-pay

## 2020-10-12 DIAGNOSIS — M79674 Pain in right toe(s): Secondary | ICD-10-CM

## 2020-10-12 DIAGNOSIS — M79675 Pain in left toe(s): Secondary | ICD-10-CM

## 2020-10-12 DIAGNOSIS — B351 Tinea unguium: Secondary | ICD-10-CM | POA: Diagnosis not present

## 2020-10-12 NOTE — Progress Notes (Signed)
This patient returns to my office for at risk foot care.  This patient requires this care by a professional since this patient will be at risk due to having neuropathy.  This patient is unable to cut nails herself since the patient cannot reach her nails.These nails are painful walking and wearing shoes.  This patient presents for at risk foot care today.  General Appearance  Alert, conversant and in no acute stress.  Vascular  Dorsalis pedis and posterior tibial  pulses are weakly  palpable  bilaterally.  Capillary return is within normal limits  bilaterally. Temperature is within normal limits  bilaterally.  Neurologic  Senn-Weinstein monofilament wire test within normal limits  bilaterally. Muscle power within normal limits bilaterally.  Nails Thick disfigured discolored nails with subungual debris  from hallux to fifth toes bilaterally. No evidence of bacterial infection or drainage bilaterally.  Orthopedic  No limitations of motion  feet .  No crepitus or effusions noted.  No bony pathology or digital deformities noted.  Skin  normotropic skin with no porokeratosis noted bilaterally.  No signs of infections or ulcers noted.     Onychomycosis  Pain in right toes  Pain in left toes  Consent was obtained for treatment procedures.   Mechanical debridement of nails 1-5  bilaterally performed with a nail nipper.  Filed with dremel without incident.    Return office visit   3 months                   Told patient to return for periodic foot care and evaluation due to potential at risk complications.   Gardiner Barefoot DPM

## 2021-01-05 ENCOUNTER — Encounter (HOSPITAL_BASED_OUTPATIENT_CLINIC_OR_DEPARTMENT_OTHER): Payer: Self-pay

## 2021-01-05 ENCOUNTER — Encounter (HOSPITAL_COMMUNITY): Payer: Self-pay | Admitting: Emergency Medicine

## 2021-01-05 ENCOUNTER — Emergency Department (HOSPITAL_BASED_OUTPATIENT_CLINIC_OR_DEPARTMENT_OTHER): Payer: Medicare Other

## 2021-01-05 ENCOUNTER — Inpatient Hospital Stay (HOSPITAL_BASED_OUTPATIENT_CLINIC_OR_DEPARTMENT_OTHER)
Admission: EM | Admit: 2021-01-05 | Discharge: 2021-01-08 | DRG: 065 | Disposition: A | Discharge status: Home / Self Care | Payer: Medicare Other | Attending: Internal Medicine | Admitting: Internal Medicine

## 2021-01-05 ENCOUNTER — Ambulatory Visit (HOSPITAL_COMMUNITY)
Admission: EM | Admit: 2021-01-05 | Discharge: 2021-01-05 | Disposition: A | Discharge status: Home / Self Care | Payer: Medicare Other

## 2021-01-05 ENCOUNTER — Ambulatory Visit (HOSPITAL_COMMUNITY)
Admission: EM | Admit: 2021-01-05 | Discharge: 2021-01-05 | Discharge status: Against Medical Advice | Payer: Medicare Other

## 2021-01-05 ENCOUNTER — Other Ambulatory Visit: Payer: Self-pay

## 2021-01-05 DIAGNOSIS — Z96641 Presence of right artificial hip joint: Secondary | ICD-10-CM | POA: Diagnosis present

## 2021-01-05 DIAGNOSIS — W19XXXA Unspecified fall, initial encounter: Secondary | ICD-10-CM

## 2021-01-05 DIAGNOSIS — E559 Vitamin D deficiency, unspecified: Secondary | ICD-10-CM | POA: Diagnosis present

## 2021-01-05 DIAGNOSIS — G9520 Unspecified cord compression: Secondary | ICD-10-CM | POA: Diagnosis present

## 2021-01-05 DIAGNOSIS — Z7902 Long term (current) use of antithrombotics/antiplatelets: Secondary | ICD-10-CM

## 2021-01-05 DIAGNOSIS — R29810 Facial weakness: Secondary | ICD-10-CM | POA: Diagnosis present

## 2021-01-05 DIAGNOSIS — Z79899 Other long term (current) drug therapy: Secondary | ICD-10-CM

## 2021-01-05 DIAGNOSIS — M1990 Unspecified osteoarthritis, unspecified site: Secondary | ICD-10-CM | POA: Diagnosis present

## 2021-01-05 DIAGNOSIS — M5021 Other cervical disc displacement,  high cervical region: Secondary | ICD-10-CM | POA: Diagnosis present

## 2021-01-05 DIAGNOSIS — E669 Obesity, unspecified: Secondary | ICD-10-CM | POA: Diagnosis present

## 2021-01-05 DIAGNOSIS — F17210 Nicotine dependence, cigarettes, uncomplicated: Secondary | ICD-10-CM | POA: Diagnosis present

## 2021-01-05 DIAGNOSIS — Z880 Allergy status to penicillin: Secondary | ICD-10-CM

## 2021-01-05 DIAGNOSIS — I452 Bifascicular block: Secondary | ICD-10-CM | POA: Diagnosis present

## 2021-01-05 DIAGNOSIS — Z20822 Contact with and (suspected) exposure to covid-19: Secondary | ICD-10-CM | POA: Diagnosis present

## 2021-01-05 DIAGNOSIS — I160 Hypertensive urgency: Secondary | ICD-10-CM

## 2021-01-05 DIAGNOSIS — Z823 Family history of stroke: Secondary | ICD-10-CM

## 2021-01-05 DIAGNOSIS — I639 Cerebral infarction, unspecified: Secondary | ICD-10-CM | POA: Clinically undetermined

## 2021-01-05 DIAGNOSIS — I10 Essential (primary) hypertension: Secondary | ICD-10-CM | POA: Diagnosis present

## 2021-01-05 DIAGNOSIS — I63511 Cerebral infarction due to unspecified occlusion or stenosis of right middle cerebral artery: Principal | ICD-10-CM | POA: Diagnosis present

## 2021-01-05 DIAGNOSIS — M4802 Spinal stenosis, cervical region: Secondary | ICD-10-CM | POA: Diagnosis present

## 2021-01-05 DIAGNOSIS — E785 Hyperlipidemia, unspecified: Secondary | ICD-10-CM | POA: Diagnosis present

## 2021-01-05 DIAGNOSIS — Z6832 Body mass index (BMI) 32.0-32.9, adult: Secondary | ICD-10-CM

## 2021-01-05 DIAGNOSIS — Z7982 Long term (current) use of aspirin: Secondary | ICD-10-CM

## 2021-01-05 DIAGNOSIS — Z841 Family history of disorders of kidney and ureter: Secondary | ICD-10-CM

## 2021-01-05 DIAGNOSIS — Z833 Family history of diabetes mellitus: Secondary | ICD-10-CM

## 2021-01-05 DIAGNOSIS — Z8042 Family history of malignant neoplasm of prostate: Secondary | ICD-10-CM

## 2021-01-05 DIAGNOSIS — R296 Repeated falls: Secondary | ICD-10-CM | POA: Diagnosis present

## 2021-01-05 DIAGNOSIS — J309 Allergic rhinitis, unspecified: Secondary | ICD-10-CM | POA: Diagnosis present

## 2021-01-05 DIAGNOSIS — Z885 Allergy status to narcotic agent status: Secondary | ICD-10-CM

## 2021-01-05 DIAGNOSIS — Z801 Family history of malignant neoplasm of trachea, bronchus and lung: Secondary | ICD-10-CM

## 2021-01-05 DIAGNOSIS — R531 Weakness: Secondary | ICD-10-CM

## 2021-01-05 DIAGNOSIS — R29705 NIHSS score 5: Secondary | ICD-10-CM | POA: Diagnosis present

## 2021-01-05 DIAGNOSIS — I69354 Hemiplegia and hemiparesis following cerebral infarction affecting left non-dominant side: Secondary | ICD-10-CM

## 2021-01-05 IMAGING — CT CT HEAD W/O CM
4 series · 16 of 47 positions shown, 18 images · non-contrast
Comparison: [DATE]

CLINICAL DATA: Recent falls today with headaches and neck pain,
initial encounter

EXAM:
CT HEAD WITHOUT CONTRAST
CT CERVICAL SPINE WITHOUT CONTRAST
TECHNIQUE: Multidetector CT imaging of the head and cervical spine was
performed following the standard protocol without intravenous
contrast. Multiplanar CT image reconstructions of the cervical spine
were also generated.

[Series 2: head bone · axial · 0.45mm/px · z∈[-142,-114]mm · 3 of 73 slices shown]
[im 8/73  bone]
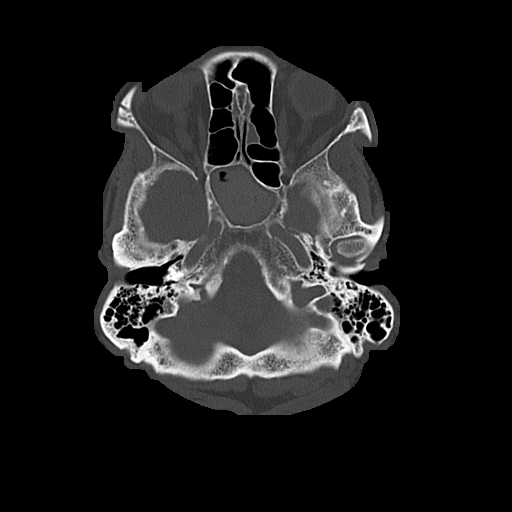
[im 15/73  bone]
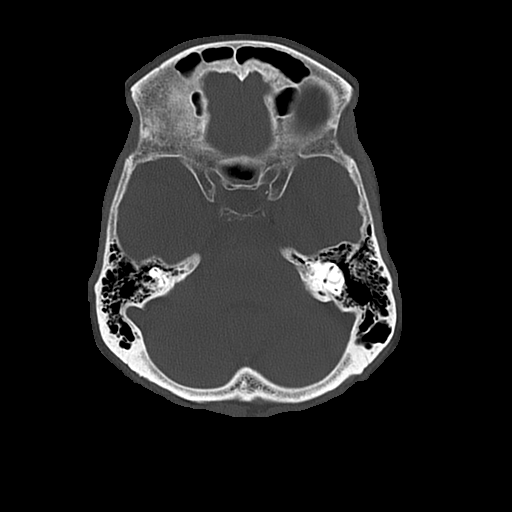
[im 22/73  bone]
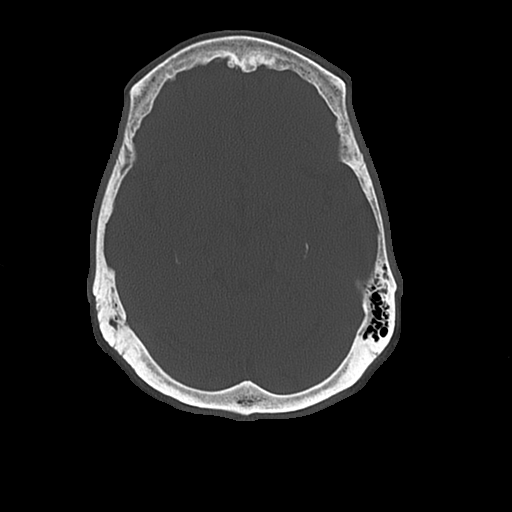

[Series 3: head wo · axial · 0.45mm/px · z∈[-141,-31]mm · 7 of 30 slices shown, 9 images]
[im 4/30  brain]
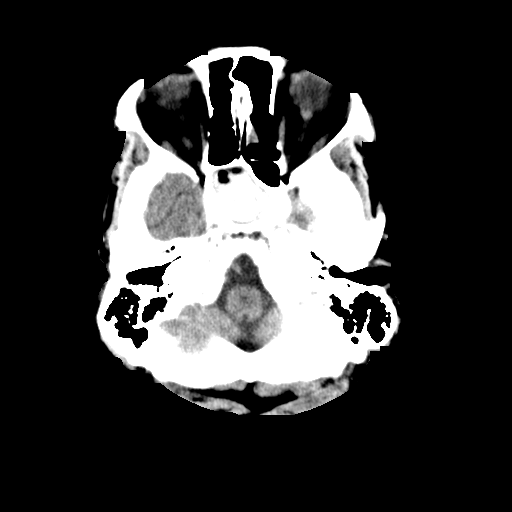
[im 4/30  bone]
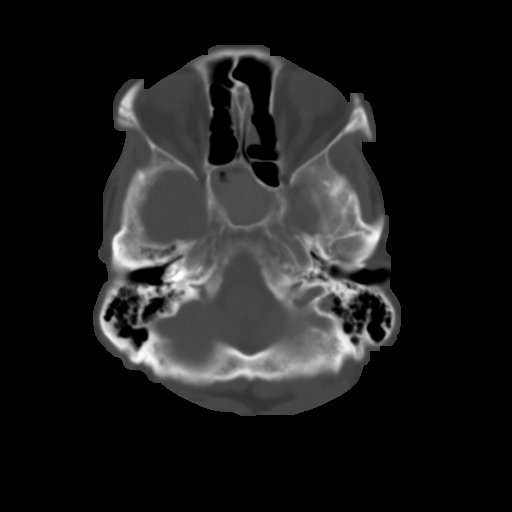
[im 8/30  brain]
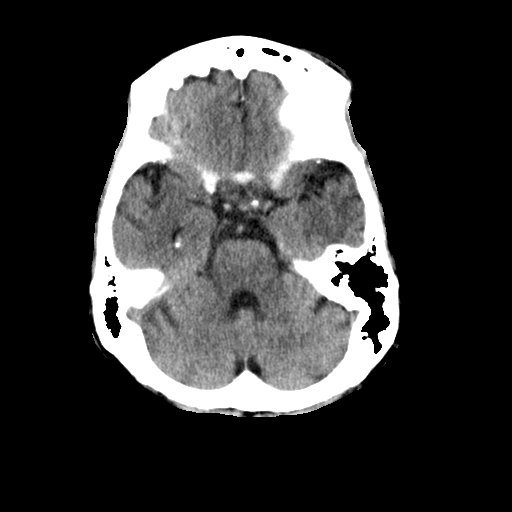
[im 11/30  brain]
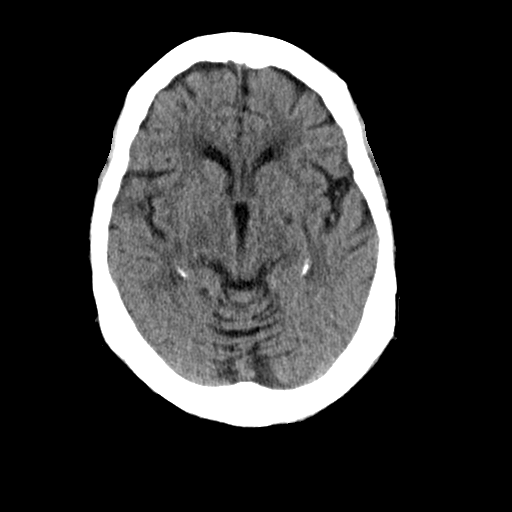
[im 15/30  brain]
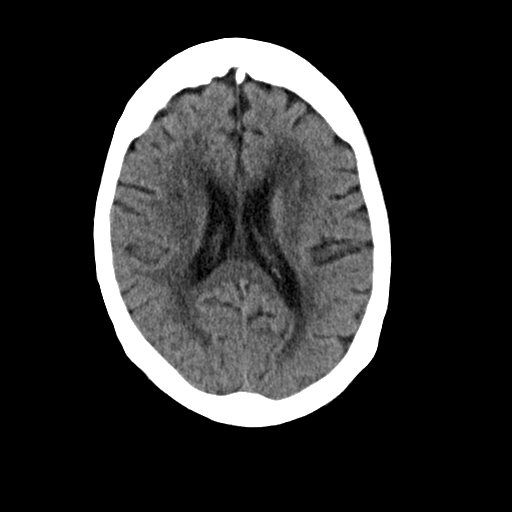
[im 19/30  brain]
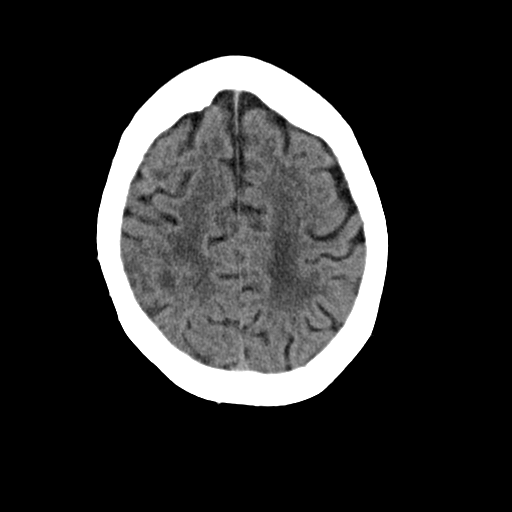
[im 19/30  bone]
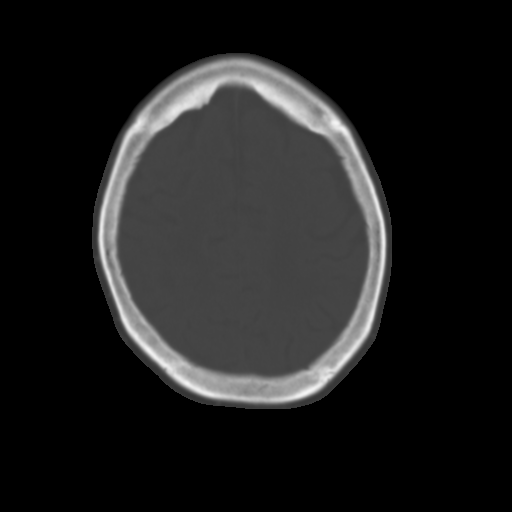
[im 22/30  brain]
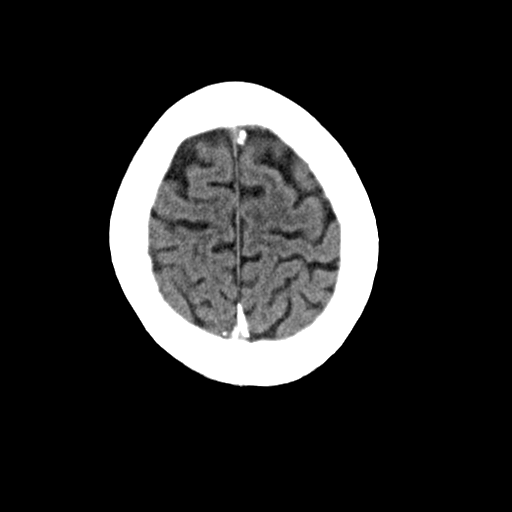
[im 26/30  brain]
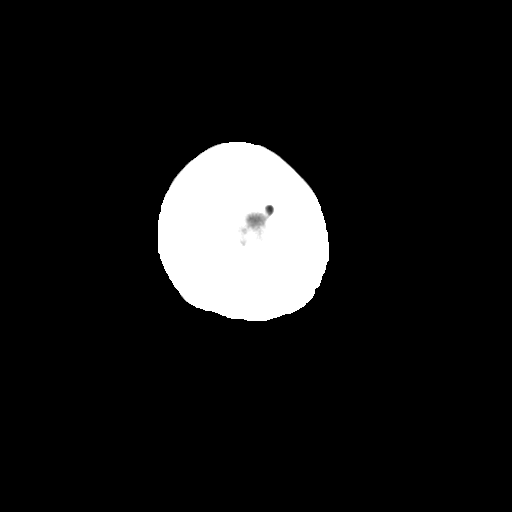

[Series 4: coronal soft · coronal · 0.30mm/px · 3 of 60 slices shown]
[im 20/60  brain]
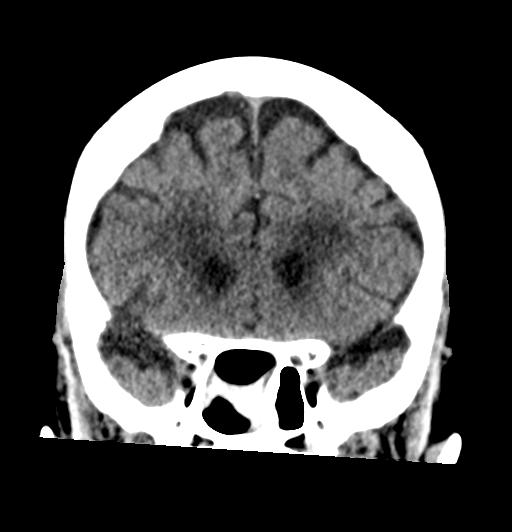
[im 27/60  brain]
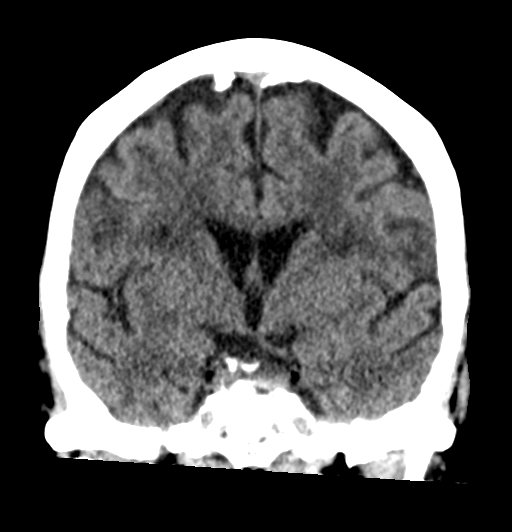
[im 33/60  brain]
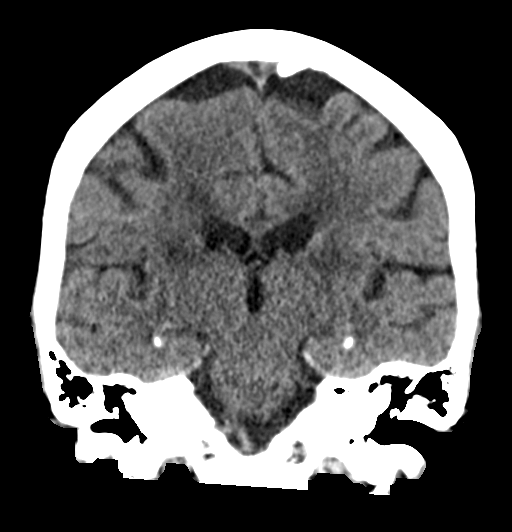

[Series 5: sagittal soft · sagittal · 0.32mm/px · 3 of 48 slices shown]
[im 16/48  brain]
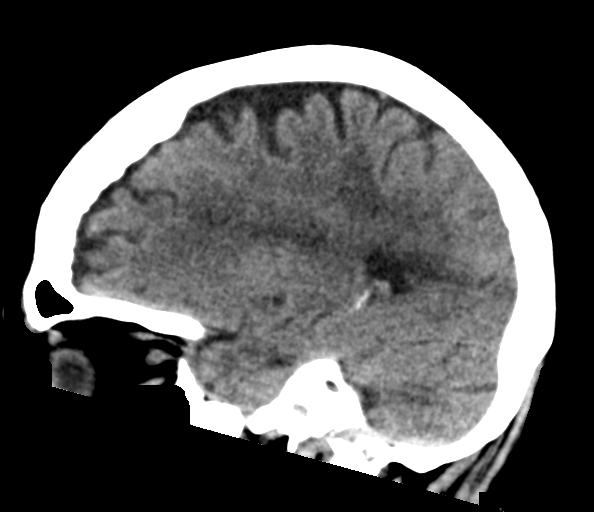
[im 24/48  brain]
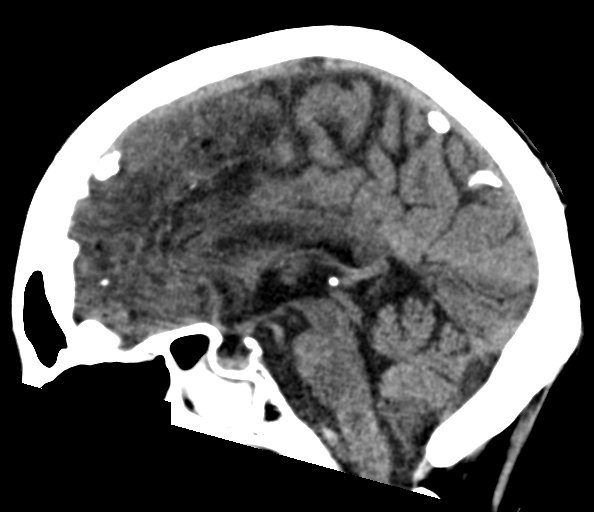
[im 32/48  brain]
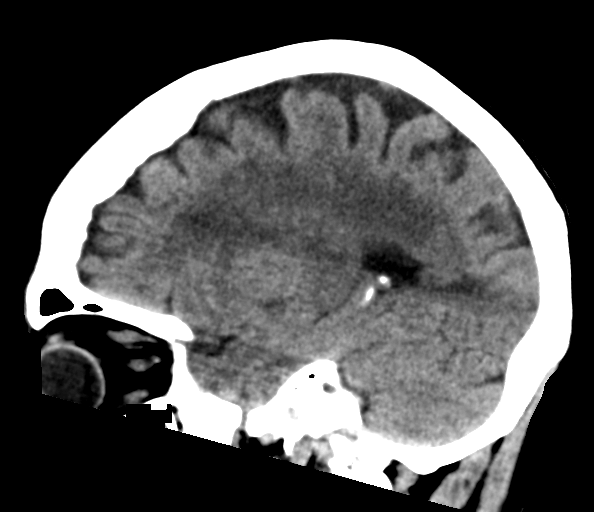

[16 of 47 positions shown; findings below may reference images not displayed]

FINDINGS: CT HEAD FINDINGS

Brain: No evidence of acute infarction, hemorrhage, hydrocephalus,
extra-axial collection or mass lesion/mass effect. Chronic atrophic
and ischemic changes are noted

Vascular: No hyperdense vessel or unexpected calcification.

Skull: Normal. Negative for fracture or focal lesion.

Sinuses/Orbits: No acute finding.

Other: None.

CT CERVICAL SPINE FINDINGS

Alignment: Stable straightening of the normal cervical lordosis.

Skull base and vertebrae: 7 cervical segments are well visualized.
Vertebral body height is well maintained. Osteophytic changes are
noted predominately from C5-C7. No acute fracture or acute facet
abnormality is noted. Multilevel facet hypertrophic changes are
seen.

Soft tissues and spinal canal: Surrounding soft tissue structures
are well visualized. No acute abnormality is noted. There are 2
adjacent hypodense lesions in the left lobe of the thyroid. The
largest of these measures 2.0 cm.

Upper chest: Visualized lung apices are within normal limits.

Other: None
IMPRESSION: CT of the head: Chronic atrophic and ischemic changes. No acute
abnormality noted.

CT of the cervical spine: Multilevel degenerative change.

Hypodense thyroid nodules in the left lobe of the liver as
described. No follow-up recommended unless clinically warranted
(ref: [HOSPITAL]. [DATE]): 143-50).

## 2021-01-05 IMAGING — CT CT CERVICAL SPINE W/O CM
3 of 4 series · 11 of 33 positions shown, 13 images · non-contrast
Comparison: [DATE]

CLINICAL DATA: Recent falls today with headaches and neck pain,
initial encounter

EXAM:
CT HEAD WITHOUT CONTRAST
CT CERVICAL SPINE WITHOUT CONTRAST
TECHNIQUE: Multidetector CT imaging of the head and cervical spine was
performed following the standard protocol without intravenous
contrast. Multiplanar CT image reconstructions of the cervical spine
were also generated.

[Series 5: cor bone · coronal · 0.28mm/px · 3 of 50 slices shown]
[im 10/50  bone]
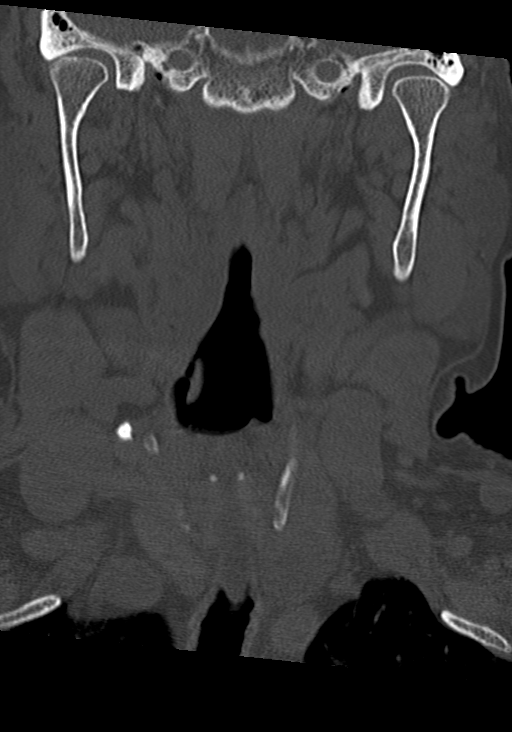
[im 20/50  bone]
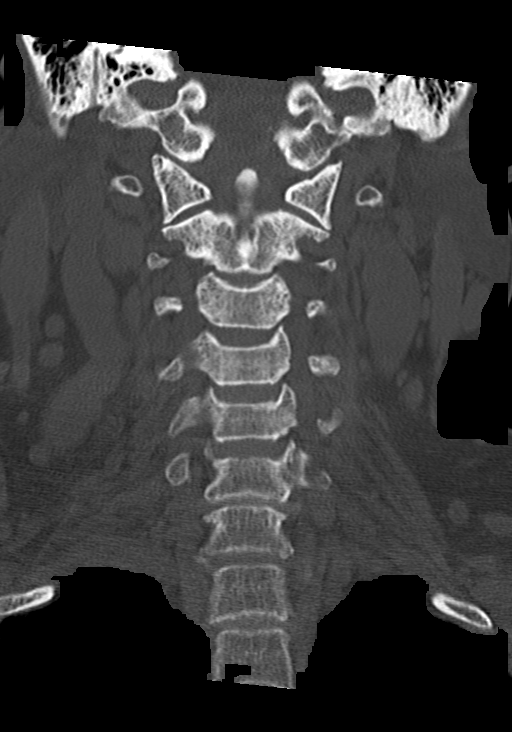
[im 30/50  bone]
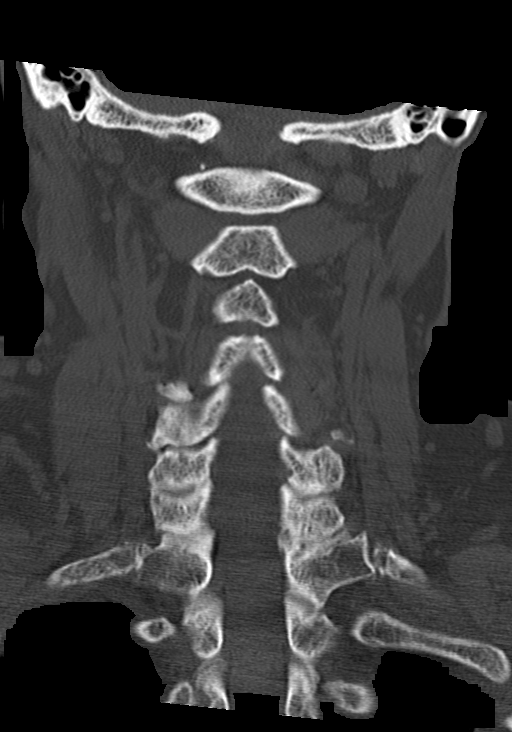

[Series 6: sag bone · sagittal · 0.23mm/px · 5 of 43 slices shown, 6 images]
[im 15/43  bone]
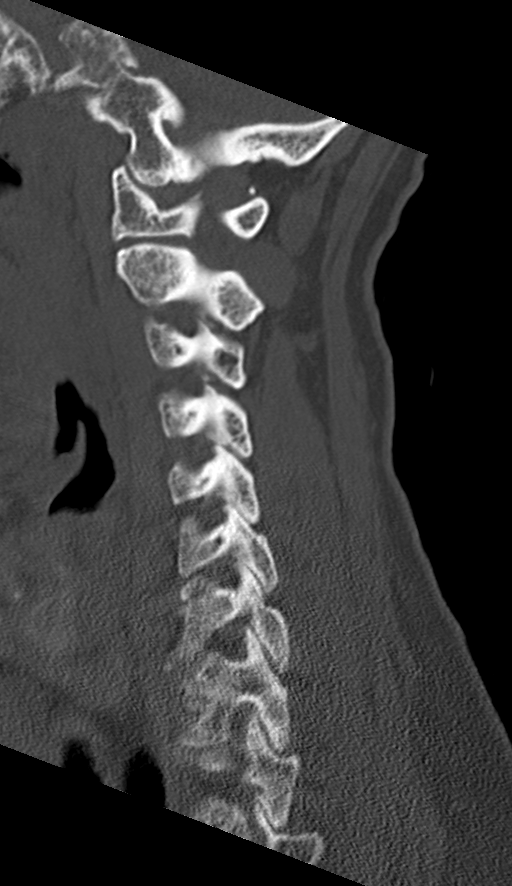
[im 18/43  bone]
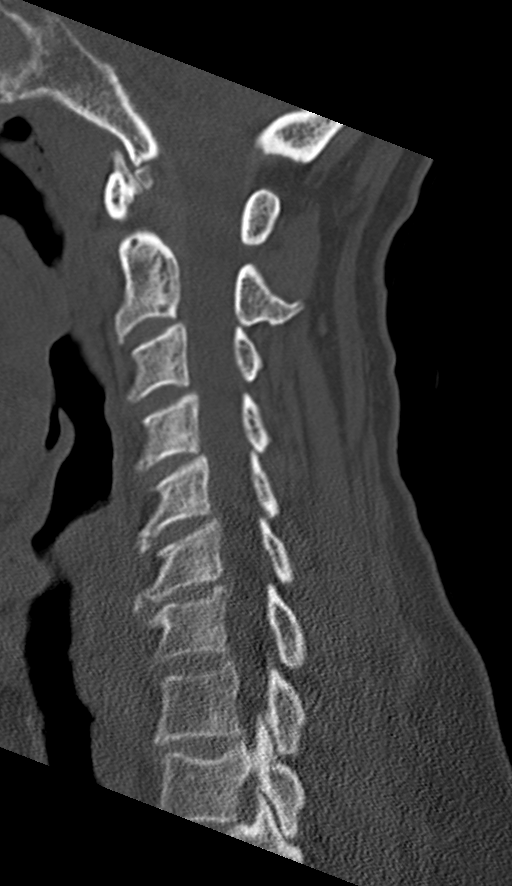
[im 22/43  soft-tissue]
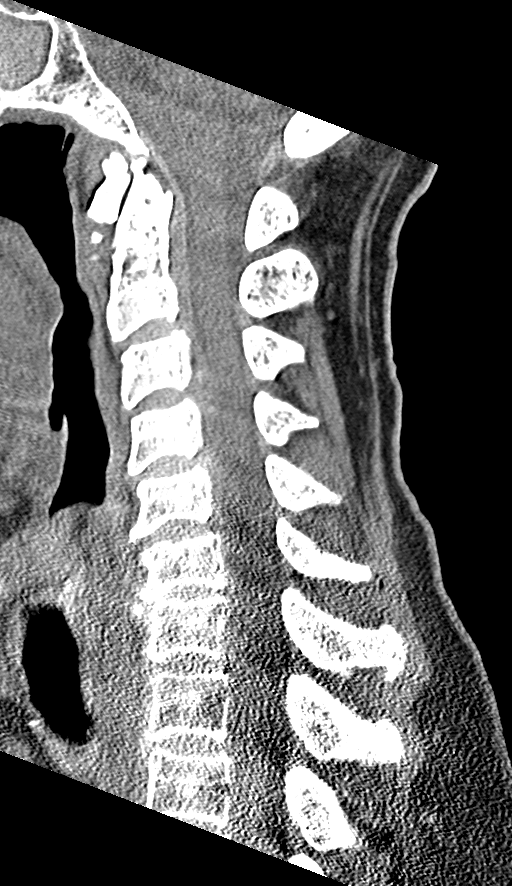
[im 22/43  bone]
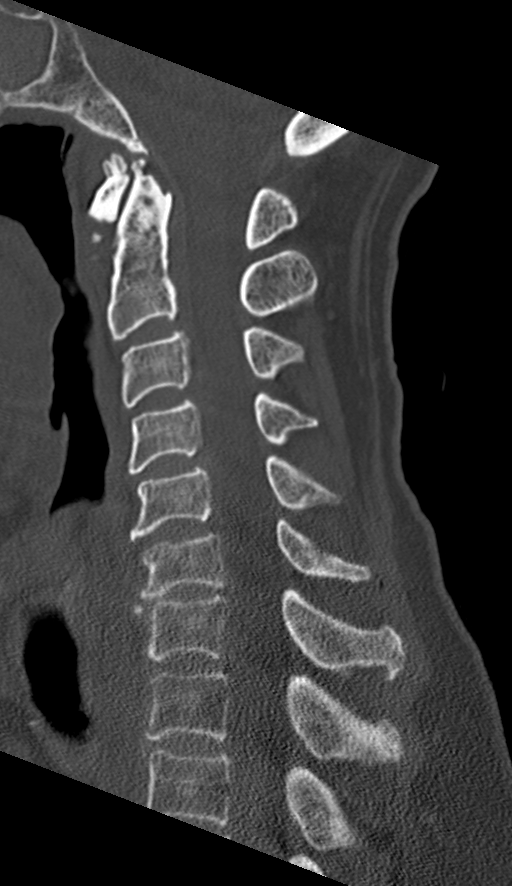
[im 25/43  bone]
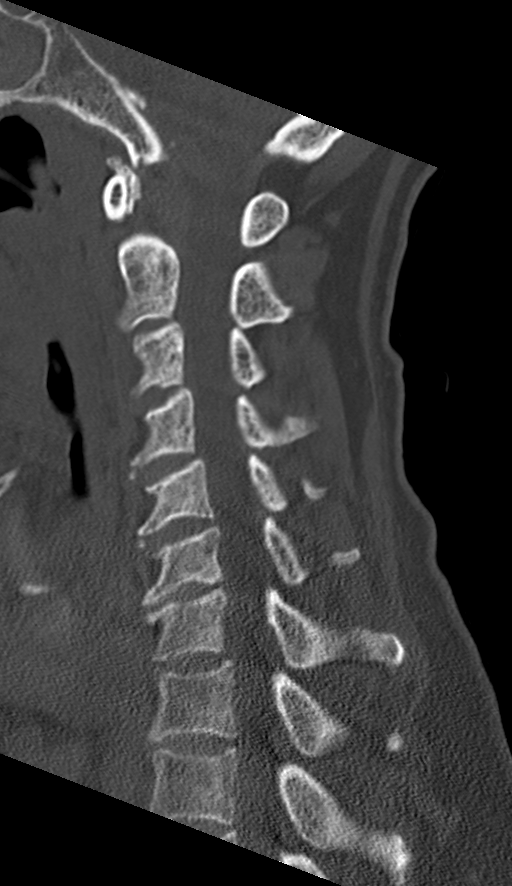
[im 29/43  bone]
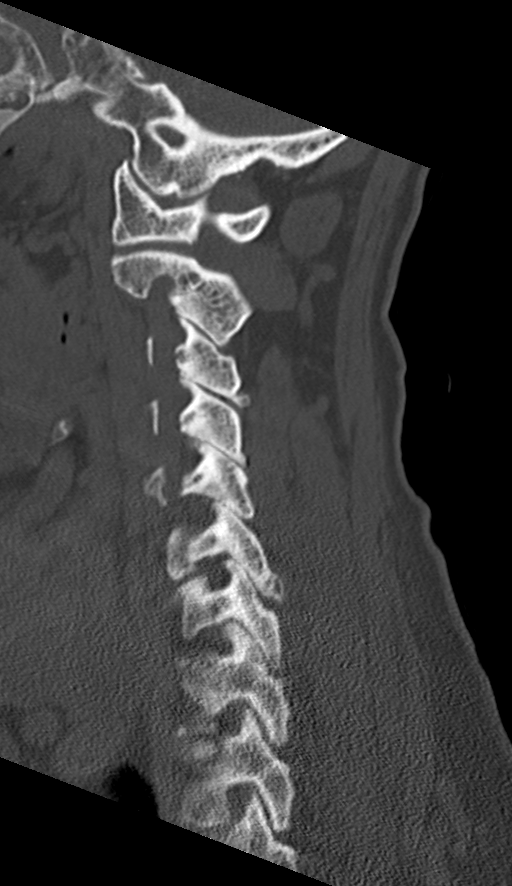

[Series 7: orthogonal axials · axial · 0.21mm/px · z∈[-283,-188]mm · 3 of 86 slices shown, 4 images]
[im 15/86  soft-tissue]
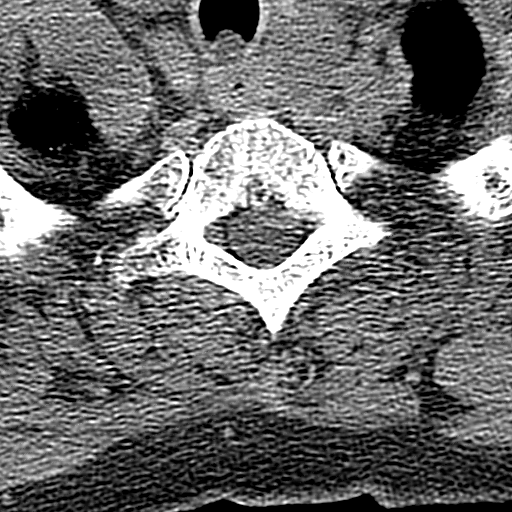
[im 15/86  bone]
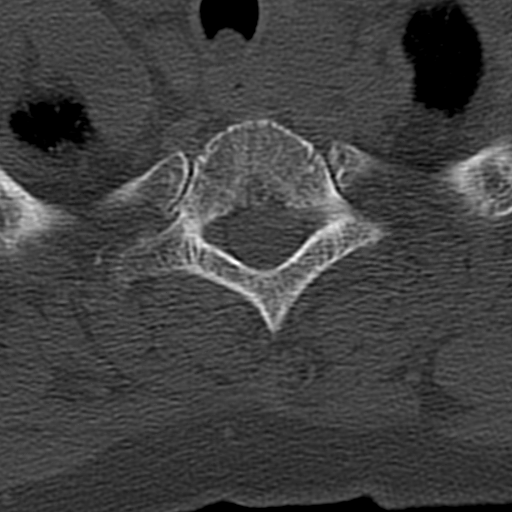
[im 43/86  bone]
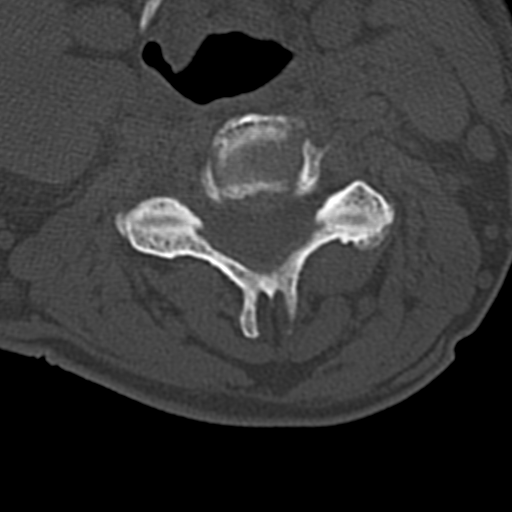
[im 71/86  bone]
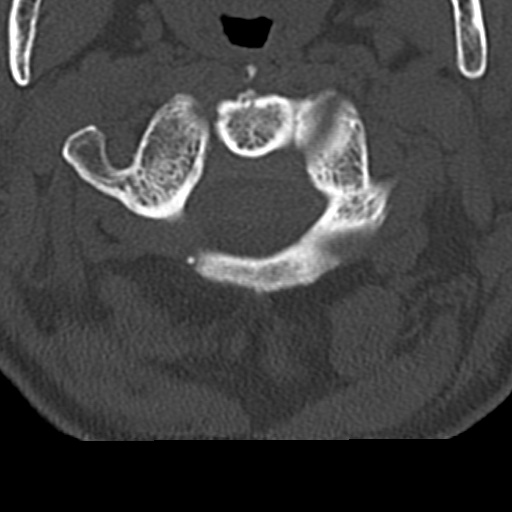

[11 of 33 positions shown; findings below may reference images not displayed]

FINDINGS: CT HEAD FINDINGS

Brain: No evidence of acute infarction, hemorrhage, hydrocephalus,
extra-axial collection or mass lesion/mass effect. Chronic atrophic
and ischemic changes are noted

Vascular: No hyperdense vessel or unexpected calcification.

Skull: Normal. Negative for fracture or focal lesion.

Sinuses/Orbits: No acute finding.

Other: None.

CT CERVICAL SPINE FINDINGS

Alignment: Stable straightening of the normal cervical lordosis.

Skull base and vertebrae: 7 cervical segments are well visualized.
Vertebral body height is well maintained. Osteophytic changes are
noted predominately from C5-C7. No acute fracture or acute facet
abnormality is noted. Multilevel facet hypertrophic changes are
seen.

Soft tissues and spinal canal: Surrounding soft tissue structures
are well visualized. No acute abnormality is noted. There are 2
adjacent hypodense lesions in the left lobe of the thyroid. The
largest of these measures 2.0 cm.

Upper chest: Visualized lung apices are within normal limits.

Other: None
IMPRESSION: CT of the head: Chronic atrophic and ischemic changes. No acute
abnormality noted.

CT of the cervical spine: Multilevel degenerative change.

Hypodense thyroid nodules in the left lobe of the liver as
described. No follow-up recommended unless clinically warranted
(ref: [HOSPITAL]. [DATE]): 143-50).

## 2021-01-05 NOTE — ED Triage Notes (Signed)
Pt reports when getting out of bed this morning she fell. Then later after leaving the bathroom to get dressed, fell again. Pt unsure why she fell. Denies LOC or taking blood thinners. On second fall hit left side of head on drawer. Pt also reports since yesterday bilat hand weakness causing her to drop things like her cane, cup of water.

## 2021-01-05 NOTE — ED Notes (Signed)
Patient is being discharged from the Urgent Care and sent to the Emergency Department via POV . Per Apolonio Schneiders, [place], patient is in need of higher level of care due to increased weakness and hypertension emergency. Patient is aware and verbalizes understanding of plan of care.  Vitals:   01/05/21 1755 01/05/21 1758  BP: (!) 207/70 (!) 189/79  Pulse: 64   Resp: 19   Temp:  98.7 F (37.1 C)  SpO2: 97%

## 2021-01-05 NOTE — Discharge Instructions (Signed)
[place] at [facility] Address: [address], [place], [place] [address] Phone: [phone]

## 2021-01-05 NOTE — ED Triage Notes (Signed)
Pt states she fell twice today when she tried to  get off the bed. It happened 0900 and 1100 today. Pt also endorses numbness  on both hand since last night.    GCS. 15. Pt was sent here from [facility]

## 2021-01-05 NOTE — ED Notes (Signed)
Eliezer Bottom, PA aware of patient complaint. At bedside with patient now.

## 2021-01-06 ENCOUNTER — Emergency Department (HOSPITAL_BASED_OUTPATIENT_CLINIC_OR_DEPARTMENT_OTHER): Payer: Medicare Other

## 2021-01-06 ENCOUNTER — Encounter (HOSPITAL_BASED_OUTPATIENT_CLINIC_OR_DEPARTMENT_OTHER): Payer: Self-pay | Admitting: Radiology

## 2021-01-06 ENCOUNTER — Observation Stay (HOSPITAL_COMMUNITY): Payer: Medicare Other

## 2021-01-06 ENCOUNTER — Other Ambulatory Visit: Payer: Self-pay

## 2021-01-06 DIAGNOSIS — I639 Cerebral infarction, unspecified: Secondary | ICD-10-CM

## 2021-01-06 DIAGNOSIS — M1990 Unspecified osteoarthritis, unspecified site: Secondary | ICD-10-CM | POA: Diagnosis not present

## 2021-01-06 DIAGNOSIS — I63411 Cerebral infarction due to embolism of right middle cerebral artery: Secondary | ICD-10-CM

## 2021-01-06 DIAGNOSIS — I63511 Cerebral infarction due to unspecified occlusion or stenosis of right middle cerebral artery: Secondary | ICD-10-CM | POA: Diagnosis not present

## 2021-01-06 DIAGNOSIS — I10 Essential (primary) hypertension: Secondary | ICD-10-CM

## 2021-01-06 LAB — CBC WITH DIFFERENTIAL/PLATELET
Abs Immature Granulocytes: 0.02 10*3/uL (ref 0.00–0.07)
Basophils Absolute: 0 10*3/uL (ref 0.0–0.1)
Basophils Relative: 0 %
Eosinophils Absolute: 0.2 10*3/uL (ref 0.0–0.5)
Eosinophils Relative: 3 %
HCT: 40.8 % (ref 36.0–46.0)
Hemoglobin: 13.4 g/dL (ref 12.0–15.0)
Immature Granulocytes: 0 %
Lymphocytes Relative: 32 %
Lymphs Abs: 1.9 10*3/uL (ref 0.7–4.0)
MCH: 31.6 pg (ref 26.0–34.0)
MCHC: 32.8 g/dL (ref 30.0–36.0)
MCV: 96.2 fL (ref 80.0–100.0)
Monocytes Absolute: 0.6 10*3/uL (ref 0.1–1.0)
Monocytes Relative: 9 %
Neutro Abs: 3.3 10*3/uL (ref 1.7–7.7)
Neutrophils Relative %: 56 %
Platelets: 217 10*3/uL (ref 150–400)
RBC: 4.24 MIL/uL (ref 3.87–5.11)
RDW: 14.1 % (ref 11.5–15.5)
WBC: 6 10*3/uL (ref 4.0–10.5)
nRBC: 0 % (ref 0.0–0.2)

## 2021-01-06 LAB — RAPID URINE DRUG SCREEN, HOSP PERFORMED
Amphetamines: NOT DETECTED
Barbiturates: NOT DETECTED
Benzodiazepines: NOT DETECTED
Cocaine: NOT DETECTED
Opiates: NOT DETECTED
Tetrahydrocannabinol: NOT DETECTED

## 2021-01-06 LAB — COMPREHENSIVE METABOLIC PANEL
ALT: 8 U/L (ref 0–44)
AST: 11 U/L — ABNORMAL LOW (ref 15–41)
Albumin: 3.9 g/dL (ref 3.5–5.0)
Alkaline Phosphatase: 52 U/L (ref 38–126)
Anion gap: 8 (ref 5–15)
BUN: 9 mg/dL (ref 8–23)
CO2: 25 mmol/L (ref 22–32)
Calcium: 9.3 mg/dL (ref 8.9–10.3)
Chloride: 106 mmol/L (ref 98–111)
Creatinine, Ser: 0.85 mg/dL (ref 0.44–1.00)
GFR, Estimated: >60 mL/min (ref >60)
Glucose, Bld: 123 mg/dL — ABNORMAL HIGH (ref 70–99)
Potassium: 3.6 mmol/L (ref 3.5–5.1)
Sodium: 139 mmol/L (ref 135–145)
Total Bilirubin: 0.9 mg/dL (ref 0.3–1.2)
Total Protein: 6.6 g/dL (ref 6.5–8.1)

## 2021-01-06 LAB — PROTIME-INR
INR: 1.3 — ABNORMAL HIGH (ref 0.8–1.2)
Prothrombin Time: 16.3 seconds — ABNORMAL HIGH (ref 11.4–15.2)

## 2021-01-06 LAB — URINALYSIS, ROUTINE W REFLEX MICROSCOPIC
Bilirubin Urine: NEGATIVE
Glucose, UA: NEGATIVE mg/dL
Hgb urine dipstick: NEGATIVE
Leukocytes,Ua: NEGATIVE
Nitrite: NEGATIVE
Specific Gravity, Urine: 1.023 (ref 1.005–1.030)
pH: 5.5 (ref 5.0–8.0)

## 2021-01-06 LAB — RESP PANEL BY RT-PCR (FLU A&B, COVID) ARPGX2
Influenza A by PCR: NEGATIVE
Influenza B by PCR: NEGATIVE
SARS Coronavirus 2 by RT PCR: NEGATIVE

## 2021-01-06 LAB — APTT: aPTT: 29 seconds (ref 24–36)

## 2021-01-06 LAB — ETHANOL: Alcohol, Ethyl (B): <10 mg/dL (ref <10)

## 2021-01-06 IMAGING — CT CT ANGIO HEAD
3 of 7 series · 10 of 36 positions shown · IV contrast (APPLIED)
Comparison: Head CT from [DATE].

CLINICAL DATA: Initial evaluation for neuro deficit, stroke
suspected.

EXAM:
CT ANGIOGRAPHY HEAD AND NECK
TECHNIQUE: Multidetector CT imaging of the head and neck was performed using
the standard protocol during bolus administration of intravenous
contrast. Multiplanar CT image reconstructions and MIPs were
obtained to evaluate the vascular anatomy. Carotid stenosis
measurements (when applicable) are obtained utilizing NASCET
criteria, using the distal internal carotid diameter as the
denominator.
CONTRAST:  75mL OMNIPAQUE IOHEXOL 350 MG/ML SOLN

[Series 4: cta head · axial · 0.57mm/px · z∈[-89,+17]mm · 2 of 159 slices shown]
[im 53/159  soft-tissue]
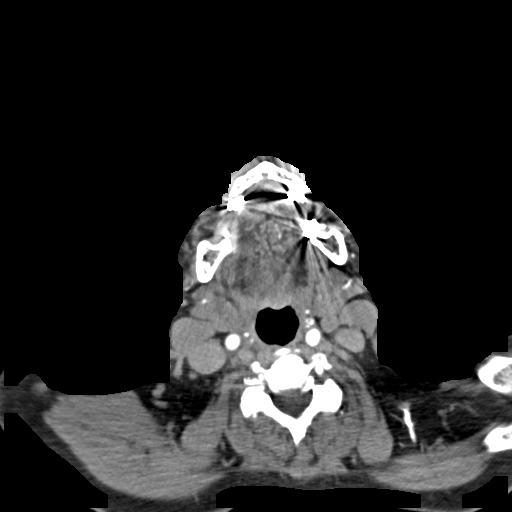
[im 106/159  soft-tissue]
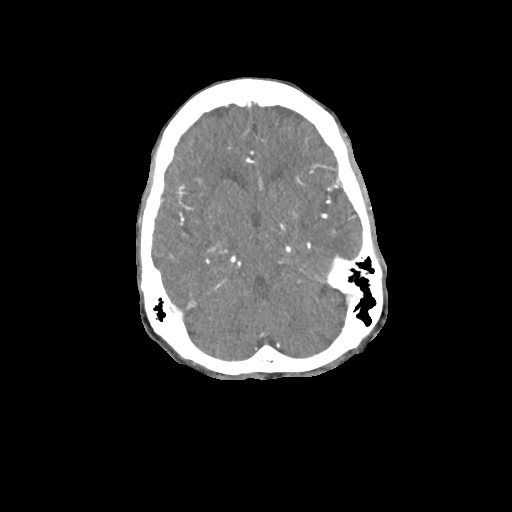

[Series 6: ax thin · axial · 0.46mm/px · z∈[-230,+0]mm · 6 of 350 slices shown]
[im 50/350  soft-tissue]
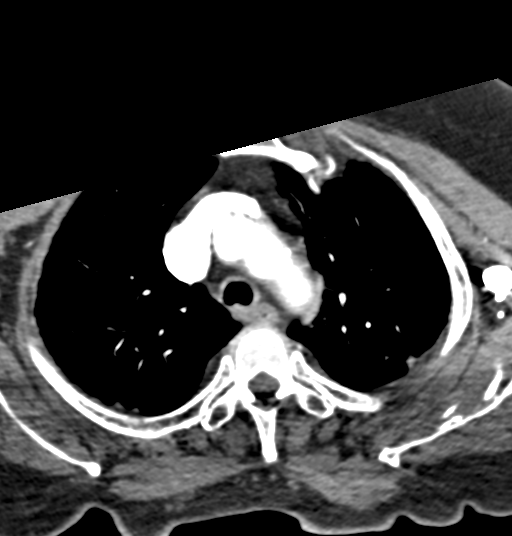
[im 100/350  bone]
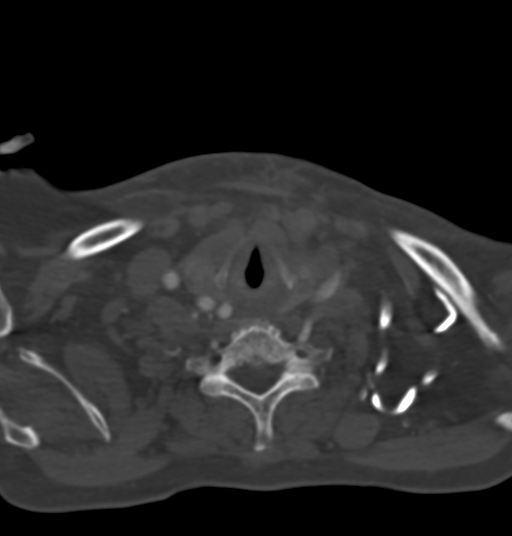
[im 150/350  soft-tissue]
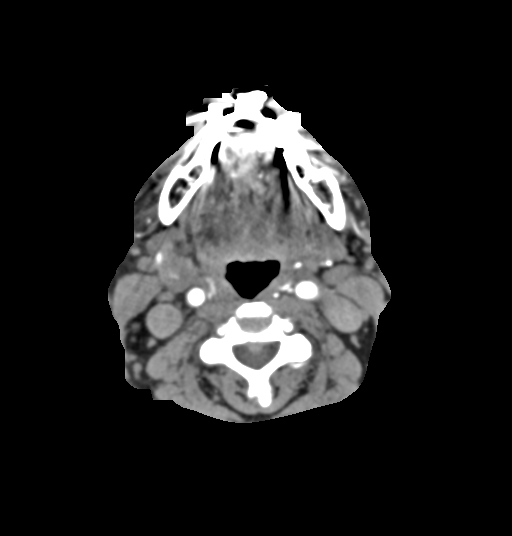
[im 200/350  bone]
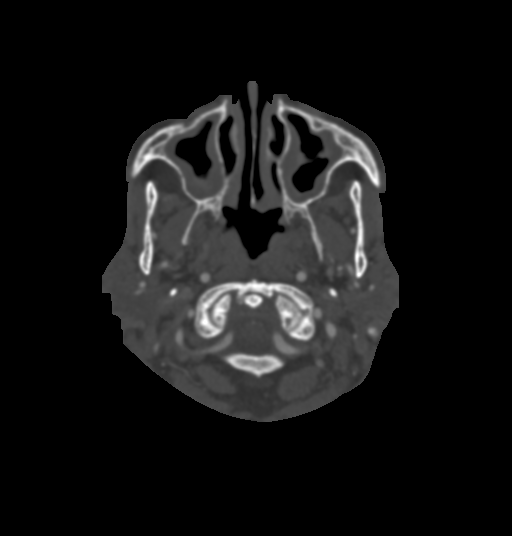
[im 250/350  soft-tissue]
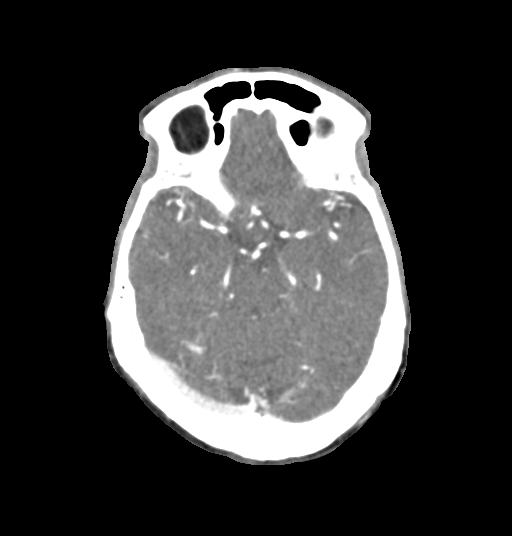
[im 300/350  bone]
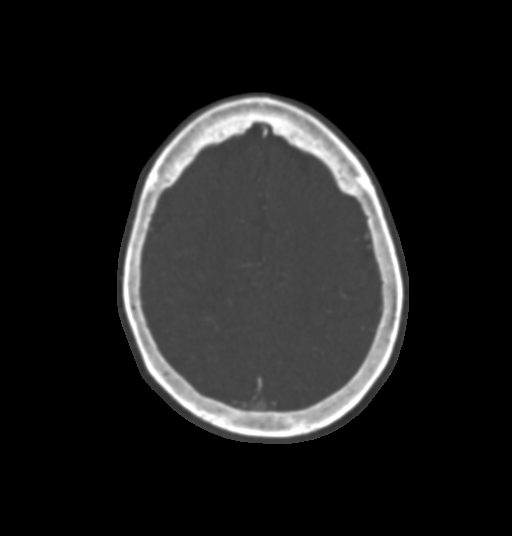

[Series 8: sag thin · sagittal · 0.42mm/px · 2 of 231 slices shown]
[im 67/231  soft-tissue]
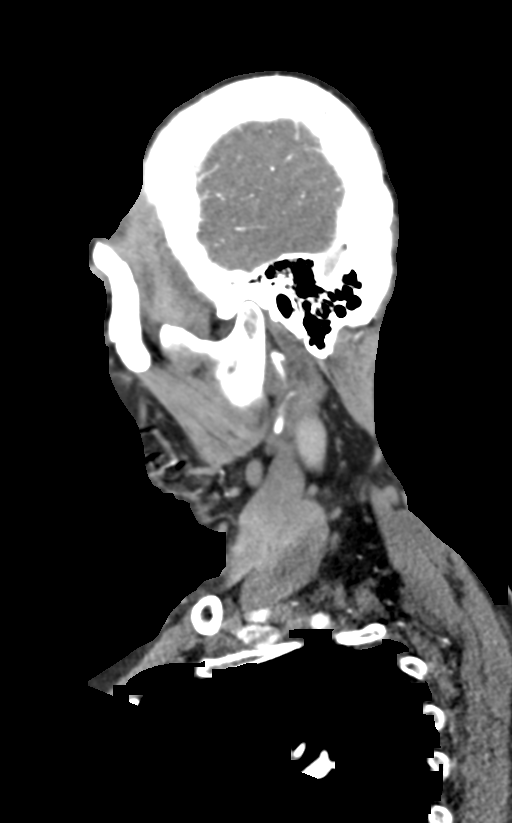
[im 165/231  soft-tissue]
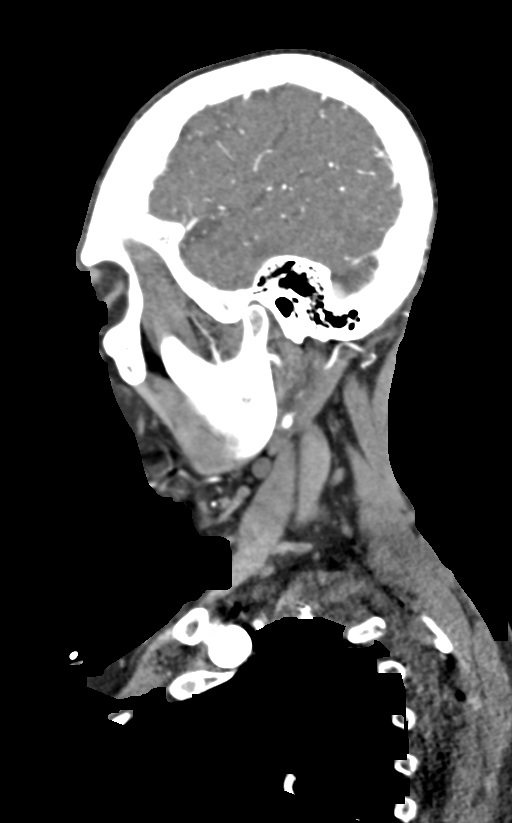

[10 of 36 positions shown; findings below may reference images not displayed]

FINDINGS: CTA NECK FINDINGS

Aortic arch: Visualized aortic arch normal in caliber with normal
branch pattern. Mild for age atheromatous change about the aortic
arch. No stenosis about the origin of the great vessels.

Right carotid system: Right CCA tortuous proximally but is widely
patent to the bifurcation. No significant atheromatous narrowing
about the right carotid bulb. Right ICA widely patent without
stenosis dissection or occlusion.

Left carotid system: Left CCA tortuous proximally but is widely
patent to the bifurcation. No significant atheromatous narrowing
about the left carotid bulb. Left ICA widely patent distally without
stenosis, dissection or occlusion.

Vertebral arteries: Both vertebral arteries arise from the
subclavian arteries. Left vertebral artery slightly dominant. No
proximal subclavian artery stenosis. Vertebral arteries patent
without stenosis dissection or occlusion.

Skeleton: No visible acute osseous finding. No discrete or worrisome
osseous lesions.

Other neck: No other acute soft tissue abnormality within the neck.
Few hypodense left thyroid nodules noted, largest of which measures
2.1 cm. In the setting of significant comorbidities or limited life
expectancy, no follow-up recommended (ref: [HOSPITAL]. [DATE]): 143-50).No other mass or adenopathy. Chronic paranasal
sinus disease noted.

Upper chest: Visualized upper chest demonstrates no acute finding.

Review of the MIP images confirms the above findings

CTA HEAD FINDINGS

Anterior circulation: Petrous segments patent bilaterally. Mild for
age atheromatous change within the carotid siphons without stenosis.
A1 segments widely patent. Normal anterior communicating artery
complex. Anterior cerebral arteries patent to their distal aspects
without stenosis.

Left M1 segment widely patent. Normal left MCA bifurcation. Distal
left MCA branches widely patent and well perfused.

Right M1 segment patent proximally. There is a severe high-grade
stenosis at the distal right M1 segment/right MCA bifurcation
(series 9, image 20). This is just beyond the takeoff of a small
anterior temporal branch. Right MCA branches patent distally.

Posterior circulation: Both V4 segments patent to the
vertebrobasilar junction without stenosis. Left PICA origin patent.
Right PICA not seen. Basilar patent to its distal aspect without
stenosis. Superior cerebral arteries patent bilaterally. Left PCA
supplied via the basilar. Right PCA supplied via the basilar as well
as a robust right posterior communicating artery. Both PCAs patent
to their distal aspects without stenosis.

Venous sinuses: Grossly patent allowing for timing the contrast
bolus.

Anatomic variants: None significant.  No aneurysm.

Review of the MIP images confirms the above findings
IMPRESSION: 1. Negative CTA for large vessel occlusion.
2. Severe high-grade stenosis at the distal right M1 segment/right
MCA bifurcation.
3. Relatively minor atheromatous disease for patient age elsewhere
about the major arterial vasculature of the head and neck. No other
hemodynamically significant or correctable stenosis.
4. Chronic paranasal sinusitis.

## 2021-01-06 IMAGING — MR MR HEAD W/O CM
6 of 11 series · 24 of 48 positions shown · non-contrast
Comparison: CT studies same day.

CLINICAL DATA: Neuro deficit, acute, stroke suspected.

EXAM:
MRI HEAD WITHOUT CONTRAST
TECHNIQUE: Multiplanar, multiecho pulse sequences of the brain and surrounding
structures were obtained without intravenous contrast.

[Series 2: DWI · axial · 3.0mm · 0.94mm/px · z∈[-103,+41]mm · 7 of 104 slices shown (1 of 2)]
[im 1/104]
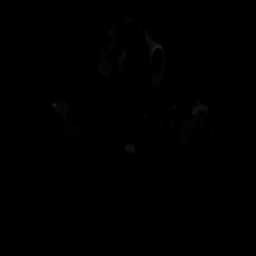
[im 18/104]
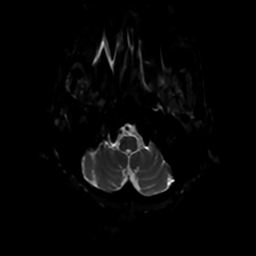
[im 35/104]
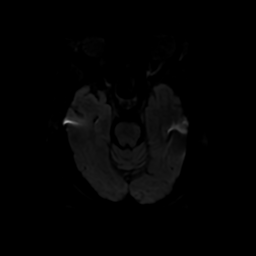
[im 52/104]
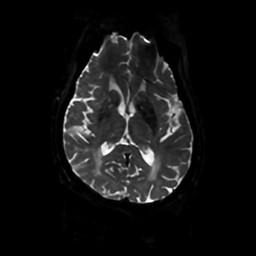
[im 69/104]
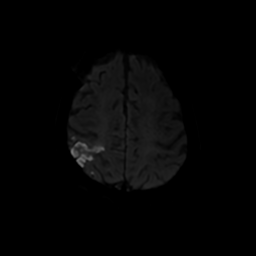
[im 86/104]
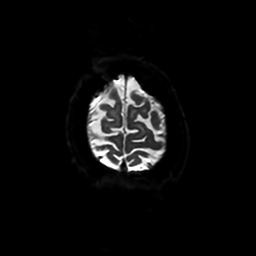
[im 104/104]
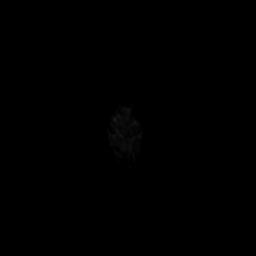

[Series 3: DWI · coronal · 4.0mm · 0.94mm/px · 5 of 70 slices shown (2 of 2)]
[im 1/70]
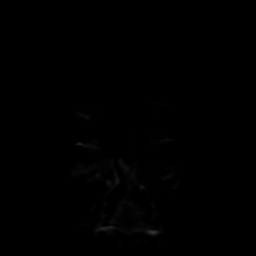
[im 18/70]
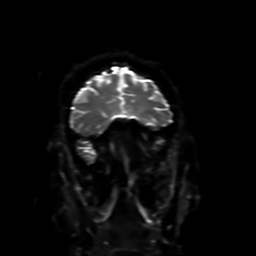
[im 35/70]
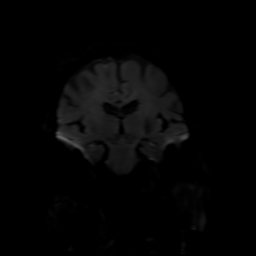
[im 52/70]
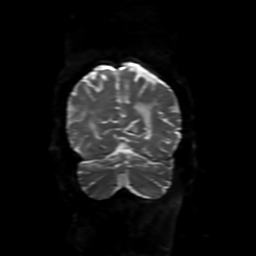
[im 70/70]
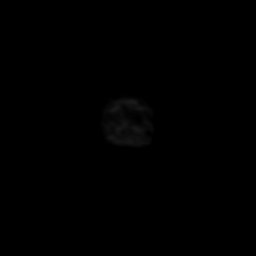

[Series 4: FLAIR · sagittal · 5.0mm · 0.23mm/px · 2 of 26 slices shown (1 of 2)]
[im 1/26]
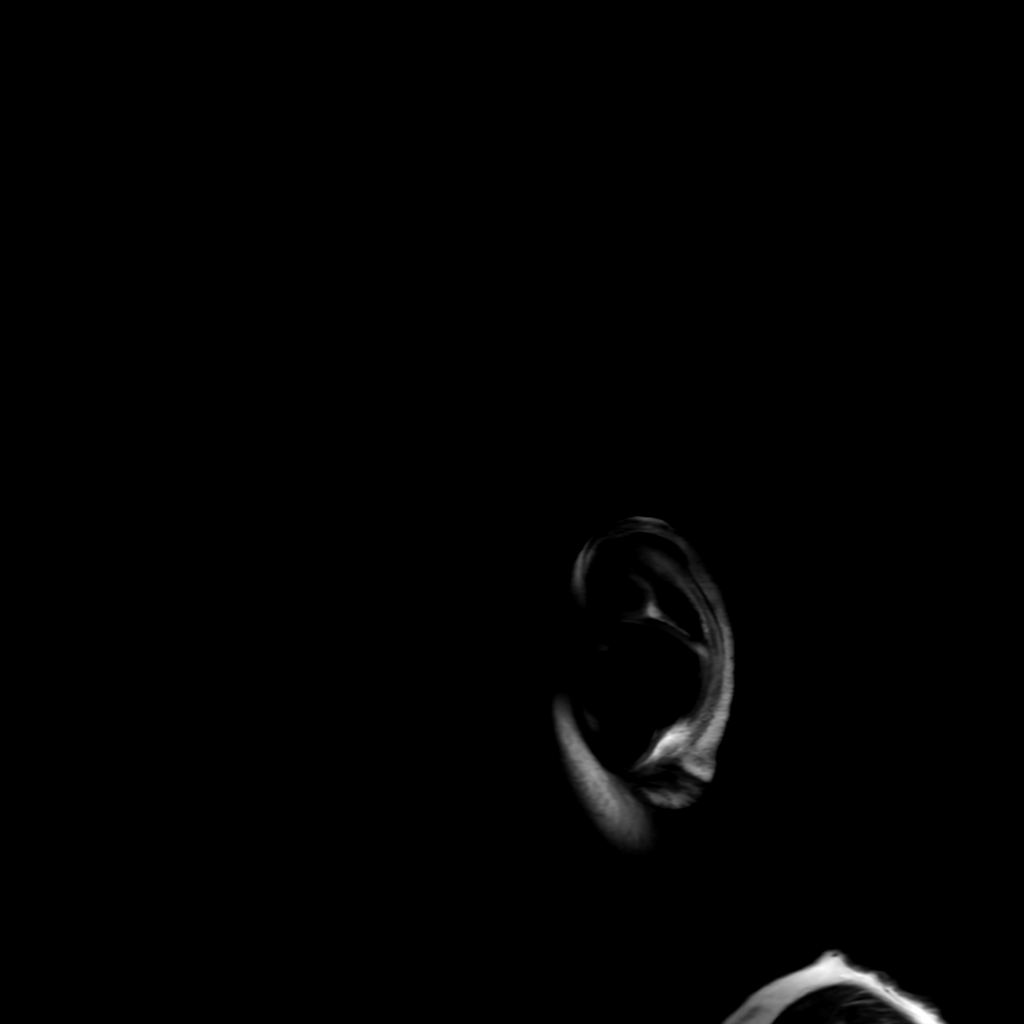
[im 26/26]
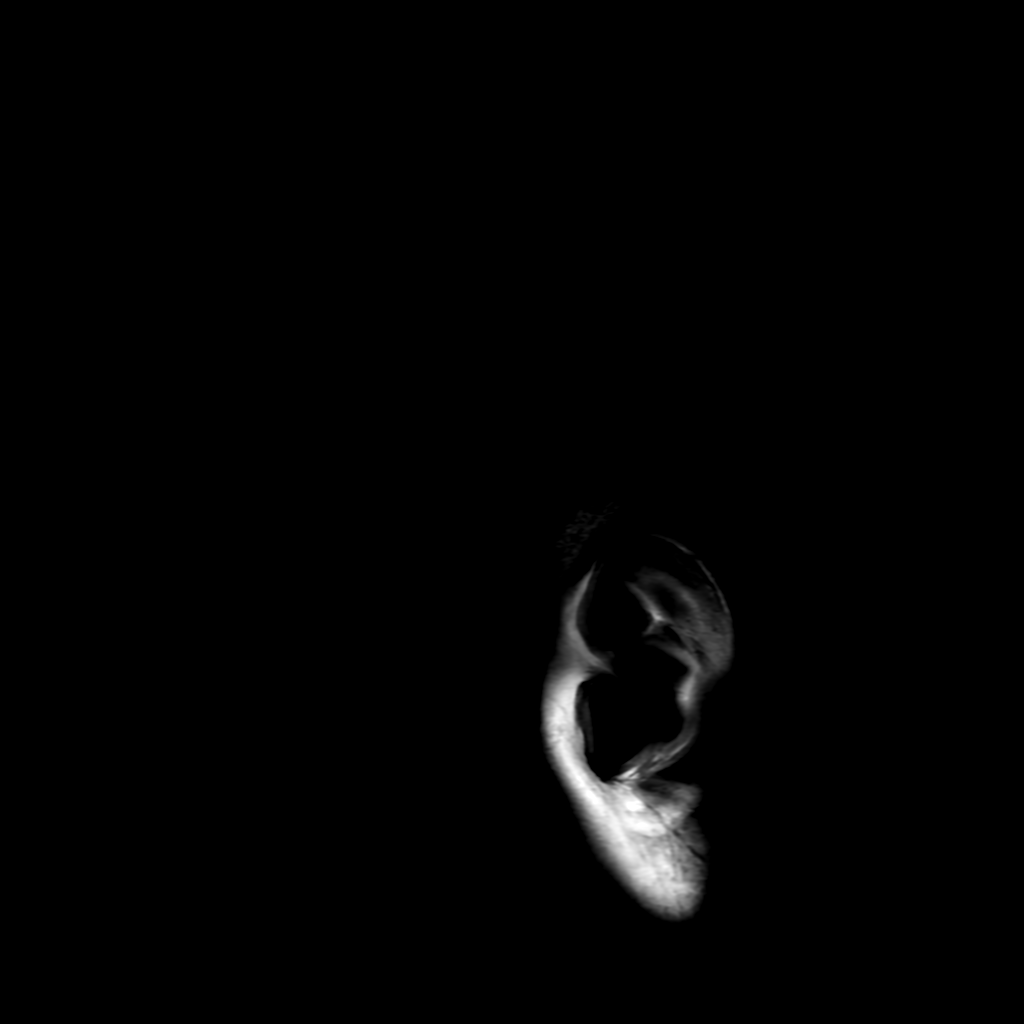

[Series 6: FLAIR · axial · 4.0mm · 0.45mm/px · z∈[-106,+31]mm · 3 of 35 slices shown (2 of 2)]
[im 1/35]
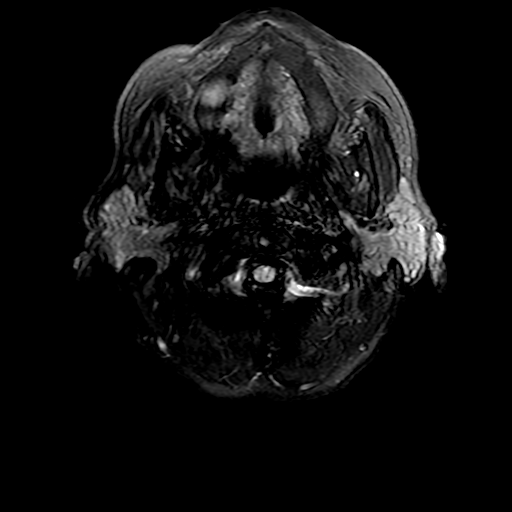
[im 18/35]
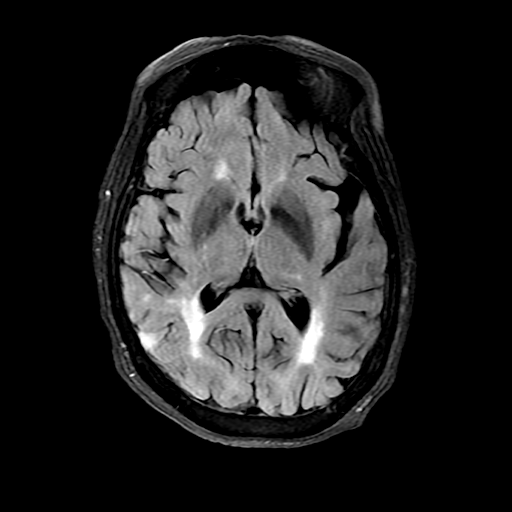
[im 35/35]
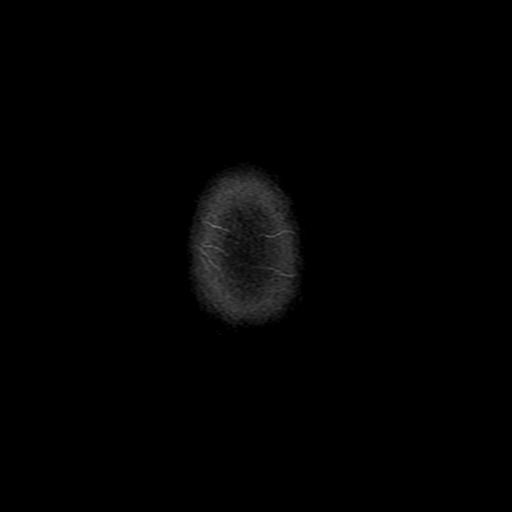

[Series 250: ADC · axial · 3.0mm · 0.94mm/px · z∈[-103,+41]mm · 4 of 52 slices shown (1 of 2)]
[im 1/52]
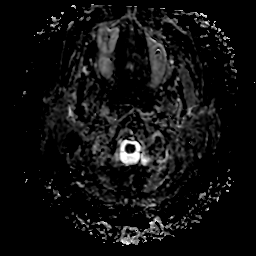
[im 18/52]
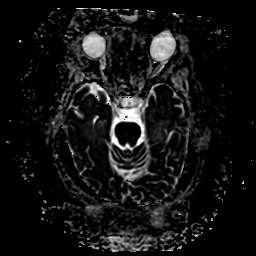
[im 35/52]
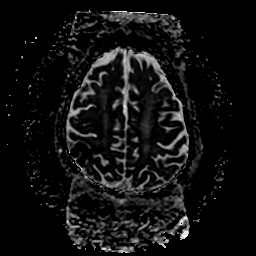
[im 52/52]
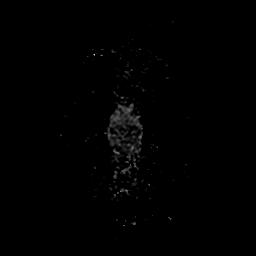

[Series 350: ADC · coronal · 4.0mm · 0.94mm/px · 3 of 34 slices shown (2 of 2)]
[im 1/34]
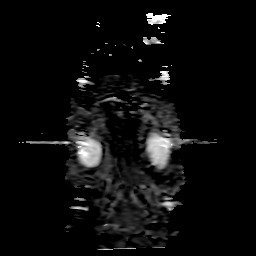
[im 17/34]
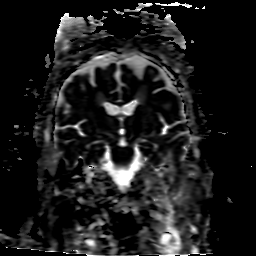
[im 34/34]
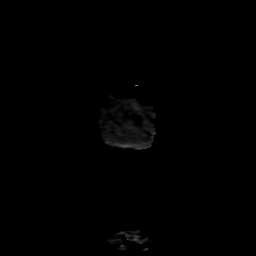

[24 of 48 positions shown; findings below may reference images not displayed]

FINDINGS: Brain: Diffusion imaging shows extensive patchy infarction in the
right parietal lobe and to a lesser extent the posterior frontal
lobe, consistent with embolic infarctions in the right MCA
territory. No mass effect or hemorrhage. Elsewhere, chronic
small-vessel ischemic changes affect pons. No focal abnormality
affects the cerebellum. Cerebral hemispheres show extensive chronic
small-vessel ischemic changes throughout the white matter. No
hydrocephalus or extra-axial collection.

Vascular: Major vessels at the base of the brain show flow.

Skull and upper cervical spine: Negative

Sinuses/Orbits: Mucosal inflammatory changes throughout the
paranasal sinuses. Hypodense scratch that hypointense material
within the sphenoid sinus that could indicate fungal sinusitis.

Other: None
IMPRESSION: Extensive patchy infarction in the right parietal lobe and to a
lesser extent the posterior frontal lobe consistent with embolic
infarction in the right MCA territory. No mass effect or hemorrhage.

Extensive chronic small-vessel ischemic changes elsewhere,
especially the cerebral hemispheric white matter.

Sinus inflammatory disease with widespread mucosal thickening.
Hypointense material filling the sphenoid sinus suggests fungal
sinusitis.

## 2021-01-06 IMAGING — MR MR CERVICAL SPINE W/O CM
4 of 6 series · 19 of 48 positions shown · non-contrast
Comparison: CT same day.

CLINICAL DATA: Spinal stenosis.

EXAM:
MRI CERVICAL SPINE WITHOUT CONTRAST
TECHNIQUE: Multiplanar, multisequence MR imaging of the cervical spine was
performed. No intravenous contrast was administered.

[Series 3: T2 · sagittal · 3.0mm · 0.31mm/px · 4 of 16 slices shown (1 of 2)]
[im 1/16]
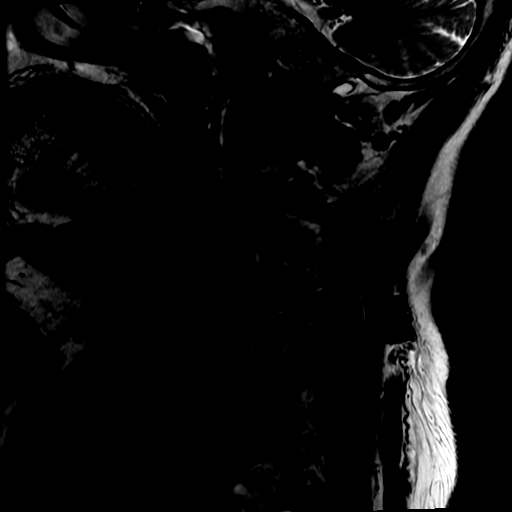
[im 6/16]
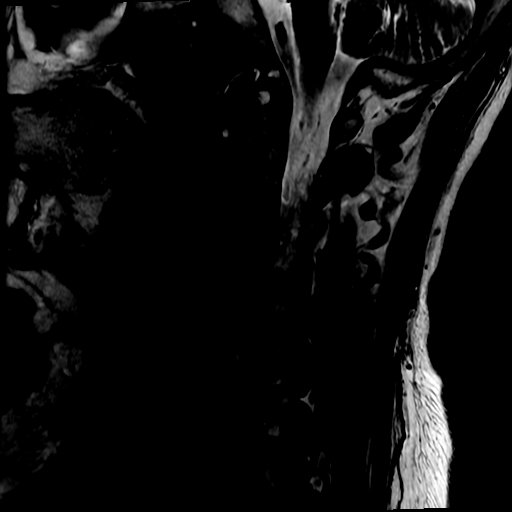
[im 11/16]
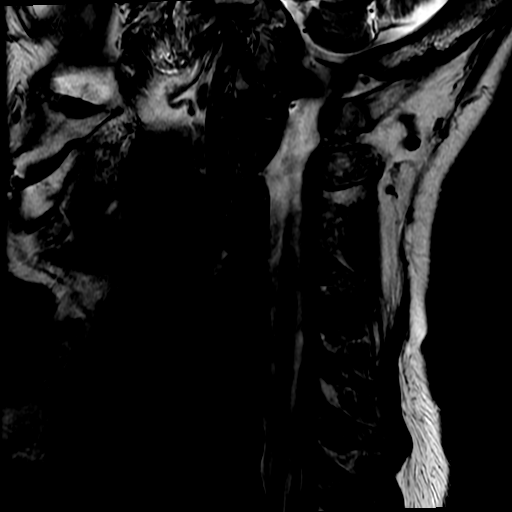
[im 16/16]
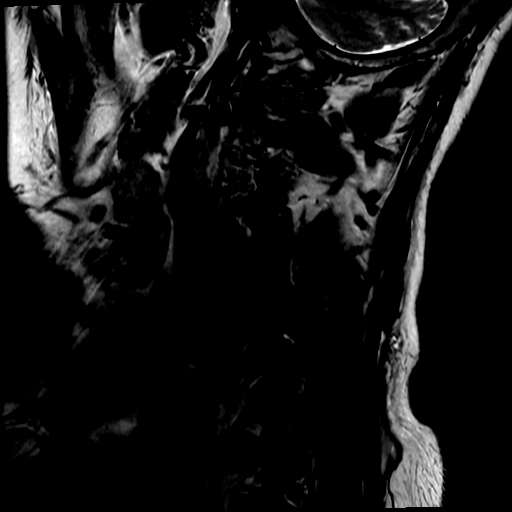

[Series 5: STIR · sagittal · 3.0mm · 0.31mm/px · 3 of 16 slices shown]
[im 1/16]
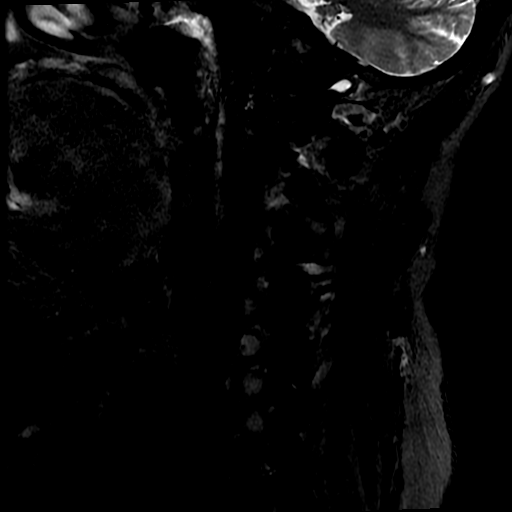
[im 8/16]
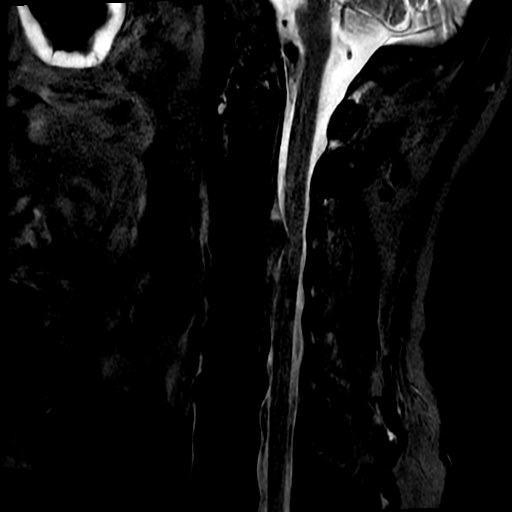
[im 16/16]
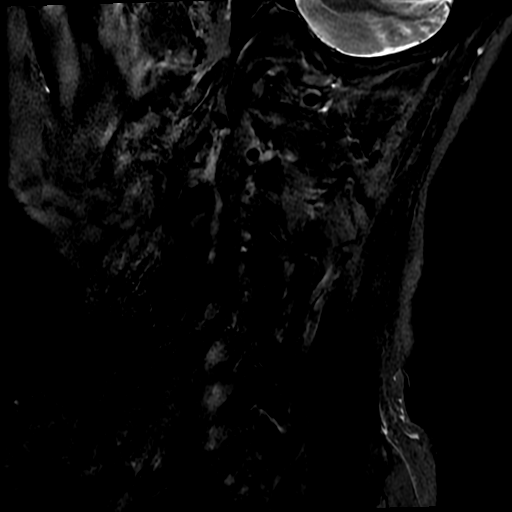

[Series 7: T2 · axial · 3.0mm · 0.35mm/px · z∈[-191,-76]mm · 8 of 37 slices shown (2 of 2)]
[im 1/37]
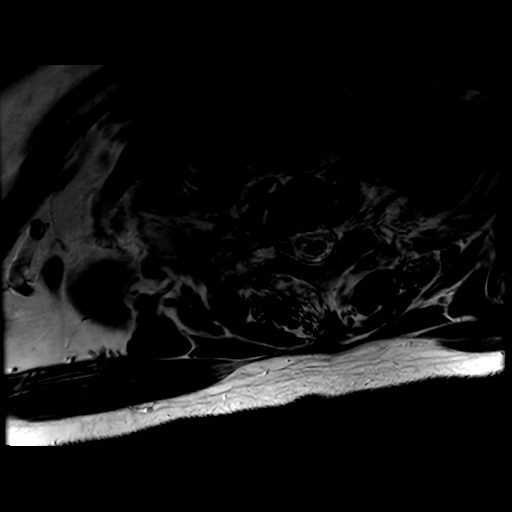
[im 6/37]
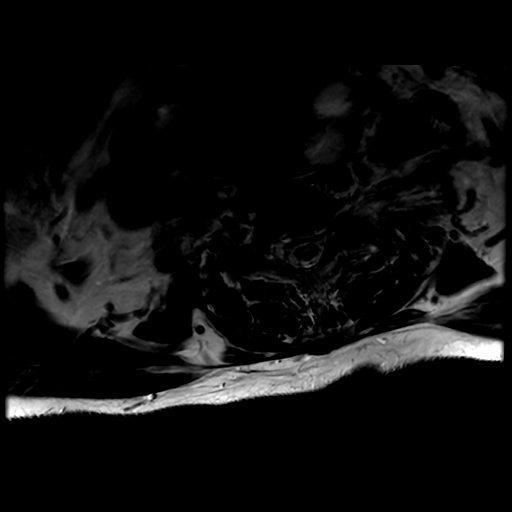
[im 11/37]
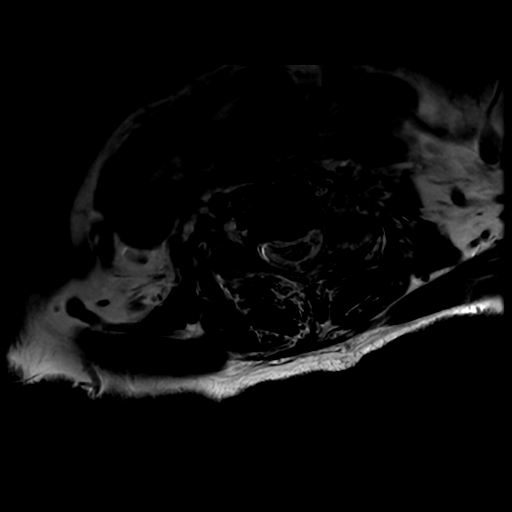
[im 16/37]
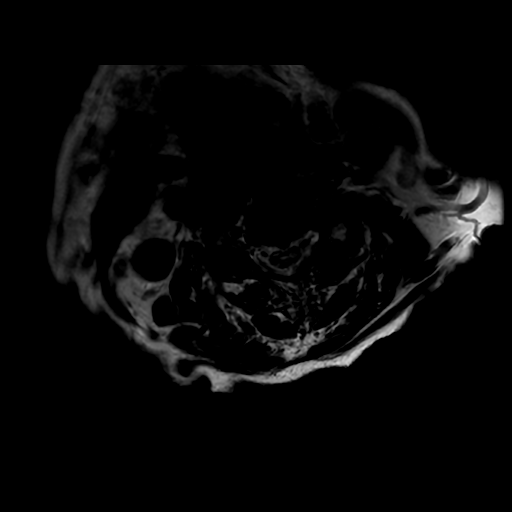
[im 21/37]
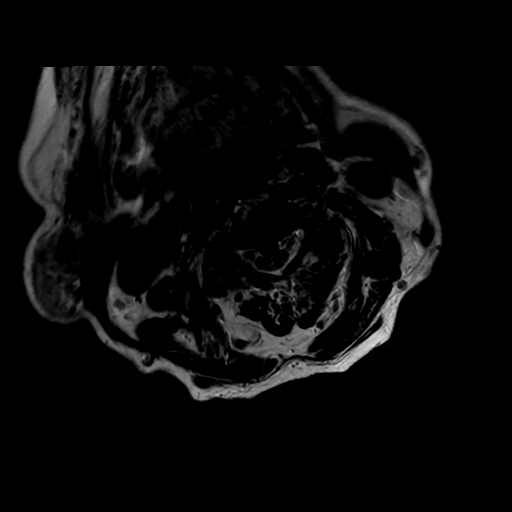
[im 26/37]
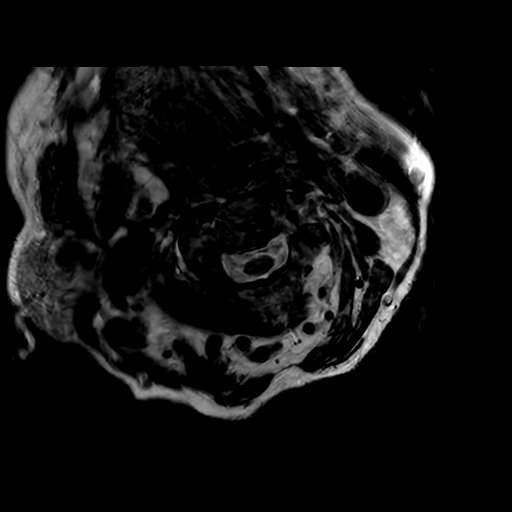
[im 31/37]
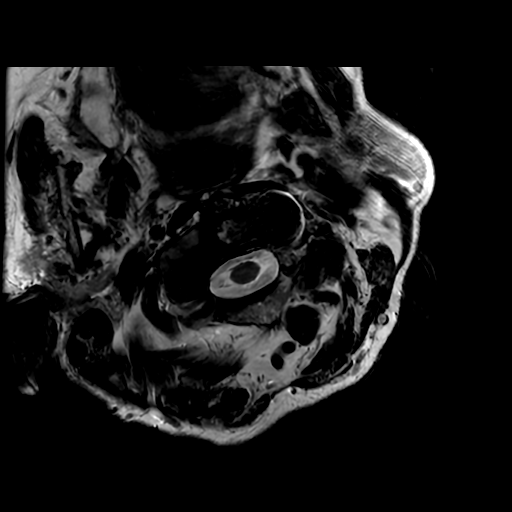
[im 37/37]
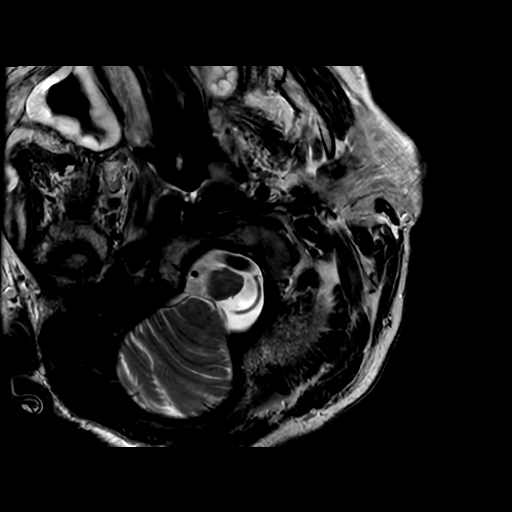

[Series 8: T1 · axial · non-contrast · 3.0mm · 0.35mm/px · z∈[-191,-95]mm · 4 of 34 slices shown]
[im 1/34]
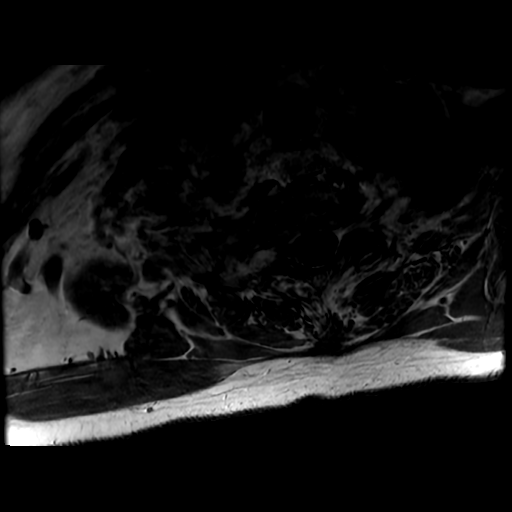
[im 6/34]
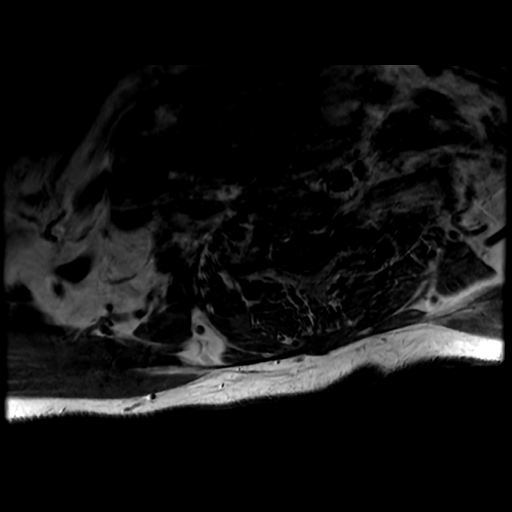
[im 17/34]
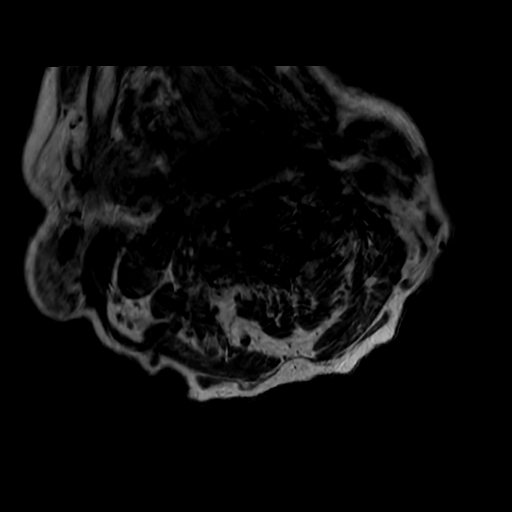
[im 28/34]
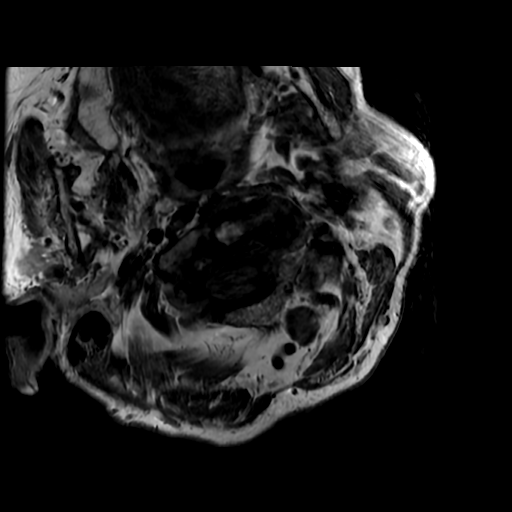

[19 of 48 positions shown; findings below may reference images not displayed]

FINDINGS: The study suffers from considerable motion degradation.

Alignment: Straightening of the normal cervical lordosis.

Vertebrae: No fracture or focal bone lesion.

Cord: See below regarding stenosis.

Posterior Fossa, vertebral arteries, paraspinal tissues: See results
of brain MRI. Soft tissues of the neck appear unremarkable.

Disc levels:

Foramen magnum is widely patent.  C1-2 and C2-3 are unremarkable.

C3-4: Central disc herniation effaces the ventral subarachnoid space
and indents the cord. AP diameter of the canal in the midline
mm. Early abnormal T2 signal within the cord. No foraminal
encroachment.

C4-5: Small central disc herniation effaces the ventral subarachnoid
space but does not deform the cord. AP diameter of the canal 8.2 mm.
No foraminal stenosis.

C5-6: Shallow disc protrusion indents the ventral subarachnoid space
but does not affect cord. AP diameter of the canal 11 mm. No
foraminal stenosis.

C6-7: Mild noncompressive disc bulge.

C7-T1: Normal interspace.
IMPRESSION: Large central disc herniation at C3-4 effaces the ventral
subarachnoid space and indents the ventral cord slightly. AP
diameter of the canal 6.2 mm. Early abnormal T2 signal within the
cord.

Small central disc herniation at C4-5 indents the subarachnoid space
but does not deform the cord or cause compressive stenosis.

Non-compressive disc bulges at C5-6 and C6-7.

## 2021-01-06 MED ORDER — CLOPIDOGREL BISULFATE 75 MG PO TABS
75.0000 mg | ORAL_TABLET | Freq: Every day | ORAL | Status: DC
  Administered 2021-01-07 – 2021-01-08 (×2): 75 mg via ORAL
  Filled 2021-01-06 (×2): qty 1

## 2021-01-06 MED ORDER — LISINOPRIL 20 MG PO TABS
20.0000 mg | ORAL_TABLET | Freq: Two times a day (BID) | ORAL | Status: DC
  Administered 2021-01-06 – 2021-01-08 (×3): 20 mg via ORAL
  Filled 2021-01-06 (×5): qty 1

## 2021-01-06 MED ORDER — LISINOPRIL-HYDROCHLOROTHIAZIDE 20-12.5 MG PO TABS: 1.0000 | ORAL_TABLET | Freq: Two times a day (BID) | ORAL | Status: DC

## 2021-01-06 MED ORDER — ACETAMINOPHEN 650 MG RE SUPP: 650.0000 mg | RECTAL | Status: DC | PRN

## 2021-01-06 MED ORDER — ENOXAPARIN SODIUM 40 MG/0.4ML IJ SOSY
40.0000 mg | PREFILLED_SYRINGE | INTRAMUSCULAR | Status: DC
  Administered 2021-01-06 – 2021-01-07 (×2): 40 mg via SUBCUTANEOUS
  Filled 2021-01-06 (×2): qty 0.4

## 2021-01-06 MED ORDER — ACETAMINOPHEN 160 MG/5ML PO SOLN: 650.0000 mg | ORAL | Status: DC | PRN

## 2021-01-06 MED ORDER — ACETAMINOPHEN 325 MG PO TABS
650.0000 mg | ORAL_TABLET | ORAL | Status: DC | PRN
  Administered 2021-01-06 – 2021-01-07 (×3): 650 mg via ORAL
  Filled 2021-01-06 (×3): qty 2

## 2021-01-06 MED ORDER — STROKE: EARLY STAGES OF RECOVERY BOOK
Freq: Once | Status: AC
  Filled 2021-01-06: qty 1

## 2021-01-06 MED ORDER — ACETAMINOPHEN 325 MG PO TABS
650.0000 mg | ORAL_TABLET | Freq: Four times a day (QID) | ORAL | Status: DC | PRN
Start: 2021-01-06 — End: 2021-01-06
  Administered 2021-01-06: 650 mg via ORAL
  Filled 2021-01-06: qty 2

## 2021-01-06 MED ORDER — DICLOFENAC SODIUM 1 % EX GEL
2.0000 g | Freq: Four times a day (QID) | CUTANEOUS | Status: DC | PRN
  Filled 2021-01-06: qty 100

## 2021-01-06 MED ORDER — HYDROCHLOROTHIAZIDE 12.5 MG PO CAPS
12.5000 mg | ORAL_CAPSULE | Freq: Two times a day (BID) | ORAL | Status: DC
  Administered 2021-01-06: 12.5 mg via ORAL
  Filled 2021-01-06 (×2): qty 1

## 2021-01-06 MED ORDER — CLOPIDOGREL BISULFATE 300 MG PO TABS
300.0000 mg | ORAL_TABLET | Freq: Once | ORAL | Status: AC
  Administered 2021-01-06: 300 mg via ORAL
  Filled 2021-01-06: qty 1

## 2021-01-06 MED ORDER — ASPIRIN 81 MG PO CHEW
324.0000 mg | CHEWABLE_TABLET | Freq: Once | ORAL | Status: AC
  Administered 2021-01-06: 324 mg via ORAL
  Filled 2021-01-06: qty 4

## 2021-01-06 MED ORDER — ASPIRIN EC 81 MG PO TBEC
81.0000 mg | DELAYED_RELEASE_TABLET | Freq: Every day | ORAL | Status: DC
  Administered 2021-01-07: 81 mg via ORAL
  Filled 2021-01-06: qty 1

## 2021-01-06 MED ORDER — IOHEXOL 350 MG/ML SOLN
100.0000 mL | Freq: Once | INTRAVENOUS | Status: AC | PRN
  Administered 2021-01-06: 75 mL via INTRAVENOUS

## 2021-01-06 NOTE — ED Provider Notes (Addendum)
[facility] Provider Note  CSN: [id number] Arrival date & time: 01/05/21 1902  Chief Complaint(s) Fall and Numbness  HPI CARLOYN [name] is a 87 y.o. female with a past medical history listed below who presents to the emergency departments after 2 falls at home earlier this morning.  Patient reports that she has been having bilateral hand numbness and weakness since sometime yesterday midday.  The bilateral hand numbness and weakness has gradually worsened since onset.  It has progressed to left lower extremity weakness as well.  There are no alleviating or aggravating factors.  The falls did not result any head trauma or loss of consciousness.  Patient is not anticoagulated.  There is no history of prior strokes.  She denied any associated chest pain or shortness of breath.  No abdominal pain.  No recent fevers or infections.  No coughing or congestion.  No nausea or vomiting.   Fall   Past Medical History Past Medical History:  Diagnosis Date   Allergic rhinitis    Arthritis    Carpal tunnel syndrome    Deep venous thrombosis (HCC)    bilateral legs   Degenerative arthritis    right knee   Hypertension    Lumbar degenerative disc disease    Lump or mass in breast    Paresthesia    Syncope 05/24/2013   Vitamin D deficiency    Patient Active Problem List   Diagnosis Date Noted   CVA (cerebral vascular accident) ([place]) 01/06/2021   Pain due to onychomycosis of toenails of both feet 12/14/2018   Presbycusis of both ears 09/22/2015   Syncope 05/24/2013   Home Medication(s) Prior to Admission medications   Medication Sig Start Date End Date Taking? Authorizing Provider  albuterol (PROAIR HFA) 108 (90 Base) MCG/ACT inhaler Inhale 1-2 puffs into the lungs every 6 (six) hours as needed for wheezing or shortness of breath.    [provider]  amLODipine (NORVASC) 5 MG tablet Take 5 mg by mouth daily.  06/05/17   Shelda Pal, DO   aspirin 81 MG tablet Take 81 mg by mouth daily.    [provider]  chlorhexidine (PERIDEX) 0.12 % solution Use as directed 15 mLs in the mouth or throat 4 (four) times daily. Swish and spit after meals 09/09/18   Quintella Reichert, MD  diclofenac sodium (VOLTAREN) 1 % GEL Apply 2-4 g topically 4 (four) times daily as needed (for knee pain).    [provider]  gabapentin (NEURONTIN) 300 MG capsule Take 300 mg by mouth daily.     [provider]  lisinopril-hydrochlorothiazide (PRINZIDE,ZESTORETIC) 20-12.5 MG per tablet Take 1 tablet by mouth daily.    [provider]  Multiple Vitamin (MULTIVITAMIN WITH MINERALS) TABS tablet Take 1 tablet by mouth 2 (two) times a week.     [provider]  Past Surgical History Past Surgical History:  Procedure Laterality Date   ABDOMINAL HYSTERECTOMY     CARPAL TUNNEL RELEASE Right    CATARACT EXTRACTION Bilateral    HEMORROIDECTOMY     IVC Filter     TOTAL HIP ARTHROPLASTY Right    ULNAR NERVE TRANSPOSITION Right    Family History Family History  Problem Relation Age of Onset   Diabetes Mother    Kidney failure Father    Cancer - Lung Brother    Cancer - Prostate Brother    Kidney failure Son     Social History Social History   Tobacco Use   Smoking status: Every Day    Packs/day: 10.00    Years: 30.00    Pack years: 300.00    Types: Cigarettes   Smokeless tobacco: Never  Substance Use Topics   Alcohol use: Yes    Comment: occasional   Drug use: No   Allergies Codeine and Penicillins  Review of Systems Review of Systems All other systems are reviewed and are negative for acute change except as noted in the HPI  Physical Exam Vital Signs  I have reviewed the triage vital signs BP (!) 180/86   Pulse 65   Temp 98.1 F (36.7 C) (Oral)   Resp 20   Ht 5\' 8"   (1.727 m)   Wt 94.3 kg   SpO2 100%   BMI 31.63 kg/m   Physical Exam Vitals reviewed.  Constitutional:      General: She is not in acute distress.    Appearance: She is well-developed. She is not diaphoretic.  HENT:     Head: Normocephalic and atraumatic.     Nose: Nose normal.  Eyes:     General: No scleral icterus.       Right eye: No discharge.        Left eye: No discharge.     Conjunctiva/sclera: Conjunctivae normal.     Pupils: Pupils are unequal (right (75mm), left (38mm)).  Cardiovascular:     Rate and Rhythm: Normal rate and regular rhythm.     Heart sounds: No murmur heard.   No friction rub. No gallop.  Pulmonary:     Effort: Pulmonary effort is normal. No respiratory distress.     Breath sounds: Normal breath sounds. No stridor. No rales.  Abdominal:     General: There is no distension.     Palpations: Abdomen is soft.     Tenderness: There is no abdominal tenderness.  Musculoskeletal:        General: No tenderness.     Cervical back: Normal range of motion and neck supple.  Skin:    General: Skin is warm and dry.     Findings: No erythema or rash.  Neurological:     Mental Status: She is alert and oriented to person, place, and time.     Comments: Mental Status:  Alert and oriented to person, place, and time.  Attention and concentration normal.  Speech clear.  Recent memory is intact  Cranial Nerves:  II Visual Fields: Intact to confrontation. Visual fields intact. III, IV, VI: Pupils equal and reactive to light and near. Full eye movement without nystagmus  V Facial Sensation: Normal. No weakness of masticatory muscles  VII: No facial weakness or asymmetry  VIII Auditory Acuity: Grossly normal  IX/X: The uvula is midline; the palate elevates symmetrically  XI: Normal sternocleidomastoid and trapezius strength  XII: The tongue is midline. No atrophy or fasciculations.  Motor System: Muscle Strength: 4 out of 5 left upper and lower extremity strength.   Decreased bilateral grip strength.  5 out of 5 right upper and lower extremity strength (large joints). left upper extremity pronation and drift Muscle Tone: Tone and muscle bulk are normal in the upper and lower extremities.  Coordination: Dysmetria with finger-to-nose on the left Sensation: Decreased sensation in the left upper and lower extremities.  When touching bilateral extremities, patient neglects sensation on left extremity. Gait: deferred     ED Results and Treatments Labs (all labs ordered are listed, but only abnormal results are displayed) Labs Reviewed  COMPREHENSIVE METABOLIC PANEL - Abnormal; Notable for the following components:      Result Value   Glucose, Bld 123 (*)    AST 11 (*)    All other components within normal limits  PROTIME-INR - Abnormal; Notable for the following components:   Prothrombin Time 16.3 (*)    INR 1.3 (*)    All other components within normal limits  URINALYSIS, ROUTINE W REFLEX MICROSCOPIC - Abnormal; Notable for the following components:   Ketones, ur TRACE (*)    Protein, ur TRACE (*)    All other components within normal limits  RESP PANEL BY RT-PCR (FLU A&B, COVID) ARPGX2  CBC WITH DIFFERENTIAL/PLATELET  ETHANOL  APTT  RAPID URINE DRUG SCREEN, HOSP PERFORMED                                                                                                                         EKG  EKG Interpretation  Date/Time: January 06, 2021 00:47   Ventricular Rate:  59 PR Interval:  170 QRS Duration: 154 QT Interval:  479 QTC Calculation: 475 R Axis:   -60 Text Interpretation: Normal sinus rhythm.  Right bundle branch block and left anterior fascicular block.  New since prior tracing in 2006       Radiology CT Angio Head W or Wo Contrast  Result Date: 01/06/2021 CLINICAL DATA:  Initial evaluation for neuro deficit, stroke suspected. EXAM: CT ANGIOGRAPHY HEAD AND NECK TECHNIQUE: Multidetector CT imaging of the head and neck was  performed using the standard protocol during bolus administration of intravenous contrast. Multiplanar CT image reconstructions and MIPs were obtained to evaluate the vascular anatomy. Carotid stenosis measurements (when applicable) are obtained utilizing NASCET criteria, using the distal internal carotid diameter as the denominator. CONTRAST:  63mL OMNIPAQUE IOHEXOL 350 MG/ML SOLN COMPARISON:  Head CT from 01/05/2021. FINDINGS: CTA NECK FINDINGS Aortic arch: Visualized aortic arch normal in caliber with normal branch pattern. Mild for age atheromatous change about the aortic arch. No stenosis about the origin of the great vessels. Right carotid system: Right CCA tortuous proximally but is widely patent to the bifurcation. No significant atheromatous narrowing about the right carotid bulb. Right ICA widely patent without stenosis dissection or occlusion. Left carotid system: Left CCA tortuous proximally but is widely patent to the bifurcation. No significant atheromatous narrowing about the left  carotid bulb. Left ICA widely patent distally without stenosis, dissection or occlusion. Vertebral arteries: Both vertebral arteries arise from the subclavian arteries. Left vertebral artery slightly dominant. No proximal subclavian artery stenosis. Vertebral arteries patent without stenosis dissection or occlusion. Skeleton: No visible acute osseous finding. No discrete or worrisome osseous lesions. Other neck: No other acute soft tissue abnormality within the neck. Few hypodense left thyroid nodules noted, largest of which measures 2.1 cm. In the setting of significant comorbidities or limited life expectancy, no follow-up recommended (ref: J Am Coll Radiol. 2015 Feb;12(2): 143-50).No other mass or adenopathy. Chronic paranasal sinus disease noted. Upper chest: Visualized upper chest demonstrates no acute finding. Review of the MIP images confirms the above findings CTA HEAD FINDINGS Anterior circulation: Petrous segments  patent bilaterally. Mild for age atheromatous change within the carotid siphons without stenosis. A1 segments widely patent. Normal anterior communicating artery complex. Anterior cerebral arteries patent to their distal aspects without stenosis. Left M1 segment widely patent. Normal left MCA bifurcation. Distal left MCA branches widely patent and well perfused. Right M1 segment patent proximally. There is a severe high-grade stenosis at the distal right M1 segment/right MCA bifurcation (series 9, image 20). This is just beyond the takeoff of a small anterior temporal branch. Right MCA branches patent distally. Posterior circulation: Both V4 segments patent to the vertebrobasilar junction without stenosis. Left PICA origin patent. Right PICA not seen. Basilar patent to its distal aspect without stenosis. Superior cerebral arteries patent bilaterally. Left PCA supplied via the basilar. Right PCA supplied via the basilar as well as a robust right posterior communicating artery. Both PCAs patent to their distal aspects without stenosis. Venous sinuses: Grossly patent allowing for timing the contrast bolus. Anatomic variants: None significant.  No aneurysm. Review of the MIP images confirms the above findings IMPRESSION: 1. Negative CTA for large vessel occlusion. 2. Severe high-grade stenosis at the distal right M1 segment/right MCA bifurcation. 3. Relatively minor atheromatous disease for patient age elsewhere about the major arterial vasculature of the head and neck. No other hemodynamically significant or correctable stenosis. 4. Chronic paranasal sinusitis. Electronically Signed   By: Jeannine Boga M.D.   On: 01/06/2021 04:24   CT HEAD WO CONTRAST (5MM)  Result Date: 01/05/2021 CLINICAL DATA:  Recent falls today with headaches and neck pain, initial encounter EXAM: CT HEAD WITHOUT CONTRAST CT CERVICAL SPINE WITHOUT CONTRAST TECHNIQUE: Multidetector CT imaging of the head and cervical spine was performed  following the standard protocol without intravenous contrast. Multiplanar CT image reconstructions of the cervical spine were also generated. COMPARISON:  09/09/2018 FINDINGS: CT HEAD FINDINGS Brain: No evidence of acute infarction, hemorrhage, hydrocephalus, extra-axial collection or mass lesion/mass effect. Chronic atrophic and ischemic changes are noted Vascular: No hyperdense vessel or unexpected calcification. Skull: Normal. Negative for fracture or focal lesion. Sinuses/Orbits: No acute finding. Other: None. CT CERVICAL SPINE FINDINGS Alignment: Stable straightening of the normal cervical lordosis. Skull base and vertebrae: 7 cervical segments are well visualized. Vertebral body height is well maintained. Osteophytic changes are noted predominately from C5-C7. No acute fracture or acute facet abnormality is noted. Multilevel facet hypertrophic changes are seen. Soft tissues and spinal canal: Surrounding soft tissue structures are well visualized. No acute abnormality is noted. There are 2 adjacent hypodense lesions in the left lobe of the thyroid. The largest of these measures 2.0 cm. Upper chest: Visualized lung apices are within normal limits. Other: None IMPRESSION: CT of the head: Chronic atrophic and ischemic changes. No acute abnormality noted.  CT of the cervical spine: Multilevel degenerative change. Hypodense thyroid nodules in the left lobe of the liver as described. No follow-up recommended unless clinically warranted (ref: J Am Coll Radiol. 2015 Feb;12(2): 143-50). Electronically Signed   By: Inez Catalina M.D.   On: 01/05/2021 22:28   CT Angio Neck W and/or Wo Contrast  Result Date: 01/06/2021 CLINICAL DATA:  Initial evaluation for neuro deficit, stroke suspected. EXAM: CT ANGIOGRAPHY HEAD AND NECK TECHNIQUE: Multidetector CT imaging of the head and neck was performed using the standard protocol during bolus administration of intravenous contrast. Multiplanar CT image reconstructions and MIPs  were obtained to evaluate the vascular anatomy. Carotid stenosis measurements (when applicable) are obtained utilizing NASCET criteria, using the distal internal carotid diameter as the denominator. CONTRAST:  1mL OMNIPAQUE IOHEXOL 350 MG/ML SOLN COMPARISON:  Head CT from 01/05/2021. FINDINGS: CTA NECK FINDINGS Aortic arch: Visualized aortic arch normal in caliber with normal branch pattern. Mild for age atheromatous change about the aortic arch. No stenosis about the origin of the great vessels. Right carotid system: Right CCA tortuous proximally but is widely patent to the bifurcation. No significant atheromatous narrowing about the right carotid bulb. Right ICA widely patent without stenosis dissection or occlusion. Left carotid system: Left CCA tortuous proximally but is widely patent to the bifurcation. No significant atheromatous narrowing about the left carotid bulb. Left ICA widely patent distally without stenosis, dissection or occlusion. Vertebral arteries: Both vertebral arteries arise from the subclavian arteries. Left vertebral artery slightly dominant. No proximal subclavian artery stenosis. Vertebral arteries patent without stenosis dissection or occlusion. Skeleton: No visible acute osseous finding. No discrete or worrisome osseous lesions. Other neck: No other acute soft tissue abnormality within the neck. Few hypodense left thyroid nodules noted, largest of which measures 2.1 cm. In the setting of significant comorbidities or limited life expectancy, no follow-up recommended (ref: J Am Coll Radiol. 2015 Feb;12(2): 143-50).No other mass or adenopathy. Chronic paranasal sinus disease noted. Upper chest: Visualized upper chest demonstrates no acute finding. Review of the MIP images confirms the above findings CTA HEAD FINDINGS Anterior circulation: Petrous segments patent bilaterally. Mild for age atheromatous change within the carotid siphons without stenosis. A1 segments widely patent. Normal  anterior communicating artery complex. Anterior cerebral arteries patent to their distal aspects without stenosis. Left M1 segment widely patent. Normal left MCA bifurcation. Distal left MCA branches widely patent and well perfused. Right M1 segment patent proximally. There is a severe high-grade stenosis at the distal right M1 segment/right MCA bifurcation (series 9, image 20). This is just beyond the takeoff of a small anterior temporal branch. Right MCA branches patent distally. Posterior circulation: Both V4 segments patent to the vertebrobasilar junction without stenosis. Left PICA origin patent. Right PICA not seen. Basilar patent to its distal aspect without stenosis. Superior cerebral arteries patent bilaterally. Left PCA supplied via the basilar. Right PCA supplied via the basilar as well as a robust right posterior communicating artery. Both PCAs patent to their distal aspects without stenosis. Venous sinuses: Grossly patent allowing for timing the contrast bolus. Anatomic variants: None significant.  No aneurysm. Review of the MIP images confirms the above findings IMPRESSION: 1. Negative CTA for large vessel occlusion. 2. Severe high-grade stenosis at the distal right M1 segment/right MCA bifurcation. 3. Relatively minor atheromatous disease for patient age elsewhere about the major arterial vasculature of the head and neck. No other hemodynamically significant or correctable stenosis. 4. Chronic paranasal sinusitis. Electronically Signed   By: Jeannine Boga  M.D.   On: 01/06/2021 04:24   CT Cervical Spine Wo Contrast  Result Date: 01/05/2021 CLINICAL DATA:  Recent falls today with headaches and neck pain, initial encounter EXAM: CT HEAD WITHOUT CONTRAST CT CERVICAL SPINE WITHOUT CONTRAST TECHNIQUE: Multidetector CT imaging of the head and cervical spine was performed following the standard protocol without intravenous contrast. Multiplanar CT image reconstructions of the cervical spine were  also generated. COMPARISON:  09/09/2018 FINDINGS: CT HEAD FINDINGS Brain: No evidence of acute infarction, hemorrhage, hydrocephalus, extra-axial collection or mass lesion/mass effect. Chronic atrophic and ischemic changes are noted Vascular: No hyperdense vessel or unexpected calcification. Skull: Normal. Negative for fracture or focal lesion. Sinuses/Orbits: No acute finding. Other: None. CT CERVICAL SPINE FINDINGS Alignment: Stable straightening of the normal cervical lordosis. Skull base and vertebrae: 7 cervical segments are well visualized. Vertebral body height is well maintained. Osteophytic changes are noted predominately from C5-C7. No acute fracture or acute facet abnormality is noted. Multilevel facet hypertrophic changes are seen. Soft tissues and spinal canal: Surrounding soft tissue structures are well visualized. No acute abnormality is noted. There are 2 adjacent hypodense lesions in the left lobe of the thyroid. The largest of these measures 2.0 cm. Upper chest: Visualized lung apices are within normal limits. Other: None IMPRESSION: CT of the head: Chronic atrophic and ischemic changes. No acute abnormality noted. CT of the cervical spine: Multilevel degenerative change. Hypodense thyroid nodules in the left lobe of the liver as described. No follow-up recommended unless clinically warranted (ref: J Am Coll Radiol. 2015 Feb;12(2): 143-50). Electronically Signed   By: Inez Catalina M.D.   On: 01/05/2021 22:28    Pertinent labs & imaging results that were available during my care of the patient were reviewed by me and considered in my medical decision making (see MDM for details).  Medications Ordered in ED Medications  iohexol (OMNIPAQUE) 350 MG/ML injection 100 mL (75 mLs Intravenous Contrast Given 01/06/21 0302)  aspirin chewable tablet 324 mg (324 mg Oral Given 01/06/21 0453)  clopidogrel (PLAVIX) tablet 300 mg (300 mg Oral Given 01/06/21 0453)                                                                                                                                      Procedures .1-3 Lead EKG Interpretation Performed by: Fatima Blank, MD Authorized by: Fatima Blank, MD     Interpretation: normal     ECG rate:  73   ECG rate assessment: normal     Rhythm: sinus rhythm     Ectopy: none     Conduction: normal    (including critical care time)  Medical Decision Making / ED Course I have reviewed the nursing notes for this encounter and the patient's prior records (if available in EHR or on provided paperwork).  [name] was evaluated in Emergency Department on 01/06/2021 for the symptoms described in the history  of present illness. She was evaluated in the context of the global COVID-19 pandemic, which necessitated consideration that the patient might be at risk for infection with the SARS-CoV-2 virus that causes COVID-19. Institutional protocols and algorithms that pertain to the evaluation of patients at risk for COVID-19 are in a state of rapid change based on information released by regulatory bodies including the CDC and federal and state organizations. These policies and algorithms were followed during the patient's care in the ED.     Patient with bilateral hand grip and mostly left sided weakness and loss of sensation. Given recent falls, CT head and cervical spine were obtained. Also need to assess for possible CVA versus cervical cord stenosis. Will assess for any electrolyte derangements.  Patient out of the window for tPA or intravascular intervention given the onset of symptoms.  Pertinent labs & imaging results that were available during my care of the patient were reviewed by me and considered in my medical decision making:  CT head and cervical spine without ICH or acute fracture or dislocation. No significant electrolyte derangements noted on metabolic panel. Renal function intact. No anemia. No  leukocytosis. COVID/influenza negative.  Consulted with neurology who agreed with above plan.  They requested CTA of the head and neck prior to being admitted in order to expedite work-up.  Consulted with hospitalist service who will admit patient for further work-up and management.  4:31 AM CTA of the head and neck is notable for high-grade stenosis in the right M1 territory consistent with the patient's left-sided weakness. Initial NIH ~ 5. ASA and plavix given.  Final Clinical Impression(s) / ED Diagnoses Final diagnoses:  Acute ischemic right MCA stroke [facility])     This chart was dictated using voice recognition software.  Despite best efforts to proofread,  errors can occur which can change the documentation meaning.          Fatima Blank, MD 01/06/21 [phone]

## 2021-01-06 NOTE — ED Notes (Signed)
Update given to daughter-Barbara at bedside.

## 2021-01-06 NOTE — Consult Note (Signed)
Neurology Consultation Reason for Consult: Left sided weakness Requesting Physician: Fuller Plan  CC: Bilateral hand weakness, falls and left leg weakness  History is obtained from: Patient, daughter at bedside and chart review  HPI: [name] is a 87 y.o. right-handed woman with a past medical history significant for ongoing smoking (1/3 PPD currently), hypertension, degenerative disc disease of the C-spine and L-spine, right upper extremity carpal tunnel syndrome, prior DVTs (bilateral legs),  She reports she was in her normal state of health Monday afternoon when she went to a funeral.  She was extending and flexing her neck significantly during with services to watch the predictor screen read etc.  About 4 PM when she went to get up she had trouble holding her cane.  She subsequently noticed bilateral upper extremity weakness just in the hands.  On Tuesday morning she had 2 falls after which she noticed left leg weakness and presented to the ED for further evaluation.  She denies any focal neurological signs or symptoms prior to 9/19  LKW: Monday 9/19 at 1 PM tPA given?: No, due to out of the window IA performed?: No, out of the window and no LVO Premorbid modified rankin scale:      1 - No significant disability. Able to carry out all usual activities, despite some symptoms.    ROS: All other review of systems was negative except as noted in the HPI.   Past Medical History:  Diagnosis Date   Allergic rhinitis    Arthritis    Carpal tunnel syndrome    Deep venous thrombosis (HCC)    bilateral legs   Degenerative arthritis    right knee   Hypertension    Lumbar degenerative disc disease    Lump or mass in breast    Paresthesia    Syncope 05/24/2013   Vitamin D deficiency    Past Surgical History:  Procedure Laterality Date   ABDOMINAL HYSTERECTOMY     CARPAL TUNNEL RELEASE Right    CATARACT EXTRACTION Bilateral    HEMORROIDECTOMY     IVC Filter     TOTAL HIP  ARTHROPLASTY Right    ULNAR NERVE TRANSPOSITION Right    Current Outpatient Medications  Medication Instructions   albuterol (VENTOLIN HFA) 108 (90 Base) MCG/ACT inhaler 1-2 puffs, Inhalation, Every 6 hours PRN   diclofenac sodium (VOLTAREN) 2-4 g, Topical, 4 times daily PRN   lisinopril-hydrochlorothiazide (PRINZIDE,ZESTORETIC) 20-12.5 MG per tablet 1 tablet, Oral, 2 times daily     Family History  Problem Relation Age of Onset   Diabetes Mother    Kidney failure Father    Cancer - Lung Brother    Cancer - Prostate Brother    Kidney failure Son     Social History:  reports that she has been smoking. She has a 300.00 pack-year smoking history. She has never used smokeless tobacco. She reports current alcohol use. She reports that she does not use drugs.   Exam: Current vital signs: BP (!) 151/74 (BP Location: Left Arm)   Pulse 62   Temp 98.5 F (36.9 C) (Axillary)   Resp 19   Ht 5' 8"  (1.727 m)   Wt 97.2 kg   SpO2 100%   BMI 32.58 kg/m  Vital signs in last 24 hours: Temp:  [98.1 F (36.7 C)-98.5 F (36.9 C)] 98.5 F (36.9 C) (09/21 1617) Pulse Rate:  [55-67] 62 (09/21 1617) Resp:  [13-21] 19 (09/21 1617) BP: (125-186)/(55-150) 151/74 (09/21 1617) SpO2:  [98 %-100 %]  100 % (09/21 1617) Weight:  [97.2 kg] 97.2 kg (09/21 1617)   Physical Exam  Constitutional: Appears well-developed and well-nourished, thin.  Psych: Affect appropriate to situation, calm and cooperative Eyes: No scleral injection HENT: No oropharyngeal obstruction.  MSK: no joint deformities.  Cardiovascular: Normal rate and regular rhythm.  Respiratory: Effort normal, non-labored breathing GI: Soft.  No distension. There is no tenderness.  Skin: Warm dry and intact visible skin  Neuro: Mental Status: Patient is awake, alert, oriented to person, place, month, year, and situation. Patient is able to give a clear and coherent history. No signs of aphasia, but there is neglect of double  simultaneous stimuli on the left (vision as well as sensory) Cranial Nerves: II: Visual Fields are full. Pupils are anisocoric, right 1.5 mm to 1 mm, left 4 mm to 2 mm III,IV, VI: EOMI without ptosis or diploplia.  V: Facial sensation is symmetric to temperature VII: Facial movement is notable for mild left facial droop, baseline per family.  VIII: hearing is intact to voice X: Uvula elevates symmetrically XI: Shoulder shrug is symmetric. XII: tongue is midline without atrophy or fasciculations.  Motor: There is pronation without drift of the left upper extremity Tone is normal. Bulk is normal. 5/5 strength was present in all four extremities, except the following    Right/left Finger extension 4+/4 Wrist extension 4+/4+ Hip flexion 4 -/4 Knee flexion 5/4+ Sensory: Sensation is symmetric to light touch and temperature in the arms and legs, but there is numbness in the bilateral hands, worst at the fingertips.  Vibration and proprioception not tested due to code stroke activation Deep Tendon Reflexes: 3+ and symmetric in the biceps, 3 beats of clonus at the patella and 4 beats of clonus at the bilateral ankles.  Plantars: Toes are downgoing bilaterally.  Cerebellar: FNF and HKS are intact bilaterally  NIHSS total  Score breakdown: One-point for left leg drift, one-point for extinction, one-point for numbness in the bilateral hands    I have reviewed labs in epic and the results pertinent to this consultation are:  Cr 1.0  Lab Results  Component Value Date   CHOL 131 01/07/2021   HDL 68 01/07/2021   LDLCALC 53 01/07/2021   TRIG 51 01/07/2021   CHOLHDL 1.9 01/07/2021   No results found for: HGBA1C   I have reviewed the images obtained:  CTA head and neck personally reviewed, agree with radiology: 1. Negative CTA for large vessel occlusion. 2. Severe high-grade stenosis at the distal right M1 segment/right MCA bifurcation. 3. Relatively minor atheromatous disease for  patient age elsewhere about the major arterial vasculature of the head and neck. No other hemodynamically significant or correctable stenosis. 4. Chronic paranasal sinusitis.  MRI brain personally reviewed, agree with radiology: Extensive patchy infarction in the right parietal lobe and to a lesser extent the posterior frontal lobe consistent with embolic infarction in the right MCA territory. No mass effect or hemorrhage.   Extensive chronic small-vessel ischemic changes elsewhere, especially the cerebral hemispheric white matter.   Sinus inflammatory disease with widespread mucosal thickening. Hypointense material filling the sphenoid sinus suggests fungal sinusitis.   MRI C-spine personally reviewed, agree with radiology: Large central disc herniation at C3-4 effaces the ventral subarachnoid space and indents the ventral cord slightly. AP diameter of the canal 6.2 mm. Early abnormal T2 signal within the cord.   Small central disc herniation at C4-5 indents the subarachnoid space but does not deform the cord or cause compressive stenosis.  Non-compressive disc bulges at C5-6 and C6-7.  Impression: 87 year old woman presenting with simultaneous likely cervical spine compression as of 9/19 (with symptoms relatively stable), and a superimposed right MCA stroke  Recommendations:  # Right MCA atheroembolic stroke - Stroke labs TSH, ESR, RPR, HgbA1c, fasting lipid panel - Frequent neuro checks - Echocardiogram pending - Prophylactic therapy-Antiplatelet med: Aspirin - dose 371m PO or 3085mPR, followed by 81 mg daily lifelong -S/p Plavix 300 mg load, continue 75 mg daily for 90 day course, though may be paused for neurosurgical intervention if urgent cervical spine intervention felt to be strongly needed - Risk factor modification, diet, exercise, smoking cessation counseling - Telemetry monitoring; 30 day event monitor on discharge if no arrythmias captured given unexplained  falls - Blood pressure goal: Given symptom onset is more than 48 hours ago, she is appropriate for very gradual normalization of blood pressures at this time, which can be fully completed on an outpatient basis.  Given her severe stenosis it may be possible that she is unable to tolerate true normotension and may need slightly higher blood pressure depending on performance.  - PT consult, OT consult, Speech consult, unless patient is back to baseline - Stroke team to follow  #C3/4 central disc herniation with early T2 signal change -Neurosurgery consult in the morning (not yet placed) -Hard collar for precautions overnight  #Sinus inflammatory disease on MRI brain concerning for fungal infection -Management per primary team  SrLesleigh NoeD-PhD [facility] [phone] 7 PM to 7 AM, outside of these hours please call Neurologist on call as listed on Amion.

## 2021-01-06 NOTE — H&P (Signed)
History and Physical    [name] YBO:[id number] DOB: 1928-06-10 DOA: 01/05/2021  Referring MD/NP/PA: Cleon Gustin, MD PCP: Nolene Ebbs, MD  Patient coming from: [facility] transfer  Chief Complaint: Fall  I have personally briefly reviewed patient's old medical records in [place]   HPI: [name] is a 87 y.o. right hand female with medical history significant of hypertension, DVT of bilateral legs, and arthritis presents after having 2 falls at home yesterday morning.  She first fell while trying to get up to use the bathroom and then fell a second time while coming back from the bathroom after she was able to pick herself up.  She did hit the left side of her head on the dresser and hit her right knee.  She is not on any blood thinners at baseline.  Patient reports that symptoms likely started 2 days ago when she noticed that she was losing control of her right hand and kept dropping her cane as the day went on symptoms seem to go to the left hand as well.  Denies any recent fevers, headache, changes in vision, slurred speech, facial droop, cough, shortness of breath, nausea, vomiting, abdominal pain, or dysuria.  At baseline the patient is still very independent, uses a cane to ambulate, able to drive, and complete all of her ADLs without assistance.    ED Course: Upon admission to the emergency department patient was seen to be afebrile, pulse 55-67, blood pressures elevated up to 207/70, and all other vital signs maintained.  CT scan of the head and cervical spine noted chronic atrophy and ischemic changes with no acute abnormality noted and multilevel degenerative changes of the cervical spine.  Labs significant for INR 1.3, and all other labs relatively within normal limits.  CT angiogram of the head and neck did not note any large vessel occlusion, but did note severe high-grade stenosis of the distal right M1 segment/right MCA bifurcation.  Influenza and  COVID-19 screening were negative.  Urinalysis did not note significant signs of infection.  The patient had been given full dose aspirin and Plavix.  Review of Systems  Constitutional:  Negative for fever and malaise/fatigue.  Eyes:  Negative for double vision and photophobia.  Respiratory:  Negative for cough and shortness of breath.   Cardiovascular:  Negative for chest pain and leg swelling.  Gastrointestinal:  Negative for abdominal pain, nausea and vomiting.  Genitourinary:  Negative for dysuria and hematuria.  Musculoskeletal:  Positive for falls.  Skin:  Negative for rash.  Neurological:  Positive for sensory change. Negative for speech change and loss of consciousness.  Psychiatric/Behavioral:  Negative for substance abuse.    Past Medical History:  Diagnosis Date   Allergic rhinitis    Arthritis    Carpal tunnel syndrome    Deep venous thrombosis (HCC)    bilateral legs   Degenerative arthritis    right knee   Hypertension    Lumbar degenerative disc disease    Lump or mass in breast    Paresthesia    Syncope 05/24/2013   Vitamin D deficiency     Past Surgical History:  Procedure Laterality Date   ABDOMINAL HYSTERECTOMY     CARPAL TUNNEL RELEASE Right    CATARACT EXTRACTION Bilateral    HEMORROIDECTOMY     IVC Filter     TOTAL HIP ARTHROPLASTY Right    ULNAR NERVE TRANSPOSITION Right      reports that she has been smoking. She  has a 300.00 pack-year smoking history. She has never used smokeless tobacco. She reports current alcohol use. She reports that she does not use drugs.  Allergies  Allergen Reactions   Codeine Other (See Comments)    Passed out Pass out Passed out   Penicillins Other (See Comments)    Passed out Has patient had a PCN reaction causing immediate rash, facial/tongue/throat swelling, SOB or lightheadedness with hypotension: Yes Has patient had a PCN reaction causing severe rash involving mucus membranes or skin necrosis: Unk Has patient  had a PCN reaction that required hospitalization: No Has patient had a PCN reaction occurring within the last 10 years: No If all of the above answers are "NO", then may proceed with Cephalosporin use.  Pass out Passed out Has patient had a PCN reaction causing immediate rash, facial/tongue/throat swelling, SOB or lightheadedness with hypotension: Yes Has patient had a PCN reaction causing severe rash involving mucus membranes or skin necrosis: Unk Has patient had a PCN reaction that required hospitalization: No Has patient had a PCN reaction occurring within the last 10 years: No If all of the above answers are "NO", then may proceed with Cephalosporin use.    Family History  Problem Relation Age of Onset   Diabetes Mother    Kidney failure Father    Cancer - Lung Brother    Cancer - Prostate Brother    Kidney failure Son     Prior to Admission medications   Medication Sig Start Date End Date Taking? Authorizing Provider  diclofenac sodium (VOLTAREN) 1 % GEL Apply 2-4 g topically 4 (four) times daily as needed (for knee pain).   Yes [provider]  lisinopril-hydrochlorothiazide (PRINZIDE,ZESTORETIC) 20-12.5 MG per tablet Take 1 tablet by mouth 2 (two) times daily.   Yes [provider]  albuterol (VENTOLIN HFA) 108 (90 Base) MCG/ACT inhaler Inhale 1-2 puffs into the lungs every 6 (six) hours as needed for wheezing or shortness of breath.    [provider]  chlorhexidine (PERIDEX) 0.12 % solution Use as directed 15 mLs in the mouth or throat 4 (four) times daily. Swish and spit after meals Patient not taking: No sig reported 09/09/18   Quintella Reichert, MD    Physical Exam:  Constitutional: Elderly female who appears in no acute distress Vitals:   01/06/21 1300 01/06/21 1400 01/06/21 1500 01/06/21 1617  BP: (!) 166/150 (!) 176/127 (!) 140/59 (!) 151/74  Pulse: 61 62 67 62  Resp: 18 18 (!) 21 19  Temp:   98.1 F (36.7 C) 98.5 F (36.9 C)  TempSrc:    Oral Axillary  SpO2: 100% 100% 99% 100%  Weight:    97.2 kg  Height:    5\' 8"  (1.727 m)   Eyes: PERRL, lids and conjunctivae normal ENMT: Mucous membranes are moist. Posterior pharynx clear of any exudate or lesions. mild swelling of the left side of the head likely  Neck: normal, supple, no masses, no thyromegaly Respiratory: clear to auscultation bilaterally, no wheezing, no crackles. Normal respiratory effort. No accessory muscle use.  Cardiovascular: Regular rate and rhythm, no murmurs / rubs / gallops. No extremity edema. 2+ pedal pulses. No carotid bruits.  Abdomen: no tenderness, no masses palpated. No hepatosplenomegaly. Bowel sounds positive.  Musculoskeletal: no clubbing / cyanosis.  Crepitation noted at the knee with no significant joint deformity Skin: no rashes, lesions, ulcers. No induration Neurologic: CN 2-12 grossly intact.  Abnormal sensation. Psychiatric: Normal judgment and insight. Alert and oriented x 3.  Normal mood.     Labs on Admission: I have personally reviewed following labs and imaging studies  CBC: Recent Labs  Lab 01/06/21 0037  WBC 6.0  NEUTROABS 3.3  HGB 13.4  HCT 40.8  MCV 96.2  PLT 086   Basic Metabolic Panel: Recent Labs  Lab 01/06/21 0037  NA 139  K 3.6  CL 106  CO2 25  GLUCOSE 123*  BUN 9  CREATININE 0.85  CALCIUM 9.3   GFR: Estimated Creatinine Clearance: 53.6 mL/min (by C-G formula based on SCr of 0.85 mg/dL). Liver Function Tests: Recent Labs  Lab 01/06/21 0037  AST 11*  ALT 8  ALKPHOS 52  BILITOT 0.9  PROT 6.6  ALBUMIN 3.9   No results for input(s): LIPASE, AMYLASE in the last 168 hours. No results for input(s): AMMONIA in the last 168 hours. Coagulation Profile: Recent Labs  Lab 01/06/21 0037  INR 1.3*   Cardiac Enzymes: No results for input(s): CKTOTAL, CKMB, CKMBINDEX, TROPONINI in the last 168 hours. BNP (last 3 results) No results for input(s): PROBNP in the last 8760 hours. HbA1C: No results for  input(s): HGBA1C in the last 72 hours. CBG: No results for input(s): GLUCAP in the last 168 hours. Lipid Profile: No results for input(s): CHOL, HDL, LDLCALC, TRIG, CHOLHDL, LDLDIRECT in the last 72 hours. Thyroid Function Tests: No results for input(s): TSH, T4TOTAL, FREET4, T3FREE, THYROIDAB in the last 72 hours. Anemia Panel: No results for input(s): VITAMINB12, FOLATE, FERRITIN, TIBC, IRON, RETICCTPCT in the last 72 hours. Urine analysis:    Component Value Date/Time   COLORURINE YELLOW 01/06/2021 0103   APPEARANCEUR CLEAR 01/06/2021 0103   LABSPEC 1.023 01/06/2021 0103   PHURINE 5.5 01/06/2021 0103   GLUCOSEU NEGATIVE 01/06/2021 0103   HGBUR NEGATIVE 01/06/2021 0103   BILIRUBINUR NEGATIVE 01/06/2021 0103   KETONESUR TRACE (A) 01/06/2021 0103   PROTEINUR TRACE (A) 01/06/2021 0103   UROBILINOGEN 1.0 11/07/2009 2127   NITRITE NEGATIVE 01/06/2021 0103   LEUKOCYTESUR NEGATIVE 01/06/2021 0103   Sepsis Labs: Recent Results (from the past 240 hour(s))  Resp Panel by RT-PCR (Flu A&B, Covid) Nasopharyngeal Swab     Status: None   Collection Time: 01/06/21 12:37 AM   Specimen: Nasopharyngeal Swab; Nasopharyngeal(NP) swabs in vial transport medium  Result Value Ref Range Status   SARS Coronavirus 2 by RT PCR NEGATIVE NEGATIVE Final    Comment: (NOTE) SARS-CoV-2 target nucleic acids are NOT DETECTED.  The SARS-CoV-2 RNA is generally detectable in upper respiratory specimens during the acute phase of infection. The lowest concentration of SARS-CoV-2 viral copies this assay can detect is 138 copies/mL. A negative result does not preclude SARS-Cov-2 infection and should not be used as the sole basis for treatment or other patient management decisions. A negative result may occur with  improper specimen collection/handling, submission of specimen other than nasopharyngeal swab, presence of viral mutation(s) within the areas targeted by this assay, and inadequate number of  viral copies(<138 copies/mL). A negative result must be combined with clinical observations, patient history, and epidemiological information. The expected result is Negative.  Fact Sheet for Patients:  EntrepreneurPulse.com.au  Fact Sheet for Healthcare Providers:  IncredibleEmployment.be  This test is no t yet approved or cleared by the [place] FDA and  has been authorized for detection and/or diagnosis of SARS-CoV-2 by FDA under an Emergency Use Authorization (EUA). This EUA will remain  in effect (meaning this test can be used) for the duration of the COVID-19 declaration under Section 564(b)(1)  of the Act, 21 U.S.C.section 360bbb-3(b)(1), unless the authorization is terminated  or revoked sooner.       Influenza A by PCR NEGATIVE NEGATIVE Final   Influenza B by PCR NEGATIVE NEGATIVE Final    Comment: (NOTE) The Xpert Xpress SARS-CoV-2/FLU/RSV plus assay is intended as an aid in the diagnosis of influenza from Nasopharyngeal swab specimens and should not be used as a sole basis for treatment. Nasal washings and aspirates are unacceptable for Xpert Xpress SARS-CoV-2/FLU/RSV testing.  Fact Sheet for Patients: EntrepreneurPulse.com.au  Fact Sheet for Healthcare Providers: IncredibleEmployment.be  This test is not yet approved or cleared by the [place] FDA and has been authorized for detection and/or diagnosis of SARS-CoV-2 by FDA under an Emergency Use Authorization (EUA). This EUA will remain in effect (meaning this test can be used) for the duration of the COVID-19 declaration under Section 564(b)(1) of the Act, 21 U.S.C. section 360bbb-3(b)(1), unless the authorization is terminated or revoked.  Performed at [facility], [address] [place], [place] [address]      Radiological Exams on Admission: CT Angio Head W or Wo Contrast  Result Date:  01/06/2021 CLINICAL DATA:  Initial evaluation for neuro deficit, stroke suspected. EXAM: CT ANGIOGRAPHY HEAD AND NECK TECHNIQUE: Multidetector CT imaging of the head and neck was performed using the standard protocol during bolus administration of intravenous contrast. Multiplanar CT image reconstructions and MIPs were obtained to evaluate the vascular anatomy. Carotid stenosis measurements (when applicable) are obtained utilizing NASCET criteria, using the distal internal carotid diameter as the denominator. CONTRAST:  13mL OMNIPAQUE IOHEXOL 350 MG/ML SOLN COMPARISON:  Head CT from 01/05/2021. FINDINGS: CTA NECK FINDINGS Aortic arch: Visualized aortic arch normal in caliber with normal branch pattern. Mild for age atheromatous change about the aortic arch. No stenosis about the origin of the great vessels. Right carotid system: Right CCA tortuous proximally but is widely patent to the bifurcation. No significant atheromatous narrowing about the right carotid bulb. Right ICA widely patent without stenosis dissection or occlusion. Left carotid system: Left CCA tortuous proximally but is widely patent to the bifurcation. No significant atheromatous narrowing about the left carotid bulb. Left ICA widely patent distally without stenosis, dissection or occlusion. Vertebral arteries: Both vertebral arteries arise from the subclavian arteries. Left vertebral artery slightly dominant. No proximal subclavian artery stenosis. Vertebral arteries patent without stenosis dissection or occlusion. Skeleton: No visible acute osseous finding. No discrete or worrisome osseous lesions. Other neck: No other acute soft tissue abnormality within the neck. Few hypodense left thyroid nodules noted, largest of which measures 2.1 cm. In the setting of significant comorbidities or limited life expectancy, no follow-up recommended (ref: J Am Coll Radiol. 2015 Feb;12(2): 143-50).No other mass or adenopathy. Chronic paranasal sinus disease  noted. Upper chest: Visualized upper chest demonstrates no acute finding. Review of the MIP images confirms the above findings CTA HEAD FINDINGS Anterior circulation: Petrous segments patent bilaterally. Mild for age atheromatous change within the carotid siphons without stenosis. A1 segments widely patent. Normal anterior communicating artery complex. Anterior cerebral arteries patent to their distal aspects without stenosis. Left M1 segment widely patent. Normal left MCA bifurcation. Distal left MCA branches widely patent and well perfused. Right M1 segment patent proximally. There is a severe high-grade stenosis at the distal right M1 segment/right MCA bifurcation (series 9, image 20). This is just beyond the takeoff of a small anterior temporal branch. Right MCA branches patent distally. Posterior circulation: Both V4 segments patent to the vertebrobasilar junction  without stenosis. Left PICA origin patent. Right PICA not seen. Basilar patent to its distal aspect without stenosis. Superior cerebral arteries patent bilaterally. Left PCA supplied via the basilar. Right PCA supplied via the basilar as well as a robust right posterior communicating artery. Both PCAs patent to their distal aspects without stenosis. Venous sinuses: Grossly patent allowing for timing the contrast bolus. Anatomic variants: None significant.  No aneurysm. Review of the MIP images confirms the above findings IMPRESSION: 1. Negative CTA for large vessel occlusion. 2. Severe high-grade stenosis at the distal right M1 segment/right MCA bifurcation. 3. Relatively minor atheromatous disease for patient age elsewhere about the major arterial vasculature of the head and neck. No other hemodynamically significant or correctable stenosis. 4. Chronic paranasal sinusitis. Electronically Signed   By: Jeannine Boga M.D.   On: 01/06/2021 04:24   CT HEAD WO CONTRAST (5MM)  Result Date: 01/05/2021 CLINICAL DATA:  Recent falls today with  headaches and neck pain, initial encounter EXAM: CT HEAD WITHOUT CONTRAST CT CERVICAL SPINE WITHOUT CONTRAST TECHNIQUE: Multidetector CT imaging of the head and cervical spine was performed following the standard protocol without intravenous contrast. Multiplanar CT image reconstructions of the cervical spine were also generated. COMPARISON:  09/09/2018 FINDINGS: CT HEAD FINDINGS Brain: No evidence of acute infarction, hemorrhage, hydrocephalus, extra-axial collection or mass lesion/mass effect. Chronic atrophic and ischemic changes are noted Vascular: No hyperdense vessel or unexpected calcification. Skull: Normal. Negative for fracture or focal lesion. Sinuses/Orbits: No acute finding. Other: None. CT CERVICAL SPINE FINDINGS Alignment: Stable straightening of the normal cervical lordosis. Skull base and vertebrae: 7 cervical segments are well visualized. Vertebral body height is well maintained. Osteophytic changes are noted predominately from C5-C7. No acute fracture or acute facet abnormality is noted. Multilevel facet hypertrophic changes are seen. Soft tissues and spinal canal: Surrounding soft tissue structures are well visualized. No acute abnormality is noted. There are 2 adjacent hypodense lesions in the left lobe of the thyroid. The largest of these measures 2.0 cm. Upper chest: Visualized lung apices are within normal limits. Other: None IMPRESSION: CT of the head: Chronic atrophic and ischemic changes. No acute abnormality noted. CT of the cervical spine: Multilevel degenerative change. Hypodense thyroid nodules in the left lobe of the liver as described. No follow-up recommended unless clinically warranted (ref: J Am Coll Radiol. 2015 Feb;12(2): 143-50). Electronically Signed   By: Inez Catalina M.D.   On: 01/05/2021 22:28   CT Angio Neck W and/or Wo Contrast  Result Date: 01/06/2021 CLINICAL DATA:  Initial evaluation for neuro deficit, stroke suspected. EXAM: CT ANGIOGRAPHY HEAD AND NECK TECHNIQUE:  Multidetector CT imaging of the head and neck was performed using the standard protocol during bolus administration of intravenous contrast. Multiplanar CT image reconstructions and MIPs were obtained to evaluate the vascular anatomy. Carotid stenosis measurements (when applicable) are obtained utilizing NASCET criteria, using the distal internal carotid diameter as the denominator. CONTRAST:  20mL OMNIPAQUE IOHEXOL 350 MG/ML SOLN COMPARISON:  Head CT from 01/05/2021. FINDINGS: CTA NECK FINDINGS Aortic arch: Visualized aortic arch normal in caliber with normal branch pattern. Mild for age atheromatous change about the aortic arch. No stenosis about the origin of the great vessels. Right carotid system: Right CCA tortuous proximally but is widely patent to the bifurcation. No significant atheromatous narrowing about the right carotid bulb. Right ICA widely patent without stenosis dissection or occlusion. Left carotid system: Left CCA tortuous proximally but is widely patent to the bifurcation. No significant atheromatous narrowing about the  left carotid bulb. Left ICA widely patent distally without stenosis, dissection or occlusion. Vertebral arteries: Both vertebral arteries arise from the subclavian arteries. Left vertebral artery slightly dominant. No proximal subclavian artery stenosis. Vertebral arteries patent without stenosis dissection or occlusion. Skeleton: No visible acute osseous finding. No discrete or worrisome osseous lesions. Other neck: No other acute soft tissue abnormality within the neck. Few hypodense left thyroid nodules noted, largest of which measures 2.1 cm. In the setting of significant comorbidities or limited life expectancy, no follow-up recommended (ref: J Am Coll Radiol. 2015 Feb;12(2): 143-50).No other mass or adenopathy. Chronic paranasal sinus disease noted. Upper chest: Visualized upper chest demonstrates no acute finding. Review of the MIP images confirms the above findings CTA  HEAD FINDINGS Anterior circulation: Petrous segments patent bilaterally. Mild for age atheromatous change within the carotid siphons without stenosis. A1 segments widely patent. Normal anterior communicating artery complex. Anterior cerebral arteries patent to their distal aspects without stenosis. Left M1 segment widely patent. Normal left MCA bifurcation. Distal left MCA branches widely patent and well perfused. Right M1 segment patent proximally. There is a severe high-grade stenosis at the distal right M1 segment/right MCA bifurcation (series 9, image 20). This is just beyond the takeoff of a small anterior temporal branch. Right MCA branches patent distally. Posterior circulation: Both V4 segments patent to the vertebrobasilar junction without stenosis. Left PICA origin patent. Right PICA not seen. Basilar patent to its distal aspect without stenosis. Superior cerebral arteries patent bilaterally. Left PCA supplied via the basilar. Right PCA supplied via the basilar as well as a robust right posterior communicating artery. Both PCAs patent to their distal aspects without stenosis. Venous sinuses: Grossly patent allowing for timing the contrast bolus. Anatomic variants: None significant.  No aneurysm. Review of the MIP images confirms the above findings IMPRESSION: 1. Negative CTA for large vessel occlusion. 2. Severe high-grade stenosis at the distal right M1 segment/right MCA bifurcation. 3. Relatively minor atheromatous disease for patient age elsewhere about the major arterial vasculature of the head and neck. No other hemodynamically significant or correctable stenosis. 4. Chronic paranasal sinusitis. Electronically Signed   By: Jeannine Boga M.D.   On: 01/06/2021 04:24   CT Cervical Spine Wo Contrast  Result Date: 01/05/2021 CLINICAL DATA:  Recent falls today with headaches and neck pain, initial encounter EXAM: CT HEAD WITHOUT CONTRAST CT CERVICAL SPINE WITHOUT CONTRAST TECHNIQUE: Multidetector  CT imaging of the head and cervical spine was performed following the standard protocol without intravenous contrast. Multiplanar CT image reconstructions of the cervical spine were also generated. COMPARISON:  09/09/2018 FINDINGS: CT HEAD FINDINGS Brain: No evidence of acute infarction, hemorrhage, hydrocephalus, extra-axial collection or mass lesion/mass effect. Chronic atrophic and ischemic changes are noted Vascular: No hyperdense vessel or unexpected calcification. Skull: Normal. Negative for fracture or focal lesion. Sinuses/Orbits: No acute finding. Other: None. CT CERVICAL SPINE FINDINGS Alignment: Stable straightening of the normal cervical lordosis. Skull base and vertebrae: 7 cervical segments are well visualized. Vertebral body height is well maintained. Osteophytic changes are noted predominately from C5-C7. No acute fracture or acute facet abnormality is noted. Multilevel facet hypertrophic changes are seen. Soft tissues and spinal canal: Surrounding soft tissue structures are well visualized. No acute abnormality is noted. There are 2 adjacent hypodense lesions in the left lobe of the thyroid. The largest of these measures 2.0 cm. Upper chest: Visualized lung apices are within normal limits. Other: None IMPRESSION: CT of the head: Chronic atrophic and ischemic changes. No acute abnormality  noted. CT of the cervical spine: Multilevel degenerative change. Hypodense thyroid nodules in the left lobe of the liver as described. No follow-up recommended unless clinically warranted (ref: J Am Coll Radiol. 2015 Feb;12(2): 143-50). Electronically Signed   By: Inez Catalina M.D.   On: 01/05/2021 22:28    EKG: Independently reviewed.  Sinus rhythm with RBBB and LAFB  Assessment/Plan Suspected CVA: Patient presents with decreased sensation and weakness of the bilateral upper extremities which there was concern for a stroke.  CT a of the head and cervical spine noted degenerative disc changes no acute  abnormality.  CTA of the head and neck showed no significant large vessel occlusion, but did note severe high-grade stenosis of the distal right M1 segment/MCA bifurcation..  Patient had been loaded with full dose aspirin and Plavix for suspected right MCA stroke. -Admit to a medical telemetry -Neuro checks -Check MRI of the head and cervical spine -Follow-up echocardiogram -PT/OT to eval and treat -Continue aspirin and Plavix -Appreciate neurology consultative services, we will follow-up for any further recommendation  Essential hypertension: Blood pressures elevated up to 207/70 on admission.  Home blood pressure medications include lisinopril hydrochlorothiazide 20-12.5 mg twice daily. -Resume home blood pressure regimen tomorrow  Arthritis -Continue Voltaren gel as needed for knee pain  DVT prophylaxis: lovenox Code Status: Full  Family Communication: Daughter updated at bedside Disposition Plan: Likely discharge home once medically stable Consults called: Neurology Admission status: Observation  Norval Morton MD [facility]   If 7PM-7AM, please contact night-coverage   01/06/2021, 4:29 PM

## 2021-01-06 NOTE — ED Notes (Signed)
Ice pack provided to place on left side of head due to injury from fall yesterday.

## 2021-01-06 NOTE — Plan of Care (Signed)

## 2021-01-06 NOTE — Plan of Care (Signed)
Patient discussed briefly with Dr. Leonette Monarch, 87 year old woman with left sided symptoms since Monday afternoon 9/20, therefore well outside of intervention window. On his examination some left-sided weakness as well as neglect to double sided sensory stimuli Recommended transfer to [facility] for clinical concern for stroke, with CTA completed while at [facility] to continue work-up  CTA personally reviewed, agree with radiology read 1. Negative CTA for large vessel occlusion. 2. Severe high-grade stenosis at the distal right M1 segment/right MCA bifurcation. 3. Relatively minor atheromatous disease for patient age elsewhere about the major arterial vasculature of the head and neck. No other hemodynamically significant or correctable stenosis. 4. Chronic paranasal sinusitis.  Suspect this distal right M1 stenosis is the etiology of her symptoms, and asked Dr. Leonette Monarch to confirm no significant contraindications to dual antiplatelet therapy with initiation of aspirin 325 mg, Plavix 300 mg loading dose.  Subsequently will need lifelong aspirin and 90-day course of Plavix 75 mg daily for optimal stroke prevention.  Neurology will plan to see patient formally in consultation on arrival to [facility].  If there is a significant worsening in her symptoms, code stroke should be activated.  Dr. Leonette Monarch confirms NIH stroke scale is currently 5 .  Advised that if her neurological status is significantly changed code stroke should be activated with STAT repeat head CT as well as CT perfusion for consideration of potential thrombectomy/emergent stent placement  Lesleigh Noe MD-PhD [facility] [phone]  Available 7 PM to 7 AM, outside of these hours please call Neurologist on call as listed on Amion.

## 2021-01-06 NOTE — ED Notes (Signed)
Patient transported to CT 

## 2021-01-06 NOTE — ED Notes (Signed)
Purwick applied, pt able to urinate gold colored urine,

## 2021-01-07 ENCOUNTER — Inpatient Hospital Stay (HOSPITAL_COMMUNITY): Payer: Medicare Other

## 2021-01-07 DIAGNOSIS — Z79899 Other long term (current) drug therapy: Secondary | ICD-10-CM | POA: Diagnosis not present

## 2021-01-07 DIAGNOSIS — I6389 Other cerebral infarction: Secondary | ICD-10-CM

## 2021-01-07 DIAGNOSIS — Z6832 Body mass index (BMI) 32.0-32.9, adult: Secondary | ICD-10-CM | POA: Diagnosis not present

## 2021-01-07 DIAGNOSIS — I63411 Cerebral infarction due to embolism of right middle cerebral artery: Secondary | ICD-10-CM | POA: Diagnosis not present

## 2021-01-07 DIAGNOSIS — Z7982 Long term (current) use of aspirin: Secondary | ICD-10-CM | POA: Diagnosis not present

## 2021-01-07 DIAGNOSIS — I69354 Hemiplegia and hemiparesis following cerebral infarction affecting left non-dominant side: Secondary | ICD-10-CM | POA: Diagnosis not present

## 2021-01-07 DIAGNOSIS — E559 Vitamin D deficiency, unspecified: Secondary | ICD-10-CM | POA: Diagnosis present

## 2021-01-07 DIAGNOSIS — Z841 Family history of disorders of kidney and ureter: Secondary | ICD-10-CM | POA: Diagnosis not present

## 2021-01-07 DIAGNOSIS — Z96641 Presence of right artificial hip joint: Secondary | ICD-10-CM | POA: Diagnosis present

## 2021-01-07 DIAGNOSIS — M1990 Unspecified osteoarthritis, unspecified site: Secondary | ICD-10-CM | POA: Diagnosis present

## 2021-01-07 DIAGNOSIS — M5021 Other cervical disc displacement,  high cervical region: Secondary | ICD-10-CM | POA: Diagnosis present

## 2021-01-07 DIAGNOSIS — J309 Allergic rhinitis, unspecified: Secondary | ICD-10-CM | POA: Diagnosis present

## 2021-01-07 DIAGNOSIS — I452 Bifascicular block: Secondary | ICD-10-CM | POA: Diagnosis present

## 2021-01-07 DIAGNOSIS — E669 Obesity, unspecified: Secondary | ICD-10-CM | POA: Diagnosis present

## 2021-01-07 DIAGNOSIS — Z20822 Contact with and (suspected) exposure to covid-19: Secondary | ICD-10-CM | POA: Diagnosis present

## 2021-01-07 DIAGNOSIS — R29705 NIHSS score 5: Secondary | ICD-10-CM | POA: Diagnosis present

## 2021-01-07 DIAGNOSIS — M4802 Spinal stenosis, cervical region: Secondary | ICD-10-CM

## 2021-01-07 DIAGNOSIS — E785 Hyperlipidemia, unspecified: Secondary | ICD-10-CM | POA: Diagnosis present

## 2021-01-07 DIAGNOSIS — Z7902 Long term (current) use of antithrombotics/antiplatelets: Secondary | ICD-10-CM | POA: Diagnosis not present

## 2021-01-07 DIAGNOSIS — G9520 Unspecified cord compression: Secondary | ICD-10-CM | POA: Diagnosis present

## 2021-01-07 DIAGNOSIS — F17210 Nicotine dependence, cigarettes, uncomplicated: Secondary | ICD-10-CM | POA: Diagnosis present

## 2021-01-07 DIAGNOSIS — I10 Essential (primary) hypertension: Secondary | ICD-10-CM | POA: Diagnosis present

## 2021-01-07 DIAGNOSIS — W19XXXA Unspecified fall, initial encounter: Secondary | ICD-10-CM | POA: Diagnosis present

## 2021-01-07 DIAGNOSIS — I639 Cerebral infarction, unspecified: Secondary | ICD-10-CM | POA: Diagnosis present

## 2021-01-07 DIAGNOSIS — I63511 Cerebral infarction due to unspecified occlusion or stenosis of right middle cerebral artery: Secondary | ICD-10-CM | POA: Diagnosis present

## 2021-01-07 DIAGNOSIS — R29810 Facial weakness: Secondary | ICD-10-CM | POA: Diagnosis present

## 2021-01-07 DIAGNOSIS — R296 Repeated falls: Secondary | ICD-10-CM | POA: Diagnosis present

## 2021-01-07 DIAGNOSIS — Z833 Family history of diabetes mellitus: Secondary | ICD-10-CM | POA: Diagnosis not present

## 2021-01-07 LAB — CBC WITH DIFFERENTIAL/PLATELET
Abs Immature Granulocytes: 0.02 10*3/uL (ref 0.00–0.07)
Basophils Absolute: 0 10*3/uL (ref 0.0–0.1)
Basophils Relative: 0 %
Eosinophils Absolute: 0.2 10*3/uL (ref 0.0–0.5)
Eosinophils Relative: 4 %
HCT: 38.2 % (ref 36.0–46.0)
Hemoglobin: 12.8 g/dL (ref 12.0–15.0)
Immature Granulocytes: 0 %
Lymphocytes Relative: 40 %
Lymphs Abs: 1.8 10*3/uL (ref 0.7–4.0)
MCH: 32.2 pg (ref 26.0–34.0)
MCHC: 33.5 g/dL (ref 30.0–36.0)
MCV: 96 fL (ref 80.0–100.0)
Monocytes Absolute: 0.4 10*3/uL (ref 0.1–1.0)
Monocytes Relative: 9 %
Neutro Abs: 2.2 10*3/uL (ref 1.7–7.7)
Neutrophils Relative %: 47 %
Platelets: 186 10*3/uL (ref 150–400)
RBC: 3.98 MIL/uL (ref 3.87–5.11)
RDW: 13.9 % (ref 11.5–15.5)
WBC: 4.6 10*3/uL (ref 4.0–10.5)
nRBC: 0 % (ref 0.0–0.2)

## 2021-01-07 LAB — ECHOCARDIOGRAM COMPLETE
AR max vel: 3.65 cm2
AV Area VTI: 4.76 cm2
AV Area mean vel: 3.42 cm2
AV Mean grad: 3 mmHg
AV Peak grad: 5.9 mmHg
Ao pk vel: 1.21 m/s
Area-P 1/2: 1.91 cm2
Height: 68 in
S' Lateral: 2.9 cm
Single Plane A4C EF: 76.9 %
Weight: 3428.59 oz

## 2021-01-07 LAB — BASIC METABOLIC PANEL
Anion gap: 7 (ref 5–15)
BUN: 6 mg/dL — ABNORMAL LOW (ref 8–23)
CO2: 23 mmol/L (ref 22–32)
Calcium: 8.6 mg/dL — ABNORMAL LOW (ref 8.9–10.3)
Chloride: 106 mmol/L (ref 98–111)
Creatinine, Ser: 1 mg/dL (ref 0.44–1.00)
GFR, Estimated: 54 mL/min — ABNORMAL LOW (ref >60)
Glucose, Bld: 105 mg/dL — ABNORMAL HIGH (ref 70–99)
Potassium: 3.8 mmol/L (ref 3.5–5.1)
Sodium: 136 mmol/L (ref 135–145)

## 2021-01-07 LAB — LIPID PANEL
Cholesterol: 131 mg/dL (ref 0–200)
HDL: 68 mg/dL (ref >40)
LDL Cholesterol: 53 mg/dL (ref 0–99)
Total CHOL/HDL Ratio: 1.9 RATIO
Triglycerides: 51 mg/dL (ref <150)
VLDL: 10 mg/dL (ref 0–40)

## 2021-01-07 LAB — HEMOGLOBIN A1C
Hgb A1c MFr Bld: 5.7 % — ABNORMAL HIGH (ref 4.8–5.6)
Mean Plasma Glucose: 117 mg/dL

## 2021-01-07 MED ORDER — CLOPIDOGREL BISULFATE 75 MG PO TABS: 75.0000 mg | ORAL_TABLET | Freq: Every day | ORAL | 0 refills | Status: DC

## 2021-01-07 MED ORDER — ASPIRIN 325 MG PO TBEC: 325.0000 mg | DELAYED_RELEASE_TABLET | Freq: Every day | ORAL | 0 refills | Status: DC

## 2021-01-07 MED ORDER — ATORVASTATIN CALCIUM 10 MG PO TABS
20.0000 mg | ORAL_TABLET | Freq: Every day | ORAL | Status: DC
  Administered 2021-01-07 – 2021-01-08 (×2): 20 mg via ORAL
  Filled 2021-01-07 (×2): qty 2

## 2021-01-07 MED ORDER — ACETAMINOPHEN 325 MG PO TABS: 650.0000 mg | ORAL_TABLET | ORAL | 0 refills | Status: DC | PRN

## 2021-01-07 MED ORDER — ASPIRIN EC 325 MG PO TBEC
325.0000 mg | DELAYED_RELEASE_TABLET | Freq: Every day | ORAL | Status: DC
  Administered 2021-01-08: 325 mg via ORAL
  Filled 2021-01-07: qty 1

## 2021-01-07 MED ORDER — ATORVASTATIN CALCIUM 20 MG PO TABS: 20.0000 mg | ORAL_TABLET | Freq: Every day | ORAL | 3 refills | Status: AC

## 2021-01-07 NOTE — Progress Notes (Addendum)
STROKE TEAM PROGRESS NOTE   ATTENDING NOTE: I reviewed above note and agree with the assessment and plan. Pt was seen and examined.   87 year old female with history of smoking, hypertension, cervical DDD, bilateral DVTs admitted for bilateral hand weakness, numbness and the left leg weakness.  However patient only complaint to be that she had bilateral hand palmar side of finger numbness and denies any leg weakness.  CT no acute abnormality, CTA head and neck right M1 high-grade stenosis.  MRI showed right MCA territory scattered infarcts.  MRI C-spine showed C3/4 severe spinal stenosis and mild spinal cord compression.  EF 60 to 65%, LDL 53, A1c pending, UDS negative.  Creatinine 1.00.  On exam, no family at bedside, patient awake alert, orientated x3, mild hard of hearing but no aphasia, mild dysarthria, able to name and repeat, follow simple commands.  Cranial nerve intact, bilateral upper and lower extremity muscle strength symmetrical.  Bilateral palmar side of fingers decreased sensation, right more than left.  Otherwise sensation symmetrical.  Finger-to-nose intact.  Etiology for patient stroke likely due to right distal MCA high-grade stenosis, large vessel disease.  Recommend aspirin 325 and Plavix 75 DAPT for 3 months and then aspirin alone.  Continue Lipitor 81 given advanced age and LDL at the goal.  Regarding her bilateral finger sensation deficit, it is likely due to severe spinal stenosis and mild spinal cord compression.  Neurosurgery on board, recommend no surgical intervention in the follow-up as outpatient.  We will also recommend EMG with neurology outpatient follow-up.  PT/OT recommend outpatient PT/OT.  For detailed assessment and plan, please refer to above as I have made changes wherever appropriate.   I discussed with Dr. Tyrell Antonio. I spent  35 minutes in total face-to-face time with the patient, more than 50% of which was spent in counseling and coordination of care, reviewing  test results, images and medication, and discussing the diagnosis, treatment plan and potential prognosis. This patient's care requiresreview of multiple databases, neurological assessment, discussion with family, other specialists and medical decision making of high complexity. I had long discussion with daughter [name] over the phone, updated pt current condition, treatment plan and potential prognosis, and answered all the questions.  She expressed understanding and appreciation.   Neurology will sign off. Please call with questions. Pt will follow up with stroke clinic NP at [facility] in about 4 weeks. Thanks for the consult.   Hayley Hawking, MD PhD Stroke Neurology 01/07/2021 6:22 PM    INTERVAL HISTORY 87 year old right handed black female with hisotry of hpertension, dvt at both legs, and current smoking history presents with a 4 day history of bilateral hand weakness and numbness as well as left lower exrtremity weakness. Neuroimaging finds an embolic appearing right  mca territory infarction in the right parietal lobe/ right posterior frontal lobe secondary to a right MCA high grade stenosis, as well as HNP at the C3/4 level.   With respect to her HNP She has been seen by neurosurgery with no plans for surgery or follow up.  She is on aspirin and plavix for her new stroke.   Her  daughter is at the bedside.  Case manager called to  bedside to answer daughter questions   Vitals:   01/06/21 2357 01/07/21 0351 01/07/21 0900 01/07/21 1306  BP: 138/63 (!) 130/53 (!) 151/64 (!) 143/61  Pulse: 67 (!) 56 (!) 59 63  Resp: 18 19 18 18   Temp: 98.1 F (36.7 C) 98 F (36.7 C)  98 F (36.7 C) 97.8 F (36.6 C)  TempSrc: Oral Oral Oral Oral  SpO2: 97% 100% 100% 100%  Weight:      Height:       CBC:  Recent Labs  Lab 01/06/21 0037 01/07/21 0417  WBC 6.0 4.6  NEUTROABS 3.3 2.2  HGB 13.4 12.8  HCT 40.8 38.2  MCV 96.2 96.0  PLT 217 702   Basic Metabolic Panel:  Recent Labs  Lab  01/06/21 0037 01/07/21 0417  NA 139 136  K 3.6 3.8  CL 106 106  CO2 25 23  GLUCOSE 123* 105*  BUN 9 6*  CREATININE 0.85 1.00  CALCIUM 9.3 8.6*    Lipid Panel:  Recent Labs  Lab 01/07/21 0417  CHOL 131  TRIG 51  HDL 68  CHOLHDL 1.9  VLDL 10  LDLCALC 53    HgbA1c: No results for input(s): HGBA1C in the last 168 hours. Urine Drug Screen:  Recent Labs  Lab 01/06/21 0103  LABOPIA NONE DETECTED  COCAINSCRNUR NONE DETECTED  LABBENZ NONE DETECTED  AMPHETMU NONE DETECTED  THCU NONE DETECTED  LABBARB NONE DETECTED    Alcohol Level  Recent Labs  Lab 01/06/21 0037  ETH <10    IMAGING past 24 hours MR BRAIN WO CONTRAST  Result Date: 01/06/2021 CLINICAL DATA:  Neuro deficit, acute, stroke suspected. EXAM: MRI HEAD WITHOUT CONTRAST TECHNIQUE: Multiplanar, multiecho pulse sequences of the brain and surrounding structures were obtained without intravenous contrast. COMPARISON:  CT studies same day. FINDINGS: Brain: Diffusion imaging shows extensive patchy infarction in the right parietal lobe and to a lesser extent the posterior frontal lobe, consistent with embolic infarctions in the right MCA territory. No mass effect or hemorrhage. Elsewhere, chronic small-vessel ischemic changes affect pons. No focal abnormality affects the cerebellum. Cerebral hemispheres show extensive chronic small-vessel ischemic changes throughout the white matter. No hydrocephalus or extra-axial collection. Vascular: Major vessels at the base of the brain show flow. Skull and upper cervical spine: Negative Sinuses/Orbits: Mucosal inflammatory changes throughout the paranasal sinuses. Hypodense scratch that hypointense material within the sphenoid sinus that could indicate fungal sinusitis. Other: None IMPRESSION: Extensive patchy infarction in the right parietal lobe and to a lesser extent the posterior frontal lobe consistent with embolic infarction in the right MCA territory. No mass effect or hemorrhage.  Extensive chronic small-vessel ischemic changes elsewhere, especially the cerebral hemispheric white matter. Sinus inflammatory disease with widespread mucosal thickening. Hypointense material filling the sphenoid sinus suggests fungal sinusitis. Electronically Signed   By: Nelson Chimes M.D.   On: 01/06/2021 19:26   MR Cervical Spine Wo Contrast  Result Date: 01/06/2021 CLINICAL DATA:  Spinal stenosis. EXAM: MRI CERVICAL SPINE WITHOUT CONTRAST TECHNIQUE: Multiplanar, multisequence MR imaging of the cervical spine was performed. No intravenous contrast was administered. COMPARISON:  CT same day. FINDINGS: The study suffers from considerable motion degradation. Alignment: Straightening of the normal cervical lordosis. Vertebrae: No fracture or focal bone lesion. Cord: See below regarding stenosis. Posterior Fossa, vertebral arteries, paraspinal tissues: See results of brain MRI. Soft tissues of the neck appear unremarkable. Disc levels: Foramen magnum is widely patent.  C1-2 and C2-3 are unremarkable. C3-4: Central disc herniation effaces the ventral subarachnoid space and indents the cord. AP diameter of the canal in the midline 6.2 mm. Early abnormal T2 signal within the cord. No foraminal encroachment. C4-5: Small central disc herniation effaces the ventral subarachnoid space but does not deform the cord. AP diameter of the canal 8.2 mm. No foraminal stenosis.  C5-6: Shallow disc protrusion indents the ventral subarachnoid space but does not affect cord. AP diameter of the canal 11 mm. No foraminal stenosis. C6-7: Mild noncompressive disc bulge. C7-T1: Normal interspace. 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 AP diameter of the canal 6.2 mm. Early abnormal T2 signal within the cord. Small central disc herniation at C4-5 indents the subarachnoid space but does not deform the cord or cause compressive stenosis. Non-compressive  disc bulges at C5-6 and C6-7. Electronically Signed   By: Nelson Chimes M.D.   On: 01/06/2021 19:39    PHYSICAL EXAM  Mental Status: Patient is awake, alert, oriented to person, place, month, year, and situation. Patient is able to give a clear and coherent history. No signs of aphasia, but there is neglect of double simultaneous stimuli on the left (vision as well as sensory) Cranial Nerves: II: Visual Fields are full. Pupils are anisocoric, right 1.5 mm to 1 mm, left 4 mm to 2 mm III,IV, VI: EOMI without ptosis or diploplia.  V: Facial sensation is symmetric to temperature VII: Facial movement is notable for mild left facial droop, baseline per family.  VIII: hearing is intact to voice X: Uvula elevates symmetrically XI: Shoulder shrug is symmetric. XII: tongue is midline without atrophy or fasciculations.  Motor: There is pronation without drift of the left upper extremity Tone is normal. Bulk is normal. 5/5 strength was present in all four extremities, except the following                                      Right/left Finger extension  4+/4 Wrist extension  4+/4+ Hip flexion   4 -/4 Knee flexion   5/4+ Sensory: Sensation is symmetric to light touch and temperature in the arms and legs, but there is numbness in the bilateral hands, worst at the fingertips.  Vibration and proprioception not tested due to code stroke activation Deep Tendon Reflexes: 3+ and symmetric in the biceps, 3 beats of clonus at the patella and 4 beats of clonus at the bilateral ankles.  Plantars: Toes are downgoing bilaterally.  Cerebellar: FNF and HKS are intact bilaterally  ASSESSMENT/PLAN Ms. KASHVI PREVETTE is a 87 y.o. female with history of  ongoing smoking (1/3 PPD currently), hypertension, degenerative disc disease of the C-spine and L-spine, right upper extremity carpal tunnel syndrome, prior DVTs (bilateral legs), presenting with weakness at bilateral hands and left leg weakness.  MRI brain shows  stroke at right MCA territory, probably   from a right [facility] A stenosis, as well as HNP at C3/4.   C-spine compression due to disc herniation Pt complaining of lost sensation of b/l palmar side of fingers, right more than left. Not fit to the stroke picture MRI C-spine showed C3-4 effaces the ventral subarachnoid space and indents the ventral cord slightly NSG consulted - no intervention recommended - follow up as outpt Will follow up with neurology as outpt for EMG PT/OT  Stroke:  right mca territory infarct embolic secondary to large vessel disease with high grade stenosis of right M1 CT head No acute abnormality.  Small vessel disease. Atrophy.  ASPECTS 10.     CTA head & neck: 1. Negative CTA for large vessel occlusion. 2. Severe high-grade stenosis at the distal right M1 segment/right MCA bifurcation. 3. Relatively minor atheromatous disease for patient age elsewhere  about the major arterial vasculature of the head and neck. No other hemodynamically significant or correctable stenosis. 4. Chronic paranasal sinusitis.  MRI BRAIN:  Extensive patchy infarction in the right parietal lobe and to a lesser extent the posterior frontal lobe consistent with embolic infarction in the right MCA territory. No mass effect or hemorrhage.  Extensive chronic small-vessel ischemic changes elsewhere, especially the cerebral hemispheric white matter. Sinus inflammatory disease with widespread mucosal thickening. Hypointense material filling the sphenoid sinus suggests fungal sinusitis.    2D Echo  done and results are pending    LDL 53 HgbA1c No results found for requested labs within last 26280 hours. VTE prophylaxis - scd     Diet   Diet regular Room service appropriate? Yes; Fluid consistency: Thin   No antithrombotic prior to admission, now on aspirin 325 mg daily and clopidogrel 75 mg daily for 3 months and then ASA alone given high grade intracranial stenosis.  Therapy recommendations:  outpt PT/  supervision for mobility. Needs 3 in 1 walker.  Disposition:  home with PT. Probable discharge in am   Hypertension Home meds:  lisinopril Stable Permissive hypertension (OK if < 220/120) but gradually normalize in 5-7 days Long-term BP goal normotensive  Hyperlipidemia Home meds:  none ,   LDL 53, goal < 70 Add lipitor 20mg   Continue statin at discharge    CBGs No results for input(s): GLUCAP in the last 72 hours.  SSI  Other Stroke Risk Factors  Advanced Age >/= 38   Cigarette smoker advised to stop smoking  Obesity, Body mass index is 32.58 kg/m., BMI >/= 30 associated with increased stroke risk, recommend weight loss, diet and exercise as appropriate     Family hx stroke   Other Active Problems    Hospital day # 0     To contact Stroke Continuity provider, please refer to [email or web address] After hours, contact General Neurology

## 2021-01-07 NOTE — Progress Notes (Signed)
Patient suffers from stroke which impairs their ability to perform daily activities like bathing and grooming in the home.  A walker will not resolve  issue with performing activities of daily living. A wheelchair will allow patient to safely perform daily activities. Patient is not able to propel themselves in the home using a standard weight wheelchair due to general weakness. Patient can self propel in the lightweight wheelchair. Length of need Lifetime.  Accessories: elevating leg rests (ELRs), wheel locks, extensions and anti-tippers.

## 2021-01-07 NOTE — Plan of Care (Signed)
  Problem: Education: Goal: Knowledge of General Education information will improve Description: Including pain rating scale, medication(s)/side effects and non-pharmacologic comfort measures Outcome: Progressing   Problem: Health Behavior/Discharge Planning: Goal: Ability to manage health-related needs will improve Outcome: Progressing   Problem: Clinical Measurements: Goal: Ability to maintain clinical measurements within normal limits will improve Outcome: Progressing Goal: Will remain free from infection Outcome: Progressing Goal: Diagnostic test results will improve Outcome: Progressing Goal: Respiratory complications will improve Outcome: Progressing Goal: Cardiovascular complication will be avoided Outcome: Progressing   Problem: Activity: Goal: Risk for activity intolerance will decrease Outcome: Progressing   Problem: Coping: Goal: Level of anxiety will decrease Outcome: Progressing   Problem: Elimination: Goal: Will not experience complications related to bowel motility Outcome: Progressing Goal: Will not experience complications related to urinary retention Outcome: Progressing   Problem: Pain Managment: Goal: General experience of comfort will improve Outcome: Progressing   Problem: Safety: Goal: Ability to remain free from injury will improve Outcome: Progressing   Problem: Skin Integrity: Goal: Risk for impaired skin integrity will decrease Outcome: Progressing   Problem: Education: Goal: Knowledge of disease or condition will improve Outcome: Progressing Goal: Knowledge of secondary prevention will improve Outcome: Progressing Goal: Knowledge of patient specific risk factors addressed and post discharge goals established will improve Outcome: Progressing   Problem: Coping: Goal: Will verbalize positive feelings about self Outcome: Progressing Goal: Will identify appropriate support needs Outcome: Progressing   Problem: Health  Behavior/Discharge Planning: Goal: Ability to manage health-related needs will improve Outcome: Progressing   Problem: Self-Care: Goal: Ability to participate in self-care as condition permits will improve Outcome: Progressing Goal: Verbalization of feelings and concerns over difficulty with self-care will improve Outcome: Progressing Goal: Ability to communicate needs accurately will improve Outcome: Progressing   Problem: Nutrition: Goal: Risk of aspiration will decrease Outcome: Progressing Goal: Dietary intake will improve Outcome: Progressing   Problem: Intracerebral Hemorrhage Tissue Perfusion: Goal: Complications of Intracerebral Hemorrhage will be minimized Outcome: Progressing   Problem: Ischemic Stroke/TIA Tissue Perfusion: Goal: Complications of ischemic stroke/TIA will be minimized Outcome: Progressing   Problem: Spontaneous Subarachnoid Hemorrhage Tissue Perfusion: Goal: Complications of Spontaneous Subarachnoid Hemorrhage will be minimized Outcome: Progressing

## 2021-01-07 NOTE — Evaluation (Signed)
Physical Therapy Evaluation Patient Details Name: [name] MRN: [id number] DOB: 10/19/1928 Today's Date: 01/07/2021  History of Present Illness  87 y.o.  female presents to ED 9/20 after having 2 falls at home. c/o bilateral hand numbness and weakness and has progressed to L LE weakness. Found to have pulse 55-67, blood pressures elevated up to 207/70, and all other vital signs maintained as well as decreased L sided sensation and strength, and L sided dysmetria CT scan of the head and cervical spine noted chronic atrophy and ischemic changes with no acute abnormality noted and multilevel degenerative changes of the cervical spine. CT angiogram of the head and neck note severe high-grade stenosis of the distal right M1 segment/right MCA bifurcation. Labs significant for INR 1.3, and all other labs relatively within normal limits. Admitted for treatment of  simultaneous likely cervical spine compression as of 9/19 (with symptoms relatively stable), and a superimposed right MCA stroke  PMH: hypertension, DVT of bilateral legs, and arthritis  Clinical Impression  PTA pt living with daughter in single story home with level entry. Pt ambulating with cane in the community and independent in ADLs, daughter assists with iADLs. Pt limited in safe mobility by decreased strength, sensation and coordination in bilateral hands as well as little to no sensation on L UE and LE which impedes her coordination and proprioception. Pt is mod I for bed mobility and min A for transfers and ambulation with RW. Will take cane to trial in next session as pt with difficulty managing RW on L. PT recommending Outpatient Neuro PT for maximal neurological recovery at discharge. PT will continue to follow acutely.        Recommendations for follow up therapy are one component of a multi-disciplinary discharge planning process, led by the attending physician.  Recommendations may be updated based on patient status, additional  functional criteria and insurance authorization.  Follow Up Recommendations Outpatient PT;Supervision for mobility/OOB (Outpatient Neuro)    Equipment Recommendations  3in1 (PT)       Precautions / Restrictions Precautions Precautions: Fall Restrictions Weight Bearing Restrictions: No      Mobility  Bed Mobility Overal bed mobility: Modified Independent             General bed mobility comments: HoB elevated    Transfers Overall transfer level: Needs assistance Equipment used: Rolling walker (2 wheeled) Transfers: Sit to/from Stand Sit to Stand: Min assist         General transfer comment: good power up with min A needed for steadying  Ambulation/Gait Ambulation/Gait assistance: Min assist Gait Distance (Feet): 40 Feet Assistive device: Rolling walker (2 wheeled) Gait Pattern/deviations: Step-through pattern;Decreased step length - right;Decreased step length - left;Trunk flexed Gait velocity: slowed Gait velocity interpretation: <1.31 ft/sec, indicative of household ambulator General Gait Details: min A for RW management and steadying     Modified Rankin (Stroke Patients Only) Modified Rankin (Stroke Patients Only) Pre-Morbid Rankin Score: No symptoms Modified Rankin: Slight disability     Balance Overall balance assessment: Needs assistance Sitting-balance support: No upper extremity supported;Feet supported Sitting balance-Leahy Scale: Good     Standing balance support: Bilateral upper extremity supported Standing balance-Leahy Scale: Poor Standing balance comment: Reliant on BUE to maintain balance                             Pertinent Vitals/Pain Pain Assessment: No/denies pain    Home Living Family/patient expects to be discharged to::  Private residence Living Arrangements: Children Available Help at Discharge: Family;Available 24 hours/day Type of Home: House       Home Layout: One level Home Equipment: [place] - 2  wheels;Cane - quad;Cane - single point;Bedside commode      Prior Function Level of Independence: Independent with assistive device(s)         Comments: Pt used a cane at baseline, reports independence, however family reported that she has been falling more frequently this past year.     Hand Dominance   Dominant Hand: Right    Extremity/Trunk Assessment   Upper Extremity Assessment Upper Extremity Assessment: Defer to OT evaluation RUE Deficits / Details: Grip strength 3+/5, elbow flex 4+/5, shoulder flex 4-/5, reports difficulty holding on to anything RUE Sensation: decreased light touch;decreased proprioception RUE Coordination: decreased fine motor LUE Deficits / Details: Grip strength 3/5, elbow flex 4/5, shoulder flex 4-/5, reports difficulty holding on to anything LUE Sensation: decreased light touch;decreased proprioception LUE Coordination: decreased fine motor    Lower Extremity Assessment Lower Extremity Assessment: Defer to PT evaluation RLE Deficits / Details: ROM WL, strength grossly 4+/5 RLE Sensation: WNL RLE Coordination: WNL LLE Deficits / Details: ROM WFL, strength grossly 4/5 LLE Sensation: decreased light touch;decreased proprioception (no sensation noted on L side with eyes closed) LLE Coordination: decreased fine motor    Cervical / Trunk Assessment Cervical / Trunk Assessment: Kyphotic  Communication   Communication: No difficulties  Cognition Arousal/Alertness: Awake/alert Behavior During Therapy: WFL for tasks assessed/performed Overall Cognitive Status: Within Functional Limits for tasks assessed                                        General Comments General comments (skin integrity, edema, etc.): VSS on RA        Assessment/Plan    PT Assessment Patient needs continued PT services  PT Problem List Decreased coordination;Impaired sensation;Decreased strength;Decreased knowledge of use of DME       PT Treatment  Interventions DME instruction;Gait training;Functional mobility training;Therapeutic activities;Therapeutic exercise;Balance training;Cognitive remediation;Patient/family education    PT Goals (Current goals can be found in the Care Plan section)  Acute Rehab PT Goals Patient Stated Goal: To go home PT Goal Formulation: With patient/family Time For Goal Achievement: 01/21/21 Potential to Achieve Goals: Fair    Frequency Min 4X/week    AM-PAC PT "6 Clicks" Mobility  Outcome Measure Help needed turning from your back to your side while in a flat bed without using bedrails?: None Help needed moving from lying on your back to sitting on the side of a flat bed without using bedrails?: None Help needed moving to and from a bed to a chair (including a wheelchair)?: A Little Help needed standing up from a chair using your arms (e.g., wheelchair or bedside chair)?: A Little Help needed to walk in hospital room?: A Little Help needed climbing 3-5 steps with a railing? : A Lot 6 Click Score: 19    End of Session Equipment Utilized During Treatment: Gait belt Activity Tolerance: Patient tolerated treatment well Patient left: in chair;with call bell/phone within reach;with chair alarm set;with family/visitor present Nurse Communication: Mobility status PT Visit Diagnosis: Hemiplegia and hemiparesis;Other symptoms and signs involving the nervous system (R29.898);Difficulty in walking, not elsewhere classified (R26.2) Hemiplegia - Right/Left: Left Hemiplegia - dominant/non-dominant: Non-dominant Hemiplegia - caused by: Cerebral infarction    Time: 0177-9390 PT Time Calculation (min) (  ACUTE ONLY): 9 min   Charges:   PT Evaluation $PT Eval Moderate Complexity: 1 Mod PT Treatments $Gait Training: 8-22 mins        Evey Mcmahan B. Migdalia Dk PT, DPT Acute Rehabilitation Services Pager [phone] Office [phone]   [place] 01/07/2021, 1:27 PM

## 2021-01-07 NOTE — Consult Note (Signed)
Reason for Consult:Bilateral hand weakness, spinal cord compression Referring Physician: Elmarie Shiley, MD   HPI: [name] is a 87 y.o. right-handed female with a PmHx significant for tobacco abuse, HTN, DDD of C-spine and L-spine, right CTS, prior DVTs (bilateral legs). She reports that she was in her usual state of health Monday afternoon. About 4 PM when she went to get up she had trouble holding her cane.  She subsequently noticed bilateral hand weakness. Tuesday morning, she had 2 falls after which she noticed LLE weakness and presented to the ED for further evaluation.  She denies any focal neurological signs or symptoms prior to 9/19. She denies cervical pain. 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 She was unable to receive tPA due to being out of the window and was not a candidate for thrombectomy. Cervical imaging revealed an HNP at the C3/4 level and subsequently led to neurosurgical consultation.       Past Medical History:  Diagnosis Date   Allergic rhinitis    Arthritis    Carpal tunnel syndrome    Deep venous thrombosis (HCC)    bilateral legs   Degenerative arthritis    right knee   Hypertension    Lumbar degenerative disc disease    Lump or mass in breast    Paresthesia    Syncope 05/24/2013   Vitamin D deficiency     Past Surgical History:  Procedure Laterality Date   ABDOMINAL HYSTERECTOMY     CARPAL TUNNEL RELEASE Right    CATARACT EXTRACTION Bilateral    HEMORROIDECTOMY     IVC Filter     TOTAL HIP ARTHROPLASTY Right    ULNAR NERVE TRANSPOSITION Right     Family History  Problem Relation Age of Onset   Diabetes Mother    Kidney failure Father    Cancer - Lung Brother    Cancer - Prostate Brother    Kidney failure Son     Social History:  reports that she has been smoking. She has a 300.00 pack-year smoking history. She has never used smokeless tobacco. She reports current alcohol use. She reports that she does not use  drugs.  Allergies:  Allergies  Allergen Reactions   Codeine Other (See Comments)    Passed out Pass out Passed out   Penicillins Other (See Comments)    Passed out Has patient had a PCN reaction causing immediate rash, facial/tongue/throat swelling, SOB or lightheadedness with hypotension: Yes Has patient had a PCN reaction causing severe rash involving mucus membranes or skin necrosis: Unk Has patient had a PCN reaction that required hospitalization: No Has patient had a PCN reaction occurring within the last 10 years: No If all of the above answers are "NO", then may proceed with Cephalosporin use.  Pass out Passed out Has patient had a PCN reaction causing immediate rash, facial/tongue/throat swelling, SOB or lightheadedness with hypotension: Yes Has patient had a PCN reaction causing severe rash involving mucus membranes or skin necrosis: Unk Has patient had a PCN reaction that required hospitalization: No Has patient had a PCN reaction occurring within the last 10 years: No If all of the above answers are "NO", then may proceed with Cephalosporin use.    Medications: I have reviewed the patient's current medications.  Results for orders placed or performed during the hospital encounter of 01/05/21 (from the past 48 hour(s))  CBC with Differential/Platelet     Status: None   Collection Time: 01/06/21 12:37 AM  Result Value Ref Range   WBC 6.0 4.0 - 10.5 K/uL   RBC 4.24 3.87 - 5.11 MIL/uL   Hemoglobin 13.4 12.0 - 15.0 g/dL   HCT 40.8 36.0 - 46.0 %   MCV 96.2 80.0 - 100.0 fL   MCH 31.6 26.0 - 34.0 pg   MCHC 32.8 30.0 - 36.0 g/dL   RDW 14.1 11.5 - 15.5 %   Platelets 217 150 - 400 K/uL   nRBC 0.0 0.0 - 0.2 %   Neutrophils Relative % 56 %   Neutro Abs 3.3 1.7 - 7.7 K/uL   Lymphocytes Relative 32 %   Lymphs Abs 1.9 0.7 - 4.0 K/uL   Monocytes Relative 9 %   Monocytes Absolute 0.6 0.1 - 1.0 K/uL   Eosinophils Relative 3 %   Eosinophils Absolute 0.2 0.0 - 0.5 K/uL    Basophils Relative 0 %   Basophils Absolute 0.0 0.0 - 0.1 K/uL   Immature Granulocytes 0 %   Abs Immature Granulocytes 0.02 0.00 - 0.07 K/uL    Comment: Performed at [facility], [place], [place] [address]  Comprehensive metabolic panel     Status: Abnormal   Collection Time: 01/06/21 12:37 AM  Result Value Ref Range   Sodium 139 135 - 145 mmol/L   Potassium 3.6 3.5 - 5.1 mmol/L   Chloride 106 98 - 111 mmol/L   CO2 25 22 - 32 mmol/L   Glucose, Bld 123 (H) 70 - 99 mg/dL    Comment: Glucose reference range applies only to samples taken after fasting for at least 8 hours.   BUN 9 8 - 23 mg/dL   Creatinine, Ser 0.85 0.44 - 1.00 mg/dL   Calcium 9.3 8.9 - 10.3 mg/dL   Total Protein 6.6 6.5 - 8.1 g/dL   Albumin 3.9 3.5 - 5.0 g/dL   AST 11 (L) 15 - 41 U/L   ALT 8 0 - 44 U/L   Alkaline Phosphatase 52 38 - 126 U/L   Total Bilirubin 0.9 0.3 - 1.2 mg/dL   GFR, Estimated >60 >60 mL/min    Comment: (NOTE) Calculated using the CKD-EPI Creatinine Equation (2021)    Anion gap 8 5 - 15    Comment: Performed at [facility], [address] [place], [place] [address]  Resp Panel by RT-PCR (Flu A&B, Covid) Nasopharyngeal Swab     Status: None   Collection Time: 01/06/21 12:37 AM   Specimen: Nasopharyngeal Swab; Nasopharyngeal(NP) swabs in vial transport medium  Result Value Ref Range   SARS Coronavirus 2 by RT PCR NEGATIVE NEGATIVE    Comment: (NOTE) SARS-CoV-2 target nucleic acids are NOT DETECTED.  The SARS-CoV-2 RNA is generally detectable in upper respiratory specimens during the acute phase of infection. The lowest concentration of SARS-CoV-2 viral copies this assay can detect is 138 copies/mL. A negative result does not preclude SARS-Cov-2 infection and should not be used as the sole basis for treatment or other patient management decisions. A negative result may occur with  improper specimen collection/handling, submission of  specimen other than nasopharyngeal swab, presence of viral mutation(s) within the areas targeted by this assay, and inadequate number of viral copies(<138 copies/mL). A negative result must be combined with clinical observations, patient history, and epidemiological information. The expected result is Negative.  Fact Sheet for Patients:  EntrepreneurPulse.com.au  Fact Sheet for Healthcare Providers:  IncredibleEmployment.be  This test is no t yet approved or cleared by the [place] and  has been authorized for detection and/or diagnosis of SARS-CoV-2 by FDA under an Emergency Use Authorization (EUA). This EUA will remain  in effect (meaning this test can be used) for the duration of the COVID-19 declaration under Section 564(b)(1) of the Act, 21 U.S.C.section 360bbb-3(b)(1), unless the authorization is terminated  or revoked sooner.       Influenza A by PCR NEGATIVE NEGATIVE   Influenza B by PCR NEGATIVE NEGATIVE    Comment: (NOTE) The Xpert Xpress SARS-CoV-2/FLU/RSV plus assay is intended as an aid in the diagnosis of influenza from Nasopharyngeal swab specimens and should not be used as a sole basis for treatment. Nasal washings and aspirates are unacceptable for Xpert Xpress SARS-CoV-2/FLU/RSV testing.  Fact Sheet for Patients: EntrepreneurPulse.com.au  Fact Sheet for Healthcare Providers: IncredibleEmployment.be  This test is not yet approved or cleared by the [place] FDA and has been authorized for detection and/or diagnosis of SARS-CoV-2 by FDA under an Emergency Use Authorization (EUA). This EUA will remain in effect (meaning this test can be used) for the duration of the COVID-19 declaration under Section 564(b)(1) of the Act, 21 U.S.C. section 360bbb-3(b)(1), unless the authorization is terminated or revoked.  Performed at [facility], [address] [place], [place] [address]   Ethanol     Status: None   Collection Time: 01/06/21 12:37 AM  Result Value Ref Range   Alcohol, Ethyl (B) <10 <10 mg/dL    Comment: (NOTE) Lowest detectable limit for serum alcohol is 10 mg/dL.  For medical purposes only. Performed at [facility], [address] [place], [place] [address]   Protime-INR     Status: Abnormal   Collection Time: 01/06/21 12:37 AM  Result Value Ref Range   Prothrombin Time 16.3 (H) 11.4 - 15.2 seconds   INR 1.3 (H) 0.8 - 1.2    Comment: (NOTE) INR goal varies based on device and disease states. Performed at [facility], [address] [place], [place] [address]   APTT     Status: None   Collection Time: 01/06/21 12:37 AM  Result Value Ref Range   aPTT 29 24 - 36 seconds    Comment: Performed at [facility], [address], [place], [place] [address]  Urine rapid drug screen (hosp performed)     Status: None   Collection Time: 01/06/21  1:03 AM  Result Value Ref Range   Opiates NONE DETECTED NONE DETECTED   Cocaine NONE DETECTED NONE DETECTED   Benzodiazepines NONE DETECTED NONE DETECTED   Amphetamines NONE DETECTED NONE DETECTED   Tetrahydrocannabinol NONE DETECTED NONE DETECTED   Barbiturates NONE DETECTED NONE DETECTED    Comment: (NOTE) DRUG SCREEN FOR MEDICAL PURPOSES ONLY.  IF CONFIRMATION IS NEEDED FOR ANY PURPOSE, NOTIFY LAB WITHIN 5 DAYS.  LOWEST DETECTABLE LIMITS FOR URINE DRUG SCREEN Drug Class                     Cutoff (ng/mL) Amphetamine and metabolites    1000 Barbiturate and metabolites    200 Benzodiazepine                 883 Tricyclics and metabolites     300 Opiates and metabolites        300 Cocaine and metabolites        300 THC  50 Performed at [facility], [address] [place], [place] [address]   Urinalysis, Routine w reflex microscopic Urine, Clean Catch      Status: Abnormal   Collection Time: 01/06/21  1:03 AM  Result Value Ref Range   Color, Urine YELLOW YELLOW   APPearance CLEAR CLEAR   Specific Gravity, Urine 1.023 1.005 - 1.030   pH 5.5 5.0 - 8.0   Glucose, UA NEGATIVE NEGATIVE mg/dL   Hgb urine dipstick NEGATIVE NEGATIVE   Bilirubin Urine NEGATIVE NEGATIVE   Ketones, ur TRACE (A) NEGATIVE mg/dL   Protein, ur TRACE (A) NEGATIVE mg/dL   Nitrite NEGATIVE NEGATIVE   Leukocytes,Ua NEGATIVE NEGATIVE    Comment: Performed at [facility], [place], [place] [address]  Lipid panel     Status: None   Collection Time: 01/07/21  4:17 AM  Result Value Ref Range   Cholesterol 131 0 - 200 mg/dL   Triglycerides 51 <150 mg/dL   HDL 68 >40 mg/dL   Total CHOL/HDL Ratio 1.9 RATIO   VLDL 10 0 - 40 mg/dL   LDL Cholesterol 53 0 - 99 mg/dL    Comment:        Total Cholesterol/HDL:CHD Risk Coronary Heart Disease Risk Table                     Men   Women  1/2 Average Risk   3.4   3.3  Average Risk       5.0   4.4  2 X Average Risk   9.6   7.1  3 X Average Risk  23.4   11.0        Use the calculated Patient Ratio above and the CHD Risk Table to determine the patient's CHD Risk.        ATP III CLASSIFICATION (LDL):  <100     mg/dL   Optimal  100-129  mg/dL   Near or Above                    Optimal  130-159  mg/dL   Borderline  160-189  mg/dL   High  >190     mg/dL   Very High Performed at [facility] [address], [place], [place] [address]   CBC with Differential/Platelet     Status: None   Collection Time: 01/07/21  4:17 AM  Result Value Ref Range   WBC 4.6 4.0 - 10.5 K/uL   RBC 3.98 3.87 - 5.11 MIL/uL   Hemoglobin 12.8 12.0 - 15.0 g/dL   HCT 38.2 36.0 - 46.0 %   MCV 96.0 80.0 - 100.0 fL   MCH 32.2 26.0 - 34.0 pg   MCHC 33.5 30.0 - 36.0 g/dL   RDW 13.9 11.5 - 15.5 %   Platelets 186 150 - 400 K/uL   nRBC 0.0 0.0 - 0.2 %   Neutrophils Relative % 47 %   Neutro Abs 2.2 1.7 - 7.7 K/uL    Lymphocytes Relative 40 %   Lymphs Abs 1.8 0.7 - 4.0 K/uL   Monocytes Relative 9 %   Monocytes Absolute 0.4 0.1 - 1.0 K/uL   Eosinophils Relative 4 %   Eosinophils Absolute 0.2 0.0 - 0.5 K/uL   Basophils Relative 0 %   Basophils Absolute 0.0 0.0 - 0.1 K/uL   Immature Granulocytes 0 %   Abs Immature Granulocytes 0.02 0.00 - 0.07 K/uL    Comment: Performed at  [facility], [place] [address], [place], [place] [address]  Basic metabolic panel     Status: Abnormal   Collection Time: 01/07/21  4:17 AM  Result Value Ref Range   Sodium 136 135 - 145 mmol/L   Potassium 3.8 3.5 - 5.1 mmol/L   Chloride 106 98 - 111 mmol/L   CO2 23 22 - 32 mmol/L   Glucose, Bld 105 (H) 70 - 99 mg/dL    Comment: Glucose reference range applies only to samples taken after fasting for at least 8 hours.   BUN 6 (L) 8 - 23 mg/dL   Creatinine, Ser 1.00 0.44 - 1.00 mg/dL   Calcium 8.6 (L) 8.9 - 10.3 mg/dL   GFR, Estimated 54 (L) >60 mL/min    Comment: (NOTE) Calculated using the CKD-EPI Creatinine Equation (2021)    Anion gap 7 5 - 15    Comment: Performed at [facility] [address], [place], [place] [address]    CT Angio Head W or Wo Contrast  Result Date: 01/06/2021 CLINICAL DATA:  Initial evaluation for neuro deficit, stroke suspected. EXAM: CT ANGIOGRAPHY HEAD AND NECK TECHNIQUE: Multidetector CT imaging of the head and neck was performed using the standard protocol during bolus administration of intravenous contrast. Multiplanar CT image reconstructions and MIPs were obtained to evaluate the vascular anatomy. Carotid stenosis measurements (when applicable) are obtained utilizing NASCET criteria, using the distal internal carotid diameter as the denominator. CONTRAST:  69mL OMNIPAQUE IOHEXOL 350 MG/ML SOLN COMPARISON:  Head CT from 01/05/2021. FINDINGS: CTA NECK FINDINGS Aortic arch: Visualized aortic arch normal in caliber with normal branch pattern. Mild for age atheromatous change about the aortic  arch. No stenosis about the origin of the great vessels. Right carotid system: Right CCA tortuous proximally but is widely patent to the bifurcation. No significant atheromatous narrowing about the right carotid bulb. Right ICA widely patent without stenosis dissection or occlusion. Left carotid system: Left CCA tortuous proximally but is widely patent to the bifurcation. No significant atheromatous narrowing about the left carotid bulb. Left ICA widely patent distally without stenosis, dissection or occlusion. Vertebral arteries: Both vertebral arteries arise from the subclavian arteries. Left vertebral artery slightly dominant. No proximal subclavian artery stenosis. Vertebral arteries patent without stenosis dissection or occlusion. Skeleton: No visible acute osseous finding. No discrete or worrisome osseous lesions. Other neck: No other acute soft tissue abnormality within the neck. Few hypodense left thyroid nodules noted, largest of which measures 2.1 cm. In the setting of significant comorbidities or limited life expectancy, no follow-up recommended (ref: J Am Coll Radiol. 2015 Feb;12(2): 143-50).No other mass or adenopathy. Chronic paranasal sinus disease noted. Upper chest: Visualized upper chest demonstrates no acute finding. Review of the MIP images confirms the above findings CTA HEAD FINDINGS Anterior circulation: Petrous segments patent bilaterally. Mild for age atheromatous change within the carotid siphons without stenosis. A1 segments widely patent. Normal anterior communicating artery complex. Anterior cerebral arteries patent to their distal aspects without stenosis. Left M1 segment widely patent. Normal left MCA bifurcation. Distal left MCA branches widely patent and well perfused. Right M1 segment patent proximally. There is a severe high-grade stenosis at the distal right M1 segment/right MCA bifurcation (series 9, image 20). This is just beyond the takeoff of a small anterior temporal branch.  Right MCA branches patent distally. Posterior circulation: Both V4 segments patent to the vertebrobasilar junction without stenosis. Left PICA origin patent. Right PICA not seen. Basilar patent to its distal aspect  without stenosis. Superior cerebral arteries patent bilaterally. Left PCA supplied via the basilar. Right PCA supplied via the basilar as well as a robust right posterior communicating artery. Both PCAs patent to their distal aspects without stenosis. Venous sinuses: Grossly patent allowing for timing the contrast bolus. Anatomic variants: None significant.  No aneurysm. Review of the MIP images confirms the above findings IMPRESSION: 1. Negative CTA for large vessel occlusion. 2. Severe high-grade stenosis at the distal right M1 segment/right MCA bifurcation. 3. Relatively minor atheromatous disease for patient age elsewhere about the major arterial vasculature of the head and neck. No other hemodynamically significant or correctable stenosis. 4. Chronic paranasal sinusitis. Electronically Signed   By: Jeannine Boga M.D.   On: 01/06/2021 04:24   CT HEAD WO CONTRAST (5MM)  Result Date: 01/05/2021 CLINICAL DATA:  Recent falls today with headaches and neck pain, initial encounter EXAM: CT HEAD WITHOUT CONTRAST CT CERVICAL SPINE WITHOUT CONTRAST TECHNIQUE: Multidetector CT imaging of the head and cervical spine was performed following the standard protocol without intravenous contrast. Multiplanar CT image reconstructions of the cervical spine were also generated. COMPARISON:  09/09/2018 FINDINGS: CT HEAD FINDINGS Brain: No evidence of acute infarction, hemorrhage, hydrocephalus, extra-axial collection or mass lesion/mass effect. Chronic atrophic and ischemic changes are noted Vascular: No hyperdense vessel or unexpected calcification. Skull: Normal. Negative for fracture or focal lesion. Sinuses/Orbits: No acute finding. Other: None. CT CERVICAL SPINE FINDINGS Alignment: Stable straightening of  the normal cervical lordosis. Skull base and vertebrae: 7 cervical segments are well visualized. Vertebral body height is well maintained. Osteophytic changes are noted predominately from C5-C7. No acute fracture or acute facet abnormality is noted. Multilevel facet hypertrophic changes are seen. Soft tissues and spinal canal: Surrounding soft tissue structures are well visualized. No acute abnormality is noted. There are 2 adjacent hypodense lesions in the left lobe of the thyroid. The largest of these measures 2.0 cm. Upper chest: Visualized lung apices are within normal limits. Other: None IMPRESSION: CT of the head: Chronic atrophic and ischemic changes. No acute abnormality noted. CT of the cervical spine: Multilevel degenerative change. Hypodense thyroid nodules in the left lobe of the liver as described. No follow-up recommended unless clinically warranted (ref: J Am Coll Radiol. 2015 Feb;12(2): 143-50). Electronically Signed   By: Inez Catalina M.D.   On: 01/05/2021 22:28   CT Angio Neck W and/or Wo Contrast  Result Date: 01/06/2021 CLINICAL DATA:  Initial evaluation for neuro deficit, stroke suspected. EXAM: CT ANGIOGRAPHY HEAD AND NECK TECHNIQUE: Multidetector CT imaging of the head and neck was performed using the standard protocol during bolus administration of intravenous contrast. Multiplanar CT image reconstructions and MIPs were obtained to evaluate the vascular anatomy. Carotid stenosis measurements (when applicable) are obtained utilizing NASCET criteria, using the distal internal carotid diameter as the denominator. CONTRAST:  71mL OMNIPAQUE IOHEXOL 350 MG/ML SOLN COMPARISON:  Head CT from 01/05/2021. FINDINGS: CTA NECK FINDINGS Aortic arch: Visualized aortic arch normal in caliber with normal branch pattern. Mild for age atheromatous change about the aortic arch. No stenosis about the origin of the great vessels. Right carotid system: Right CCA tortuous proximally but is widely patent to the  bifurcation. No significant atheromatous narrowing about the right carotid bulb. Right ICA widely patent without stenosis dissection or occlusion. Left carotid system: Left CCA tortuous proximally but is widely patent to the bifurcation. No significant atheromatous narrowing about the left carotid bulb. Left ICA widely patent distally without stenosis, dissection or occlusion. Vertebral arteries: Both  vertebral arteries arise from the subclavian arteries. Left vertebral artery slightly dominant. No proximal subclavian artery stenosis. Vertebral arteries patent without stenosis dissection or occlusion. Skeleton: No visible acute osseous finding. No discrete or worrisome osseous lesions. Other neck: No other acute soft tissue abnormality within the neck. Few hypodense left thyroid nodules noted, largest of which measures 2.1 cm. In the setting of significant comorbidities or limited life expectancy, no follow-up recommended (ref: J Am Coll Radiol. 2015 Feb;12(2): 143-50).No other mass or adenopathy. Chronic paranasal sinus disease noted. Upper chest: Visualized upper chest demonstrates no acute finding. Review of the MIP images confirms the above findings CTA HEAD FINDINGS Anterior circulation: Petrous segments patent bilaterally. Mild for age atheromatous change within the carotid siphons without stenosis. A1 segments widely patent. Normal anterior communicating artery complex. Anterior cerebral arteries patent to their distal aspects without stenosis. Left M1 segment widely patent. Normal left MCA bifurcation. Distal left MCA branches widely patent and well perfused. Right M1 segment patent proximally. There is a severe high-grade stenosis at the distal right M1 segment/right MCA bifurcation (series 9, image 20). This is just beyond the takeoff of a small anterior temporal branch. Right MCA branches patent distally. Posterior circulation: Both V4 segments patent to the vertebrobasilar junction without stenosis. Left  PICA origin patent. Right PICA not seen. Basilar patent to its distal aspect without stenosis. Superior cerebral arteries patent bilaterally. Left PCA supplied via the basilar. Right PCA supplied via the basilar as well as a robust right posterior communicating artery. Both PCAs patent to their distal aspects without stenosis. Venous sinuses: Grossly patent allowing for timing the contrast bolus. Anatomic variants: None significant.  No aneurysm. Review of the MIP images confirms the above findings IMPRESSION: 1. Negative CTA for large vessel occlusion. 2. Severe high-grade stenosis at the distal right M1 segment/right MCA bifurcation. 3. Relatively minor atheromatous disease for patient age elsewhere about the major arterial vasculature of the head and neck. No other hemodynamically significant or correctable stenosis. 4. Chronic paranasal sinusitis. Electronically Signed   By: Jeannine Boga M.D.   On: 01/06/2021 04:24   CT Cervical Spine Wo Contrast  Result Date: 01/05/2021 CLINICAL DATA:  Recent falls today with headaches and neck pain, initial encounter EXAM: CT HEAD WITHOUT CONTRAST CT CERVICAL SPINE WITHOUT CONTRAST TECHNIQUE: Multidetector CT imaging of the head and cervical spine was performed following the standard protocol without intravenous contrast. Multiplanar CT image reconstructions of the cervical spine were also generated. COMPARISON:  09/09/2018 FINDINGS: CT HEAD FINDINGS Brain: No evidence of acute infarction, hemorrhage, hydrocephalus, extra-axial collection or mass lesion/mass effect. Chronic atrophic and ischemic changes are noted Vascular: No hyperdense vessel or unexpected calcification. Skull: Normal. Negative for fracture or focal lesion. Sinuses/Orbits: No acute finding. Other: None. CT CERVICAL SPINE FINDINGS Alignment: Stable straightening of the normal cervical lordosis. Skull base and vertebrae: 7 cervical segments are well visualized. Vertebral body height is well  maintained. Osteophytic changes are noted predominately from C5-C7. No acute fracture or acute facet abnormality is noted. Multilevel facet hypertrophic changes are seen. Soft tissues and spinal canal: Surrounding soft tissue structures are well visualized. No acute abnormality is noted. There are 2 adjacent hypodense lesions in the left lobe of the thyroid. The largest of these measures 2.0 cm. Upper chest: Visualized lung apices are within normal limits. Other: None IMPRESSION: CT of the head: Chronic atrophic and ischemic changes. No acute abnormality noted. CT of the cervical spine: Multilevel degenerative change. Hypodense thyroid nodules in the left lobe  of the liver as described. No follow-up recommended unless clinically warranted (ref: J Am Coll Radiol. 2015 Feb;12(2): 143-50). Electronically Signed   By: Inez Catalina M.D.   On: 01/05/2021 22:28   MR BRAIN WO CONTRAST  Result Date: 01/06/2021 CLINICAL DATA:  Neuro deficit, acute, stroke suspected. EXAM: MRI HEAD WITHOUT CONTRAST TECHNIQUE: Multiplanar, multiecho pulse sequences of the brain and surrounding structures were obtained without intravenous contrast. COMPARISON:  CT studies same day. FINDINGS: Brain: Diffusion imaging shows extensive patchy infarction in the right parietal lobe and to a lesser extent the posterior frontal lobe, consistent with embolic infarctions in the right MCA territory. No mass effect or hemorrhage. Elsewhere, chronic small-vessel ischemic changes affect pons. No focal abnormality affects the cerebellum. Cerebral hemispheres show extensive chronic small-vessel ischemic changes throughout the white matter. No hydrocephalus or extra-axial collection. Vascular: Major vessels at the base of the brain show flow. Skull and upper cervical spine: Negative Sinuses/Orbits: Mucosal inflammatory changes throughout the paranasal sinuses. Hypodense scratch that hypointense material within the sphenoid sinus that could indicate fungal  sinusitis. Other: None IMPRESSION: Extensive patchy infarction in the right parietal lobe and to a lesser extent the posterior frontal lobe consistent with embolic infarction in the right MCA territory. No mass effect or hemorrhage. Extensive chronic small-vessel ischemic changes elsewhere, especially the cerebral hemispheric white matter. Sinus inflammatory disease with widespread mucosal thickening. Hypointense material filling the sphenoid sinus suggests fungal sinusitis. Electronically Signed   By: Nelson Chimes M.D.   On: 01/06/2021 19:26   MR Cervical Spine Wo Contrast  Result Date: 01/06/2021 CLINICAL DATA:  Spinal stenosis. EXAM: MRI CERVICAL SPINE WITHOUT CONTRAST TECHNIQUE: Multiplanar, multisequence MR imaging of the cervical spine was performed. No intravenous contrast was administered. COMPARISON:  CT same day. FINDINGS: The study suffers from considerable motion degradation. Alignment: Straightening of the normal cervical lordosis. Vertebrae: No fracture or focal bone lesion. Cord: See below regarding stenosis. Posterior Fossa, vertebral arteries, paraspinal tissues: See results of brain MRI. Soft tissues of the neck appear unremarkable. Disc levels: Foramen magnum is widely patent.  C1-2 and C2-3 are unremarkable. C3-4: Central disc herniation effaces the ventral subarachnoid space and indents the cord. AP diameter of the canal in the midline 6.2 mm. Early abnormal T2 signal within the cord. No foraminal encroachment. C4-5: Small central disc herniation effaces the ventral subarachnoid space but does not deform the cord. AP diameter of the canal 8.2 mm. No foraminal stenosis. C5-6: Shallow disc protrusion indents the ventral subarachnoid space but does not affect cord. AP diameter of the canal 11 mm. No foraminal stenosis. C6-7: Mild noncompressive disc bulge. C7-T1: Normal interspace. IMPRESSION: Large central disc herniation at C3-4 effaces the ventral subarachnoid space and indents the ventral  cord slightly. AP diameter of the canal 6.2 mm. Early abnormal T2 signal within the cord. Small central disc herniation at C4-5 indents the subarachnoid space but does not deform the cord or cause compressive stenosis. Non-compressive disc bulges at C5-6 and C6-7. Electronically Signed   By: Nelson Chimes M.D.   On: 01/06/2021 19:39    ROS: Per HPI Blood pressure (!) 151/64, pulse (!) 59, temperature 98 F (36.7 C), temperature source Oral, resp. rate 18, height 5\' 8"  (1.727 m), weight 97.2 kg, SpO2 100 %.  Physical Exam: Patient is awake, A/O X 4, conversant, and in good spirits. They are in NAD and VSS. Speech is fluent and appropriate. MAEW. Right FE4+/ 5, Left FE 4/5. Bilateral WE 4+/5. Decreased sensation to pinprick  in her bilateral hands.  PERRLA. EOMI. CNs grossly intact Mild left facial droop, baseline per family.   Assessment/Plan: 87 year old female presenting with right MCA CVA was found to have an HNP at the C3/4 level. Cervical imaging reviewed. She has a large HNP at the C3/4 level that is causing severe spinal stenosis. She has bilateral hand weakness with no other radicular or myelopathic complaints. I do not feel that she is a good surgical candidate and nor do I recommend any surgical intervention. She does not need a cervical collar. She can follow up as an outpatient if she would like, although this is not necessary as there is no intervention to offer the patient. The patient and family were notified to contact our office if her symptoms worsen or if she has new myelopathic or radicular symptoms. Continue PT/OT. Continue supportive care. Call with any questions.   Marvis Moeller, DNP, NP-C 01/07/2021, 9:30 AM

## 2021-01-07 NOTE — Evaluation (Signed)
Occupational Therapy Evaluation Patient Details Name: [name] MRN: [id number] DOB: [birth year] Today's Date: 01/07/2021   History of Present Illness 87 y.o.  female presents to ED 9/20 after having 2 falls at home. c/o bilateral hand numbness and weakness and has progressed to L LE weakness. Found to have pulse 55-67, blood pressures elevated up to 207/70, and all other vital signs maintained as well as decreased L sided sensation and strength, and L sided dysmetria CT scan of the head and cervical spine noted chronic atrophy and ischemic changes with no acute abnormality noted and multilevel degenerative changes of the cervical spine. CT angiogram of the head and neck note severe high-grade stenosis of the distal right M1 segment/right MCA bifurcation. Labs significant for INR 1.3, and all other labs relatively within normal limits. Admitted for treatment of  simultaneous likely cervical spine compression as of 9/19 (with symptoms relatively stable), and a superimposed right MCA stroke  PMH: hypertension, DVT of bilateral legs, and arthritis   Clinical Impression   Pt admitted for concerns listed above. PTA pt reported that she was independent with all ADL's and IADL's, using a cane. At this time, pt presents with decreased peripheral vision on the L side, weakness in both hands, and balance deficits. Pt requiring use of RW at this time for safety, as well as min- max A for all ADL's. Pt will benefit from further therapy services to maximize her safety and return to independence. OT will follow acutely.     Recommendations for follow up therapy are one component of a multi-disciplinary discharge planning process, led by the attending physician.  Recommendations may be updated based on patient status, additional functional criteria and insurance authorization.   Follow Up Recommendations  Home health OT;Supervision/Assistance - 24 hour    Equipment Recommendations  Tub/shower bench     Recommendations for Other Services       Precautions / Restrictions Precautions Precautions: Fall Restrictions Weight Bearing Restrictions: No      Mobility Bed Mobility Overal bed mobility: Modified Independent             General bed mobility comments: HoB elevated    Transfers Overall transfer level: Needs assistance Equipment used: Rolling walker (2 wheeled) Transfers: Sit to/from Stand Sit to Stand: Min assist;Min guard         General transfer comment: 1st stand pt needed min A to power up, 2nd stand pt got up with min g. ADditionally, pt was able to hold a squat position over the toilet to use the bathroom.    Balance Overall balance assessment: Needs assistance Sitting-balance support: No upper extremity supported;Feet supported Sitting balance-Leahy Scale: Good     Standing balance support: Bilateral upper extremity supported Standing balance-Leahy Scale: Poor Standing balance comment: Reliant on BUE to maintain balance                           ADL either performed or assessed with clinical judgement   ADL Overall ADL's : Needs assistance/impaired Eating/Feeding: Moderate assistance;Sitting Eating/Feeding Details (indicate cue type and reason): With built up handle, pt able to complete self feedingwith set up Grooming: Minimal assistance;Sitting   Upper Body Bathing: Minimal assistance;Sitting   Lower Body Bathing: Maximal assistance;Sitting/lateral leans;Sit to/from stand   Upper Body Dressing : Min guard;Sitting   Lower Body Dressing: Moderate assistance;Maximal assistance;Sitting/lateral leans;Sit to/from stand   Toilet Transfer: Minimal assistance;Ambulation   Toileting- Clothing Manipulation and Hygiene: Moderate assistance;Sitting/lateral  lean;Sit to/from stand       Functional mobility during ADLs: Minimal assistance;Min guard;Rolling walker General ADL Comments: Pt requiring min-max A with all ADL's due to grip strength,  balance deficits, vision deficits, and weakness.     Vision Baseline Vision/History: 1 Wears glasses Ability to See in Adequate Light: 0 Adequate Patient Visual Report: No change from baseline Vision Assessment?: Yes Eye Alignment: Within Functional Limits Ocular Range of Motion: Within Functional Limits Alignment/Gaze Preference: Within Defined Limits Tracking/Visual Pursuits: Able to track stimulus in all quads without difficulty Visual Fields: Left visual field deficit Depth Perception: Undershoots Additional Comments: Pt functionally demonstrating difficulty with L visual field, as she could not find toilet paper in the bathroom or hot water handle at the sink.     Perception Perception Perception Tested?: No   Praxis Praxis Praxis tested?: Within functional limits    Pertinent Vitals/Pain Pain Assessment: No/denies pain     Hand Dominance Right   Extremity/Trunk Assessment Upper Extremity Assessment Upper Extremity Assessment: RUE deficits/detail;LUE deficits/detail RUE Deficits / Details: Grip strength 3+/5, elbow flex 4+/5, shoulder flex 4-/5, reports difficulty holding on to anything RUE Sensation: decreased light touch;decreased proprioception RUE Coordination: decreased fine motor LUE Deficits / Details: Grip strength 3/5, elbow flex 4/5, shoulder flex 4-/5, reports difficulty holding on to anything LUE Sensation: decreased light touch;decreased proprioception LUE Coordination: decreased fine motor   Lower Extremity Assessment Lower Extremity Assessment: Defer to PT evaluation RLE Deficits / Details: ROM WL, strength grossly 4+/5 RLE Sensation: WNL RLE Coordination: WNL LLE Deficits / Details: ROM WFL, strength grossly 4/5 LLE Sensation: decreased light touch;decreased proprioception (no sensation noted on L side with eyes closed)   Cervical / Trunk Assessment Cervical / Trunk Assessment: Kyphotic   Communication Communication Communication: No  difficulties   Cognition Arousal/Alertness: Awake/alert Behavior During Therapy: WFL for tasks assessed/performed Overall Cognitive Status: Within Functional Limits for tasks assessed                                     General Comments  VSS on RA    Exercises     Shoulder Instructions      Home Living Family/patient expects to be discharged to:: Private residence Living Arrangements: Children Available Help at Discharge: Family;Available 24 hours/day Type of Home: House       Home Layout: One level     Bathroom Shower/Tub: Teacher, early years/pre: Handicapped height Bathroom Accessibility: Yes How Accessible: Accessible via walker Home Equipment: [place] - 2 wheels;Cane - quad;Cane - single point;Bedside commode          Prior Functioning/Environment Level of Independence: Independent with assistive device(s)        Comments: Pt used a cane at baseline, reports independence, however family reported that she has been falling more frequently this past year.        OT Problem List: Decreased range of motion;Decreased strength;Decreased activity tolerance;Impaired balance (sitting and/or standing);Impaired vision/perception;Decreased coordination;Decreased safety awareness;Decreased knowledge of use of DME or AE;Pain      OT Treatment/Interventions: Self-care/ADL training;Therapeutic exercise;Energy conservation;DME and/or AE instruction;Therapeutic activities;Visual/perceptual remediation/compensation;Patient/family education;Balance training    OT Goals(Current goals can be found in the care plan section) Acute Rehab OT Goals Patient Stated Goal: To go home OT Goal Formulation: With patient/family Time For Goal Achievement: 01/21/21 Potential to Achieve Goals: Good ADL Goals Pt Will Perform Eating: with modified independence;with adaptive utensils;sitting  Pt Will Perform Grooming: with modified independence;with adaptive  equipment;sitting Pt Will Perform Lower Body Bathing: with min guard assist;sitting/lateral leans;sit to/from stand Pt Will Perform Lower Body Dressing: with min guard assist;sitting/lateral leans;sit to/from stand Pt Will Transfer to Toilet: with supervision;ambulating Pt Will Perform Toileting - Clothing Manipulation and hygiene: with min guard assist;sitting/lateral leans;sit to/from stand  OT Frequency: Min 2X/week   Barriers to D/C:            Co-evaluation              AM-PAC OT "6 Clicks" Daily Activity     Outcome Measure Help from another person eating meals?: A Little Help from another person taking care of personal grooming?: A Little Help from another person toileting, which includes using toliet, bedpan, or urinal?: A Lot Help from another person bathing (including washing, rinsing, drying)?: A Lot Help from another person to put on and taking off regular upper body clothing?: A Little Help from another person to put on and taking off regular lower body clothing?: A Lot 6 Click Score: 15   End of Session Equipment Utilized During Treatment: Gait belt;Rolling walker Nurse Communication: Mobility status  Activity Tolerance: Patient tolerated treatment well Patient left: in bed;with call bell/phone within reach;with family/visitor present  OT Visit Diagnosis: Unsteadiness on feet (R26.81);Other abnormalities of gait and mobility (R26.89);Muscle weakness (generalized) (M62.81);Repeated falls (R29.6)                Time: 4967-5916 OT Time Calculation (min): 41 min Charges:  OT General Charges $OT Visit: 1 Visit OT Evaluation $OT Eval Moderate Complexity: 1 Mod OT Treatments $Self Care/Home Management : 8-22 mins $Therapeutic Activity: 8-22 mins  Dauntae Derusha H., OTR/L Acute Rehabilitation  Kristel Durkee Elane Aleathea Pugmire 01/07/2021, 11:53 AM

## 2021-01-07 NOTE — Progress Notes (Signed)
PROGRESS NOTE    [name]  ZDG:[id number] DOB: [birth year] DOA: 01/05/2021 PCP: Nolene Ebbs, MD   Brief Narrative: 87 year old right-handed female with medical history significant for hypertension, DVT of bilateral leg not on anticoagulation who presented after falling twice at home. On admission she was found to have acute stroke.  MRI of the cervical spine showed large central disc herniation at C3-C4) effaces the ventral subarachnoid space and indents the ventral cord slightly.  Small central disc herniation at C4-C5, does not deform the cord COMPRESSIVE for stenosis.  Patient admitted for further evaluation of stroke.  Assessment & Plan:   Principal Problem:   CVA (cerebral vascular accident) ([place]) Active Problems:   Essential hypertension   Arthritis  1-Acute stroke: Right parietal lobe and posterior frontal lobe stroke MRI: Extensive patchy infarction in the right parietal lobe and to lesser extent to the posterior frontal lobe consistent with embolic infarct in the right MCA. CTA: Negative for large vessel occlusion.  Severe high-grade stenosis of the distal right M1 segment right MCA bifurcation. Echo: pending LDL: 53.  A1c pending Continue with aspirin and Plavix Follow neurology recommendation.  C3-C4 large central disc herniation, early abnormal T2 signal within the cord: Patient was evaluated by neurosurgery, patient is not a surgical candidate. No need for cervical collar.  Sinus inflammatory disease, hypointense material filling the sphenoid sinus suggest fungal sinusitis on MRI: Patient with mild symptoms.  Patient is not febrile, not leukopenic.  Discussed with ID no need for antifungal treatment  Hypertension: Permissive hypertension.  Hold lisinopril and hydrochlorothiazide today.  Estimated body mass index is 32.58 kg/m as calculated from the following:   Height as of this encounter: 5\' 8"  (1.727 m).   Weight as of this encounter: 97.2  kg.   DVT prophylaxis: Lovenox Code Status: Full code Family Communication: Care discussed with daughter who was at bedside Disposition Plan:  Status is: Inpatient  Remains inpatient appropriate because:IV treatments appropriate due to intensity of illness or inability to take PO  Dispo: The patient is from: Home              Anticipated d/c is to: Home              Patient currently is not medically stable to d/c.   Difficult to place patient No        Consultants:  Neurology Neurosurgery   Procedures:  ECHO  Antimicrobials:    Subjective: She is alert, couldn't eat breakfast due to right hand weakness.   Objective: Vitals:   01/06/21 1617 01/06/21 2032 01/06/21 2357 01/07/21 0351  BP: (!) 151/74 (!) 158/67 138/63 (!) 130/53  Pulse: 62 62 67 (!) 56  Resp: 19 19 18 19   Temp: 98.5 F (36.9 C) 98.3 F (36.8 C) 98.1 F (36.7 C) 98 F (36.7 C)  TempSrc: Axillary Oral Oral Oral  SpO2: 100% 100% 97% 100%  Weight: 97.2 kg     Height: 5\' 8"  (1.727 m)       Intake/Output Summary (Last 24 hours) at 01/07/2021 0758 Last data filed at 01/06/2021 1535 Gross per 24 hour  Intake --  Output 300 ml  Net -300 ml   Filed Weights   01/05/21 1954 01/06/21 1617  Weight: 94.3 kg 97.2 kg    Examination:  General exam: Appears calm and comfortable  Respiratory system: Clear to auscultation. Respiratory effort normal. Cardiovascular system: S1 & S2 heard, RRR. No JVD, murmurs, rubs, gallops or clicks. No pedal edema. Gastrointestinal  system: Abdomen is nondistended, soft and nontender. No organomegaly or masses felt. Normal bowel sounds heard. Central nervous system: Alert and oriented. Left arm weaker than right Extremities: no edema  Data Reviewed: I have personally reviewed following labs and imaging studies  CBC: Recent Labs  Lab 01/06/21 0037 01/07/21 0417  WBC 6.0 4.6  NEUTROABS 3.3 2.2  HGB 13.4 12.8  HCT 40.8 38.2  MCV 96.2 96.0  PLT 217 867   Basic  Metabolic Panel: Recent Labs  Lab 01/06/21 0037 01/07/21 0417  NA 139 136  K 3.6 3.8  CL 106 106  CO2 25 23  GLUCOSE 123* 105*  BUN 9 6*  CREATININE 0.85 1.00  CALCIUM 9.3 8.6*   GFR: Estimated Creatinine Clearance: 45.6 mL/min (by C-G formula based on SCr of 1 mg/dL). Liver Function Tests: Recent Labs  Lab 01/06/21 0037  AST 11*  ALT 8  ALKPHOS 52  BILITOT 0.9  PROT 6.6  ALBUMIN 3.9   No results for input(s): LIPASE, AMYLASE in the last 168 hours. No results for input(s): AMMONIA in the last 168 hours. Coagulation Profile: Recent Labs  Lab 01/06/21 0037  INR 1.3*   Cardiac Enzymes: No results for input(s): CKTOTAL, CKMB, CKMBINDEX, TROPONINI in the last 168 hours. BNP (last 3 results) No results for input(s): PROBNP in the last 8760 hours. HbA1C: No results for input(s): HGBA1C in the last 72 hours. CBG: No results for input(s): GLUCAP in the last 168 hours. Lipid Profile: Recent Labs    01/07/21 0417  CHOL 131  HDL 68  LDLCALC 53  TRIG 51  CHOLHDL 1.9   Thyroid Function Tests: No results for input(s): TSH, T4TOTAL, FREET4, T3FREE, THYROIDAB in the last 72 hours. Anemia Panel: No results for input(s): VITAMINB12, FOLATE, FERRITIN, TIBC, IRON, RETICCTPCT in the last 72 hours. Sepsis Labs: No results for input(s): PROCALCITON, LATICACIDVEN in the last 168 hours.  Recent Results (from the past 240 hour(s))  Resp Panel by RT-PCR (Flu A&B, Covid) Nasopharyngeal Swab     Status: None   Collection Time: 01/06/21 12:37 AM   Specimen: Nasopharyngeal Swab; Nasopharyngeal(NP) swabs in vial transport medium  Result Value Ref Range Status   SARS Coronavirus 2 by RT PCR NEGATIVE NEGATIVE Final    Comment: (NOTE) SARS-CoV-2 target nucleic acids are NOT DETECTED.  The SARS-CoV-2 RNA is generally detectable in upper respiratory specimens during the acute phase of infection. The lowest concentration of SARS-CoV-2 viral copies this assay can detect is 138  copies/mL. A negative result does not preclude SARS-Cov-2 infection and should not be used as the sole basis for treatment or other patient management decisions. A negative result may occur with  improper specimen collection/handling, submission of specimen other than nasopharyngeal swab, presence of viral mutation(s) within the areas targeted by this assay, and inadequate number of viral copies(<138 copies/mL). A negative result must be combined with clinical observations, patient history, and epidemiological information. The expected result is Negative.  Fact Sheet for Patients:  EntrepreneurPulse.com.au  Fact Sheet for Healthcare Providers:  IncredibleEmployment.be  This test is no t yet approved or cleared by the [place] FDA and  has been authorized for detection and/or diagnosis of SARS-CoV-2 by FDA under an Emergency Use Authorization (EUA). This EUA will remain  in effect (meaning this test can be used) for the duration of the COVID-19 declaration under Section 564(b)(1) of the Act, 21 U.S.C.section 360bbb-3(b)(1), unless the authorization is terminated  or revoked sooner.  Influenza A by PCR NEGATIVE NEGATIVE Final   Influenza B by PCR NEGATIVE NEGATIVE Final    Comment: (NOTE) The Xpert Xpress SARS-CoV-2/FLU/RSV plus assay is intended as an aid in the diagnosis of influenza from Nasopharyngeal swab specimens and should not be used as a sole basis for treatment. Nasal washings and aspirates are unacceptable for Xpert Xpress SARS-CoV-2/FLU/RSV testing.  Fact Sheet for Patients: EntrepreneurPulse.com.au  Fact Sheet for Healthcare Providers: IncredibleEmployment.be  This test is not yet approved or cleared by the [place] FDA and has been authorized for detection and/or diagnosis of SARS-CoV-2 by FDA under an Emergency Use Authorization (EUA). This EUA will remain in effect (meaning  this test can be used) for the duration of the COVID-19 declaration under Section 564(b)(1) of the Act, 21 U.S.C. section 360bbb-3(b)(1), unless the authorization is terminated or revoked.  Performed at [facility], [address] [place], [place] [address]          Radiology Studies: CT Angio Head W or Wo Contrast  Result Date: 01/06/2021 CLINICAL DATA:  Initial evaluation for neuro deficit, stroke suspected. EXAM: CT ANGIOGRAPHY HEAD AND NECK TECHNIQUE: Multidetector CT imaging of the head and neck was performed using the standard protocol during bolus administration of intravenous contrast. Multiplanar CT image reconstructions and MIPs were obtained to evaluate the vascular anatomy. Carotid stenosis measurements (when applicable) are obtained utilizing NASCET criteria, using the distal internal carotid diameter as the denominator. CONTRAST:  69mL OMNIPAQUE IOHEXOL 350 MG/ML SOLN COMPARISON:  Head CT from 01/05/2021. FINDINGS: CTA NECK FINDINGS Aortic arch: Visualized aortic arch normal in caliber with normal branch pattern. Mild for age atheromatous change about the aortic arch. No stenosis about the origin of the great vessels. Right carotid system: Right CCA tortuous proximally but is widely patent to the bifurcation. No significant atheromatous narrowing about the right carotid bulb. Right ICA widely patent without stenosis dissection or occlusion. Left carotid system: Left CCA tortuous proximally but is widely patent to the bifurcation. No significant atheromatous narrowing about the left carotid bulb. Left ICA widely patent distally without stenosis, dissection or occlusion. Vertebral arteries: Both vertebral arteries arise from the subclavian arteries. Left vertebral artery slightly dominant. No proximal subclavian artery stenosis. Vertebral arteries patent without stenosis dissection or occlusion. Skeleton: No visible acute osseous finding. No discrete or worrisome  osseous lesions. Other neck: No other acute soft tissue abnormality within the neck. Few hypodense left thyroid nodules noted, largest of which measures 2.1 cm. In the setting of significant comorbidities or limited life expectancy, no follow-up recommended (ref: J Am Coll Radiol. 2015 Feb;12(2): 143-50).No other mass or adenopathy. Chronic paranasal sinus disease noted. Upper chest: Visualized upper chest demonstrates no acute finding. Review of the MIP images confirms the above findings CTA HEAD FINDINGS Anterior circulation: Petrous segments patent bilaterally. Mild for age atheromatous change within the carotid siphons without stenosis. A1 segments widely patent. Normal anterior communicating artery complex. Anterior cerebral arteries patent to their distal aspects without stenosis. Left M1 segment widely patent. Normal left MCA bifurcation. Distal left MCA branches widely patent and well perfused. Right M1 segment patent proximally. There is a severe high-grade stenosis at the distal right M1 segment/right MCA bifurcation (series 9, image 20). This is just beyond the takeoff of a small anterior temporal branch. Right MCA branches patent distally. Posterior circulation: Both V4 segments patent to the vertebrobasilar junction without stenosis. Left PICA origin patent. Right PICA not seen. Basilar patent to its distal aspect without stenosis. Superior  cerebral arteries patent bilaterally. Left PCA supplied via the basilar. Right PCA supplied via the basilar as well as a robust right posterior communicating artery. Both PCAs patent to their distal aspects without stenosis. Venous sinuses: Grossly patent allowing for timing the contrast bolus. Anatomic variants: None significant.  No aneurysm. Review of the MIP images confirms the above findings IMPRESSION: 1. Negative CTA for large vessel occlusion. 2. Severe high-grade stenosis at the distal right M1 segment/right MCA bifurcation. 3. Relatively minor atheromatous  disease for patient age elsewhere about the major arterial vasculature of the head and neck. No other hemodynamically significant or correctable stenosis. 4. Chronic paranasal sinusitis. Electronically Signed   By: Jeannine Boga M.D.   On: 01/06/2021 04:24   CT HEAD WO CONTRAST (5MM)  Result Date: 01/05/2021 CLINICAL DATA:  Recent falls today with headaches and neck pain, initial encounter EXAM: CT HEAD WITHOUT CONTRAST CT CERVICAL SPINE WITHOUT CONTRAST TECHNIQUE: Multidetector CT imaging of the head and cervical spine was performed following the standard protocol without intravenous contrast. Multiplanar CT image reconstructions of the cervical spine were also generated. COMPARISON:  09/09/2018 FINDINGS: CT HEAD FINDINGS Brain: No evidence of acute infarction, hemorrhage, hydrocephalus, extra-axial collection or mass lesion/mass effect. Chronic atrophic and ischemic changes are noted Vascular: No hyperdense vessel or unexpected calcification. Skull: Normal. Negative for fracture or focal lesion. Sinuses/Orbits: No acute finding. Other: None. CT CERVICAL SPINE FINDINGS Alignment: Stable straightening of the normal cervical lordosis. Skull base and vertebrae: 7 cervical segments are well visualized. Vertebral body height is well maintained. Osteophytic changes are noted predominately from C5-C7. No acute fracture or acute facet abnormality is noted. Multilevel facet hypertrophic changes are seen. Soft tissues and spinal canal: Surrounding soft tissue structures are well visualized. No acute abnormality is noted. There are 2 adjacent hypodense lesions in the left lobe of the thyroid. The largest of these measures 2.0 cm. Upper chest: Visualized lung apices are within normal limits. Other: None IMPRESSION: CT of the head: Chronic atrophic and ischemic changes. No acute abnormality noted. CT of the cervical spine: Multilevel degenerative change. Hypodense thyroid nodules in the left lobe of the liver as  described. No follow-up recommended unless clinically warranted (ref: J Am Coll Radiol. 2015 Feb;12(2): 143-50). Electronically Signed   By: Inez Catalina M.D.   On: 01/05/2021 22:28   CT Angio Neck W and/or Wo Contrast  Result Date: 01/06/2021 CLINICAL DATA:  Initial evaluation for neuro deficit, stroke suspected. EXAM: CT ANGIOGRAPHY HEAD AND NECK TECHNIQUE: Multidetector CT imaging of the head and neck was performed using the standard protocol during bolus administration of intravenous contrast. Multiplanar CT image reconstructions and MIPs were obtained to evaluate the vascular anatomy. Carotid stenosis measurements (when applicable) are obtained utilizing NASCET criteria, using the distal internal carotid diameter as the denominator. CONTRAST:  30mL OMNIPAQUE IOHEXOL 350 MG/ML SOLN COMPARISON:  Head CT from 01/05/2021. FINDINGS: CTA NECK FINDINGS Aortic arch: Visualized aortic arch normal in caliber with normal branch pattern. Mild for age atheromatous change about the aortic arch. No stenosis about the origin of the great vessels. Right carotid system: Right CCA tortuous proximally but is widely patent to the bifurcation. No significant atheromatous narrowing about the right carotid bulb. Right ICA widely patent without stenosis dissection or occlusion. Left carotid system: Left CCA tortuous proximally but is widely patent to the bifurcation. No significant atheromatous narrowing about the left carotid bulb. Left ICA widely patent distally without stenosis, dissection or occlusion. Vertebral arteries: Both vertebral arteries arise  from the subclavian arteries. Left vertebral artery slightly dominant. No proximal subclavian artery stenosis. Vertebral arteries patent without stenosis dissection or occlusion. Skeleton: No visible acute osseous finding. No discrete or worrisome osseous lesions. Other neck: No other acute soft tissue abnormality within the neck. Few hypodense left thyroid nodules noted, largest  of which measures 2.1 cm. In the setting of significant comorbidities or limited life expectancy, no follow-up recommended (ref: J Am Coll Radiol. 2015 Feb;12(2): 143-50).No other mass or adenopathy. Chronic paranasal sinus disease noted. Upper chest: Visualized upper chest demonstrates no acute finding. Review of the MIP images confirms the above findings CTA HEAD FINDINGS Anterior circulation: Petrous segments patent bilaterally. Mild for age atheromatous change within the carotid siphons without stenosis. A1 segments widely patent. Normal anterior communicating artery complex. Anterior cerebral arteries patent to their distal aspects without stenosis. Left M1 segment widely patent. Normal left MCA bifurcation. Distal left MCA branches widely patent and well perfused. Right M1 segment patent proximally. There is a severe high-grade stenosis at the distal right M1 segment/right MCA bifurcation (series 9, image 20). This is just beyond the takeoff of a small anterior temporal branch. Right MCA branches patent distally. Posterior circulation: Both V4 segments patent to the vertebrobasilar junction without stenosis. Left PICA origin patent. Right PICA not seen. Basilar patent to its distal aspect without stenosis. Superior cerebral arteries patent bilaterally. Left PCA supplied via the basilar. Right PCA supplied via the basilar as well as a robust right posterior communicating artery. Both PCAs patent to their distal aspects without stenosis. Venous sinuses: Grossly patent allowing for timing the contrast bolus. Anatomic variants: None significant.  No aneurysm. Review of the MIP images confirms the above findings IMPRESSION: 1. Negative CTA for large vessel occlusion. 2. Severe high-grade stenosis at the distal right M1 segment/right MCA bifurcation. 3. Relatively minor atheromatous disease for patient age elsewhere about the major arterial vasculature of the head and neck. No other hemodynamically significant or  correctable stenosis. 4. Chronic paranasal sinusitis. Electronically Signed   By: Jeannine Boga M.D.   On: 01/06/2021 04:24   CT Cervical Spine Wo Contrast  Result Date: 01/05/2021 CLINICAL DATA:  Recent falls today with headaches and neck pain, initial encounter EXAM: CT HEAD WITHOUT CONTRAST CT CERVICAL SPINE WITHOUT CONTRAST TECHNIQUE: Multidetector CT imaging of the head and cervical spine was performed following the standard protocol without intravenous contrast. Multiplanar CT image reconstructions of the cervical spine were also generated. COMPARISON:  09/09/2018 FINDINGS: CT HEAD FINDINGS Brain: No evidence of acute infarction, hemorrhage, hydrocephalus, extra-axial collection or mass lesion/mass effect. Chronic atrophic and ischemic changes are noted Vascular: No hyperdense vessel or unexpected calcification. Skull: Normal. Negative for fracture or focal lesion. Sinuses/Orbits: No acute finding. Other: None. CT CERVICAL SPINE FINDINGS Alignment: Stable straightening of the normal cervical lordosis. Skull base and vertebrae: 7 cervical segments are well visualized. Vertebral body height is well maintained. Osteophytic changes are noted predominately from C5-C7. No acute fracture or acute facet abnormality is noted. Multilevel facet hypertrophic changes are seen. Soft tissues and spinal canal: Surrounding soft tissue structures are well visualized. No acute abnormality is noted. There are 2 adjacent hypodense lesions in the left lobe of the thyroid. The largest of these measures 2.0 cm. Upper chest: Visualized lung apices are within normal limits. Other: None IMPRESSION: CT of the head: Chronic atrophic and ischemic changes. No acute abnormality noted. CT of the cervical spine: Multilevel degenerative change. Hypodense thyroid nodules in the left lobe of the liver  as described. No follow-up recommended unless clinically warranted (ref: J Am Coll Radiol. 2015 Feb;12(2): 143-50). Electronically  Signed   By: Inez Catalina M.D.   On: 01/05/2021 22:28   MR BRAIN WO CONTRAST  Result Date: 01/06/2021 CLINICAL DATA:  Neuro deficit, acute, stroke suspected. EXAM: MRI HEAD WITHOUT CONTRAST TECHNIQUE: Multiplanar, multiecho pulse sequences of the brain and surrounding structures were obtained without intravenous contrast. COMPARISON:  CT studies same day. FINDINGS: Brain: Diffusion imaging shows extensive patchy infarction in the right parietal lobe and to a lesser extent the posterior frontal lobe, consistent with embolic infarctions in the right MCA territory. No mass effect or hemorrhage. Elsewhere, chronic small-vessel ischemic changes affect pons. No focal abnormality affects the cerebellum. Cerebral hemispheres show extensive chronic small-vessel ischemic changes throughout the white matter. No hydrocephalus or extra-axial collection. Vascular: Major vessels at the base of the brain show flow. Skull and upper cervical spine: Negative Sinuses/Orbits: Mucosal inflammatory changes throughout the paranasal sinuses. Hypodense scratch that hypointense material within the sphenoid sinus that could indicate fungal sinusitis. Other: None IMPRESSION: Extensive patchy infarction in the right parietal lobe and to a lesser extent the posterior frontal lobe consistent with embolic infarction in the right MCA territory. No mass effect or hemorrhage. Extensive chronic small-vessel ischemic changes elsewhere, especially the cerebral hemispheric white matter. Sinus inflammatory disease with widespread mucosal thickening. Hypointense material filling the sphenoid sinus suggests fungal sinusitis. Electronically Signed   By: Nelson Chimes M.D.   On: 01/06/2021 19:26   MR Cervical Spine Wo Contrast  Result Date: 01/06/2021 CLINICAL DATA:  Spinal stenosis. EXAM: MRI CERVICAL SPINE WITHOUT CONTRAST TECHNIQUE: Multiplanar, multisequence MR imaging of the cervical spine was performed. No intravenous contrast was administered.  COMPARISON:  CT same day. FINDINGS: The study suffers from considerable motion degradation. Alignment: Straightening of the normal cervical lordosis. Vertebrae: No fracture or focal bone lesion. Cord: See below regarding stenosis. Posterior Fossa, vertebral arteries, paraspinal tissues: See results of brain MRI. Soft tissues of the neck appear unremarkable. Disc levels: Foramen magnum is widely patent.  C1-2 and C2-3 are unremarkable. C3-4: Central disc herniation effaces the ventral subarachnoid space and indents the cord. AP diameter of the canal in the midline 6.2 mm. Early abnormal T2 signal within the cord. No foraminal encroachment. C4-5: Small central disc herniation effaces the ventral subarachnoid space but does not deform the cord. AP diameter of the canal 8.2 mm. No foraminal stenosis. C5-6: Shallow disc protrusion indents the ventral subarachnoid space but does not affect cord. AP diameter of the canal 11 mm. No foraminal stenosis. C6-7: Mild noncompressive disc bulge. C7-T1: Normal interspace. 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 AP diameter of the canal 6.2 mm. Early abnormal T2 signal within the cord. Small central disc herniation at C4-5 indents the subarachnoid space but does not deform the cord or cause compressive stenosis. Non-compressive disc bulges at C5-6 and C6-7. Electronically Signed   By: Nelson Chimes M.D.   On: 01/06/2021 19:39        Scheduled Meds:  aspirin EC  81 mg Oral Daily   clopidogrel  75 mg Oral Daily   enoxaparin (LOVENOX) injection  40 mg Subcutaneous Q24H   lisinopril  20 mg Oral BID   And   hydrochlorothiazide  12.5 mg Oral BID   Continuous Infusions:   LOS: 0 days    Time spent: 35 minutes.     [place],  MD [facility]   If 7PM-7AM, please contact night-coverage [email or web address]  01/07/2021, 7:58 AM

## 2021-01-07 NOTE — TOC Initial Note (Signed)
Transition of Care [facility]) - Initial/Assessment Note    Patient Details  Name: [name] MRN: [id number] Date of Birth: 1928-10-11  Transition of Care [facility]) CM/SW Contact:    Pollie Friar, RN Phone Number: 01/07/2021, 2:35 PM  Clinical Narrative:                 CM met patient and then spoke with daughter in waiting room. Pt lives with the daughter but she works evenings and nights. She is going to arrange for family and friends to stay with her as needed.  Daughter is interested in Outpatient therapy. She would like [employer]. Orders in Epic and information on the AVS. Recommendations for tub bench and 3 in 1. Daughter also requesting a wheelchair for home. [employer] updated and will deliver DME to her room. Daughter will provide transport home when medically ready.   Expected Discharge Plan: OP Rehab Barriers to Discharge: Continued Medical Work up   Patient Goals and CMS Choice   CMS Medicare.gov Compare Post Acute Care list provided to:: Patient Represenative (must comment) Choice offered to / list presented to : Adult Children (daughter)  Expected Discharge Plan and Services Expected Discharge Plan: OP Rehab   Discharge Planning Services: CM Consult Post Acute Care Choice: Durable Medical Equipment Living arrangements for the past 2 months: Single Family Home                 DME Arranged: 3-N-1, Tub bench, Lightweight manual wheelchair with seat cushion DME Agency: [employer] Date DME Agency Contacted: 01/07/21   Representative spoke with at DME Agency: Freda Munro            Prior Living Arrangements/Services Living arrangements for the past 2 months: Single Family Home Lives with:: Adult Children Patient language and need for interpreter reviewed:: Yes Do you feel safe going back to the place where you live?: Yes      Need for Family Participation in Patient Care: Yes (Comment) Care giver support system in place?: Yes (comment) Current home  services: DME (canes) Criminal Activity/Legal Involvement Pertinent to Current Situation/Hospitalization: No - Comment as needed  Activities of Daily Living Home Assistive Devices/Equipment: Cane (specify quad or straight) ADL Screening (condition at time of admission) Patient's cognitive ability adequate to safely complete daily activities?: Yes Is the patient deaf or have difficulty hearing?: Yes Does the patient have difficulty seeing, even when wearing glasses/contacts?: No Does the patient have difficulty concentrating, remembering, or making decisions?: No Patient able to express need for assistance with ADLs?: Yes Does the patient have difficulty dressing or bathing?: No Independently performs ADLs?: Yes (appropriate for developmental age) Does the patient have difficulty walking or climbing stairs?: No Weakness of Legs: Both Weakness of Arms/Hands: Both  Permission Sought/Granted                  Emotional Assessment Appearance:: Appears stated age Attitude/Demeanor/Rapport: Engaged Affect (typically observed): Accepting Orientation: : Oriented to Self, Oriented to Place, Oriented to  Time, Oriented to Situation   Psych Involvement: No (comment)  Admission diagnosis:  CVA (cerebral vascular accident) [facility]) [I63.9] Acute ischemic right MCA stroke [facility]) [I63.511] Patient Active Problem List   Diagnosis Date Noted   CVA (cerebral vascular accident) ([place]) 01/06/2021   Essential hypertension 01/06/2021   Arthritis 01/06/2021   Pain due to onychomycosis of toenails of both feet 12/14/2018   Presbycusis of both ears 09/22/2015   Syncope 05/24/2013   PCP:  Nolene Ebbs, MD Pharmacy:  [id number]  - [address] [place]Phone: [phone]: [phone] [id number] [address]: [phone]: [phone]   Social Determinants of Health (SDOH) Interventions    Readmission Risk Interventions No flowsheet data found.

## 2021-01-08 MED ORDER — ASPIRIN 325 MG PO TBEC: 325.0000 mg | DELAYED_RELEASE_TABLET | Freq: Every day | ORAL | 3 refills | Status: DC

## 2021-01-08 MED ORDER — CLOPIDOGREL BISULFATE 75 MG PO TABS: 75.0000 mg | ORAL_TABLET | Freq: Every day | ORAL | 3 refills | Status: DC

## 2021-01-08 MED ORDER — TRAMADOL HCL 50 MG PO TABS: 50.0000 mg | ORAL_TABLET | Freq: Three times a day (TID) | ORAL | Status: DC | PRN

## 2021-01-08 MED ORDER — TRAMADOL HCL 50 MG PO TABS: 50.0000 mg | ORAL_TABLET | Freq: Three times a day (TID) | ORAL | 0 refills | Status: AC | PRN

## 2021-01-08 NOTE — Discharge Summary (Addendum)
Physician Discharge Summary  [name] IEP:[id number] DOB: 09-Oct-1928 DOA: 01/05/2021  PCP: Nolene Ebbs, MD  Admit date: 01/05/2021 Discharge date: 01/08/2021  Admitted From: Home  Disposition:  Home   Recommendations for Outpatient Follow-up:  Follow up with PCP in 1-2 weeks Please obtain BMP/CBC in one week Needs repeat A1c in 3 months. Consider adding Metformin out patient.  Needs aspirin and plavix for 3 Months-then aspirin alone.   Home Health: Outpatient PT Equipment/Devices: Wheel chair, bench shower.   Discharge Condition:Stable.  CODE STATUS:Full code Diet recommendation:  Carb Modified   Brief/Interim Summary: 87 year old right-handed female with medical history significant for hypertension, DVT of bilateral leg not on anticoagulation who presented after falling twice at home. On admission she was found to have acute stroke.  MRI of the cervical spine showed large central disc herniation at C3-C4) effaces the ventral subarachnoid space and indents the ventral cord slightly.  Small central disc herniation at C4-C5, does not deform the cord COMPRESSIVE for stenosis.   Patient admitted for further evaluation of stroke.   1-Acute stroke: Right parietal lobe and posterior frontal lobe stroke MRI: Extensive patchy infarction in the right parietal lobe and to lesser extent to the posterior frontal lobe consistent with embolic infarct in the right MCA. CTA: Negative for large vessel occlusion.  Severe high-grade stenosis of the distal right M1 segment right MCA bifurcation. Echo: Normal EF LDL: 53.  A1c 5.7, pre Diabetes, she will follow carb Modified diet.  Continue with aspirin and Plavix. Needs aspirin plavix for 3 Months, then aspirin alone.     C3-C4 large central disc herniation, early abnormal T2 signal within the cord: Patient was evaluated by neurosurgery, patient is not a surgical candidate. No need for cervical collar.   Sinus inflammatory disease,  hypointense material filling the sphenoid sinus suggest fungal sinusitis on MRI: Patient with mild symptoms.  Patient is not febrile, not leukopenic.  Discussed with ID no need for antifungal treatment   Hypertension: Permissive hypertension. Resume lisinopril and hydrochlorothiazide at discharge   Estimated body mass index is 32.58 kg/m as calculated from the following:   Height as of this encounter: 5\' 8"  (1.727 m).   Weight as of this encounter: 97.2 kg.           Discharge Diagnoses:  Principal Problem:   CVA (cerebral vascular accident) [facility]) Active Problems:   Essential hypertension   Arthritis    Discharge Instructions  Discharge Instructions     Ambulatory referral to Neurology   Complete by: As directed    Follow up with stroke clinic NP (Jessica Vanschaick or Cecille Rubin, if both not available, consider Zachery Dauer, or Ahern) at [facility] in about 4 weeks. Thanks.   Ambulatory referral to Occupational Therapy   Complete by: As directed    Ambulatory referral to Physical Therapy   Complete by: As directed    Diet - low sodium heart healthy   Complete by: As directed    Increase activity slowly   Complete by: As directed       Allergies as of 01/08/2021       Reactions   Codeine Other (See Comments)   Passed out Pass out Passed out   Penicillins Other (See Comments)   Passed out Has patient had a PCN reaction causing immediate rash, facial/tongue/throat swelling, SOB or lightheadedness with hypotension: Yes Has patient had a PCN reaction causing severe rash involving mucus membranes or skin necrosis: Unk Has patient had a PCN reaction  that required hospitalization: No Has patient had a PCN reaction occurring within the last 10 years: No If all of the above answers are "NO", then may proceed with Cephalosporin use. Pass out Passed out Has patient had a PCN reaction causing immediate rash, facial/tongue/throat swelling, SOB or lightheadedness with  hypotension: Yes Has patient had a PCN reaction causing severe rash involving mucus membranes or skin necrosis: Unk Has patient had a PCN reaction that required hospitalization: No Has patient had a PCN reaction occurring within the last 10 years: No If all of the above answers are "NO", then may proceed with Cephalosporin use.        Medication List     TAKE these medications    acetaminophen 325 MG tablet Commonly known as: TYLENOL Take 2 tablets (650 mg total) by mouth every 4 (four) hours as needed for mild pain (or temp > 37.5 C (99.5 F)).   albuterol 108 (90 Base) MCG/ACT inhaler Commonly known as: VENTOLIN HFA Inhale 1-2 puffs into the lungs every 6 (six) hours as needed for wheezing or shortness of breath.   aspirin 325 MG EC tablet Take 1 tablet (325 mg total) by mouth daily.   atorvastatin 20 MG tablet Commonly known as: LIPITOR Take 1 tablet (20 mg total) by mouth daily.   clopidogrel 75 MG tablet Commonly known as: PLAVIX Take 1 tablet (75 mg total) by mouth daily.   diclofenac sodium 1 % Gel Commonly known as: VOLTAREN Apply 2-4 g topically 4 (four) times daily as needed (for knee pain).   lisinopril-hydrochlorothiazide 20-12.5 MG tablet Commonly known as: ZESTORETIC Take 1 tablet by mouth 2 (two) times daily.   traMADol 50 MG tablet Commonly known as: ULTRAM Take 1 tablet (50 mg total) by mouth every 8 (eight) hours as needed for up to 3 days for severe pain.               Durable Medical Equipment  (From admission, onward)           Start     Ordered   01/07/21 1422  For home use only DME 3 n 1  Once        01/07/21 1421   01/07/21 1422  For home use only DME lightweight manual wheelchair with seat cushion  Once       Comments: Patient suffers from stroke which impairs their ability to perform daily activities like bathing and grooming in the home.  A walker will not resolve  issue with performing activities of daily living. A wheelchair  will allow patient to safely perform daily activities. Patient is not able to propel themselves in the home using a standard weight wheelchair due to general weakness. Patient can self propel in the lightweight wheelchair. Length of need Lifetime. Accessories: elevating leg rests (ELRs), wheel locks, extensions and anti-tippers.   01/07/21 1422   01/07/21 1308  For home use only DME Tub bench  Once        01/07/21 1307            Follow-up Information     [facility] Follow up in 1 month(s).   Specialty: Neurology Contact information: [address] [place]. Schedule an appointment as soon as possible for a visit.   Specialty: Rehabilitation Why: Please call the outpatient rehab within 24 hours of discharge to schedule an appointment. (Monday-Friday) Contact information: [address] [address] [phone]               Allergies  Allergen Reactions   Codeine Other (See Comments)    Passed out Pass out Passed out   Penicillins Other (See Comments)    Passed out Has patient had a PCN reaction causing immediate rash, facial/tongue/throat swelling, SOB or lightheadedness with hypotension: Yes Has patient had a PCN reaction causing severe rash involving mucus membranes or skin necrosis: Unk Has patient had a PCN reaction that required hospitalization: No Has patient had a PCN reaction occurring within the last 10 years: No If all of the above answers are "NO", then may proceed with Cephalosporin use.  Pass out Passed out Has patient had a PCN reaction causing immediate rash, facial/tongue/throat swelling, SOB or lightheadedness with hypotension: Yes Has patient had a PCN reaction causing severe rash involving mucus membranes or skin necrosis: Unk Has patient had a PCN reaction that  required hospitalization: No Has patient had a PCN reaction occurring within the last 10 years: No If all of the above answers are "NO", then may proceed with Cephalosporin use.    Consultations: Neurology Neurosurgery    Procedures/Studies: CT Angio Head W or Wo Contrast  Result Date: 01/06/2021 CLINICAL DATA:  Initial evaluation for neuro deficit, stroke suspected. EXAM: CT ANGIOGRAPHY HEAD AND NECK TECHNIQUE: Multidetector CT imaging of the head and neck was performed using the standard protocol during bolus administration of intravenous contrast. Multiplanar CT image reconstructions and MIPs were obtained to evaluate the vascular anatomy. Carotid stenosis measurements (when applicable) are obtained utilizing NASCET criteria, using the distal internal carotid diameter as the denominator. CONTRAST:  47mL OMNIPAQUE IOHEXOL 350 MG/ML SOLN COMPARISON:  Head CT from 01/05/2021. FINDINGS: CTA NECK FINDINGS Aortic arch: Visualized aortic arch normal in caliber with normal branch pattern. Mild for age atheromatous change about the aortic arch. No stenosis about the origin of the great vessels. Right carotid system: Right CCA tortuous proximally but is widely patent to the bifurcation. No significant atheromatous narrowing about the right carotid bulb. Right ICA widely patent without stenosis dissection or occlusion. Left carotid system: Left CCA tortuous proximally but is widely patent to the bifurcation. No significant atheromatous narrowing about the left carotid bulb. Left ICA widely patent distally without stenosis, dissection or occlusion. Vertebral arteries: Both vertebral arteries arise from the subclavian arteries. Left vertebral artery slightly dominant. No proximal subclavian artery stenosis. Vertebral arteries patent without stenosis dissection or occlusion. Skeleton: No visible acute osseous finding. No discrete or worrisome osseous lesions. Other neck: No other acute soft tissue abnormality  within the neck. Few hypodense left thyroid nodules noted, largest of which measures 2.1 cm. In the setting of significant comorbidities or limited life expectancy, no follow-up recommended (ref: J Am Coll Radiol. 2015 Feb;12(2): 143-50).No other mass or adenopathy. Chronic paranasal sinus disease noted. Upper chest: Visualized upper chest demonstrates no acute finding. Review of the MIP images confirms the above findings CTA HEAD FINDINGS Anterior circulation: Petrous segments patent bilaterally. Mild for age atheromatous change within the carotid siphons without stenosis. A1 segments widely patent. Normal anterior communicating artery complex. Anterior cerebral arteries patent to their distal aspects without stenosis. Left M1 segment widely patent. Normal left MCA bifurcation. Distal left MCA branches widely patent and well perfused. Right M1 segment patent proximally. There is a severe high-grade stenosis at the distal right M1 segment/right MCA bifurcation (series 9, image 20). This is just beyond the  takeoff of a small anterior temporal branch. Right MCA branches patent distally. Posterior circulation: Both V4 segments patent to the vertebrobasilar junction without stenosis. Left PICA origin patent. Right PICA not seen. Basilar patent to its distal aspect without stenosis. Superior cerebral arteries patent bilaterally. Left PCA supplied via the basilar. Right PCA supplied via the basilar as well as a robust right posterior communicating artery. Both PCAs patent to their distal aspects without stenosis. Venous sinuses: Grossly patent allowing for timing the contrast bolus. Anatomic variants: None significant.  No aneurysm. Review of the MIP images confirms the above findings IMPRESSION: 1. Negative CTA for large vessel occlusion. 2. Severe high-grade stenosis at the distal right M1 segment/right MCA bifurcation. 3. Relatively minor atheromatous disease for patient age elsewhere about the major arterial  vasculature of the head and neck. No other hemodynamically significant or correctable stenosis. 4. Chronic paranasal sinusitis. Electronically Signed   By: Jeannine Boga M.D.   On: 01/06/2021 04:24   CT HEAD WO CONTRAST (5MM)  Result Date: 01/05/2021 CLINICAL DATA:  Recent falls today with headaches and neck pain, initial encounter EXAM: CT HEAD WITHOUT CONTRAST CT CERVICAL SPINE WITHOUT CONTRAST TECHNIQUE: Multidetector CT imaging of the head and cervical spine was performed following the standard protocol without intravenous contrast. Multiplanar CT image reconstructions of the cervical spine were also generated. COMPARISON:  09/09/2018 FINDINGS: CT HEAD FINDINGS Brain: No evidence of acute infarction, hemorrhage, hydrocephalus, extra-axial collection or mass lesion/mass effect. Chronic atrophic and ischemic changes are noted Vascular: No hyperdense vessel or unexpected calcification. Skull: Normal. Negative for fracture or focal lesion. Sinuses/Orbits: No acute finding. Other: None. CT CERVICAL SPINE FINDINGS Alignment: Stable straightening of the normal cervical lordosis. Skull base and vertebrae: 7 cervical segments are well visualized. Vertebral body height is well maintained. Osteophytic changes are noted predominately from C5-C7. No acute fracture or acute facet abnormality is noted. Multilevel facet hypertrophic changes are seen. Soft tissues and spinal canal: Surrounding soft tissue structures are well visualized. No acute abnormality is noted. There are 2 adjacent hypodense lesions in the left lobe of the thyroid. The largest of these measures 2.0 cm. Upper chest: Visualized lung apices are within normal limits. Other: None IMPRESSION: CT of the head: Chronic atrophic and ischemic changes. No acute abnormality noted. CT of the cervical spine: Multilevel degenerative change. Hypodense thyroid nodules in the left lobe of the liver as described. No follow-up recommended unless clinically warranted  (ref: J Am Coll Radiol. 2015 Feb;12(2): 143-50). Electronically Signed   By: Inez Catalina M.D.   On: 01/05/2021 22:28   CT Angio Neck W and/or Wo Contrast  Result Date: 01/06/2021 CLINICAL DATA:  Initial evaluation for neuro deficit, stroke suspected. EXAM: CT ANGIOGRAPHY HEAD AND NECK TECHNIQUE: Multidetector CT imaging of the head and neck was performed using the standard protocol during bolus administration of intravenous contrast. Multiplanar CT image reconstructions and MIPs were obtained to evaluate the vascular anatomy. Carotid stenosis measurements (when applicable) are obtained utilizing NASCET criteria, using the distal internal carotid diameter as the denominator. CONTRAST:  32mL OMNIPAQUE IOHEXOL 350 MG/ML SOLN COMPARISON:  Head CT from 01/05/2021. FINDINGS: CTA NECK FINDINGS Aortic arch: Visualized aortic arch normal in caliber with normal branch pattern. Mild for age atheromatous change about the aortic arch. No stenosis about the origin of the great vessels. Right carotid system: Right CCA tortuous proximally but is widely patent to the bifurcation. No significant atheromatous narrowing about the right carotid bulb. Right ICA widely patent without stenosis dissection  or occlusion. Left carotid system: Left CCA tortuous proximally but is widely patent to the bifurcation. No significant atheromatous narrowing about the left carotid bulb. Left ICA widely patent distally without stenosis, dissection or occlusion. Vertebral arteries: Both vertebral arteries arise from the subclavian arteries. Left vertebral artery slightly dominant. No proximal subclavian artery stenosis. Vertebral arteries patent without stenosis dissection or occlusion. Skeleton: No visible acute osseous finding. No discrete or worrisome osseous lesions. Other neck: No other acute soft tissue abnormality within the neck. Few hypodense left thyroid nodules noted, largest of which measures 2.1 cm. In the setting of significant  comorbidities or limited life expectancy, no follow-up recommended (ref: J Am Coll Radiol. 2015 Feb;12(2): 143-50).No other mass or adenopathy. Chronic paranasal sinus disease noted. Upper chest: Visualized upper chest demonstrates no acute finding. Review of the MIP images confirms the above findings CTA HEAD FINDINGS Anterior circulation: Petrous segments patent bilaterally. Mild for age atheromatous change within the carotid siphons without stenosis. A1 segments widely patent. Normal anterior communicating artery complex. Anterior cerebral arteries patent to their distal aspects without stenosis. Left M1 segment widely patent. Normal left MCA bifurcation. Distal left MCA branches widely patent and well perfused. Right M1 segment patent proximally. There is a severe high-grade stenosis at the distal right M1 segment/right MCA bifurcation (series 9, image 20). This is just beyond the takeoff of a small anterior temporal branch. Right MCA branches patent distally. Posterior circulation: Both V4 segments patent to the vertebrobasilar junction without stenosis. Left PICA origin patent. Right PICA not seen. Basilar patent to its distal aspect without stenosis. Superior cerebral arteries patent bilaterally. Left PCA supplied via the basilar. Right PCA supplied via the basilar as well as a robust right posterior communicating artery. Both PCAs patent to their distal aspects without stenosis. Venous sinuses: Grossly patent allowing for timing the contrast bolus. Anatomic variants: None significant.  No aneurysm. Review of the MIP images confirms the above findings IMPRESSION: 1. Negative CTA for large vessel occlusion. 2. Severe high-grade stenosis at the distal right M1 segment/right MCA bifurcation. 3. Relatively minor atheromatous disease for patient age elsewhere about the major arterial vasculature of the head and neck. No other hemodynamically significant or correctable stenosis. 4. Chronic paranasal sinusitis.  Electronically Signed   By: Jeannine Boga M.D.   On: 01/06/2021 04:24   CT Cervical Spine Wo Contrast  Result Date: 01/05/2021 CLINICAL DATA:  Recent falls today with headaches and neck pain, initial encounter EXAM: CT HEAD WITHOUT CONTRAST CT CERVICAL SPINE WITHOUT CONTRAST TECHNIQUE: Multidetector CT imaging of the head and cervical spine was performed following the standard protocol without intravenous contrast. Multiplanar CT image reconstructions of the cervical spine were also generated. COMPARISON:  09/09/2018 FINDINGS: CT HEAD FINDINGS Brain: No evidence of acute infarction, hemorrhage, hydrocephalus, extra-axial collection or mass lesion/mass effect. Chronic atrophic and ischemic changes are noted Vascular: No hyperdense vessel or unexpected calcification. Skull: Normal. Negative for fracture or focal lesion. Sinuses/Orbits: No acute finding. Other: None. CT CERVICAL SPINE FINDINGS Alignment: Stable straightening of the normal cervical lordosis. Skull base and vertebrae: 7 cervical segments are well visualized. Vertebral body height is well maintained. Osteophytic changes are noted predominately from C5-C7. No acute fracture or acute facet abnormality is noted. Multilevel facet hypertrophic changes are seen. Soft tissues and spinal canal: Surrounding soft tissue structures are well visualized. No acute abnormality is noted. There are 2 adjacent hypodense lesions in the left lobe of the thyroid. The largest of these measures 2.0 cm. Upper chest:  Visualized lung apices are within normal limits. Other: None IMPRESSION: CT of the head: Chronic atrophic and ischemic changes. No acute abnormality noted. CT of the cervical spine: Multilevel degenerative change. Hypodense thyroid nodules in the left lobe of the liver as described. No follow-up recommended unless clinically warranted (ref: J Am Coll Radiol. 2015 Feb;12(2): 143-50). Electronically Signed   By: Inez Catalina M.D.   On: 01/05/2021 22:28    MR BRAIN WO CONTRAST  Result Date: 01/06/2021 CLINICAL DATA:  Neuro deficit, acute, stroke suspected. EXAM: MRI HEAD WITHOUT CONTRAST TECHNIQUE: Multiplanar, multiecho pulse sequences of the brain and surrounding structures were obtained without intravenous contrast. COMPARISON:  CT studies same day. FINDINGS: Brain: Diffusion imaging shows extensive patchy infarction in the right parietal lobe and to a lesser extent the posterior frontal lobe, consistent with embolic infarctions in the right MCA territory. No mass effect or hemorrhage. Elsewhere, chronic small-vessel ischemic changes affect pons. No focal abnormality affects the cerebellum. Cerebral hemispheres show extensive chronic small-vessel ischemic changes throughout the white matter. No hydrocephalus or extra-axial collection. Vascular: Major vessels at the base of the brain show flow. Skull and upper cervical spine: Negative Sinuses/Orbits: Mucosal inflammatory changes throughout the paranasal sinuses. Hypodense scratch that hypointense material within the sphenoid sinus that could indicate fungal sinusitis. Other: None IMPRESSION: Extensive patchy infarction in the right parietal lobe and to a lesser extent the posterior frontal lobe consistent with embolic infarction in the right MCA territory. No mass effect or hemorrhage. Extensive chronic small-vessel ischemic changes elsewhere, especially the cerebral hemispheric white matter. Sinus inflammatory disease with widespread mucosal thickening. Hypointense material filling the sphenoid sinus suggests fungal sinusitis. Electronically Signed   By: Nelson Chimes M.D.   On: 01/06/2021 19:26   MR Cervical Spine Wo Contrast  Result Date: 01/06/2021 CLINICAL DATA:  Spinal stenosis. EXAM: MRI CERVICAL SPINE WITHOUT CONTRAST TECHNIQUE: Multiplanar, multisequence MR imaging of the cervical spine was performed. No intravenous contrast was administered. COMPARISON:  CT same day. FINDINGS: The study suffers  from considerable motion degradation. Alignment: Straightening of the normal cervical lordosis. Vertebrae: No fracture or focal bone lesion. Cord: See below regarding stenosis. Posterior Fossa, vertebral arteries, paraspinal tissues: See results of brain MRI. Soft tissues of the neck appear unremarkable. Disc levels: Foramen magnum is widely patent.  C1-2 and C2-3 are unremarkable. C3-4: Central disc herniation effaces the ventral subarachnoid space and indents the cord. AP diameter of the canal in the midline 6.2 mm. Early abnormal T2 signal within the cord. No foraminal encroachment. C4-5: Small central disc herniation effaces the ventral subarachnoid space but does not deform the cord. AP diameter of the canal 8.2 mm. No foraminal stenosis. C5-6: Shallow disc protrusion indents the ventral subarachnoid space but does not affect cord. AP diameter of the canal 11 mm. No foraminal stenosis. C6-7: Mild noncompressive disc bulge. C7-T1: Normal interspace. 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 AP diameter of the canal 6.2 mm. Early abnormal T2 signal within the cord. Small central disc herniation at C4-5 indents the subarachnoid space but does not deform the cord or cause compressive stenosis. Non-compressive disc bulges at C5-6 and C6-7. Electronically Signed   By: Nelson Chimes M.D.   On: 01/06/2021 19:39   ECHOCARDIOGRAM COMPLETE  Result Date: 01/07/2021    ECHOCARDIOGRAM REPORT   Patient Name:   [name] Date of Exam: 01/07/2021 Medical Rec #:  [id number]  Height:       68.0 in Accession #:    [id number]       Weight:       214.3 lb Date of Birth:  06-17-1928        BSA:          2.105 m Patient Age:    46 years         BP:           131/63 mmHg Patient Gender: F                HR:           62 bpm. Exam Location:  Inpatient Procedure: 2D Echo, Cardiac Doppler and Color Doppler Indications:    CVA  History:         Patient has no prior history of Echocardiogram examinations.                 Signs/Symptoms:Syncope; Risk Factors:Hypertension.  Sonographer:    [place] Referring Phys: [id number] [place]  1. Left ventricular ejection fraction, by estimation, is 60 to 65%. The left ventricle has normal function. The left ventricle has no regional wall motion abnormalities. Left ventricular diastolic parameters are consistent with Grade I diastolic dysfunction (impaired relaxation).  2. Right ventricular systolic function is normal. The right ventricular size is normal.  3. The mitral valve is normal in structure. No evidence of mitral valve regurgitation. No evidence of mitral stenosis.  4. The aortic valve is tricuspid. Aortic valve regurgitation is not visualized. No aortic stenosis is present.  5. The inferior vena cava is normal in size with greater than 50% respiratory variability, suggesting right atrial pressure of 3 mmHg. FINDINGS  Left Ventricle: Left ventricular ejection fraction, by estimation, is 60 to 65%. The left ventricle has normal function. The left ventricle has no regional wall motion abnormalities. The left ventricular internal cavity size was normal in size. There is  no left ventricular hypertrophy. Left ventricular diastolic parameters are consistent with Grade I diastolic dysfunction (impaired relaxation). Right Ventricle: The right ventricular size is normal. Right ventricular systolic function is normal. Left Atrium: Left atrial size was normal in size. Right Atrium: Right atrial size was normal in size. Pericardium: There is no evidence of pericardial effusion. Mitral Valve: The mitral valve is normal in structure. No evidence of mitral valve regurgitation. No evidence of mitral valve stenosis. Tricuspid Valve: The tricuspid valve is normal in structure. Tricuspid valve regurgitation is trivial. No evidence of tricuspid stenosis. Aortic Valve: The aortic valve is tricuspid. Aortic valve  regurgitation is not visualized. No aortic stenosis is present. Aortic valve mean gradient measures 3.0 mmHg. Aortic valve peak gradient measures 5.9 mmHg. Pulmonic Valve: The pulmonic valve was not well visualized. Pulmonic valve regurgitation is not visualized. No evidence of pulmonic stenosis. Aorta: The aortic root is normal in size and structure. Venous: The inferior vena cava is normal in size with greater than 50% respiratory variability, suggesting right atrial pressure of 3 mmHg. IAS/Shunts: No atrial level shunt detected by color flow Doppler.  LEFT VENTRICLE PLAX 2D LVIDd:         4.20 cm     Diastology LVIDs:         2.90 cm     LV e' medial:    6.42 cm/s LV PW:         1.20 cm     LV E/e' medial:  11.3 LV IVS:  1.10 cm     LV e' lateral:   6.96 cm/s LVOT diam:     2.40 cm     LV E/e' lateral: 10.4 LV SV:         127 LV SV Index:   60 LVOT Area:     4.52 cm  LV Volumes (MOD) LV vol d, MOD A4C: 40.9 ml LV vol s, MOD A4C: 9.4 ml LV SV MOD A4C:     40.9 ml RIGHT VENTRICLE             IVC RV S prime:     11.40 cm/s  IVC diam: 1.30 cm TAPSE (M-mode): 2.1 cm LEFT ATRIUM             Index       RIGHT ATRIUM           Index LA diam:        2.60 cm 1.24 cm/m  RA Area:     15.50 cm LA Vol (A2C):   44.2 ml 21.00 ml/m RA Volume:   39.90 ml  18.96 ml/m LA Vol (A4C):   14.8 ml 7.03 ml/m LA Biplane Vol: 28.2 ml 13.40 ml/m  AORTIC VALVE                   PULMONIC VALVE AV Area (Vmax):    3.65 cm    PV Vmax:       0.57 m/s AV Area (Vmean):   3.42 cm    PV Peak grad:  1.3 mmHg AV Area (VTI):     4.76 cm AV Vmax:           121.00 cm/s AV Vmean:          83.000 cm/s AV VTI:            0.266 m AV Peak Grad:      5.9 mmHg AV Mean Grad:      3.0 mmHg LVOT Vmax:         97.50 cm/s LVOT Vmean:        62.800 cm/s LVOT VTI:          0.280 m LVOT/AV VTI ratio: 1.05  AORTA Ao Root diam: 2.90 cm Ao Asc diam:  3.30 cm MITRAL VALVE MV Area (PHT): 1.91 cm     SHUNTS MV E velocity: 72.70 cm/s   Systemic VTI:  0.28 m MV A  velocity: 100.00 cm/s  Systemic Diam: 2.40 cm MV E/A ratio:  0.73 Kirk Ruths MD Electronically signed by Kirk Ruths MD Signature Date/Time: 01/07/2021/2:38:42 PM    Final     Subjective: She is alert, report headache at site she hit head.    Discharge Exam: Vitals:   01/08/21 0423 01/08/21 0758  BP: (!) 143/78 (!) 154/80  Pulse: 63 62  Resp: 18 18  Temp: 97.6 F (36.4 C) 98 F (36.7 C)  SpO2: 100% 100%     General: Pt is alert, awake, not in acute distress Cardiovascular: RRR, S1/S2 +, no rubs, no gallops Respiratory: CTA bilaterally, no wheezing, no rhonchi Abdominal: Soft, NT, ND, bowel sounds + Extremities: no edema, no cyanosis    The results of significant diagnostics from this hospitalization (including imaging, microbiology, ancillary and laboratory) are listed below for reference.     Microbiology: Recent Results (from the past 240 hour(s))  Resp Panel by RT-PCR (Flu A&B, Covid) Nasopharyngeal Swab     Status: None   Collection Time: 01/06/21 12:37 AM  Specimen: Nasopharyngeal Swab; Nasopharyngeal(NP) swabs in vial transport medium  Result Value Ref Range Status   SARS Coronavirus 2 by RT PCR NEGATIVE NEGATIVE Final    Comment: (NOTE) SARS-CoV-2 target nucleic acids are NOT DETECTED.  The SARS-CoV-2 RNA is generally detectable in upper respiratory specimens during the acute phase of infection. The lowest concentration of SARS-CoV-2 viral copies this assay can detect is 138 copies/mL. A negative result does not preclude SARS-Cov-2 infection and should not be used as the sole basis for treatment or other patient management decisions. A negative result may occur with  improper specimen collection/handling, submission of specimen other than nasopharyngeal swab, presence of viral mutation(s) within the areas targeted by this assay, and inadequate number of viral copies(<138 copies/mL). A negative result must be combined with clinical observations, patient  history, and epidemiological information. The expected result is Negative.  Fact Sheet for Patients:  EntrepreneurPulse.com.au  Fact Sheet for Healthcare Providers:  IncredibleEmployment.be  This test is no t yet approved or cleared by the [place] FDA and  has been authorized for detection and/or diagnosis of SARS-CoV-2 by FDA under an Emergency Use Authorization (EUA). This EUA will remain  in effect (meaning this test can be used) for the duration of the COVID-19 declaration under Section 564(b)(1) of the Act, 21 U.S.C.section 360bbb-3(b)(1), unless the authorization is terminated  or revoked sooner.       Influenza A by PCR NEGATIVE NEGATIVE Final   Influenza B by PCR NEGATIVE NEGATIVE Final    Comment: (NOTE) The Xpert Xpress SARS-CoV-2/FLU/RSV plus assay is intended as an aid in the diagnosis of influenza from Nasopharyngeal swab specimens and should not be used as a sole basis for treatment. Nasal washings and aspirates are unacceptable for Xpert Xpress SARS-CoV-2/FLU/RSV testing.  Fact Sheet for Patients: EntrepreneurPulse.com.au  Fact Sheet for Healthcare Providers: IncredibleEmployment.be  This test is not yet approved or cleared by the [place] FDA and has been authorized for detection and/or diagnosis of SARS-CoV-2 by FDA under an Emergency Use Authorization (EUA). This EUA will remain in effect (meaning this test can be used) for the duration of the COVID-19 declaration under Section 564(b)(1) of the Act, 21 U.S.C. section 360bbb-3(b)(1), unless the authorization is terminated or revoked.  Performed at [facility], [address] [place], [place] [address]      Labs: BNP (last 3 results) No results for input(s): BNP in the last 8760 hours. Basic Metabolic Panel: Recent Labs  Lab 01/06/21 0037 01/07/21 0417  NA 139 136  K 3.6 3.8  CL 106 106  CO2  25 23  GLUCOSE 123* 105*  BUN 9 6*  CREATININE 0.85 1.00  CALCIUM 9.3 8.6*   Liver Function Tests: Recent Labs  Lab 01/06/21 0037  AST 11*  ALT 8  ALKPHOS 52  BILITOT 0.9  PROT 6.6  ALBUMIN 3.9   No results for input(s): LIPASE, AMYLASE in the last 168 hours. No results for input(s): AMMONIA in the last 168 hours. CBC: Recent Labs  Lab 01/06/21 0037 01/07/21 0417  WBC 6.0 4.6  NEUTROABS 3.3 2.2  HGB 13.4 12.8  HCT 40.8 38.2  MCV 96.2 96.0  PLT 217 186   Cardiac Enzymes: No results for input(s): CKTOTAL, CKMB, CKMBINDEX, TROPONINI in the last 168 hours. BNP: Invalid input(s): POCBNP CBG: No results for input(s): GLUCAP in the last 168 hours. D-Dimer No results for input(s): DDIMER in the last 72 hours. Hgb A1c Recent Labs    01/07/21 0417  HGBA1C 5.7*   Lipid Profile Recent Labs    01/07/21 0417  CHOL 131  HDL 68  LDLCALC 53  TRIG 51  CHOLHDL 1.9   Thyroid function studies No results for input(s): TSH, T4TOTAL, T3FREE, THYROIDAB in the last 72 hours.  Invalid input(s): FREET3 Anemia work up No results for input(s): VITAMINB12, FOLATE, FERRITIN, TIBC, IRON, RETICCTPCT in the last 72 hours. Urinalysis    Component Value Date/Time   COLORURINE YELLOW 01/06/2021 0103   APPEARANCEUR CLEAR 01/06/2021 0103   LABSPEC 1.023 01/06/2021 0103   PHURINE 5.5 01/06/2021 0103   GLUCOSEU NEGATIVE 01/06/2021 0103   HGBUR NEGATIVE 01/06/2021 0103   BILIRUBINUR NEGATIVE 01/06/2021 0103   KETONESUR TRACE (A) 01/06/2021 0103   PROTEINUR TRACE (A) 01/06/2021 0103   UROBILINOGEN 1.0 11/07/2009 2127   NITRITE NEGATIVE 01/06/2021 0103   LEUKOCYTESUR NEGATIVE 01/06/2021 0103   Sepsis Labs Invalid input(s): PROCALCITONIN,  WBC,  LACTICIDVEN Microbiology Recent Results (from the past 240 hour(s))  Resp Panel by RT-PCR (Flu A&B, Covid) Nasopharyngeal Swab     Status: None   Collection Time: 01/06/21 12:37 AM   Specimen: Nasopharyngeal Swab; Nasopharyngeal(NP) swabs  in vial transport medium  Result Value Ref Range Status   SARS Coronavirus 2 by RT PCR NEGATIVE NEGATIVE Final    Comment: (NOTE) SARS-CoV-2 target nucleic acids are NOT DETECTED.  The SARS-CoV-2 RNA is generally detectable in upper respiratory specimens during the acute phase of infection. The lowest concentration of SARS-CoV-2 viral copies this assay can detect is 138 copies/mL. A negative result does not preclude SARS-Cov-2 infection and should not be used as the sole basis for treatment or other patient management decisions. A negative result may occur with  improper specimen collection/handling, submission of specimen other than nasopharyngeal swab, presence of viral mutation(s) within the areas targeted by this assay, and inadequate number of viral copies(<138 copies/mL). A negative result must be combined with clinical observations, patient history, and epidemiological information. The expected result is Negative.  Fact Sheet for Patients:  EntrepreneurPulse.com.au  Fact Sheet for Healthcare Providers:  IncredibleEmployment.be  This test is no t yet approved or cleared by the [place] FDA and  has been authorized for detection and/or diagnosis of SARS-CoV-2 by FDA under an Emergency Use Authorization (EUA). This EUA will remain  in effect (meaning this test can be used) for the duration of the COVID-19 declaration under Section 564(b)(1) of the Act, 21 U.S.C.section 360bbb-3(b)(1), unless the authorization is terminated  or revoked sooner.       Influenza A by PCR NEGATIVE NEGATIVE Final   Influenza B by PCR NEGATIVE NEGATIVE Final    Comment: (NOTE) The Xpert Xpress SARS-CoV-2/FLU/RSV plus assay is intended as an aid in the diagnosis of influenza from Nasopharyngeal swab specimens and should not be used as a sole basis for treatment. Nasal washings and aspirates are unacceptable for Xpert Xpress  SARS-CoV-2/FLU/RSV testing.  Fact Sheet for Patients: EntrepreneurPulse.com.au  Fact Sheet for Healthcare Providers: IncredibleEmployment.be  This test is not yet approved or cleared by the [place] FDA and has been authorized for detection and/or diagnosis of SARS-CoV-2 by FDA under an Emergency Use Authorization (EUA). This EUA will remain in effect (meaning this test can be used) for the duration of the COVID-19 declaration under Section 564(b)(1) of the Act, 21 U.S.C. section 360bbb-3(b)(1), unless the authorization is terminated or revoked.  Performed at [facility], [address] [place], [place] [address]      Time coordinating discharge:  40 minutes  SIGNED:   Elmarie Shiley, MD  [facility]

## 2021-01-08 NOTE — Progress Notes (Signed)
AVS reviewed with patient/family. All questions answered at this time.Family will provide transport.

## 2021-01-08 NOTE — Plan of Care (Signed)
  Problem: Education: Goal: Knowledge of General Education information will improve Description: Including pain rating scale, medication(s)/side effects and non-pharmacologic comfort measures Outcome: Progressing   Problem: Health Behavior/Discharge Planning: Goal: Ability to manage health-related needs will improve Outcome: Progressing   Problem: Clinical Measurements: Goal: Ability to maintain clinical measurements within normal limits will improve Outcome: Progressing Goal: Will remain free from infection Outcome: Progressing Goal: Diagnostic test results will improve Outcome: Progressing Goal: Respiratory complications will improve Outcome: Progressing Goal: Cardiovascular complication will be avoided Outcome: Progressing   Problem: Activity: Goal: Risk for activity intolerance will decrease Outcome: Progressing   Problem: Nutrition: Goal: Adequate nutrition will be maintained Outcome: Progressing   Problem: Coping: Goal: Level of anxiety will decrease Outcome: Progressing   Problem: Elimination: Goal: Will not experience complications related to bowel motility Outcome: Progressing Goal: Will not experience complications related to urinary retention Outcome: Progressing   Problem: Pain Managment: Goal: General experience of comfort will improve Outcome: Progressing   Problem: Safety: Goal: Ability to remain free from injury will improve Outcome: Progressing   Problem: Skin Integrity: Goal: Risk for impaired skin integrity will decrease Outcome: Progressing   Problem: Education: Goal: Knowledge of disease or condition will improve Outcome: Progressing Goal: Knowledge of secondary prevention will improve Outcome: Progressing Goal: Knowledge of patient specific risk factors addressed and post discharge goals established will improve Outcome: Progressing   Problem: Coping: Goal: Will verbalize positive feelings about self Outcome: Progressing Goal: Will  identify appropriate support needs Outcome: Progressing   Problem: Health Behavior/Discharge Planning: Goal: Ability to manage health-related needs will improve Outcome: Progressing   Problem: Self-Care: Goal: Ability to participate in self-care as condition permits will improve Outcome: Progressing Goal: Verbalization of feelings and concerns over difficulty with self-care will improve Outcome: Progressing Goal: Ability to communicate needs accurately will improve Outcome: Progressing   Problem: Nutrition: Goal: Risk of aspiration will decrease Outcome: Progressing Goal: Dietary intake will improve Outcome: Progressing   Problem: Intracerebral Hemorrhage Tissue Perfusion: Goal: Complications of Intracerebral Hemorrhage will be minimized Outcome: Progressing   Problem: Ischemic Stroke/TIA Tissue Perfusion: Goal: Complications of ischemic stroke/TIA will be minimized Outcome: Progressing   Problem: Spontaneous Subarachnoid Hemorrhage Tissue Perfusion: Goal: Complications of Spontaneous Subarachnoid Hemorrhage will be minimized Outcome: Progressing

## 2021-01-08 NOTE — Plan of Care (Signed)
  Problem: Education: Goal: Knowledge of General Education information will improve Description: Including pain rating scale, medication(s)/side effects and non-pharmacologic comfort measures Outcome: Adequate for Discharge   

## 2021-01-08 NOTE — Discharge Instructions (Signed)
You need to take Aspirin and plavix for 3 Months then aspirin alone.

## 2021-01-08 NOTE — TOC Transition Note (Signed)
Transition of Care [facility]) - CM/SW Discharge Note   Patient Details  Name: [name] MRN: [id number] Date of Birth: Aug 16, 1928  Transition of [facility] CM/SW Contact:  Pollie Friar, RN Phone Number: 01/08/2021, 10:42 AM   Clinical Narrative:    Patient discharging home with daughter today. She will attend [facility] with information on the AVS. DME for home has been delivered to the room. Aide forms sent into [employer].  SCAT forms faxed in and transportation through [facility] set up.  Daughter providing transport home.   Final next level of care: OP Rehab Barriers to Discharge: No Barriers Identified   Patient Goals and CMS Choice   CMS Medicare.gov Compare Post Acute Care list provided to:: Patient Represenative (must comment) Choice offered to / list presented to : Adult Children (daughter)  Discharge Placement                       Discharge Plan and Services   Discharge Planning Services: CM Consult Post Acute Care Choice: Durable Medical Equipment          DME Arranged: 3-N-1, Tub bench, Lightweight manual wheelchair with seat cushion DME Agency: [employer] Date DME Agency Contacted: 01/07/21   Representative spoke with at DME Agency: [place] Determinants of Health ([place]) Interventions     Readmission Risk Interventions No flowsheet data found.

## 2021-01-11 ENCOUNTER — Other Ambulatory Visit: Payer: Self-pay | Admitting: *Deleted

## 2021-01-11 NOTE — Patient Outreach (Signed)
[name] [facility]) Care Management  01/11/2021  [name] 12-Sep-1928 [id number]   RED ON EMMI ALERT - Stroke Day # 9/25 Date: 1 Red Alert Reason: Questions about medications and Didn't schedule follow up appointment   Outreach attempt #1, successful but member state she is on her way to her PCP office.  She and/or daughter will call back once they are back home.   Plan: RN CM will await call back, if no call back will send outreach letter and follow up within the next 3-4 business days.  [name], [place], MSN [place] [phone]

## 2021-01-14 ENCOUNTER — Other Ambulatory Visit: Payer: Self-pay | Admitting: *Deleted

## 2021-01-14 NOTE — Patient Outreach (Addendum)
[facility]) Care Management  01/14/2021  [name] Sep 14, 1928 [id number]    RED ON EMMI ALERT - Stroke Day # 9/25 Date: 1 Red Alert Reason: Questions about medications and Didn't schedule follow up appointment     Outreach attempt #2, successful to daughter [name].  Identity verified.  This care manager introduced self and stated purpose of call.  [facility] care management services explained.    State she lives with member and is her caretaker.  She has now obtained all discharge medications, taking as instructed.  She will have PT/OT evaluation tomorrow, was seen by PCP on 9/26. Neurology appointment scheduled for 11/10, daughter will provide transportation.  Member unable to perform ADL's, daughter report she has been approved for [facility], will use [facility], qualified for 80 hours/month.  She has the help of friends and family until services start in the next couple weeks.  She has DME in the home (wheelchair, shower chair, walker, and BSC).  Discussed possibly needing shower changed out to better fit member's current needs, provided with [employer].    Daughter state member will have virtual visit with [facility] next week to establish CM services.  Denies any urgent concerns, encouraged to contact this care manager with questions.   Plan: RN CM will close case at this time as no further needs identified.  Daughter will call this care manager with questions/concerns.  Valente David, [place], MSN [place] [phone]

## 2021-01-15 ENCOUNTER — Ambulatory Visit: Payer: Medicare Other

## 2021-01-15 ENCOUNTER — Encounter: Payer: Self-pay | Admitting: Occupational Therapy

## 2021-01-15 ENCOUNTER — Ambulatory Visit: Payer: Medicare Other | Attending: Internal Medicine | Admitting: Occupational Therapy

## 2021-01-15 ENCOUNTER — Telehealth: Payer: Self-pay

## 2021-01-15 ENCOUNTER — Ambulatory Visit: Payer: Medicare Other | Admitting: Podiatry

## 2021-01-15 ENCOUNTER — Other Ambulatory Visit: Payer: Self-pay

## 2021-01-15 DIAGNOSIS — R208 Other disturbances of skin sensation: Secondary | ICD-10-CM

## 2021-01-15 DIAGNOSIS — M6281 Muscle weakness (generalized): Secondary | ICD-10-CM | POA: Diagnosis present

## 2021-01-15 DIAGNOSIS — R41840 Attention and concentration deficit: Secondary | ICD-10-CM | POA: Insufficient documentation

## 2021-01-15 DIAGNOSIS — R41842 Visuospatial deficit: Secondary | ICD-10-CM | POA: Diagnosis present

## 2021-01-15 DIAGNOSIS — R2681 Unsteadiness on feet: Secondary | ICD-10-CM

## 2021-01-15 DIAGNOSIS — R2689 Other abnormalities of gait and mobility: Secondary | ICD-10-CM

## 2021-01-15 DIAGNOSIS — R278 Other lack of coordination: Secondary | ICD-10-CM | POA: Insufficient documentation

## 2021-01-15 NOTE — Telephone Encounter (Signed)
   [name] DOB: 22-Oct-1928 MRN: [id number]   RIDER WAIVER AND RELEASE OF LIABILITY  For purposes of improving physical access to our facilities, [place] is pleased to partner with third parties to provide [place] patients or other authorized individuals the option of convenient, on-demand ground transportation services (the Technical brewer") through use of the technology service that enables users to request on-demand ground transportation from independent third-party providers.  By opting to use and accept these [employer], I, the undersigned, hereby agree on behalf of myself, and on behalf of any minor child using the Government social research officer for whom I am the parent or legal guardian, as follows:  Government social research officer provided to me are provided by independent third-party transportation providers who are not [employer] or employees and who are unaffiliated with [employer]. [place] is neither a transportation carrier nor a common or public carrier. [place] has no control over the quality or safety of the transportation that occurs as a result of the [employer]. [place] cannot guarantee that any third-party transportation provider will complete any arranged transportation service. [place] makes no representation, warranty, or guarantee regarding the reliability, timeliness, quality, safety, suitability, or availability of any of the Transport Services or that they will be error free. I fully understand that traveling by vehicle involves risks and dangers of serious bodily injury, including permanent disability, paralysis, and death. I agree, on behalf of myself and on behalf of any minor child using the Transport Services for whom I am the parent or legal guardian, that the entire risk arising out of my use of the [employer] remains solely with me, to the maximum extent permitted under applicable law. The [employer] are provided "as  is" and "as available." [place] disclaims all representations and warranties, express, implied or statutory, not expressly set out in these terms, including the implied warranties of merchantability and fitness for a particular purpose. I hereby waive and release [place], its agents, employees, officers, directors, representatives, insurers, attorneys, assigns, successors, subsidiaries, and affiliates from any and all past, present, or future claims, demands, liabilities, actions, causes of action, or suits of any kind directly or indirectly arising from acceptance and use of the [employer]. I further waive and release [place] and its affiliates from all present and future liability and responsibility for any injury or death to persons or damages to property caused by or related to the use of the [employer]. I have read this Waiver and Release of Liability, and I understand the terms used in it and their legal significance. This Waiver is freely and voluntarily given with the understanding that my right (as well as the right of any minor child for whom I am the parent or legal guardian using the [employer]) to legal recourse against  in connection with the [employer] is knowingly surrendered in return for use of these services.   I attest that I read the consent document to [name], gave Ms. Jenson the opportunity to ask questions and answered the questions asked (if any). I affirm that [name] then provided consent for she's participation in this program.     Drucie Ip

## 2021-01-15 NOTE — Therapy (Signed)
[facility] [address] [place], [place], [address] Phone: [phone]   Fax:  [phone]  Physical Therapy Evaluation  Patient Details  Name: [name] MRN: [id number] Date of Birth: 08-27-1928 Referring Provider (PT): Niel Hummer MD   Encounter Date: 01/15/2021   PT End of Session - 01/15/21 1004     Visit Number 1    Number of Visits 17    Date for PT Re-Evaluation 03/19/21    Authorization Type UHC MCR    Authorization Time Period 01/15/21-03/19/21    PT Start Time 0900    PT Stop Time 0930    PT Time Calculation (min) 30 min    Equipment Utilized During Treatment Gait belt    Activity Tolerance Patient tolerated treatment well    Behavior During Therapy [facility] for tasks assessed/performed             Past Medical History:  Diagnosis Date   Allergic rhinitis    Arthritis    Carpal tunnel syndrome    Deep venous thrombosis (HCC)    bilateral legs   Degenerative arthritis    right knee   Hypertension    Lumbar degenerative disc disease    Lump or mass in breast    Paresthesia    Syncope 05/24/2013   Vitamin D deficiency     Past Surgical History:  Procedure Laterality Date   ABDOMINAL HYSTERECTOMY     CARPAL TUNNEL RELEASE Right    CATARACT EXTRACTION Bilateral    HEMORROIDECTOMY     IVC Filter     TOTAL HIP ARTHROPLASTY Right    ULNAR NERVE TRANSPOSITION Right     There were no vitals filed for this visit.    Subjective Assessment - 01/15/21 0903     Subjective Describes a history of LLE weakness and sensation loss since CVA on 01/05/21.  Denies pain but is too weak to resume her previous level of function,  Used to ambulate househld ditances with cane.    Patient is accompained by: Family member    Pertinent History 87 y.o.  female presents to ED 9/20 after having 2 falls at home. c/o bilateral hand numbness and weakness and has progressed to L LE weakness. Found to have pulse  55-67, blood pressures elevated up to 207/70, and all other vital signs maintained as well as decreased L sided sensation and strength, and L sided dysmetria CT scan of the head and cervical spine noted chronic atrophy and ischemic changes with no acute abnormality noted and multilevel degenerative changes of the cervical spine. CT angiogram of the head and neck note severe high-grade stenosis of the distal right M1 segment/right MCA bifurcation. Labs significant for INR 1.3, and all other labs relatively within normal limits. Admitted for treatment of  simultaneous likely cervical spine compression as of 9/19 (with symptoms relatively stable), and a superimposed right MCA stroke  PMH: hypertension, DVT of bilateral legs, and arthritis    How long can you sit comfortably? >30 min    How long can you stand comfortably? <5 min    How long can you walk comfortably? <10 min    Patient Stated Goals To return to household ambulation with cane    Currently in Pain? No/denies                [facility] PT Assessment - 01/15/21 1023       Assessment   Medical Diagnosis R MCA CVA    Referring  Provider (PT) Niel Hummer MD    Onset Date/Surgical Date 01/05/21    Hand Dominance Right    Prior Therapy PT Eval CIR      Precautions   Precautions Fall      Balance Screen   Has the patient fallen in the past 6 months Yes    How many times? 2    Has the patient had a decrease in activity level because of a fear of falling?  Yes    Is the patient reluctant to leave their home because of a fear of falling?  No      Home Environment   Living Environment Private residence    Living Arrangements Children    Available Help at Discharge Family    Type of [place] Access Level entry    [place] - single point;Wheelchair - manual      Prior Function   Level of Independence Independent    Vocation Retired      Observation/Other Assessments   Focus on  Therapeutic Outcomes (FOTO)  60      Sensation   Light Touch Impaired by gross assessment    Additional Comments decreased in L foreleg      Coordination   Gross Motor Movements are Fluid and Coordinated Yes      Strength   Overall Strength Comments 4/5 RLE strength throughout    Right/Left Hip Left    Left Hip Flexion 3+/5    Left Hip Extension 3+/5    Left Hip ABduction 3+/5    Right/Left Knee Left    Left Knee Flexion 3+/5    Left Knee Extension 3+/5    Right/Left Ankle Left    Left Ankle Dorsiflexion 3+/5      Transfers   Transfers Sit to Stand;Stand to Sit    Sit to Stand 4: Min guard;4: Min assist    Stand to Sit 4: Min guard;4: Min assist    Comments cues for hand placement and safety      Ambulation/Gait   Ambulation/Gait Yes    Ambulation/Gait Assistance 4: Min guard;4: Min Engineer, drilling (Feet) 50 Feet    Assistive device Rolling walker    Gait Pattern Step-to pattern;Step-through pattern;Decreased step length - left;Decreased weight shift to right;Trunk flexed    Ambulation Surface Level;Indoor    Gait Comments verba and tactile cues needed for LLE swing through phase                        Objective measurements completed on examination: See above findings.                PT Education - 01/15/21 1003     Education Details Discussed eval findings, rehab potential and POC and patient and CGs are in agreement    Person(s) Educated Patient;Caregiver(s)    Methods Explanation    Comprehension Verbalized understanding              PT Short Term Goals - 01/15/21 1036       PT SHORT TERM GOAL #1   Title Patient and CGs to demo HEP back to PT w/o cuing    Baseline TBD    Time 4    Period Weeks    Status New    Target Date 02/19/21      PT SHORT TERM GOAL #2   Title  patient will transfer STS and stand pivot with SBA    Baseline MinA/CGA for transfers    Time 4    Period Weeks    Status New    Target Date  02/19/21      PT SHORT TERM GOAL #3   Title Patient to ambulate 119ft with RW and CGA    Baseline 28ft with RW and MinA/CGA across level ground    Time 4    Period Weeks    Status New    Target Date 02/19/21      PT SHORT TERM GOAL #4   Title Assess bed mobility and set goal as appropriate    Baseline TBD    Time 4    Period Weeks    Status New    Target Date 02/19/21               PT Long Term Goals - 01/15/21 1040       PT LONG TERM GOAL #1   Title Patient to ambulate 223ft with LRAD and SBA    Baseline 101ft with RW and CGA/MinA    Time 8    Period Weeks    Status New    Target Date 03/19/21      PT LONG TERM GOAL #2   Title patient to present with 4/5 LLE strength in DF, ABD, hip and knee EXT    Baseline 3+/5 strength throughout    Time 8    Period Weeks    Status New    Target Date 03/19/21      PT LONG TERM GOAL #3   Title Patient to perform all transfers under S    Baseline STS transfers from [facility] with CGA/minA    Time 8    Period Weeks    Status New    Target Date 03/19/21      PT LONG TERM GOAL #4   Title Patient and CGs to demo appropriate home program to maintain mobility and transfer skills    Baseline Currently requires assist for transfers and mobility, HEP TBD    Time 8    Period Weeks    Status New    Target Date 03/19/21                    Plan - 01/15/21 1005     Clinical Impression Statement (15 min. late starting eval due to weather) Patient referred to [facility] following mild CVA on 01/05/21.  She had a PT Evaluation but no f/u treatments.  She lives with family in a single story home and has a cane and WC to use.  Prior to CVA, patient was and I household ambulator.  She is able to transfer STS with UE support and CGA, once standing she s able to ambulate 39ft wit a RW and CGA/MinA to facilitate R WS to allow L swing through.  Patient has decreased sensation and proprioception in LLE and requires VCs and TCs for swing through.   Mild sterngth deficits noted throughout LLE and processing delays suspected.    Personal Factors and Comorbidities Age;Comorbidity 1;Transportation    Comorbidities CVA    Examination-Activity Limitations Locomotion Level;Transfers;Stand    Stability/Clinical Decision Making Stable/Uncomplicated    Clinical Decision Making Low    Rehab Potential Good    PT Frequency 2x / week    PT Duration 8 weeks    PT Treatment/Interventions ADLs/Self Care Home Management;DME Instruction;Gait training;Stair training;Functional mobility training;Therapeutic activities;Therapeutic exercise;Balance training;Neuromuscular  re-education;Patient/family education    PT Next Visit Plan Assess TUG, establish HEP, transfer training    PT Home Exercise Plan TBD    Recommended Other Services OT pending    Consulted and Agree with Plan of Care Patient;Family member/caregiver             Patient will benefit from skilled therapeutic intervention in order to improve the following deficits and impairments:  Abnormal gait, Decreased coordination, Difficulty walking, Decreased endurance, Decreased safety awareness, Decreased activity tolerance, Decreased balance, Decreased mobility, Decreased strength, Impaired sensation  Visit Diagnosis: Unsteadiness on feet  Other disturbances of skin sensation  Muscle weakness (generalized)  Other abnormalities of gait and mobility     Problem List Patient Active Problem List   Diagnosis Date Noted   CVA (cerebral vascular accident) ([place]) 01/06/2021   Essential hypertension 01/06/2021   Arthritis 01/06/2021   Pain due to onychomycosis of toenails of both feet 12/14/2018   Presbycusis of both ears 09/22/2015   Syncope 05/24/2013    Lanice Shirts, PT 01/15/2021, 10:47 AM  [facility] [address] [place], [place], [address] Phone: [phone]   Fax:  [phone]  Name: [name] MRN:  [id number] Date of Birth: Nov 20, 1928

## 2021-01-15 NOTE — Therapy (Addendum)
[facility] [address] [place], [place], [address] Phone: [phone]   Fax:  [phone]  Occupational Therapy Evaluation  Patient Details  Name: [name] MRN: [id number] Date of Birth: 07/08/1928 Referring Provider (OT): Niel Hummer, MD   Encounter Date: 01/15/2021   OT End of Session - 01/15/21 1156     Visit Number 1    Number of Visits 17    Date for OT Re-Evaluation 03/26/21   16 visits over 10 weeks   Authorization Type UHC Medicare / Medicaid    Authorization Time Period Follow Medicare Guidelines    Progress Note Due on Visit 10    OT Start Time 0930    OT Stop Time 1015    OT Time Calculation (min) 45 min    Activity Tolerance Patient tolerated treatment well    Behavior During Therapy [facility] for tasks assessed/performed             Past Medical History:  Diagnosis Date   Allergic rhinitis    Arthritis    Carpal tunnel syndrome    Deep venous thrombosis (HCC)    bilateral legs   Degenerative arthritis    right knee   Hypertension    Lumbar degenerative disc disease    Lump or mass in breast    Paresthesia    Syncope 05/24/2013   Vitamin D deficiency     Past Surgical History:  Procedure Laterality Date   ABDOMINAL HYSTERECTOMY     CARPAL TUNNEL RELEASE Right    CATARACT EXTRACTION Bilateral    HEMORROIDECTOMY     IVC Filter     TOTAL HIP ARTHROPLASTY Right    ULNAR NERVE TRANSPOSITION Right     There were no vitals filed for this visit.   Subjective Assessment - 01/15/21 0921     Subjective  Pt is a 87 year old  female presenting to Neuro OPOT s/p 2 falls at home and admitted on 01/06/21. Pt admitted with c/o bilateral hand numbness and weakness and progressed to L LE weakness. Pt currently reporting numbness in BUE and can't lift and hold things . PMH significant for hypertension, DVT of bilateral legs, and arthritis. Pt lives with daughter and was independent prior to CVA  and driving. Pt's daughter is now assisting with self-care and home management. Pt reports goal "to ge back to where I was - be able to do for myself"    Patient is accompanied by: Family member   daughter   Pertinent History PMH: hypertension, DVT of bilateral legs, and arthritis    Patient Stated Goals "to ge back to where I was - be able to do for myself"    Currently in Pain? Yes    Pain Score 8     Pain Location Head    Pain Descriptors / Indicators Headache    Pain Type Acute pain    Pain Onset 1 to 4 weeks ago    Pain Frequency Constant               [facility] OT Assessment - 01/15/21 0935       Assessment   Medical Diagnosis R MCA CVA    Referring Provider (OT) Niel Hummer, MD    Onset Date/Surgical Date 01/06/21    Hand Dominance Right      Precautions   Precautions Fall      Balance Screen   Has the patient fallen in the past 6 months Yes  How many times? 2 falls prior to admission      Home  Environment   Family/patient expects to be discharged to: Private residence    [place]   lives with daughter   Available Help at Discharge Family    Type of [place] One level    Bathroom Shower/Tub Tub/Shower unit    [place] held shower head;Tub bench;Walker - 2 wheels;Wheelchair - [employer]      Prior Function   Level of Independence Independent    Vocation Retired    Leisure crafts (crochets, Firefighter)      ADL   Eating/Feeding Modified independent   needs help with cutting things, spreading butter etc   Grooming Maximal assistance    Upper Body Bathing Maximal assistance   sponge baths   Lower Body Bathing Maximal assistance   sponge baths   Upper Body Dressing Maximal assistance   pt reports helping   Lower Body Dressing Maximal assistance    Toilet Transfer Moderate assistance    Toileting - Clothing Manipulation Moderate assistance    Toileting -  Hygiene Maximal assistance    Tub/Shower  Transfer Moderate assistance   reports doing sponge baths currently   Data processing manager tub bench    ADL comments independent with all ADLs and IADLs prior to 11/04/20      IADL   Shopping Completely unable to shop    Light Housekeeping Does not participate in any housekeeping tasks    Meal Prep Does not utilize stove or oven;Needs to have meals prepared and served    [employer] on family or friends for transportation    Medication Management Is not capable of dispensing or managing own medication    Financial Management Dependent      Written Expression   Dominant Hand Right      Vision - History   Baseline Vision Wears glasses only for reading    Additional Comments pt denies any changes in vision      Observation/Other Assessments   Focus on Therapeutic Outcomes (FOTO)  52   predicted outcome: 68%     Sensation   Light Touch Impaired by gross assessment    Hot/Cold Impaired by gross assessment    Proprioception Impaired by gross assessment      Coordination   9 Hole Peg Test Right;Left    Right 9 Hole Peg Test 41.94s    Left 9 Hole Peg Test 1 peg in 2 minutes      Perception   Perception Impaired    Inattention/Neglect Does not attend to left visual field      Strength   Overall Strength Comments Grossly 3+/5 BUE      Hand Function   Right Hand Grip (lbs) 41.4 lbs    Left Hand Grip (lbs) 36.8 lbs                                OT Short Term Goals - 01/15/21 1227       OT SHORT TERM GOAL #1   Title Pt will be independent with HEP for coordination    Time 4    Period Weeks    Status New    Target Date 02/12/21      OT SHORT TERM GOAL #2   Title Pt will perform bathing with min A    Baseline max  A    Time 10    Period Weeks    Status New      OT SHORT TERM GOAL #3   Title Pt will perform UB dressing with min A    Baseline max A    Time 4    Period Weeks    Status New      OT SHORT TERM GOAL  #4   Title Pt will increase coordination in LUE to placing 9 pegs in 2 minutes with LUE in 9-hole peg test    Baseline 1 peg in  2 minutes with LUE    Time 4    Period Weeks    Status New      OT SHORT TERM GOAL #5   Title Pt will perform simple cold meal prep, snack prep and/or light home management with supervision.    Baseline not completing    Time 4    Period Weeks    Status New               OT Long Term Goals - 01/15/21 1230       OT LONG TERM GOAL #1   Title Pt will be independent with any updated HEP    Time 10    Period Weeks    Status New    Target Date 03/26/21      OT LONG TERM GOAL #2   Title Pt will complete 9 hole peg test with LUE in 2 minutes or less for increase in fine motor coordination with LUE    Baseline 1 peg in 2 minutes LUE    Time 10    Period Weeks    Status New      OT LONG TERM GOAL #3   Title Pt will complete basic ADLs with set up/supervision.    Baseline max A    Time 10    Period Weeks    Status New      OT LONG TERM GOAL #4   Title Pt will verbalize understanding of strategies for left inattention and/or visual field cut    Time 10    Period Weeks    Status New      OT LONG TERM GOAL #5   Title Pt will verbalize sensory strategies for increased safety with ADLs and IADLs.    Time 10    Period Weeks    Status New                   Plan - 01/15/21 1218     Clinical Impression Statement Pt is a 87 year old female presenting to Neuro OPOT s/p 2 falls at home and admitted on 01/06/21. Pt admitted with c/o bilateral hand numbness and weakness and progressed to L LE weakness. PMH significant for hypertension, DVT of bilateral legs, and arthritis. Pt presents with LUE decreased coordination, impaired proximal strength, left inattention and/or visual field cut and unstediness on feet impeding overall independnce with ADLs. Skilled occupational therapy is recommended to target listed areas of deficit and increase  independence with ADLs and IADLs and decrease caregiver burden.    OT Occupational Profile and History Problem Focused Assessment - Including review of records relating to presenting problem    Occupational performance deficits (Please refer to evaluation for details): IADL's;ADL's;Leisure    Body Structure / Function / Physical Skills ADL;UE functional use;Pain;Strength;IADL;ROM;FMC;Vision;Sensation;Coordination;Dexterity;GMC;Decreased knowledge of use of DME    Rehab Potential Good    Clinical Decision Making Limited treatment  options, no task modification necessary    Comorbidities Affecting Occupational Performance: None    Modification or Assistance to Complete Evaluation  No modification of tasks or assist necessary to complete eval    OT Frequency 2x / week    OT Duration Other (comment)   16 visits over 10 weeks   OT Treatment/Interventions Self-care/ADL training;Fluidtherapy;DME and/or AE instruction;Therapeutic activities;Cognitive remediation/compensation;Visual/perceptual remediation/compensation;Passive range of motion;Patient/family education;Manual Therapy;Energy conservation;Neuromuscular education;Paraffin;Moist Heat;Functional Mobility Training;Therapeutic exercise    Plan coordination exercises, continue to address and assess left inattention/field, ADL reeducation    Consulted and Agree with Plan of Care Family member/caregiver;Patient    Family Member Consulted daughter             Patient will benefit from skilled therapeutic intervention in order to improve the following deficits and impairments:   Body Structure / Function / Physical Skills: ADL, UE functional use, Pain, Strength, IADL, ROM, FMC, Vision, Sensation, Coordination, Dexterity, GMC, Decreased knowledge of use of DME     Managed medicaid CPT codes: [phone]- Therapeutic Exercise, [phone]- Neuro Re-education, 97140 - Manual Therapy, 97530 - Therapeutic Activities, 97535 - Self Care, [phone] - Electrical stimulation  (Manual), [id number] - Ultrasound, [id number] - Orthotic Fit, [id number] - Fluidotherapy, and [id number] -  Paraffin   Visit Diagnosis: Other lack of coordination  Unsteadiness on feet  Muscle weakness (generalized)  Visuospatial deficit  Attention and concentration deficit    Problem List Patient Active Problem List   Diagnosis Date Noted   CVA (cerebral vascular accident) ([place]) 01/06/2021   Essential hypertension 01/06/2021   Arthritis 01/06/2021   Pain due to onychomycosis of toenails of both feet 12/14/2018   Presbycusis of both ears 09/22/2015   Syncope 05/24/2013    Zachery Conch, OT/L 01/15/2021, 2:11 PM  [facility] [address] [place], [place], [address] Phone: [phone]   Fax:  [phone]  Name: [name] MRN: [id number] Date of Birth: [birth year]

## 2021-01-19 ENCOUNTER — Ambulatory Visit: Payer: Medicare Other | Admitting: Occupational Therapy

## 2021-01-19 ENCOUNTER — Ambulatory Visit: Payer: Medicare Other | Attending: Internal Medicine | Admitting: Physical Therapy

## 2021-01-19 ENCOUNTER — Other Ambulatory Visit: Payer: Self-pay

## 2021-01-19 ENCOUNTER — Other Ambulatory Visit: Payer: Self-pay | Admitting: *Deleted

## 2021-01-19 VITALS — BP 138/76

## 2021-01-19 DIAGNOSIS — R41842 Visuospatial deficit: Secondary | ICD-10-CM | POA: Diagnosis present

## 2021-01-19 DIAGNOSIS — R2689 Other abnormalities of gait and mobility: Secondary | ICD-10-CM | POA: Insufficient documentation

## 2021-01-19 DIAGNOSIS — R2681 Unsteadiness on feet: Secondary | ICD-10-CM

## 2021-01-19 DIAGNOSIS — M6281 Muscle weakness (generalized): Secondary | ICD-10-CM | POA: Insufficient documentation

## 2021-01-19 DIAGNOSIS — R208 Other disturbances of skin sensation: Secondary | ICD-10-CM | POA: Insufficient documentation

## 2021-01-19 DIAGNOSIS — R278 Other lack of coordination: Secondary | ICD-10-CM

## 2021-01-19 DIAGNOSIS — R41840 Attention and concentration deficit: Secondary | ICD-10-CM | POA: Insufficient documentation

## 2021-01-19 NOTE — Patient Outreach (Signed)
[name]  01/19/2021  [name] 12/16/1928 [id number]   RED ON EMMI ALERT - Stroke Day # 9 Date: 10/3 Red Alert Reason: Feeling worse overall and New problems with walking/speaking/talking/seeing   Outreach attempt #1, successful to member and daughter.  They express frustration regarding ease of automated system.  Deny member has new problems with walking/talking/speaking/seeing.  She is starting to get feeling back in her left side, however it is causing pain.  Daughter has been providing Tylenol instead of narcotics so member will be able to remain active in therapy sessions.  They will discuss pain management with provider.   Daughter expresses concern about difficulty getting member in/out of the bed safely.  Member is now approved for PCS but they are still waiting for services to start.  Daughter feel she would be able to maneuver member better if she was in hospital bed.  Call placed to PCP office, request made to have order placed to DME for hospital bed.     Plan: RN CM will follow up within the next week.    Valente David, [place], MSN [place] [phone]

## 2021-01-19 NOTE — Therapy (Signed)
[facility] [address] [place], [place], [address] Phone: [phone]   Fax:  [phone]  Physical Therapy Treatment  Patient Details  Name: [name] MRN: [id number] Date of Birth: [birth year] Referring Provider (PT): Niel Hummer MD   Encounter Date: 01/19/2021   PT End of Session - 01/19/21 0737     Visit Number 2    Number of Visits 17    Date for PT Re-Evaluation 03/19/21    Authorization Type UHC MCR    Authorization Time Period 01/15/21-03/19/21    PT Start Time 0730   running late for appt today   PT Stop Time 0800    PT Time Calculation (min) 30 min    Equipment Utilized During Treatment Gait belt    Activity Tolerance Patient tolerated treatment well;No increased pain    Behavior During Therapy WFL for tasks assessed/performed             Past Medical History:  Diagnosis Date   Allergic rhinitis    Arthritis    Carpal tunnel syndrome    Deep venous thrombosis (HCC)    bilateral legs   Degenerative arthritis    right knee   Hypertension    Lumbar degenerative disc disease    Lump or mass in breast    Paresthesia    Syncope 05/24/2013   Vitamin D deficiency     Past Surgical History:  Procedure Laterality Date   ABDOMINAL HYSTERECTOMY     CARPAL TUNNEL RELEASE Right    CATARACT EXTRACTION Bilateral    HEMORROIDECTOMY     IVC Filter     TOTAL HIP ARTHROPLASTY Right    ULNAR NERVE TRANSPOSITION Right     Vitals:   01/19/21 0734  BP: 138/76     Subjective Assessment - 01/19/21 0734     Subjective Pt with reports of back pain from sleeping on her back last night. Daughter reporting increased difficulty with getting her out of bed "she didn't move her legs to help". Running late for appt due to this. Working with front office to move appts to when they will have help from [facility] caregiver to help them get her ready and to therapy. The caregiver starts at [place]. Pt reports  no falls.    Patient is accompained by: Family member   daughter   Pertinent History 87 y.o.  female presents to ED 9/20 after having 2 falls at home. c/o bilateral hand numbness and weakness and has progressed to L LE weakness. Found to have pulse 55-67, blood pressures elevated up to 207/70, and all other vital signs maintained as well as decreased L sided sensation and strength, and L sided dysmetria CT scan of the head and cervical spine noted chronic atrophy and ischemic changes with no acute abnormality noted and multilevel degenerative changes of the cervical spine. CT angiogram of the head and neck note severe high-grade stenosis of the distal right M1 segment/right MCA bifurcation. Labs significant for INR 1.3, and all other labs relatively within normal limits. Admitted for treatment of  simultaneous likely cervical spine compression as of 9/19 (with symptoms relatively stable), and a superimposed right MCA stroke  PMH: hypertension, DVT of bilateral legs, and arthritis    How long can you sit comfortably? >30 min    How long can you stand comfortably? <5 min    How long can you walk comfortably? <10 min    Patient Stated Goals To return to household ambulation  with cane    Currently in Pain? Yes    Pain Score 8     Pain Location Back    Pain Orientation Right;Lower    Pain Descriptors / Indicators Aching;Sore    Pain Type Acute pain    Pain Onset In the past 7 days    Pain Frequency Constant    Aggravating Factors  poor sleeping posture last night    Pain Relieving Factors rest, Tylenol, Tramadol                    [facility] Adult PT Treatment/Exercise - 01/19/21 0738       Transfers   Transfers Sit to Stand;Stand to Sit;Stand Pivot Transfers    Sit to Stand 2: Max assist;4: Min assist    Sit to Stand Details (indicate cue type and reason) max from wheelchair to RW, min guard to min assist from mat table to RW    Stand to Sit 2: Max assist;4: Min assist    Stand to Sit  Details max assist with sitting after stand pivot transfer, then min assist for controlled descent with remainder of sitting down.    Stand Pivot Transfers 2: Max assist    Stand Pivot Transfer Details (indicate cue type and reason) with RW from wheelchair to mat table with max assist, pt not advancing left LE without assistance and unable to grip walker handle with left hand. with RW mat table to wheelchair used hand orthotic with improved grip noted and with cues/time pt able to self advance left LE toward wheelchair.    Comments pt unable to grip walker with left hand wiht stand pivot transfer. placed hand orthotic on with improved ability to use left hand with RW.      Ambulation/Gait   Ambulation/Gait --    Assistive device --      Therapeutic Activites    Therapeutic Activities Other Therapeutic Activities    Other Therapeutic Activities standing with RW with left hand orthotic: working on stepping in place, progressing to forward/backward stepping. decreased step length/height noted with left LE, however pt able to self move left LE .      Exercises   Exercises Other Exercises    Other Exercises  issued ex's to HEP for LE strengthening. Refer to [facility] program for full details. cues for ex form/technique needed.            issued to HEP: Access Code: [email or web address] URL: [email or web address] Date: 01/19/2021 Prepared by: Willow Ora  Exercises Seated Heel Toe Raises - 1 x daily - 5 x weekly - 1 sets - 10 reps Seated Long Arc Quad - 1 x daily - 5 x weekly - 1 sets - 10 reps         PT Education - 01/19/21 1048     Education Details intial HEP for LE strengthening    Person(s) Educated Patient;Child(ren)   daughter   Methods Explanation;Demonstration;Verbal cues;Handout    Comprehension Verbalized understanding;Returned demonstration;Verbal cues required;Need further instruction              PT Short Term Goals - 01/15/21 1036       PT SHORT TERM  GOAL #1   Title Patient and CGs to demo HEP back to PT w/o cuing    Baseline TBD    Time 4    Period Weeks    Status New    Target Date 02/19/21      PT SHORT TERM GOAL #2  Title patient will transfer STS and stand pivot with SBA    Baseline MinA/CGA for transfers    Time 4    Period Weeks    Status New    Target Date 02/19/21      PT SHORT TERM GOAL #3   Title Patient to ambulate 162ft with RW and CGA    Baseline 67ft with RW and MinA/CGA across level ground    Time 4    Period Weeks    Status New    Target Date 02/19/21      PT SHORT TERM GOAL #4   Title Assess bed mobility and set goal as appropriate    Baseline TBD    Time 4    Period Weeks    Status New    Target Date 02/19/21               PT Long Term Goals - 01/15/21 1040       PT LONG TERM GOAL #1   Title Patient to ambulate 227ft with LRAD and SBA    Baseline 37ft with RW and CGA/MinA    Time 8    Period Weeks    Status New    Target Date 03/19/21      PT LONG TERM GOAL #2   Title patient to present with 4/5 LLE strength in DF, ABD, hip and knee EXT    Baseline 3+/5 strength throughout    Time 8    Period Weeks    Status New    Target Date 03/19/21      PT LONG TERM GOAL #3   Title Patient to perform all transfers under S    Baseline STS transfers from [facility] with CGA/minA    Time 8    Period Weeks    Status New    Target Date 03/19/21      PT LONG TERM GOAL #4   Title Patient and CGs to demo appropriate home program to maintain mobility and transfer skills    Baseline Currently requires assist for transfers and mobility, HEP TBD    Time 8    Period Weeks    Status New    Target Date 03/19/21                   Plan - 01/19/21 0737     Clinical Impression Statement Today's skilled session continued to focus on transfer training, standing balance and strengthening. Issued initial HEP this session for strengthening. Limited ex's issued this session due to time contraints, will  plan to add to program at next session. The pt initially needed increased assistance with mobility compared to last session, however this improved as session continued with less assistance needed. The pt is making steady progress toward goals and should benefit from continued PT to progress toward unmet goals.    Personal Factors and Comorbidities Age;Comorbidity 1;Transportation    Comorbidities CVA    Examination-Activity Limitations Locomotion Level;Transfers;Stand    Stability/Clinical Decision Making Stable/Uncomplicated    Rehab Potential Good    PT Frequency 2x / week    PT Duration 8 weeks    PT Treatment/Interventions ADLs/Self Care Home Management;DME Instruction;Gait training;Stair training;Functional mobility training;Therapeutic activities;Therapeutic exercise;Balance training;Neuromuscular re-education;Patient/family education    PT Next Visit Plan Assess TUG, transfer training, added to HEP for strengthening/balance    PT Home Exercise Plan Access Code: [email or web address]    Consulted and Agree with Plan of Care Patient;Family member/caregiver  Patient will benefit from skilled therapeutic intervention in order to improve the following deficits and impairments:  Abnormal gait, Decreased coordination, Difficulty walking, Decreased endurance, Decreased safety awareness, Decreased activity tolerance, Decreased balance, Decreased mobility, Decreased strength, Impaired sensation  Visit Diagnosis: Unsteadiness on feet  Muscle weakness (generalized)  Other abnormalities of gait and mobility     Problem List Patient Active Problem List   Diagnosis Date Noted   CVA (cerebral vascular accident) ([place]) 01/06/2021   Essential hypertension 01/06/2021   Arthritis 01/06/2021   Pain due to onychomycosis of toenails of both feet 12/14/2018   Presbycusis of both ears 09/22/2015   Syncope 05/24/2013    Willow Ora, PTA 01/19/2021, 10:52 AM  [facility] [address] [place], [place], [address] Phone: [phone]   Fax:  [phone]  Name: [name] MRN: [id number] Date of Birth: Feb 24, 1929

## 2021-01-19 NOTE — Patient Instructions (Signed)
Access Code: [email or web address] URL: [email or web address] Date: 01/19/2021 Prepared by: Willow Ora  Exercises Seated Heel Toe Raises - 1 x daily - 5 x weekly - 1 sets - 10 reps Seated Long Arc Quad - 1 x daily - 5 x weekly - 1 sets - 10 reps

## 2021-01-19 NOTE — Therapy (Signed)
[facility] [address] [place], [place], [address] Phone: [phone]   Fax:  [phone]  Occupational Therapy Treatment  Patient Details  Name: [name] MRN: [id number] Date of Birth: 02/21/1929 Referring Provider (OT): Niel Hummer, MD   Encounter Date: 01/19/2021   OT End of Session - 01/19/21 1012     Visit Number 2    Number of Visits 17    Date for OT Re-Evaluation 03/26/21   16 visits over 10 weeks   Authorization Type UHC Medicare / Medicaid    Authorization Time Period Follow Medicare Guidelines    Progress Note Due on Visit 10    OT Start Time 0805    OT Stop Time 0850    OT Time Calculation (min) 45 min    Activity Tolerance Patient tolerated treatment well    Behavior During Therapy [facility] for tasks assessed/performed             Past Medical History:  Diagnosis Date   Allergic rhinitis    Arthritis    Carpal tunnel syndrome    Deep venous thrombosis (HCC)    bilateral legs   Degenerative arthritis    right knee   Hypertension    Lumbar degenerative disc disease    Lump or mass in breast    Paresthesia    Syncope 05/24/2013   Vitamin D deficiency     Past Surgical History:  Procedure Laterality Date   ABDOMINAL HYSTERECTOMY     CARPAL TUNNEL RELEASE Right    CATARACT EXTRACTION Bilateral    HEMORROIDECTOMY     IVC Filter     TOTAL HIP ARTHROPLASTY Right    ULNAR NERVE TRANSPOSITION Right     There were no vitals filed for this visit.   Subjective Assessment - 01/19/21 0811     Subjective  Daughter reports changes since last Saturday w/ increased pain Lt side. BP WFL's today    Patient is accompanied by: Family member   daughter   Pertinent History s/p 2 falls at home and admitted on 01/06/21. Pt admitted with c/o bilateral hand numbness and weakness and progressed to L LE weakness. Pt currently reporting numbness in BUE and can't lift and hold things . PMH significant  for hypertension, DVT of bilateral legs, and arthritis. Pt lives with daughter and was independent prior to CVA and driving. Pt's daughter is now assisting with self-care and home management. Pt reports goal "to ge back to where I was - be able to do for myself"    Limitations fall risk, Lt inattention    Patient Stated Goals "to ge back to where I was - be able to do for myself"    Currently in Pain? Yes    Pain Score 8     Pain Location Hand    Pain Orientation Left    Pain Descriptors / Indicators Aching;Sore    Pain Type Acute pain    Pain Onset In the past 7 days    Pain Frequency Constant    Aggravating Factors  poor sleeping, posture last night    Pain Relieving Factors rest, tylenol, tramadol              See below for all education done today.  Pt practiced flipping large cards over w/ max simple cueing to use Rt hand to support Lt wrist, and to look at Lt hand. Pt often needed assist from therapist to use Lt hand as pt would  stop using Rt to assist. Pt practiced picking up blocks Lt hand with same level of cueing/assist. Pt's daughter educated in doing these at home (can substitute blocks with plastic bottle tops, checkers, or legos).   Pt's daughter very anxious and asked multiple questions t/o session making it difficult to focus on pt's needs/attend to pt. Pt's daughter expressed concern about pt's decline in status since last Saturday and her decreased ability to get her out of bed or transfer patient. Daughter also perseverates on needs including hospital bed.                     OT Education - 01/19/21 1009     Education Details bed positioning handout, pt/dtr instructed in 2 ex's for Lt hand (flipping large cards over, picking up blocks or checkers or legos to place in bowl both w/ assist needed from Rt hand), looking at hand when using it d/t decreased attention and sensation LT side, discussed/recommended potential home health right now until pt more  mobile for daughter to get in/out bed and car    Person(s) Educated Patient;Child(ren)    Methods Explanation;Demonstration;Verbal cues;Tactile cues   handout only provided for bed positioning   Comprehension Verbalized understanding;Returned demonstration;Tactile cues required;Need further instruction;Verbal cues required              OT Short Term Goals - 01/15/21 1227       OT SHORT TERM GOAL #1   Title Pt will be independent with HEP for coordination    Time 4    Period Weeks    Status New    Target Date 02/12/21      OT SHORT TERM GOAL #2   Title Pt will perform bathing with min A    Baseline max A    Time 10    Period Weeks    Status New      OT SHORT TERM GOAL #3   Title Pt will perform UB dressing with min A    Baseline max A    Time 4    Period Weeks    Status New      OT SHORT TERM GOAL #4   Title Pt will increase coordination in LUE to placing 9 pegs in 2 minutes with LUE in 9-hole peg test    Baseline 1 peg in  2 minutes with LUE    Time 4    Period Weeks    Status New      OT SHORT TERM GOAL #5   Title Pt will perform simple cold meal prep, snack prep and/or light home management with supervision.    Baseline not completing    Time 4    Period Weeks    Status New               OT Long Term Goals - 01/15/21 1230       OT LONG TERM GOAL #1   Title Pt will be independent with any updated HEP    Time 10    Period Weeks    Status New    Target Date 03/26/21      OT LONG TERM GOAL #2   Title Pt will complete 9 hole peg test with LUE in 2 minutes or less for increase in fine motor coordination with LUE    Baseline 1 peg in 2 minutes LUE    Time 10    Period Weeks    Status New  OT LONG TERM GOAL #3   Title Pt will complete basic ADLs with set up/supervision.    Baseline max A    Time 10    Period Weeks    Status New      OT LONG TERM GOAL #4   Title Pt will verbalize understanding of strategies for left inattention and/or  visual field cut    Time 10    Period Weeks    Status New      OT LONG TERM GOAL #5   Title Pt will verbalize sensory strategies for increased safety with ADLs and IADLs.    Time 10    Period Weeks    Status New                   Plan - 01/19/21 1013     Clinical Impression Statement Pt with decline in status since Saturday (this past weekend) w/ decreased mobility and increased pain and numbness Lt side per daughter report. BP stable and WFL's today when taken by P.T.A. prior to O.T. session. Pt's daughter with significant anxiety over pt's condition, inability to efficiently transfer patient, and inability to contact pt's PCP. Pt's daughter asked if home health might be more appropriate, but then she needed further explanation as to what home health is and why it might be appropriate right now. Pt needs significant cueing to attend to Lt side, and even to use Rt hand to assist Lt hand w/ hand under hand assist for simple tasks    OT Occupational Profile and History Problem Focused Assessment - Including review of records relating to presenting problem    Occupational performance deficits (Please refer to evaluation for details): IADL's;ADL's;Leisure    Body Structure / Function / Physical Skills ADL;UE functional use;Pain;Strength;IADL;ROM;FMC;Vision;Sensation;Coordination;Dexterity;GMC;Decreased knowledge of use of DME    Rehab Potential Good    Clinical Decision Making Limited treatment options, no task modification necessary    Comorbidities Affecting Occupational Performance: None    Modification or Assistance to Complete Evaluation  No modification of tasks or assist necessary to complete eval    OT Frequency 2x / week    OT Duration Other (comment)   16 visits over 10 weeks   OT Treatment/Interventions Self-care/ADL training;Fluidtherapy;DME and/or AE instruction;Therapeutic activities;Cognitive remediation/compensation;Visual/perceptual remediation/compensation;Passive  range of motion;Patient/family education;Manual Therapy;Energy conservation;Neuromuscular education;Paraffin;Moist Heat;Functional Mobility Training;Therapeutic exercise    Plan coordination exercises (issue handout), continue to address and assess left inattention/field, ADL reeducation and transfers to decrease caregiver burden of care    Consulted and Agree with Plan of Care Family member/caregiver;Patient    Family Member Consulted daughter             Patient will benefit from skilled therapeutic intervention in order to improve the following deficits and impairments:   Body Structure / Function / Physical Skills: ADL, UE functional use, Pain, Strength, IADL, ROM, FMC, Vision, Sensation, Coordination, Dexterity, GMC, Decreased knowledge of use of DME       Visit Diagnosis: Unsteadiness on feet  Other disturbances of skin sensation  Muscle weakness (generalized)  Other lack of coordination  Visuospatial deficit    Problem List Patient Active Problem List   Diagnosis Date Noted   CVA (cerebral vascular accident) ([place]) 01/06/2021   Essential hypertension 01/06/2021   Arthritis 01/06/2021   Pain due to onychomycosis of toenails of both feet 12/14/2018   Presbycusis of both ears 09/22/2015   Syncope 05/24/2013    Carey Bullocks, OTR/L 01/19/2021, 11:13 AM  [facility] [address] [place], [place], [address] Phone: [phone]   Fax:  [phone]  Name: ALEXXIS MACKERT MRN: [id number] Date of Birth: [birth year]

## 2021-01-20 ENCOUNTER — Ambulatory Visit: Payer: Medicare Other | Admitting: Occupational Therapy

## 2021-01-20 DIAGNOSIS — M6281 Muscle weakness (generalized): Secondary | ICD-10-CM

## 2021-01-20 DIAGNOSIS — R208 Other disturbances of skin sensation: Secondary | ICD-10-CM

## 2021-01-20 DIAGNOSIS — R278 Other lack of coordination: Secondary | ICD-10-CM

## 2021-01-20 DIAGNOSIS — R41842 Visuospatial deficit: Secondary | ICD-10-CM

## 2021-01-20 DIAGNOSIS — R41840 Attention and concentration deficit: Secondary | ICD-10-CM

## 2021-01-20 DIAGNOSIS — R2681 Unsteadiness on feet: Secondary | ICD-10-CM | POA: Diagnosis not present

## 2021-01-20 NOTE — Patient Instructions (Addendum)
  Flexion (Assistive)    Place Rt hand under Lt wrist and raise arms to eye level, keeping elbows as straight as possible. Can be done sitting or lying. Repeat __10__ times. Do __3__ sessions per day.  Flexion (Passive)    Sitting upright, slide both arms forward along table (Rt hand over Lt wrist), bending from the waist until a stretch is felt. Slide forward, then back. Repeat __10__ times. Do __3__ sessions per day.  Functional things to do with Lt hand - pt can place Rt hand under Lt wrist to assist:  - Flip large cards over - Practice picking up blocks (can use plastic bottle caps, large lego pieces, checkers) and placing in shallow bowl - Slide checkers along table  - Try to pick up empty plastic cup and bring to mouth and place back on table  - Try holding loofah Lt hand and bathe top of both thighs and chest w/ Lt hand  SAFETY CONSIDERATIONS DUE TO LACK OF ATTENTION AND SENSATION ON LEFT SIDE:   1) ALWAYS look at Lt hand/arm when using it 2) ALWAYS check temperature of water for bathing/washing hands with Rt hand 3) Place things on Lt side and sit on her Lt side to encourage her to look left and attend to Lt side

## 2021-01-20 NOTE — Therapy (Signed)
[facility] [address] [place], [place], [address] Phone: [phone]   Fax:  [phone]  Occupational Therapy Treatment  Patient Details  Name: [name] MRN: [id number] Date of Birth: Apr 07, 1929 Referring Provider (OT): Niel Hummer, MD   Encounter Date: 01/20/2021   OT End of Session - 01/20/21 1024     Visit Number 3    Number of Visits 17    Date for OT Re-Evaluation 03/26/21   16 visits over 10 weeks   Authorization Type [employer] Medicare / Medicaid    Authorization Time Period Follow Medicare Guidelines    Progress Note Due on Visit 10    OT Start Time 0932    OT Stop Time 1017    OT Time Calculation (min) 45 min    Activity Tolerance Patient tolerated treatment well    Behavior During Therapy [facility] for tasks assessed/performed             Past Medical History:  Diagnosis Date   Allergic rhinitis    Arthritis    Carpal tunnel syndrome    Deep venous thrombosis (HCC)    bilateral legs   Degenerative arthritis    right knee   Hypertension    Lumbar degenerative disc disease    Lump or mass in breast    Paresthesia    Syncope 05/24/2013   Vitamin D deficiency     Past Surgical History:  Procedure Laterality Date   ABDOMINAL HYSTERECTOMY     CARPAL TUNNEL RELEASE Right    CATARACT EXTRACTION Bilateral    HEMORROIDECTOMY     IVC Filter     TOTAL HIP ARTHROPLASTY Right    ULNAR NERVE TRANSPOSITION Right     There were no vitals filed for this visit.   Subjective Assessment - 01/20/21 0936     Subjective  Daughter reports changes since last Saturday w/ increased pain, however better since yesterday's session. Pt's daughter reports she slept better last night    Patient is accompanied by: Family member   daughter   Pertinent History s/p 2 falls at home and admitted on 01/06/21. Pt admitted with c/o bilateral hand numbness and weakness and progressed to L LE weakness. Pt currently  reporting numbness in BUE and can't lift and hold things . PMH significant for hypertension, DVT of bilateral legs, and arthritis. Pt lives with daughter and was independent prior to CVA and driving. Pt's daughter is now assisting with self-care and home management. Pt reports goal "to ge back to where I was - be able to do for myself"    Limitations fall risk, Lt inattention    Patient Stated Goals "to ge back to where I was - be able to do for myself"    Currently in Pain? Yes    Pain Score 8     Pain Location --   entire Lt side   Pain Orientation Left    Pain Descriptors / Indicators Aching;Sore    Pain Type Acute pain    Pain Onset In the past 7 days    Pain Frequency Constant    Pain Relieving Factors rest, tylenol, tramadol             Pt still ranks pain 8/10 but appears to have increased participation today in therapy.   HEP issued for neuro re-educ and functional use LUE/hand as well as to increase attention to Lt side. See pt instructions for details.  Emphasized using Lt hand when  able and safe w/ assist from Rt hand supporting under Lt wrist when needed.  Also encouraged pt's daughter and aide to sit on pt's Lt side when able and cue pt to look at [facility] hand when using it.  Pt with Lt inattention, decreased sensation Lt side but also ? Motor planning deficits.                      OT Education - 01/20/21 1023     Education Details Formal HEP for LUE shoulder motion and functional use of LUE/hand for neuro re-education    Person(s) Educated Patient;Child(ren)    Methods Explanation;Demonstration;Verbal cues;Tactile cues    Comprehension Verbalized understanding;Returned demonstration;Tactile cues required;Verbal cues required   daughter with better understanding today             OT Short Term Goals - 01/20/21 1025       OT SHORT TERM GOAL #1   Title Pt will be independent with HEP for coordination    Time 4    Period Weeks    Status On-going     Target Date 02/12/21      OT SHORT TERM GOAL #2   Title Pt will perform bathing with min A    Baseline max A    Time 10    Period Weeks    Status On-going      OT SHORT TERM GOAL #3   Title Pt will perform UB dressing with min A    Baseline max A    Time 4    Period Weeks    Status New      OT SHORT TERM GOAL #4   Title Pt will increase coordination in LUE to placing 9 pegs in 2 minutes with LUE in 9-hole peg test    Baseline 1 peg in  2 minutes with LUE    Time 4    Period Weeks    Status New      OT SHORT TERM GOAL #5   Title Pt will perform simple cold meal prep, snack prep and/or light home management with supervision.    Baseline not completing    Time 4    Period Weeks    Status New               OT Long Term Goals - 01/15/21 1230       OT LONG TERM GOAL #1   Title Pt will be independent with any updated HEP    Time 10    Period Weeks    Status New    Target Date 03/26/21      OT LONG TERM GOAL #2   Title Pt will complete 9 hole peg test with LUE in 2 minutes or less for increase in fine motor coordination with LUE    Baseline 1 peg in 2 minutes LUE    Time 10    Period Weeks    Status New      OT LONG TERM GOAL #3   Title Pt will complete basic ADLs with set up/supervision.    Baseline max A    Time 10    Period Weeks    Status New      OT LONG TERM GOAL #4   Title Pt will verbalize understanding of strategies for left inattention and/or visual field cut    Time 10    Period Weeks    Status New      OT LONG  TERM GOAL #5   Title Pt will verbalize sensory strategies for increased safety with ADLs and IADLs.    Time 10    Period Weeks    Status New                   Plan - 01/20/21 1025     Clinical Impression Statement Pt/daughter reports better night's sleep last night. Pt's daughter with greater understanding of care for patient in re: LUE and attending to Lt side.    OT Occupational Profile and History Problem Focused  Assessment - Including review of records relating to presenting problem    Occupational performance deficits (Please refer to evaluation for details): IADL's;ADL's;Leisure    Body Structure / Function / Physical Skills ADL;UE functional use;Pain;Strength;IADL;ROM;FMC;Vision;Sensation;Coordination;Dexterity;GMC;Decreased knowledge of use of DME    Rehab Potential Good    Clinical Decision Making Limited treatment options, no task modification necessary    Comorbidities Affecting Occupational Performance: None    Modification or Assistance to Complete Evaluation  No modification of tasks or assist necessary to complete eval    OT Frequency 2x / week    OT Duration Other (comment)   16 visits over 10 weeks   OT Treatment/Interventions Self-care/ADL training;Fluidtherapy;DME and/or AE instruction;Therapeutic activities;Cognitive remediation/compensation;Visual/perceptual remediation/compensation;Passive range of motion;Patient/family education;Manual Therapy;Energy conservation;Neuromuscular education;Paraffin;Moist Heat;Functional Mobility Training;Therapeutic exercise    Plan continue to address and assess left inattention/field, work on dressing, and transfers to decrease caregiver burden of care    Consulted and Agree with Plan of Care Family member/caregiver;Patient    Family Member Consulted daughter             Patient will benefit from skilled therapeutic intervention in order to improve the following deficits and impairments:   Body Structure / Function / Physical Skills: ADL, UE functional use, Pain, Strength, IADL, ROM, FMC, Vision, Sensation, Coordination, Dexterity, GMC, Decreased knowledge of use of DME       Visit Diagnosis: Attention and concentration deficit  Visuospatial deficit  Other disturbances of skin sensation  Muscle weakness (generalized)  Other lack of coordination    Problem List Patient Active Problem List   Diagnosis Date Noted   CVA (cerebral  vascular accident) ([place]) 01/06/2021   Essential hypertension 01/06/2021   Arthritis 01/06/2021   Pain due to onychomycosis of toenails of both feet 12/14/2018   Presbycusis of both ears 09/22/2015   Syncope 05/24/2013    Carey Bullocks, OTR/L 01/20/2021, 10:27 AM  [facility] [address] [place], [place], [address] Phone: [phone]   Fax:  [phone]  Name: [name] MRN: [id number] Date of Birth: 1928/09/04

## 2021-01-22 ENCOUNTER — Ambulatory Visit: Payer: Medicare Other

## 2021-01-22 ENCOUNTER — Other Ambulatory Visit: Payer: Self-pay | Admitting: *Deleted

## 2021-01-22 NOTE — Patient Outreach (Signed)
[facility]) Care Management  01/22/2021  KARSON CHICAS 1929-01-16 [id number]   Incoming call received from daughter [name] expressing frustration about EMMI stroke calls.  Member was to be taken off call list however they still received another call today.  State member answered that she was feeling worse, still having pain from returning function/feeling in left side.  This has already been addressed with provider, taking Tramadol and Voltaren cream.  Member now has hospital bed and personal care aide, which has been a big help to daughter.  Denies any urgent concerns, encouraged to contact this care manager with questions.  Agrees to follow up within the next week.  Valente David, [place], MSN [place] [phone]

## 2021-01-25 ENCOUNTER — Observation Stay (HOSPITAL_COMMUNITY): Payer: Medicare Other

## 2021-01-25 ENCOUNTER — Encounter (HOSPITAL_COMMUNITY): Payer: Self-pay | Admitting: *Deleted

## 2021-01-25 ENCOUNTER — Other Ambulatory Visit: Payer: Self-pay | Admitting: *Deleted

## 2021-01-25 ENCOUNTER — Emergency Department (HOSPITAL_COMMUNITY): Payer: Medicare Other

## 2021-01-25 ENCOUNTER — Inpatient Hospital Stay (HOSPITAL_COMMUNITY)
Admission: EM | Admit: 2021-01-25 | Discharge: 2021-01-28 | DRG: 065 | Disposition: A | Discharge status: Skilled Nursing Facility | Payer: Medicare Other | Attending: Student in an Organized Health Care Education/Training Program | Admitting: Student in an Organized Health Care Education/Training Program

## 2021-01-25 DIAGNOSIS — Z880 Allergy status to penicillin: Secondary | ICD-10-CM

## 2021-01-25 DIAGNOSIS — R29810 Facial weakness: Secondary | ICD-10-CM | POA: Diagnosis present

## 2021-01-25 DIAGNOSIS — F17210 Nicotine dependence, cigarettes, uncomplicated: Secondary | ICD-10-CM | POA: Diagnosis present

## 2021-01-25 DIAGNOSIS — Z7982 Long term (current) use of aspirin: Secondary | ICD-10-CM

## 2021-01-25 DIAGNOSIS — Z90710 Acquired absence of both cervix and uterus: Secondary | ICD-10-CM

## 2021-01-25 DIAGNOSIS — Z7902 Long term (current) use of antithrombotics/antiplatelets: Secondary | ICD-10-CM

## 2021-01-25 DIAGNOSIS — Z86718 Personal history of other venous thrombosis and embolism: Secondary | ICD-10-CM

## 2021-01-25 DIAGNOSIS — R29710 NIHSS score 10: Secondary | ICD-10-CM | POA: Diagnosis present

## 2021-01-25 DIAGNOSIS — E669 Obesity, unspecified: Secondary | ICD-10-CM | POA: Diagnosis present

## 2021-01-25 DIAGNOSIS — I639 Cerebral infarction, unspecified: Secondary | ICD-10-CM

## 2021-01-25 DIAGNOSIS — Z20822 Contact with and (suspected) exposure to covid-19: Secondary | ICD-10-CM | POA: Diagnosis present

## 2021-01-25 DIAGNOSIS — R29709 NIHSS score 9: Secondary | ICD-10-CM | POA: Diagnosis not present

## 2021-01-25 DIAGNOSIS — Z96641 Presence of right artificial hip joint: Secondary | ICD-10-CM | POA: Diagnosis present

## 2021-01-25 DIAGNOSIS — M1990 Unspecified osteoarthritis, unspecified site: Secondary | ICD-10-CM | POA: Diagnosis present

## 2021-01-25 DIAGNOSIS — I10 Essential (primary) hypertension: Secondary | ICD-10-CM | POA: Diagnosis present

## 2021-01-25 DIAGNOSIS — R29708 NIHSS score 8: Secondary | ICD-10-CM | POA: Diagnosis not present

## 2021-01-25 DIAGNOSIS — Z885 Allergy status to narcotic agent status: Secondary | ICD-10-CM

## 2021-01-25 DIAGNOSIS — R29711 NIHSS score 11: Secondary | ICD-10-CM | POA: Diagnosis not present

## 2021-01-25 DIAGNOSIS — E861 Hypovolemia: Secondary | ICD-10-CM | POA: Diagnosis present

## 2021-01-25 DIAGNOSIS — N179 Acute kidney failure, unspecified: Secondary | ICD-10-CM | POA: Diagnosis not present

## 2021-01-25 DIAGNOSIS — I63511 Cerebral infarction due to unspecified occlusion or stenosis of right middle cerebral artery: Secondary | ICD-10-CM | POA: Diagnosis not present

## 2021-01-25 DIAGNOSIS — G8194 Hemiplegia, unspecified affecting left nondominant side: Secondary | ICD-10-CM | POA: Diagnosis not present

## 2021-01-25 DIAGNOSIS — Z79899 Other long term (current) drug therapy: Secondary | ICD-10-CM

## 2021-01-25 DIAGNOSIS — J9811 Atelectasis: Secondary | ICD-10-CM | POA: Diagnosis present

## 2021-01-25 DIAGNOSIS — Z6831 Body mass index (BMI) 31.0-31.9, adult: Secondary | ICD-10-CM

## 2021-01-25 DIAGNOSIS — E860 Dehydration: Secondary | ICD-10-CM | POA: Diagnosis present

## 2021-01-25 LAB — CBC WITH DIFFERENTIAL/PLATELET
Abs Immature Granulocytes: 0.06 10*3/uL (ref 0.00–0.07)
Basophils Absolute: 0 10*3/uL (ref 0.0–0.1)
Basophils Relative: 1 %
Eosinophils Absolute: 0.3 10*3/uL (ref 0.0–0.5)
Eosinophils Relative: 3 %
HCT: 44.9 % (ref 36.0–46.0)
Hemoglobin: 14.3 g/dL (ref 12.0–15.0)
Immature Granulocytes: 1 %
Lymphocytes Relative: 18 %
Lymphs Abs: 1.6 10*3/uL (ref 0.7–4.0)
MCH: 31.4 pg (ref 26.0–34.0)
MCHC: 31.8 g/dL (ref 30.0–36.0)
MCV: 98.7 fL (ref 80.0–100.0)
Monocytes Absolute: 0.7 10*3/uL (ref 0.1–1.0)
Monocytes Relative: 8 %
Neutro Abs: 6.1 10*3/uL (ref 1.7–7.7)
Neutrophils Relative %: 69 %
Platelets: 194 10*3/uL (ref 150–400)
RBC: 4.55 MIL/uL (ref 3.87–5.11)
RDW: 13.3 % (ref 11.5–15.5)
WBC: 8.7 10*3/uL (ref 4.0–10.5)
nRBC: 0 % (ref 0.0–0.2)

## 2021-01-25 LAB — COMPREHENSIVE METABOLIC PANEL
ALT: 17 U/L (ref 0–44)
AST: 33 U/L (ref 15–41)
Albumin: 3.4 g/dL — ABNORMAL LOW (ref 3.5–5.0)
Alkaline Phosphatase: 54 U/L (ref 38–126)
Anion gap: 13 (ref 5–15)
BUN: 59 mg/dL — ABNORMAL HIGH (ref 8–23)
CO2: 20 mmol/L — ABNORMAL LOW (ref 22–32)
Calcium: 9.6 mg/dL (ref 8.9–10.3)
Chloride: 101 mmol/L (ref 98–111)
Creatinine, Ser: 1.73 mg/dL — ABNORMAL HIGH (ref 0.44–1.00)
GFR, Estimated: 28 mL/min — ABNORMAL LOW (ref >60)
Glucose, Bld: 111 mg/dL — ABNORMAL HIGH (ref 70–99)
Potassium: 5 mmol/L (ref 3.5–5.1)
Sodium: 134 mmol/L — ABNORMAL LOW (ref 135–145)
Total Bilirubin: 1.1 mg/dL (ref 0.3–1.2)
Total Protein: 7.1 g/dL (ref 6.5–8.1)

## 2021-01-25 LAB — RESP PANEL BY RT-PCR (FLU A&B, COVID) ARPGX2
Influenza A by PCR: NEGATIVE
Influenza B by PCR: NEGATIVE
SARS Coronavirus 2 by RT PCR: NEGATIVE

## 2021-01-25 LAB — URINALYSIS, ROUTINE W REFLEX MICROSCOPIC
Bilirubin Urine: NEGATIVE
Glucose, UA: NEGATIVE mg/dL
Hgb urine dipstick: NEGATIVE
Ketones, ur: NEGATIVE mg/dL
Leukocytes,Ua: NEGATIVE
Nitrite: NEGATIVE
Protein, ur: NEGATIVE mg/dL
Specific Gravity, Urine: 1.016 (ref 1.005–1.030)
pH: 5 (ref 5.0–8.0)

## 2021-01-25 LAB — RAPID URINE DRUG SCREEN, HOSP PERFORMED
Amphetamines: NOT DETECTED
Barbiturates: NOT DETECTED
Benzodiazepines: NOT DETECTED
Cocaine: NOT DETECTED
Opiates: NOT DETECTED
Tetrahydrocannabinol: NOT DETECTED

## 2021-01-25 LAB — ETHANOL: Alcohol, Ethyl (B): <10 mg/dL (ref <10)

## 2021-01-25 LAB — APTT: aPTT: 29 seconds (ref 24–36)

## 2021-01-25 LAB — PROTIME-INR
INR: 1.1 (ref 0.8–1.2)
Prothrombin Time: 14.6 seconds (ref 11.4–15.2)

## 2021-01-25 IMAGING — CT CT HEAD W/O CM
3 series · 15 of 47 positions shown, 18 images · non-contrast
Comparison: [DATE]

CLINICAL DATA: Left-sided weakness, facial droop

EXAM:
CT HEAD WITHOUT CONTRAST
TECHNIQUE: Contiguous axial images were obtained from the base of the skull
through the vertex without intravenous contrast.

[Series 3: head 5.0 h30s · axial · 0.43mm/px · z∈[-77,+53]mm · 9 of 32 slices shown, 12 images]
[im 3/32  brain]
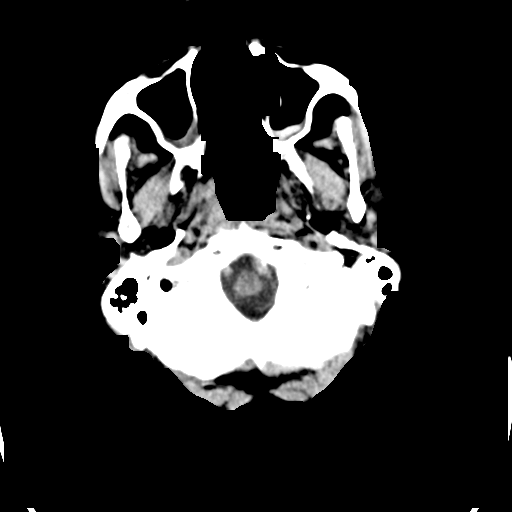
[im 3/32  bone]
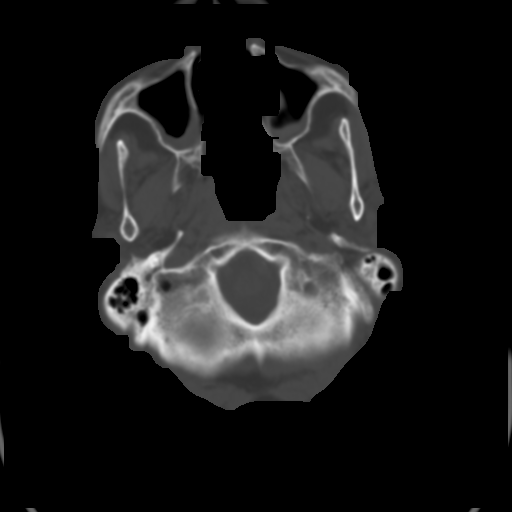
[im 6/32  brain]
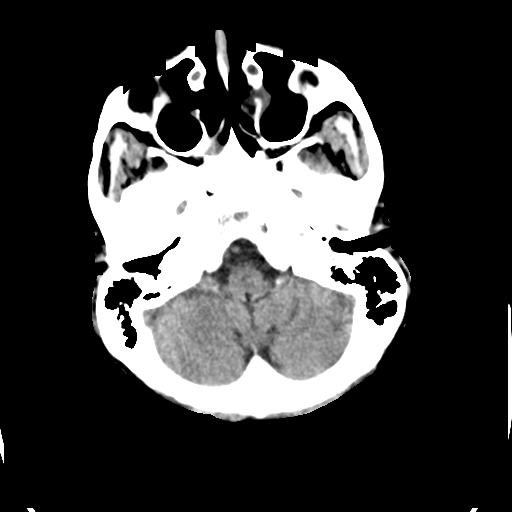
[im 9/32  brain]
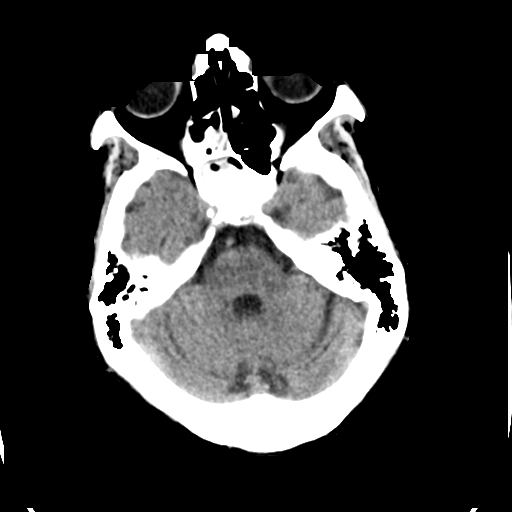
[im 12/32  brain]
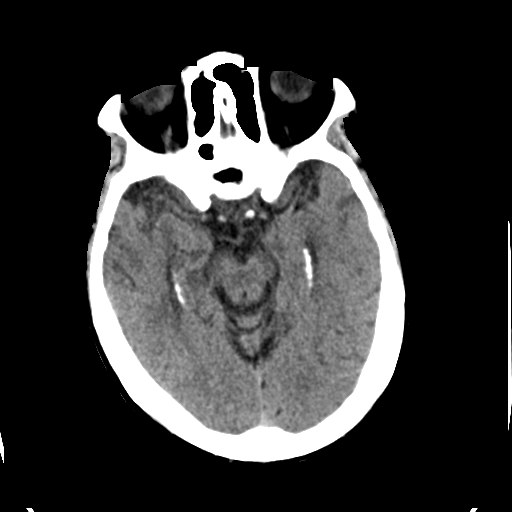
[im 17/32  brain]
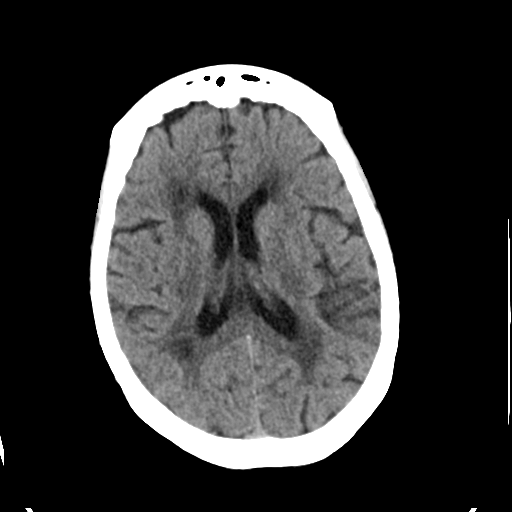
[im 17/32  bone]
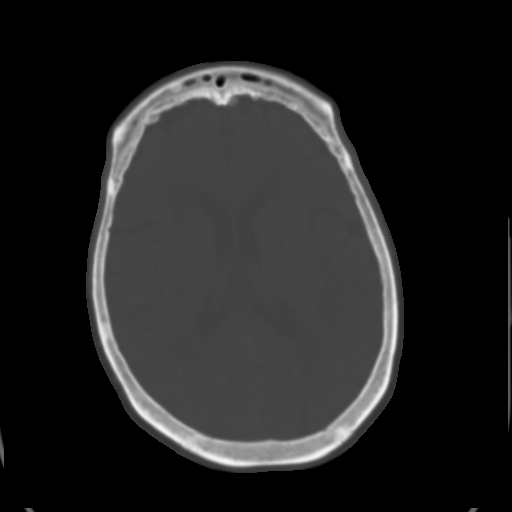
[im 20/32  brain]
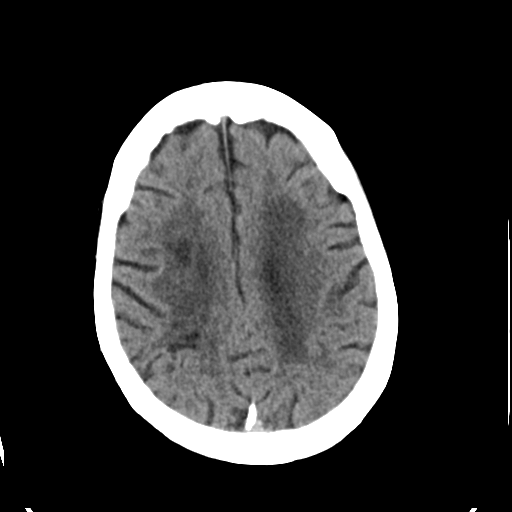
[im 23/32  brain]
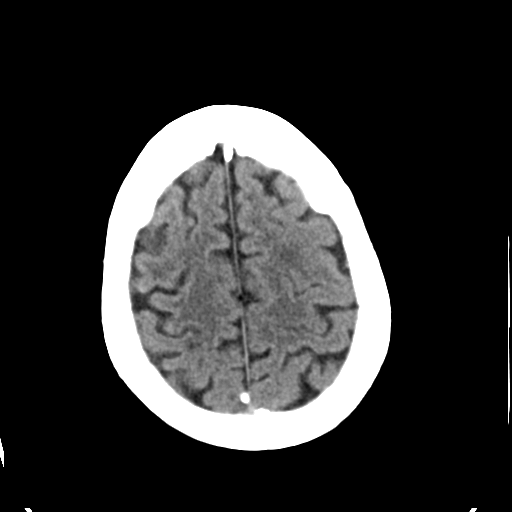
[im 26/32  brain]
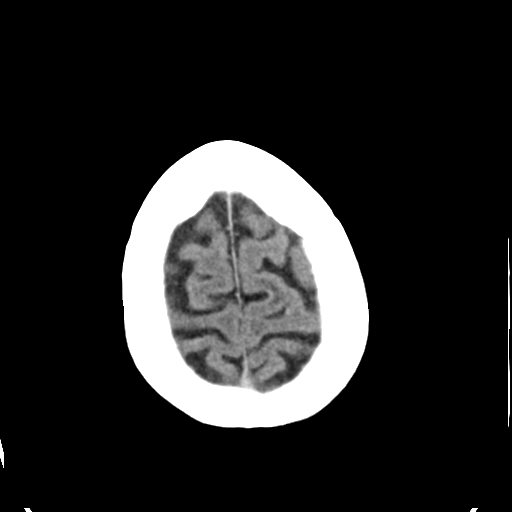
[im 29/32  brain]
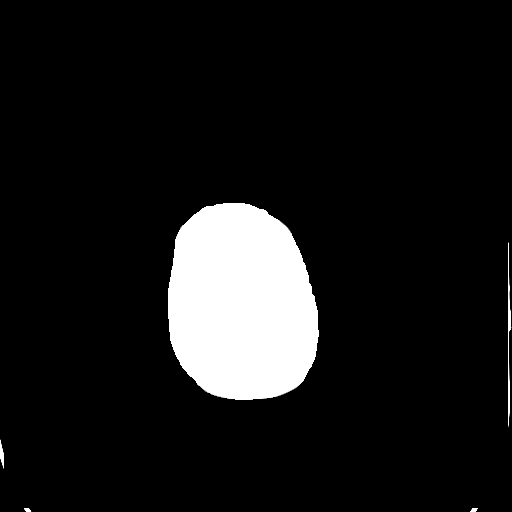
[im 29/32  bone]
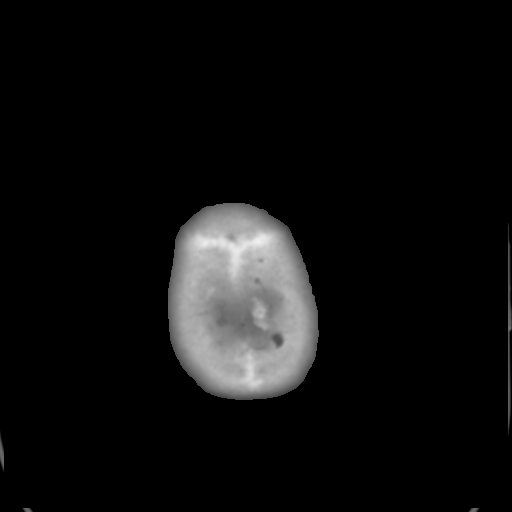

[Series 5: head 3.0 mpr cor · coronal · 0.33mm/px · 3 of 62 slices shown]
[im 21/62  brain]
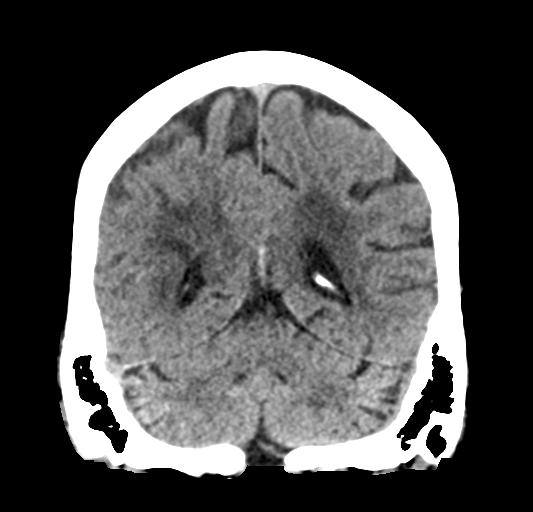
[im 28/62  brain]
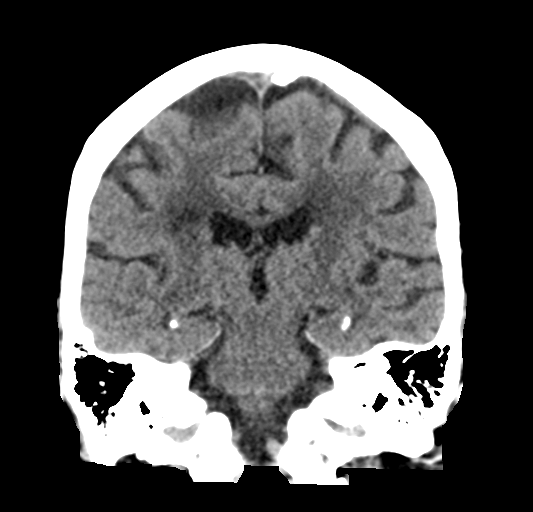
[im 34/62  brain]
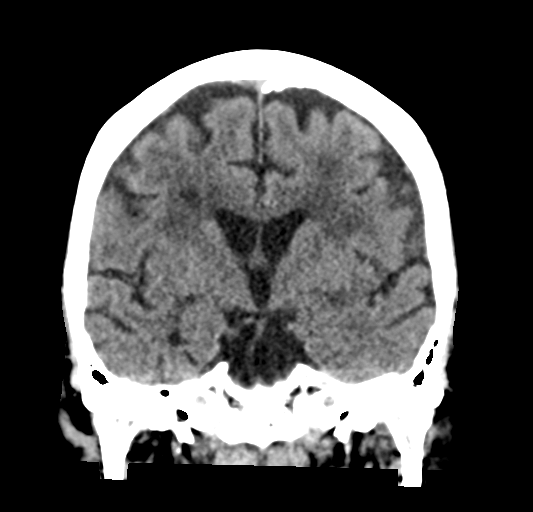

[Series 6: head 3.0 mpr sag · sagittal · 0.31mm/px · 3 of 50 slices shown]
[im 17/50  brain]
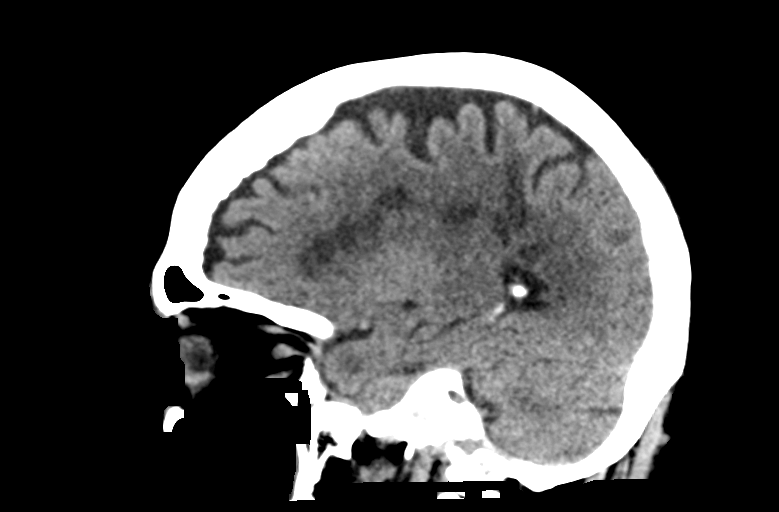
[im 25/50  brain]
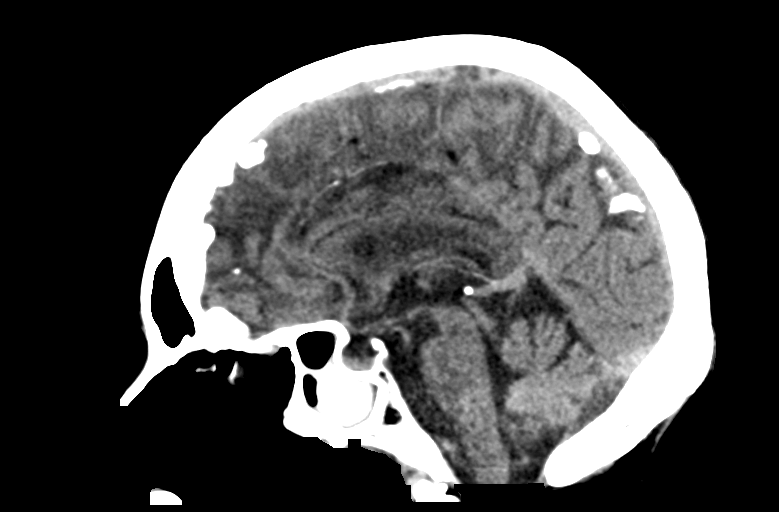
[im 33/50  brain]
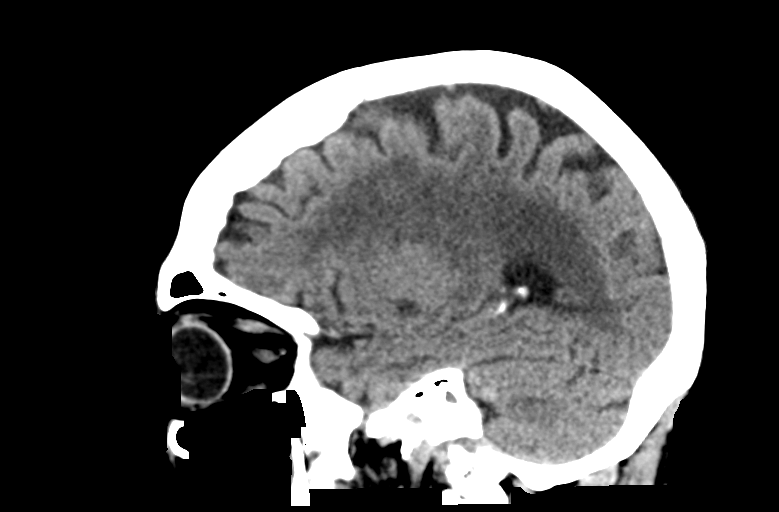

[15 of 47 positions shown; findings below may reference images not displayed]

FINDINGS: Brain: Age related volume loss. Extensive diffuse bilateral
low-density throughout the deep white matter, likely chronic small
vessel disease. No acute intracranial abnormality. Specifically, no
hemorrhage, hydrocephalus, mass lesion, acute infarction, or
significant intracranial injury.

Vascular: No hyperdense vessel or unexpected calcification.

Skull: No acute calvarial abnormality.

Sinuses/Orbits: Mucosal thickening throughout the paranasal sinuses.
No air-fluid levels.

Other: None
IMPRESSION: Extensive chronic small vessel disease.

No acute intracranial abnormality.

## 2021-01-25 IMAGING — MR MR HEAD W/O CM
12 of 13 series · 44 of 48 positions shown · non-contrast
Comparison: Head CT earlier same day.

CLINICAL DATA: Neurological deficit, acute, stroke suspected.
Left-sided weakness.

EXAM:
MRI HEAD WITHOUT CONTRAST
TECHNIQUE: Multiplanar, multiecho pulse sequences of the brain and surrounding
structures were obtained without intravenous contrast.

[Series 5: DWI · axial · 3.0mm · 0.88mm/px · z∈[-71,+70]mm · 8 of 96 slices shown (1 of 4)]
[im 1/96]
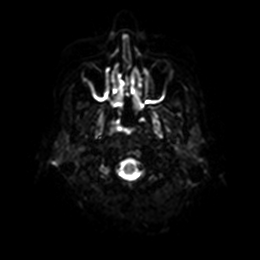
[im 14/96]
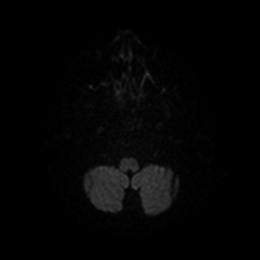
[im 28/96]
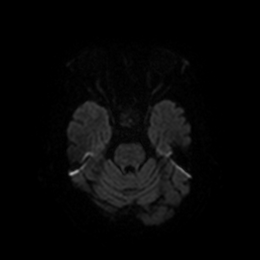
[im 41/96]
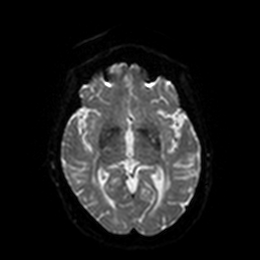
[im 55/96]
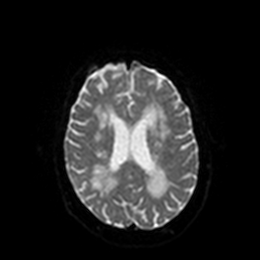
[im 68/96]
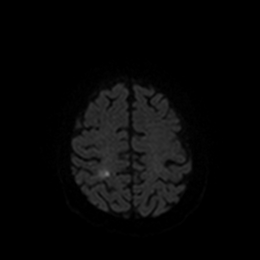
[im 82/96]
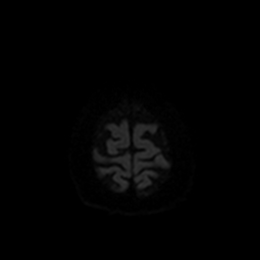
[im 96/96]
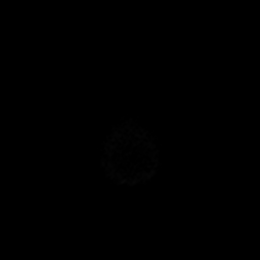

[Series 6: DWI · axial · 3.0mm · 0.88mm/px · z∈[-71,+70]mm · 4 of 48 slices shown (2 of 4)]
[im 1/48]
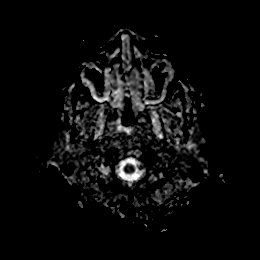
[im 16/48]
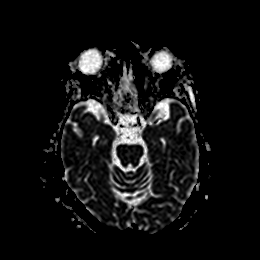
[im 32/48]
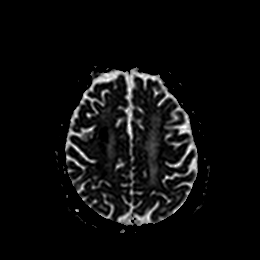
[im 48/48]
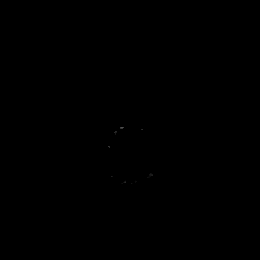

[Series 7: DWI · coronal · 4.0mm · 0.88mm/px · 5 of 64 slices shown (3 of 4)]
[im 1/64]
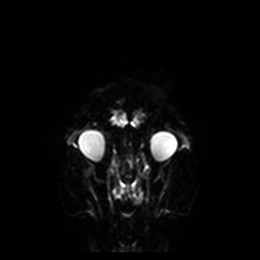
[im 16/64]
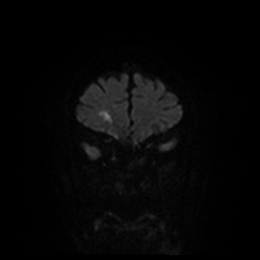
[im 32/64]
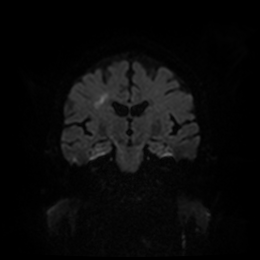
[im 48/64]
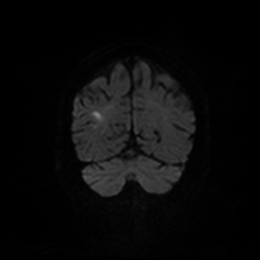
[im 64/64]
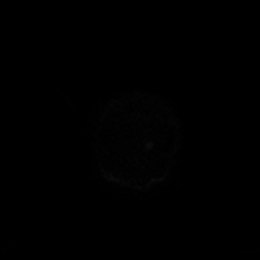

[Series 8: DWI · coronal · 4.0mm · 0.88mm/px · 3 of 32 slices shown (4 of 4)]
[im 1/32]
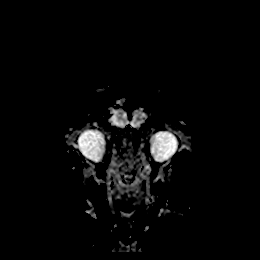
[im 16/32]
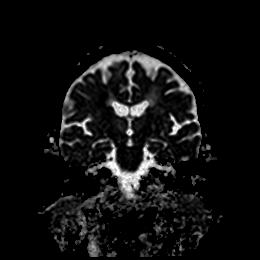
[im 32/32]
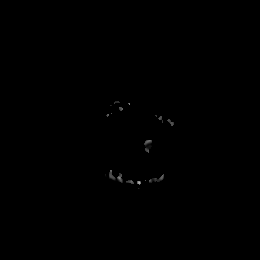

[Series 9: T1 · sagittal · 5.0mm · 0.75mm/px · 2 of 23 slices shown]
[im 1/23]
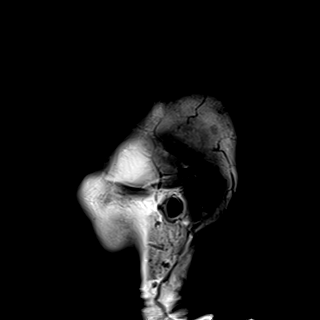
[im 23/23]
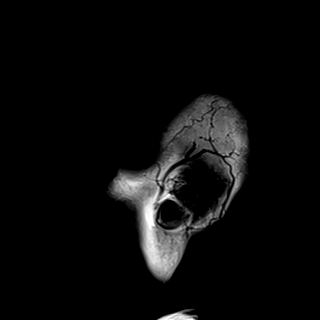

[Series 10: T2 · axial · 5.0mm · 0.72mm/px · z∈[-73,+71]mm · 2 of 25 slices shown (1 of 2)]
[im 1/25]
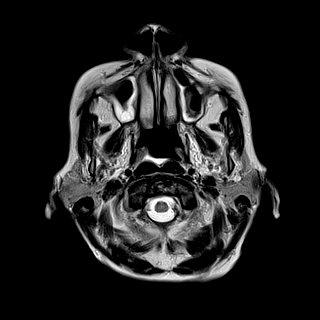
[im 25/25]
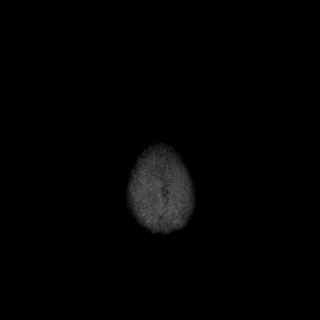

[Series 11: FLAIR · axial · 5.0mm · 0.45mm/px · z∈[-72,+72]mm · 2 of 25 slices shown]
[im 1/25]
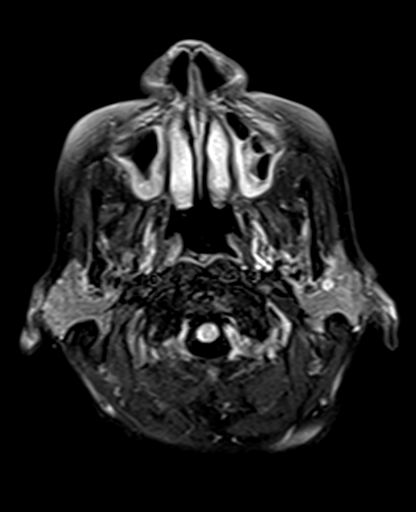
[im 25/25]
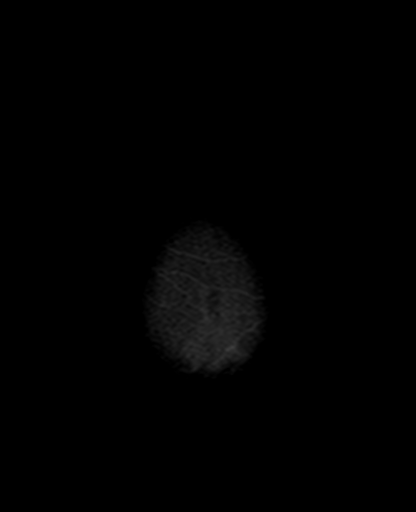

[Series 12: mag_images · axial · 3.0mm · 0.90mm/px · z∈[-77,+76]mm · 4 of 52 slices shown]
[im 1/52]
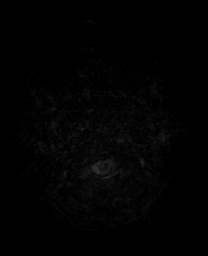
[im 18/52]
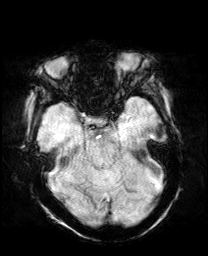
[im 35/52]
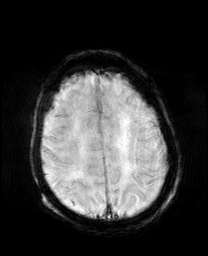
[im 52/52]
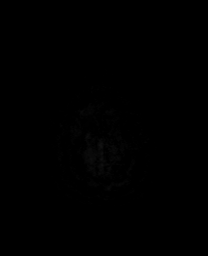

[Series 13: pha_images · axial · 3.0mm · 0.90mm/px · z∈[-77,+76]mm · 4 of 52 slices shown]
[im 1/52]
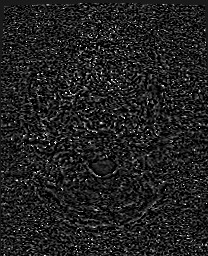
[im 18/52]
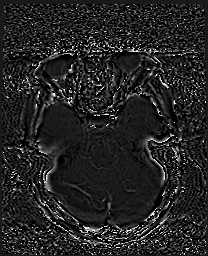
[im 35/52]
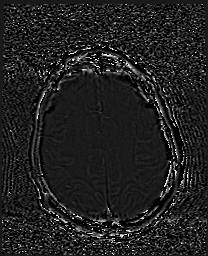
[im 52/52]
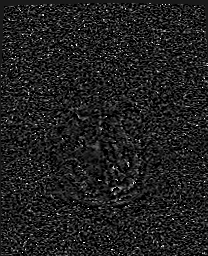

[Series 14: swi_images · axial · 3.0mm · 0.90mm/px · z∈[-77,+76]mm · 4 of 52 slices shown]
[im 1/52]
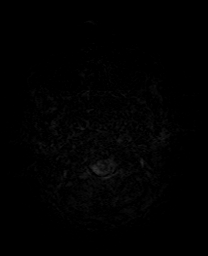
[im 18/52]
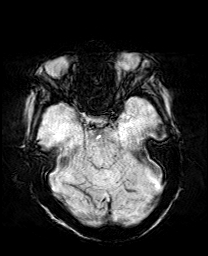
[im 35/52]
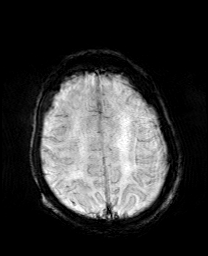
[im 52/52]
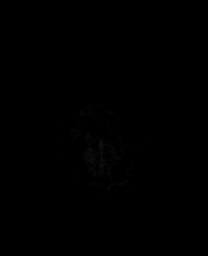

[Series 15: mip_images(sw) · axial · 24.0mm · 0.90mm/px · z∈[-66,+66]mm · 4 of 45 slices shown]
[im 1/45]
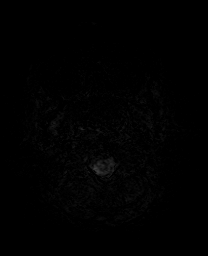
[im 15/45]
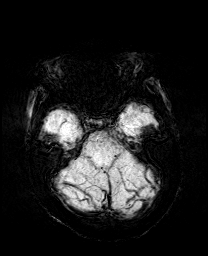
[im 30/45]
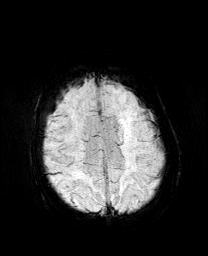
[im 45/45]
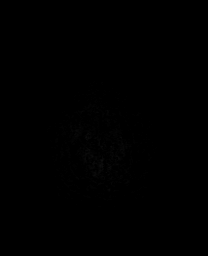

[Series 17: T2 · coronal · 5.0mm · 0.34mm/px · 2 of 29 slices shown (2 of 2)]
[im 1/29]
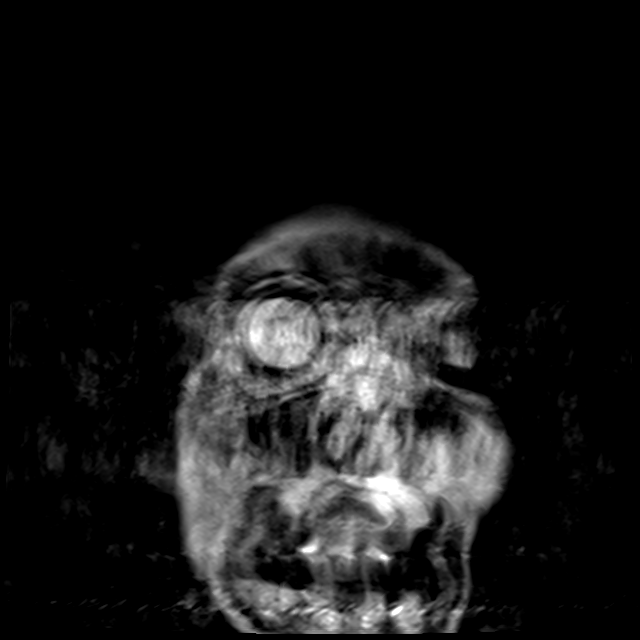
[im 29/29]
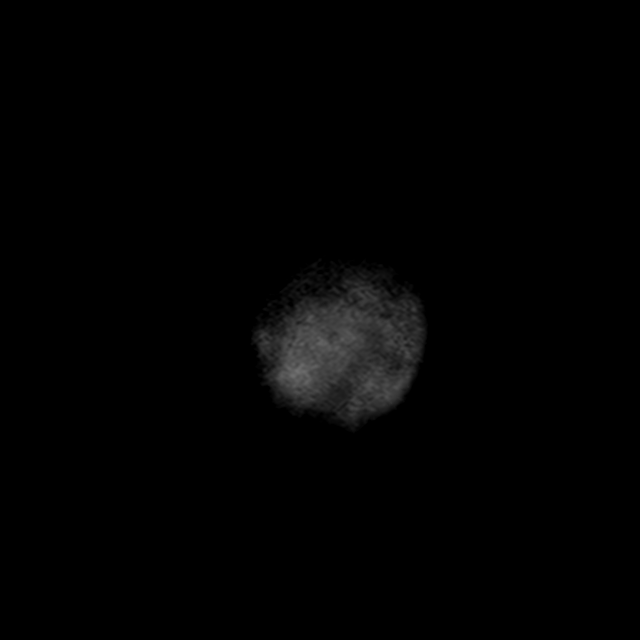

[44 of 48 positions shown; findings below may reference images not displayed]

FINDINGS: Brain: Diffusion imaging shows acute infarction within the right
hemisphere extensively affecting the subcortical white matter of the
frontal and parietal regions, with a focal subcortical white matter
infarction of the deep insula. This pattern suggests watershed
infarction.

Minimal chronic small-vessel ischemic changes affect the pons. No
focal cerebellar finding. Cerebral hemispheres otherwise show
chronic small-vessel ischemic changes of the deep and subcortical
white matter of both hemispheres. There is an old small right
parietal cortical and subcortical infarction. No evidence of mass,
hemorrhage, hydrocephalus or extra-axial collection.

Vascular: Major vessels at the base of the brain show flow.

Skull and upper cervical spine: Negative

Sinuses/Orbits: Mucosal inflammatory changes of the paranasal
sinuses. Orbits negative.

Other: None
IMPRESSION: Acute infarction affecting the deep and subcortical white matter of
the right hemisphere, primarily in the frontal and parietal region
with a punctate focus in the deep insular white matter. This
pattern/distribution suggests watershed infarction, though there
does appear to be flow within the right internal carotid artery.

Background pattern of chronic small vessel ischemic changes of the
cerebral hemispheric white matter. Old small right parietal cortical
infarction.

## 2021-01-25 IMAGING — MR MR MRA HEAD W/O CM
1 series · 20 of 48 positions shown · non-contrast
Comparison: No pertinent prior exam.

CLINICAL DATA: Syncope and left-sided weakness

EXAM:
MRA HEAD WITHOUT CONTRAST
TECHNIQUE: Angiographic images of the Circle of Willis were acquired using MRA
technique without intravenous contrast.

[Series 2: ax (id) · axial · 1.0mm · 0.43mm/px · z∈[-43,+31]mm · 20 of 176 slices shown]
[im 1/176]
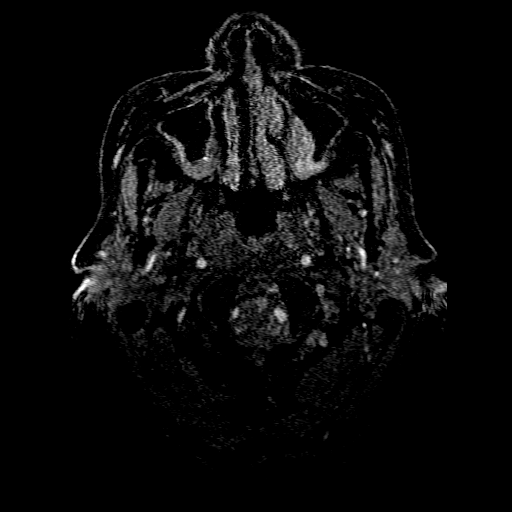
[im 4/176]
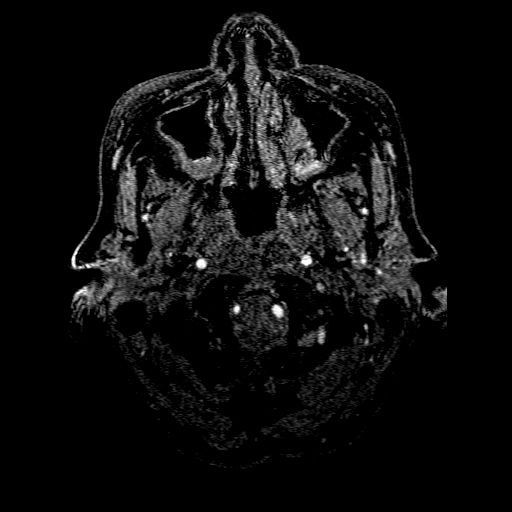
[im 8/176]
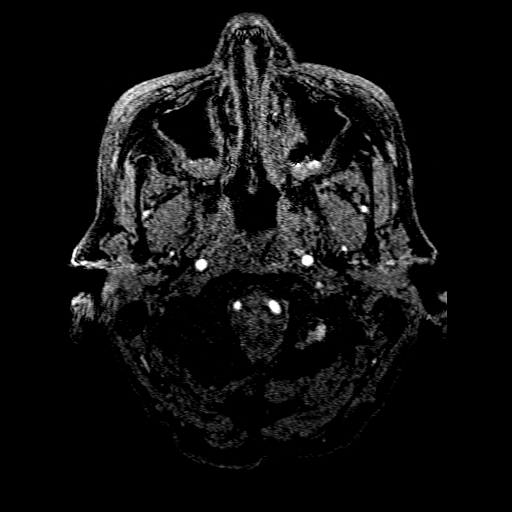
[im 12/176]
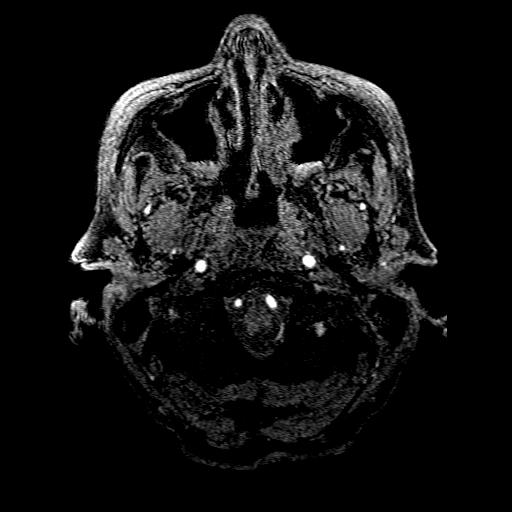
[im 15/176]
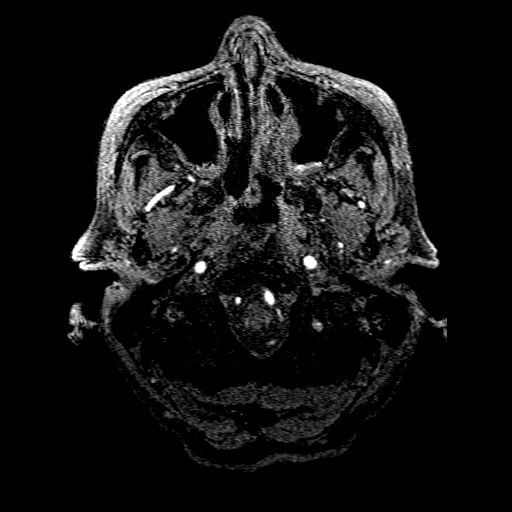
[im 19/176]
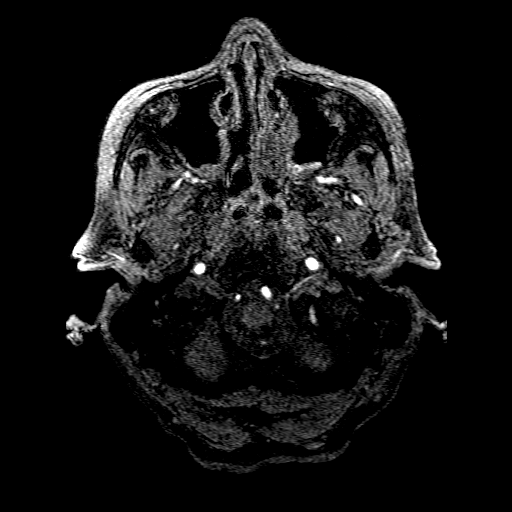
[im 23/176]
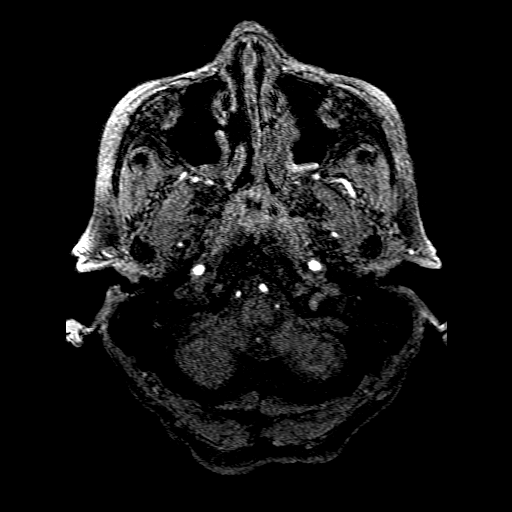
[im 27/176]
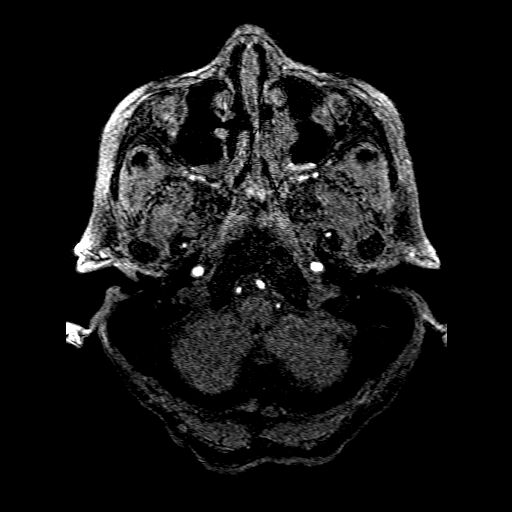
[im 30/176]
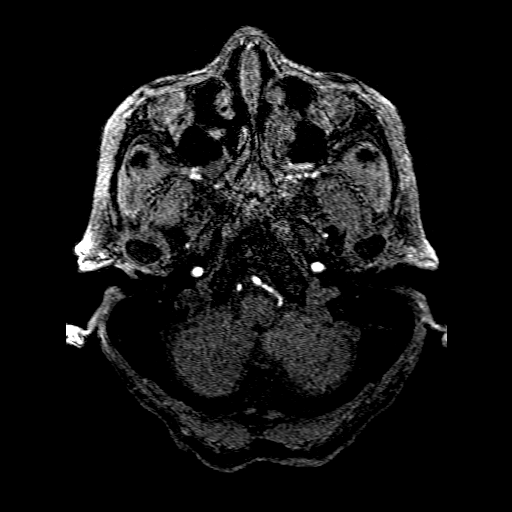
[im 34/176]
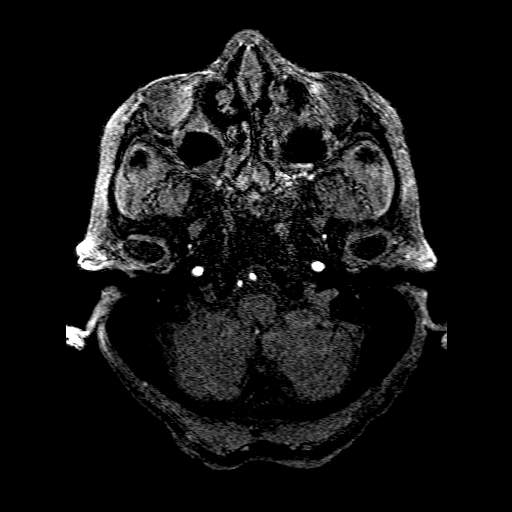
[im 38/176]
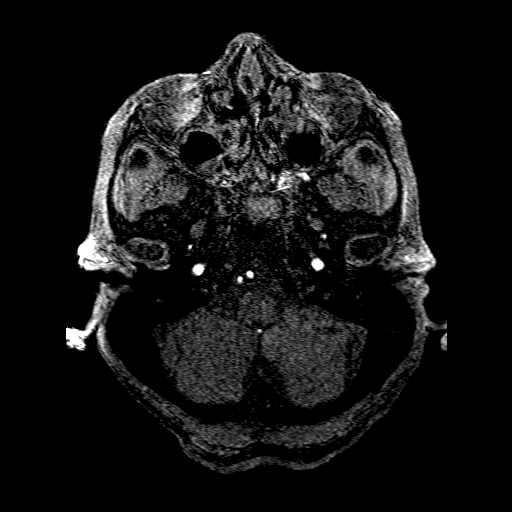
[im 41/176]
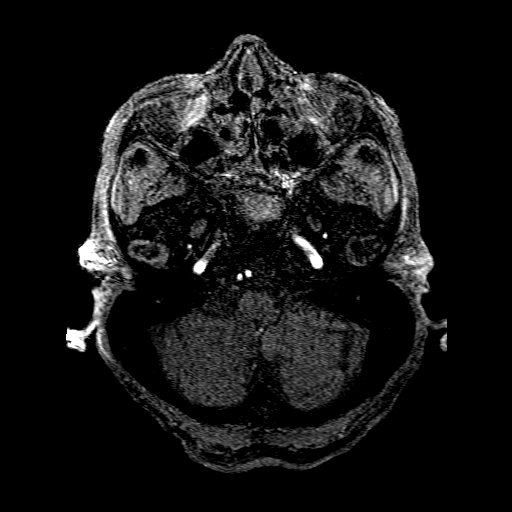
[im 56/176]
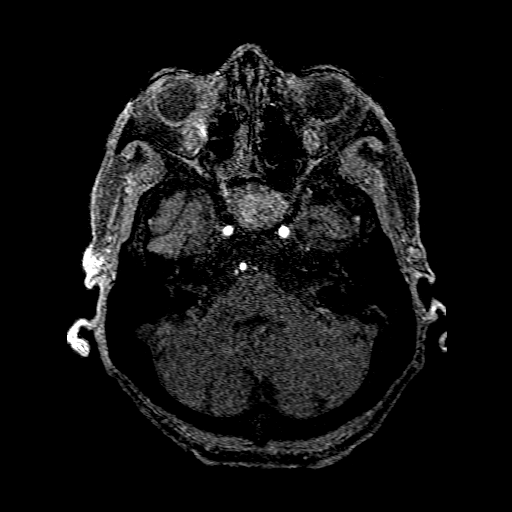
[im 79/176]
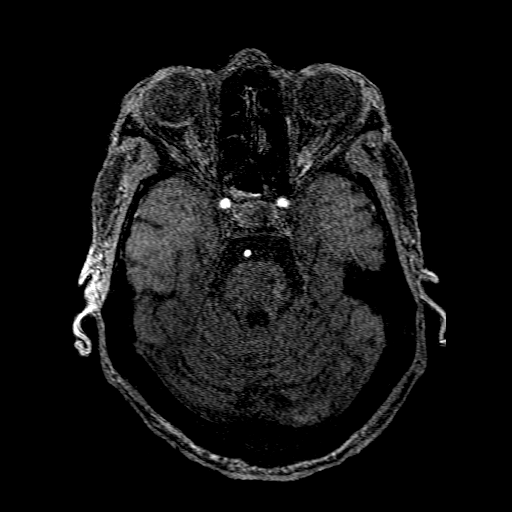
[im 90/176]
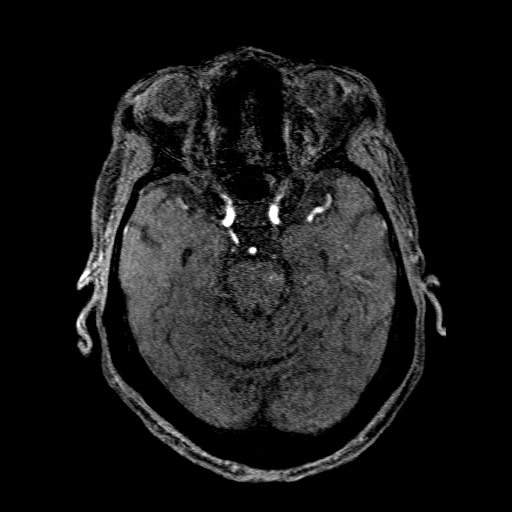
[im 101/176]
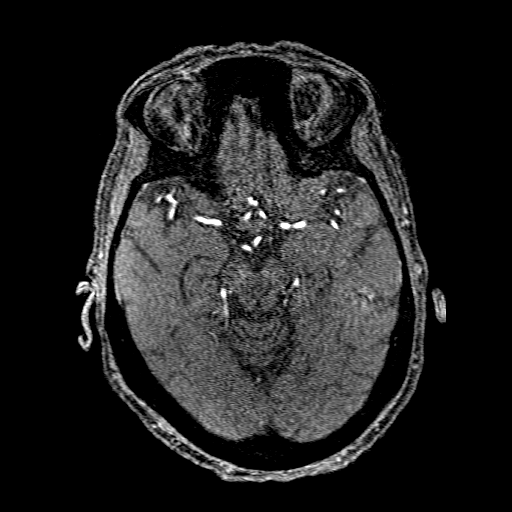
[im 123/176]
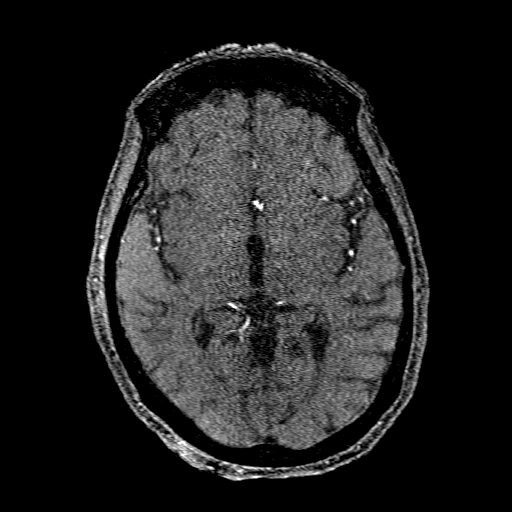
[im 146/176]
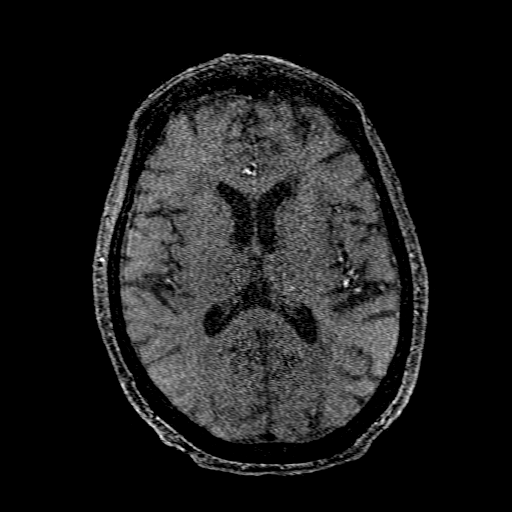
[im 149/176]
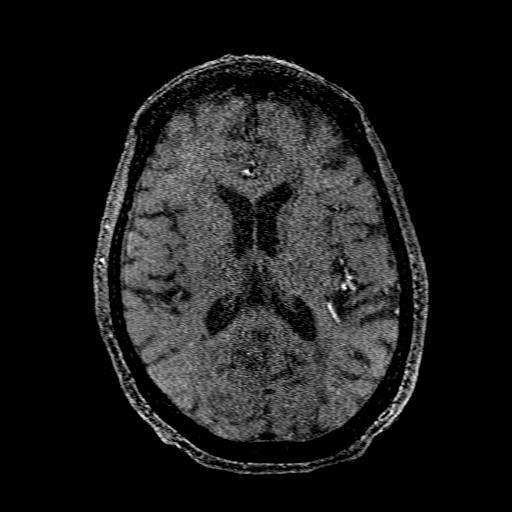
[im 168/176]
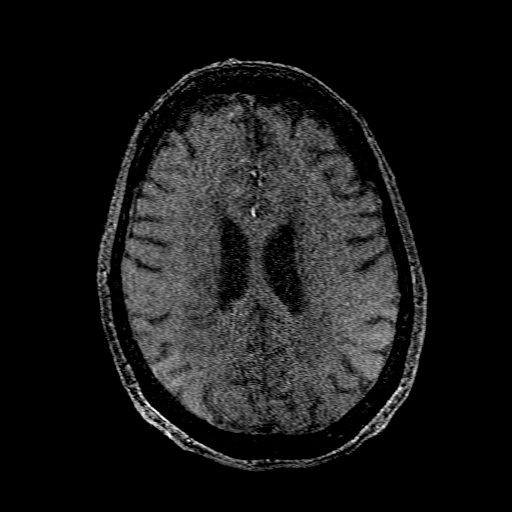

[20 of 48 positions shown; findings below may reference images not displayed]

FINDINGS: POSTERIOR CIRCULATION:

--Vertebral arteries: Normal

--Inferior cerebellar arteries: Normal.

--Basilar artery: Normal.

--Superior cerebellar arteries: Normal.

--Posterior cerebral arteries: Normal.

ANTERIOR CIRCULATION:

--Intracranial internal carotid arteries: Normal.

--Anterior cerebral arteries (ACA): Normal.

--Middle cerebral arteries (MCA): There is a short segment area of
signal loss within the distal M1 segment of the right MCA. Otherwise
normal.

ANATOMIC VARIANTS: None
IMPRESSION: 1. Short segment area of signal loss in the distal M1 segment of the
right MCA, possibly a short segment occlusion.
2. Otherwise normal intracranial MRA.

## 2021-01-25 IMAGING — DX DG CHEST 1V PORT
1 series · 1 of 1 positions shown · non-contrast
Comparison: [DATE]

CLINICAL DATA: Left-sided weakness and facial droop.

EXAM:
PORTABLE CHEST 1 VIEW

[chest]
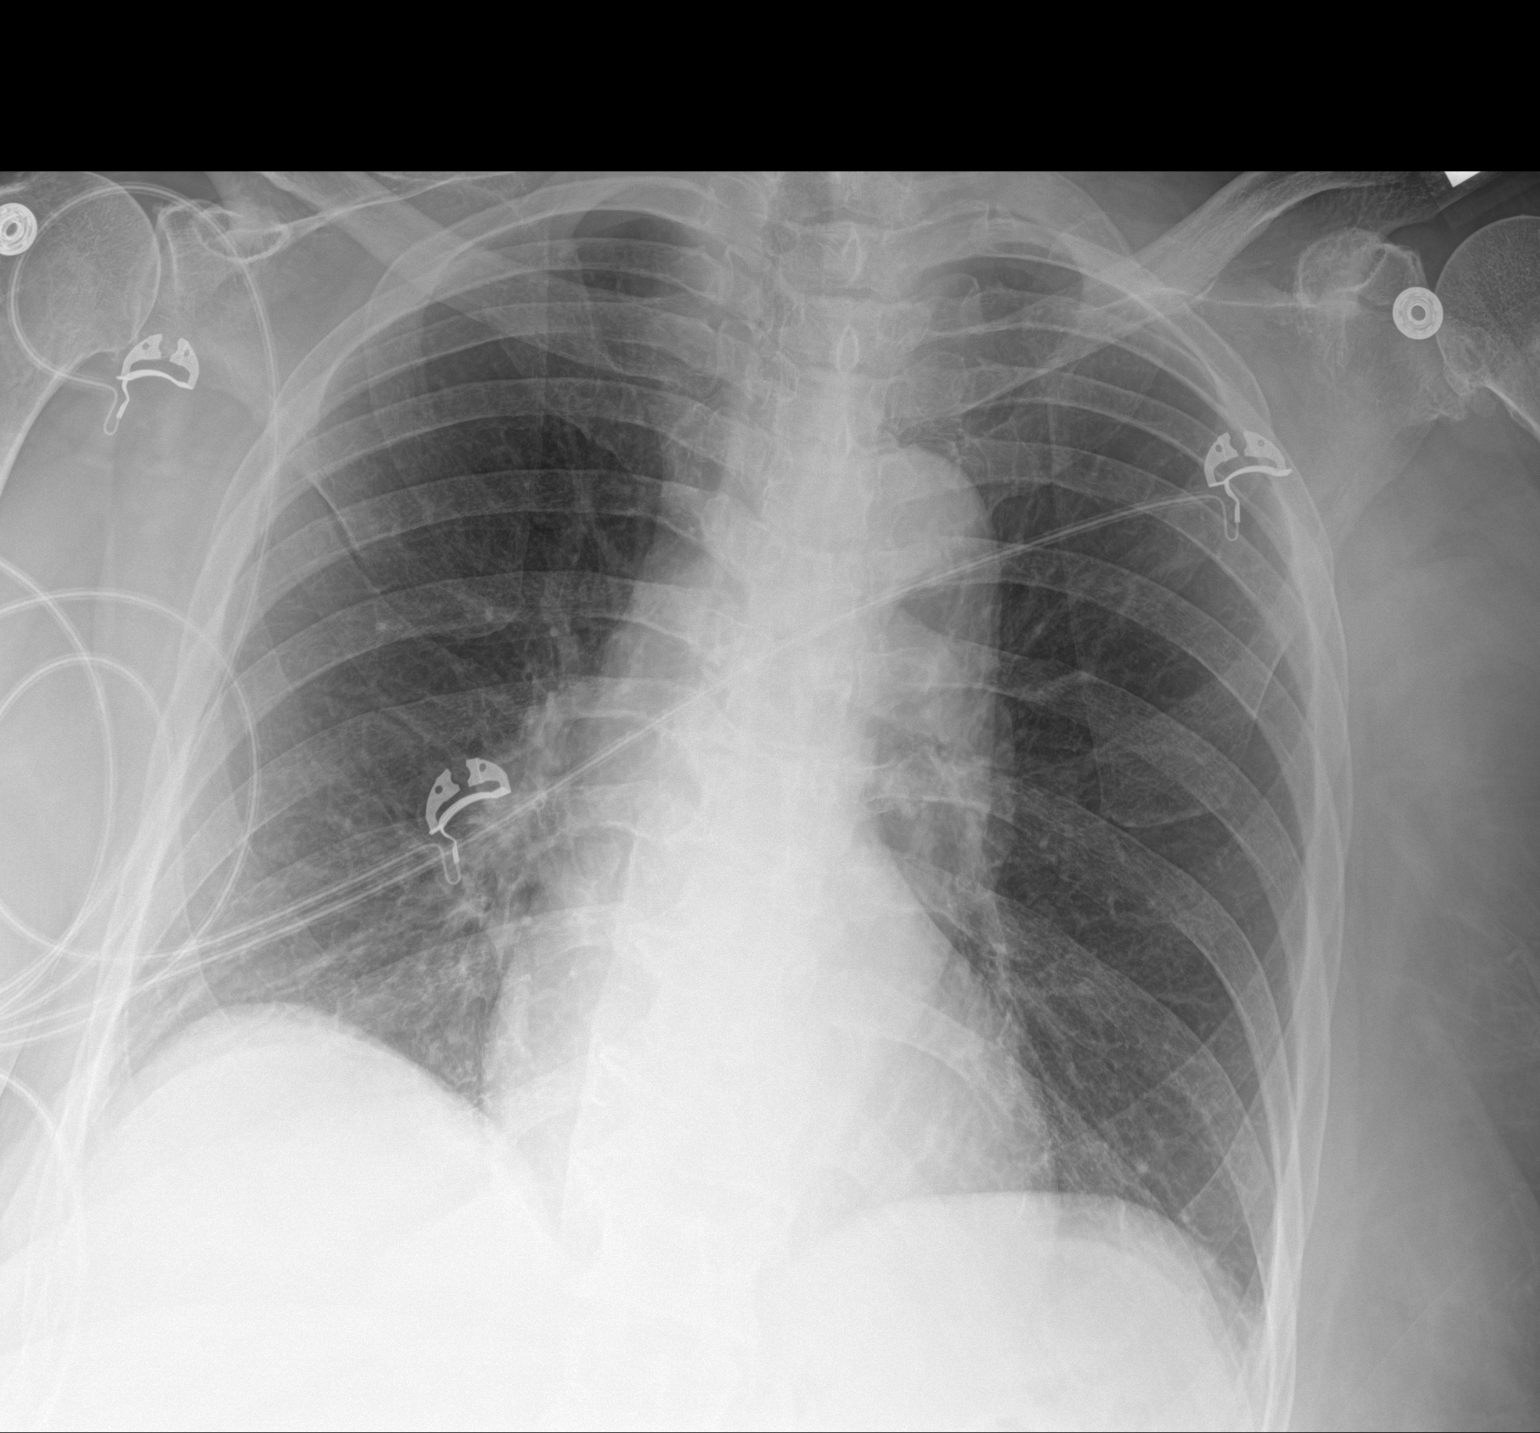

[1 of 1 positions shown; findings below may reference images not displayed]

FINDINGS: Very mild atelectasis is seen within the bilateral lung bases. There
is no evidence of acute infiltrate, pleural effusion or
pneumothorax. The heart size and mediastinal contours are within
normal limits. The visualized skeletal structures are unremarkable.
IMPRESSION: Very mild bibasilar atelectasis.

## 2021-01-25 MED ORDER — ALBUTEROL SULFATE (2.5 MG/3ML) 0.083% IN NEBU: 2.5000 mg | INHALATION_SOLUTION | Freq: Four times a day (QID) | RESPIRATORY_TRACT | Status: DC | PRN

## 2021-01-25 MED ORDER — ACETAMINOPHEN 160 MG/5ML PO SOLN: 650.0000 mg | ORAL | Status: DC | PRN

## 2021-01-25 MED ORDER — ACETAMINOPHEN 325 MG PO TABS
650.0000 mg | ORAL_TABLET | ORAL | Status: DC | PRN
  Administered 2021-01-26 – 2021-01-27 (×4): 650 mg via ORAL
  Filled 2021-01-25 (×4): qty 2

## 2021-01-25 MED ORDER — ACETAMINOPHEN 650 MG RE SUPP: 650.0000 mg | RECTAL | Status: DC | PRN

## 2021-01-25 MED ORDER — ASPIRIN EC 325 MG PO TBEC
325.0000 mg | DELAYED_RELEASE_TABLET | Freq: Every day | ORAL | Status: DC
  Administered 2021-01-26: 325 mg via ORAL
  Filled 2021-01-25: qty 1

## 2021-01-25 MED ORDER — SENNOSIDES-DOCUSATE SODIUM 8.6-50 MG PO TABS: 1.0000 | ORAL_TABLET | Freq: Every evening | ORAL | Status: DC | PRN

## 2021-01-25 MED ORDER — ENOXAPARIN SODIUM 30 MG/0.3ML IJ SOSY
30.0000 mg | PREFILLED_SYRINGE | INTRAMUSCULAR | Status: DC
  Administered 2021-01-25 – 2021-01-27 (×3): 30 mg via SUBCUTANEOUS
  Filled 2021-01-25 (×3): qty 0.3

## 2021-01-25 MED ORDER — CLOPIDOGREL BISULFATE 75 MG PO TABS: 75.0000 mg | ORAL_TABLET | Freq: Every day | ORAL | Status: DC

## 2021-01-25 MED ORDER — LACTATED RINGERS IV BOLUS
1000.0000 mL | Freq: Once | INTRAVENOUS | Status: AC
  Administered 2021-01-25: 1000 mL via INTRAVENOUS

## 2021-01-25 MED ORDER — ATORVASTATIN CALCIUM 10 MG PO TABS
20.0000 mg | ORAL_TABLET | Freq: Every day | ORAL | Status: DC
  Administered 2021-01-26 – 2021-01-28 (×3): 20 mg via ORAL
  Filled 2021-01-25 (×3): qty 2

## 2021-01-25 MED ORDER — STROKE: EARLY STAGES OF RECOVERY BOOK
Freq: Once | Status: AC
  Administered 2021-01-25: 1
  Filled 2021-01-25: qty 1

## 2021-01-25 MED ORDER — SODIUM CHLORIDE 0.9 % IV SOLN: INTRAVENOUS | Status: DC

## 2021-01-25 NOTE — ED Notes (Signed)
Pt transported to MRI 

## 2021-01-25 NOTE — ED Provider Notes (Signed)
[facility] EMERGENCY DEPARTMENT Provider Note   CSN: [id number] Arrival date & time: 01/25/21  1330     History Chief Complaint  Patient presents with   Weakness    [name] is a 87 y.o. female.  HPI 87 year old female presents with left sided weakness.  Started yesterday evening.  History is mostly from the daughter who cares for her.  Had a stroke involving left side of her body a few weeks ago.  Seem to recover and is back home.  Daughter feels like she is drinking okay though since last night she has been holding meds in her left cheek.  Now having worsening weakness in the left upper and lower extremity.  No obvious infectious symptoms such as fever.  No vomiting.  She does wonder if she is little dehydrated and has not had a bowel movement in a day or so.  She had low blood pressure on televisit today and was told to stop the lisinopril and call EMS. BP was in the 90s per family.  Past Medical History:  Diagnosis Date   Allergic rhinitis    Arthritis    Carpal tunnel syndrome    Deep venous thrombosis (HCC)    bilateral legs   Degenerative arthritis    right knee   Hypertension    Lumbar degenerative disc disease    Lump or mass in breast    Paresthesia    Syncope 05/24/2013   Vitamin D deficiency     Patient Active Problem List   Diagnosis Date Noted   Acute CVA (cerebrovascular accident) ([place]) 01/25/2021   AKI (acute kidney injury) ([place]) 01/25/2021   CVA (cerebral vascular accident) ([place]) 01/06/2021   Essential hypertension 01/06/2021   Arthritis 01/06/2021   Pain due to onychomycosis of toenails of both feet 12/14/2018   Presbycusis of both ears 09/22/2015   Syncope 05/24/2013    Past Surgical History:  Procedure Laterality Date   ABDOMINAL HYSTERECTOMY     CARPAL TUNNEL RELEASE Right    CATARACT EXTRACTION Bilateral    HEMORROIDECTOMY     IVC Filter     TOTAL HIP ARTHROPLASTY Right    ULNAR NERVE TRANSPOSITION Right       OB History   No obstetric history on file.     Family History  Problem Relation Age of Onset   Diabetes Mother    Kidney failure Father    Cancer - Lung Brother    Cancer - Prostate Brother    Kidney failure Son     Social History   Tobacco Use   Smoking status: Every Day    Packs/day: 10.00    Years: 30.00    Pack years: 300.00    Types: Cigarettes   Smokeless tobacco: Never  Substance Use Topics   Alcohol use: Yes    Comment: occasional   Drug use: No    Home Medications Prior to Admission medications   Medication Sig Start Date End Date Taking? Authorizing Provider  acetaminophen (TYLENOL) 325 MG tablet Take 2 tablets (650 mg total) by mouth every 4 (four) hours as needed for mild pain (or temp > 37.5 C (99.5 F)). 01/07/21  Yes Dennison Mascot, PA-C  albuterol (VENTOLIN HFA) 108 (90 Base) MCG/ACT inhaler Inhale 1-2 puffs into the lungs every 6 (six) hours as needed for wheezing or shortness of breath.   Yes [provider]  aspirin 325 MG EC tablet Take 1 tablet (325 mg total) by  mouth daily. 01/08/21  Yes Regalado, Belkys A, MD  atorvastatin (LIPITOR) 20 MG tablet Take 1 tablet (20 mg total) by mouth daily. 01/07/21  Yes Dennison Mascot, PA-C  clopidogrel (PLAVIX) 75 MG tablet Take 1 tablet (75 mg total) by mouth daily. 01/08/21  Yes Regalado, Belkys A, MD  diclofenac sodium (VOLTAREN) 1 % GEL Apply 1 application topically 4 (four) times daily as needed (knee and hand pain).   Yes [provider]  ondansetron (ZOFRAN-ODT) 4 MG disintegrating tablet Take 4 mg by mouth every 8 (eight) hours as needed for nausea or vomiting. 01/13/21  Yes [provider]  traMADol (ULTRAM) 50 MG tablet Take 50 mg by mouth 2 (two) times daily as needed (pain).   Yes [provider]  lisinopril-hydrochlorothiazide (PRINZIDE,ZESTORETIC) 20-12.5 MG per tablet Take 1 tablet by mouth 2 (two) times daily. Patient not taking: No sig reported    [provider]    Allergies    Codeine and Penicillins  Review of Systems   Review of Systems  Constitutional:  Negative for fever.  Gastrointestinal:  Negative for diarrhea and vomiting.  Neurological:  Positive for weakness.  Psychiatric/Behavioral:  Positive for confusion.   All other systems reviewed and are negative.  Physical Exam Updated Vital Signs BP 108/65   Pulse 87   Temp 98 F (36.7 C) (Oral)   Resp 15   SpO2 94%   Physical Exam Vitals and nursing note reviewed.  Constitutional:      General: She is not in acute distress.    Appearance: She is well-developed. She is not diaphoretic.  HENT:     Head: Normocephalic and atraumatic.     Right Ear: External ear normal.     Left Ear: External ear normal.     Nose: Nose normal.  Eyes:     General:        Right eye: No discharge.        Left eye: No discharge.     Pupils: Pupils are equal, round, and reactive to light.  Cardiovascular:     Rate and Rhythm: Normal rate and regular rhythm.     Heart sounds: Normal heart sounds.  Pulmonary:     Effort: Pulmonary effort is normal.     Breath sounds: Normal breath sounds.  Abdominal:     Palpations: Abdomen is soft.     Tenderness: There is no abdominal tenderness.  Skin:    General: Skin is warm and dry.  Neurological:     Mental Status: She is alert.     Comments: Patient has 5/5 strength in right upper extremity.  3/5 strength in right lower extremity and is flaccid in the left upper extremity and unable to lift her left lower extremity off the bed though it looks like she is trying.  Psychiatric:        Mood and Affect: Mood is not anxious.    ED Results / Procedures / Treatments   Labs (all labs ordered are listed, but only abnormal results are displayed) Labs Reviewed  COMPREHENSIVE METABOLIC PANEL - Abnormal; Notable for the following components:      Result Value   Sodium 134 (*)    CO2 20 (*)    Glucose, Bld 111 (*)    BUN 59 (*)     Creatinine, Ser 1.73 (*)    Albumin 3.4 (*)    GFR, Estimated 28 (*)    All other components within normal limits  URINALYSIS,  ROUTINE W REFLEX MICROSCOPIC - Abnormal; Notable for the following components:   APPearance HAZY (*)    All other components within normal limits  RESP PANEL BY RT-PCR (FLU A&B, COVID) ARPGX2  ETHANOL  PROTIME-INR  APTT  RAPID URINE DRUG SCREEN, HOSP PERFORMED  CBC WITH DIFFERENTIAL/PLATELET  CBC  DIFFERENTIAL  BASIC METABOLIC PANEL    EKG None  Radiology CT HEAD WO CONTRAST (5MM)  Result Date: 01/25/2021 CLINICAL DATA:  Left-sided weakness, facial droop EXAM: CT HEAD WITHOUT CONTRAST TECHNIQUE: Contiguous axial images were obtained from the base of the skull through the vertex without intravenous contrast. COMPARISON:  01/05/2021 FINDINGS: Brain: Age related volume loss. Extensive diffuse bilateral low-density throughout the deep white matter, likely chronic small vessel disease. No acute intracranial abnormality. Specifically, no hemorrhage, hydrocephalus, mass lesion, acute infarction, or significant intracranial injury. Vascular: No hyperdense vessel or unexpected calcification. Skull: No acute calvarial abnormality. Sinuses/Orbits: Mucosal thickening throughout the paranasal sinuses. No air-fluid levels. Other: None IMPRESSION: Extensive chronic small vessel disease. No acute intracranial abnormality. Electronically Signed   By: Rolm Baptise M.D.   On: 01/25/2021 17:11   MR BRAIN WO CONTRAST  Result Date: 01/25/2021 CLINICAL DATA:  Neurological deficit, acute, stroke suspected. Left-sided weakness. EXAM: MRI HEAD WITHOUT CONTRAST TECHNIQUE: Multiplanar, multiecho pulse sequences of the brain and surrounding structures were obtained without intravenous contrast. COMPARISON:  Head CT earlier same day. FINDINGS: Brain: Diffusion imaging shows acute infarction within the right hemisphere extensively affecting the subcortical white matter of the frontal and  parietal regions, with a focal subcortical white matter infarction of the deep insula. This pattern suggests watershed infarction. Minimal chronic small-vessel ischemic changes affect the pons. No focal cerebellar finding. Cerebral hemispheres otherwise show chronic small-vessel ischemic changes of the deep and subcortical white matter of both hemispheres. There is an old small right parietal cortical and subcortical infarction. No evidence of mass, hemorrhage, hydrocephalus or extra-axial collection. Vascular: Major vessels at the base of the brain show flow. Skull and upper cervical spine: Negative Sinuses/Orbits: Mucosal inflammatory changes of the paranasal sinuses. Orbits negative. Other: None IMPRESSION: Acute infarction affecting the deep and subcortical white matter of the right hemisphere, primarily in the frontal and parietal region with a punctate focus in the deep insular white matter. This pattern/distribution suggests watershed infarction, though there does appear to be flow within the right internal carotid artery. Background pattern of chronic small vessel ischemic changes of the cerebral hemispheric white matter. Old small right parietal cortical infarction. Electronically Signed   By: Nelson Chimes M.D.   On: 01/25/2021 18:40   DG Chest Portable 1 View  Result Date: 01/25/2021 CLINICAL DATA:  Left-sided weakness and facial droop. EXAM: PORTABLE CHEST 1 VIEW COMPARISON:  November 07, 2009 FINDINGS: Very mild atelectasis is seen within the bilateral lung bases. There is no evidence of acute infiltrate, pleural effusion or pneumothorax. The heart size and mediastinal contours are within normal limits. The visualized skeletal structures are unremarkable. IMPRESSION: Very mild bibasilar atelectasis. Electronically Signed   By: Virgina Norfolk M.D.   On: 01/25/2021 17:56    Procedures Procedures   Medications Ordered in ED Medications  aspirin EC tablet 325 mg (has no administration in time  range)  atorvastatin (LIPITOR) tablet 20 mg (has no administration in time range)  clopidogrel (PLAVIX) tablet 75 mg (has no administration in time range)  albuterol (PROVENTIL) (2.5 MG/3ML) 0.083% nebulizer solution 2.5 mg (has no administration in time range)  acetaminophen (TYLENOL) tablet 650 mg (has  no administration in time range)    Or  acetaminophen (TYLENOL) 160 MG/5ML solution 650 mg (has no administration in time range)    Or  acetaminophen (TYLENOL) suppository 650 mg (has no administration in time range)  senna-docusate (Senokot-S) tablet 1 tablet (has no administration in time range)  enoxaparin (LOVENOX) injection 30 mg (30 mg Subcutaneous Given 01/25/21 2135)  0.9 %  sodium chloride infusion ( Intravenous New Bag/Given 01/25/21 2134)  lactated ringers bolus 1,000 mL (0 mLs Intravenous Stopped 01/25/21 1954)   stroke: mapping our early stages of recovery book (1 each Does not apply Given 01/25/21 2135)    ED Course  I have reviewed the triage vital signs and the nursing notes.  Pertinent labs & imaging results that were available during my care of the patient were reviewed by me and considered in my medical decision making (see chart for details).    MDM Rules/Calculators/A&P                           Patient's MRI does show acute stroke, probably from hypotension/acute kidney injury.  She is no longer hypertensive here.  No clear cause for her acute kidney injury.  She will be given IV fluids.  Neurology was consulted in the hospital for further work-up. Final Clinical Impression(s) / ED Diagnoses Final diagnoses:  Acute ischemic stroke ([place])  Acute kidney injury [facility])    Rx / DC Orders ED Discharge Orders     None        Sherwood Gambler, MD 01/25/21 2321

## 2021-01-25 NOTE — Patient Outreach (Signed)
[facility]) Care Management  01/25/2021  [name] 1929-04-03 [id number]   RED ON EMMI ALERT - Stroke Day # 13 Date: 10/7 Red Alert Reason: Feeling worse overall and New problems with walking/speaking/talking/seeing   No outreach attempt made today as this RNCM spoke with member and daughter on 10/7 regarding the above alert.     Plan: RN CM will follow up within the week as planned.  Valente David, [place], MSN [place] [phone]

## 2021-01-25 NOTE — ED Notes (Signed)
Patient transported to MRI 

## 2021-01-25 NOTE — Consult Note (Signed)
NEUROLOGY CONSULTATION NOTE   Date of service: January 25, 2021 Patient Name: [name]:  [id number] DOB:  1929/03/06 Reason for consult: "Worsening R MCA stroke and L sided weakness" Requesting Provider: Marcelyn Bruins, MD _ _ _   _ __   _ __ _ _  __ __   _ __   __ _  History of Present Illness  [name] is a 87 y.o. female with PMH significant for hypertension, arthritis, current smoker, cervical degenerative disc with C3-C4 large central disc herniation with early abnormal T2 signal within the cord on recent MRI, hx of bilateral DVTs, recent scattered right MCA territory infarct with high-grade right M1 stenosis on aspirin and Plavix for 3 months followed by aspirin alone who presents with worsening left-sided weakness and found to have expansion of right MCA territory infarct.  She was recently evaluated by her team a couple weeks ago for left-sided weakness and found to have a right MCA infarct with high-grade right M1 stenosis.  She was doing much better and was discharged back home.  She went to bed at 2030 on 01/23/21 and woke up with weakness on the left. Feels like this has gotten worse since.  She was evaluated by her PCP on tele. Daughter mentioned systolic blood pressure in 90s from her baseline in 120s-130s. She was told to stop lisinopril and come to the emergency department.    Daughter reports confusion about aspirin and they thought aspirin was for pain. She has not been taking aspirin regularly since discharge. Has been taking plavix daily thou.  Daughte also reports that patient has cut down smoking significantly and only smokes 1-2 cigarettes every other day.  Work-up here with an MRI brain without contrast demonstrate expansion of the scattered right MCA infarct.  LKW: 01/23/21 at 2030 tNK/thrombectomy: outside window mRS: 4 NIHSS components Score: Comment  1a Level of Conscious 0[x]  1[]  2[]  3[]      1b LOC Questions 0[]  1[x]  2[]       1c LOC  Commands 0[x]  1[]  2[]       2 Best Gaze 0[x]  1[]  2[]       3 Visual 0[x]  1[]  2[]  3[]      4 Facial Palsy 0[]  1[x]  2[]  3[]      5a Motor Arm - left 0[]  1[]  2[]  3[]  4[x]  UN[]    5b Motor Arm - Right 0[x]  1[]  2[]  3[]  4[]  UN[]    6a Motor Leg - Left 0[]  1[]  2[]  3[x]  4[]  UN[]    6b Motor Leg - Right 0[x]  1[]  2[]  3[]  4[]  UN[]    7 Limb Ataxia 0[x]  1[]  2[]  3[]  UN[]     8 Sensory 0[x]  1[]  2[]  UN[]      9 Best Language 0[x]  1[]  2[]  3[]      10 Dysarthria 0[]  1[x]  2[]  UN[]      11 Extinct. and Inattention 0[x]  1[]  2[]       TOTAL: 10       ROS   Constitutional Denies weight loss, fever and chills.   HEENT Denies changes in vision and hearing.   Respiratory Denies SOB and cough.   CV Denies palpitations and CP   GI Denies abdominal pain, nausea, vomiting and diarrhea.   GU Denies dysuria and urinary frequency.   MSK Denies myalgia and joint pain.   Skin Denies rash and pruritus.   Neurological Denies headache and syncope.   Psychiatric Denies recent changes in mood. Denies anxiety and depression.    Past History   Past Medical History:  Diagnosis Date  . Allergic rhinitis   . Arthritis   . Carpal tunnel syndrome   . Deep venous thrombosis (HCC)    bilateral legs  . Degenerative arthritis    right knee  . Hypertension   . Lumbar degenerative disc disease   . Lump or mass in breast   . Paresthesia   . Syncope 05/24/2013  . Vitamin D deficiency    Past Surgical History:  Procedure Laterality Date  . ABDOMINAL HYSTERECTOMY    . CARPAL TUNNEL RELEASE Right   . CATARACT EXTRACTION Bilateral   . HEMORROIDECTOMY    . IVC Filter    . TOTAL HIP ARTHROPLASTY Right   . ULNAR NERVE TRANSPOSITION Right    Family History  Problem Relation Age of Onset  . Diabetes Mother   . Kidney failure Father   . Cancer - Lung Brother   . Cancer - Prostate Brother   . Kidney failure Son    Social History   Socioeconomic History  . Marital status: Widowed    Spouse name: Not on file  . Number of  children: 3  . Years of education: 46  . Highest education level: Not on file  Occupational History  . Occupation: retired  Tobacco Use  . Smoking status: Every Day    Packs/day: 10.00    Years: 30.00    Pack years: 300.00    Types: Cigarettes  . Smokeless tobacco: Never  Substance and Sexual Activity  . Alcohol use: Yes    Comment: occasional  . Drug use: No  . Sexual activity: Not on file  Other Topics Concern  . Not on file  Social History Narrative  . Not on file   Social Determinants of Health   Financial Resource Strain: Not on file  Food Insecurity: Not on file  Transportation Needs: Not on file  Physical Activity: Not on file  Stress: Not on file  Social Connections: Not on file   Allergies  Allergen Reactions  . Codeine Other (See Comments)    Passed out  . Penicillins Other (See Comments)    Passed out Has patient had a PCN reaction causing immediate rash, facial/tongue/throat swelling, SOB or lightheadedness with hypotension: Yes Has patient had a PCN reaction causing severe rash involving mucus membranes or skin necrosis: Unk Has patient had a PCN reaction that required hospitalization: No Has patient had a PCN reaction occurring within the last 10 years: No If all of the above answers are "NO", then may proceed with Cephalosporin use.     Medications  (Not in a hospital admission)    Vitals   Vitals:   01/25/21 1854 01/25/21 1900 01/25/21 1915 01/25/21 2015  BP: 112/64 114/60 (!) 112/56 108/65  Pulse: 90 89 86 87  Resp: 17 (!) 24 17 15   Temp:      TempSrc:      SpO2: 96% 96% 97% 94%     There is no height or weight on file to calculate BMI.  Physical Exam   General: Laying comfortably in bed; in no acute distress.  HENT: Normal oropharynx and mucosa. Normal external appearance of ears and nose.  Neck: Supple, no pain or tenderness  CV: No JVD. No peripheral edema.  Pulmonary: Symmetric Chest rise. Normal respiratory effort.  Abdomen:  Soft to touch, non-tender.  Ext: No cyanosis, edema, or deformity  Skin: No rash. Normal palpation of skin.   Musculoskeletal: Normal digits and nails by inspection. No clubbing.   Neurologic  Examination  Mental status/Cognition: Alert, oriented to self, place, month and year, good attention.  Speech/language: Fluent, comprehension intact, object naming intact, repetition intact.  Cranial nerves:   CN II Pupils equal and reactive to light, no VF deficits    CN III,IV,VI EOM intact, no gaze preference or deviation, no nystagmus    CN V normal sensation in V1, V2, and V3 segments bilaterally    CN VII L facial drrop   CN VIII normal hearing to speech    CN IX & X normal palatal elevation, no uvular deviation    CN XI 5/5 head turn and 5/5 shoulder shrug on right. 0/5 on the left.   CN XII midline tongue protrusion    Motor:  Muscle bulk: poor, tone flaccid in LUE Mvmt Root Nerve  Muscle Right Left Comments  SA C5/6 Ax Deltoid     EF C5/6 Mc Biceps 5 0   EE C6/7/8 Rad Triceps 5 0   WF C6/7 Med FCR  0   WE C7/8 PIN ECU  0   F Ab C8/T1 U ADM/FDI 5 1   HF L1/2/3 Fem Illopsoas 4+ 1   KE L2/3/4 Fem Quad 5 2   DF L4/5 D Peron Tib Ant 5 3   PF S1/2 Tibial Grc/Sol 5 3    Reflexes:  Right Left Comments  Pectoralis      Biceps (C5/6) 2 2   Brachioradialis (C5/6) 2 2    Triceps (C6/7) 2 2    Patellar (L3/4) 1 1    Achilles (S1)      Hoffman      Plantar     Jaw jerk    Sensation:  Light touch Intact throughout   Pin prick    Temperature    Vibration   Proprioception    Coordination/Complex Motor:  - Finger to Nose intact on the right. Unable to do with LUE - Heel to shin unable to do - Rapid alternating movement are slowed - Gait: she is plegic on the left, unsafe to assess her gait.  Labs   CBC:  Recent Labs  Lab 01/25/21 1520  WBC 8.7  NEUTROABS 6.1  HGB 14.3  HCT 44.9  MCV 98.7  PLT 295    Basic Metabolic Panel:  Lab Results  Component Value Date   NA  134 (L) 01/25/2021   K 5.0 01/25/2021   CO2 20 (L) 01/25/2021   GLUCOSE 111 (H) 01/25/2021   BUN 59 (H) 01/25/2021   CREATININE 1.73 (H) 01/25/2021   CALCIUM 9.6 01/25/2021   GFRNONAA 28 (L) 01/25/2021   GFRAA 64 05/21/2020   Lipid Panel:  Lab Results  Component Value Date   LDLCALC 53 01/07/2021   HgbA1c:  Lab Results  Component Value Date   HGBA1C 5.7 (H) 01/07/2021   Urine Drug Screen:     Component Value Date/Time   LABOPIA NONE DETECTED 01/06/2021 0103   COCAINSCRNUR NONE DETECTED 01/06/2021 0103   LABBENZ NONE DETECTED 01/06/2021 0103   AMPHETMU NONE DETECTED 01/06/2021 0103   THCU NONE DETECTED 01/06/2021 0103   LABBARB NONE DETECTED 01/06/2021 0103    Alcohol Level     Component Value Date/Time   ETH <10 01/25/2021 1501    CT Head without contrast: Personally reviewed and CTH was negative for a large hypodensity concerning for a large territory infarct or hyperdensity concerning for an ICH  MR Angio head without contrast and Carotid Duplex BL: Pending.  MRI Brain: Personally reviewed and  notable for Acute infarction affecting the deep and subcortical white matter of the right hemisphere, primarily in the frontal and parietal region with a punctate focus in the deep insular white matter. This pattern/distribution suggests watershed infarction, though there does appear to be flow within the right internal carotid artery.  Impression   LASHONE STAUBER is a 87 y.o. female with PMH significant for hypertension, arthritis, current smoker, cervical degenerative disc with C3-C4 large central disc herniation with early abnormal T2 signal within the cord on recent MRI, hx of bilateral DVTs, recent scattered right MCA territory infarct with high-grade right M1 stenosis on aspirin and Plavix for 3 months followed by aspirin alone who presents with worsening left-sided weakness and found to have expansion of right MCA territory infarct.. Her neurologic examination is  notable for plegia in LUE and significant weakness in LLE with a NIHSS of 10.  Stroke could be related to progression of the R MCA stenosis vs potential dehydration and possible hypotension. Agree with holding lisinopril and  other antihypertensives and starting fluids.  Primary Diagnosis:  Cerebral infarction due to occlusion or stenosis of right middle cerebral artery.   Secondary Diagnosis: Essential (primary) hypertension and Acute Kidney Failure  Recommendations  Plan:  - Frequent Neuro checks per stroke unit protocol - I ordered Vascular imaging with MRA Angio Head without contrast and US Carotid doppler - Hold off on TTE. She just  had one with EF of 60-65% and grade 1 diastolic dysfunction. - Recommend obtaining Lipid panel with LDL - Please start statin if LDL > 70 - Recommend HbA1c - Antithrombotic - continue aspirin 325mg  daily along with plavix 75mg  daily for 90 days followed by aspirin 325mg  daily alone. I spoke to patient and daughter and clarified the role of DAPT to prevent further progression of the noted high grade R MCA stenosis. - Recommend DVT ppx - SBP goal - permissive hypertension first 24 h < 220/110. Hold home meds.  - Agree with fluids at 129ml/hr. - Recommend Telemetry monitoring for arrythmia - Recommend bedside swallow screen prior to PO intake. - Stroke education booklet - Recommend PT/OT/SLP consult - Stroke team to follow along.   Smoking: Has significantly cut down to just 1-2 cigarettes every other day. Commend her progress and encouraged her to quit smoking. - Nicotine patch PRN  AKI: potential dehydration vs lisinopril? Was maybe mildly hypotensive. - agree with fluids. - Agree with holding lisinopril and avoid nephrotoxic meds.  _______________________________________________________  Plan discussed with Dr. Trilby Drummer in person and with patient and her daughter at bedside.  Thank you for the opportunity to take part in the care of this  patient. If you have any further questions, please contact the neurology consultation attending.  Signed,  [place] Pager Number [phone] _ _ _   _ __   _ __ _ _  __ __   _ __   __ _

## 2021-01-25 NOTE — ED Provider Notes (Signed)
Emergency Medicine Provider Triage Evaluation Note  [name] , a 87 y.o. female  was evaluated in triage.  Pt complains of left sided weakness.   Discussed with [name] pt's daughter.  It seems that patient had left-sided weakness last month was diagnosed with a stroke able to go home with complete resolution of her weakness however yesterday morning patient was complaining of left leg and arm weakness seems that her symptoms have continued throughout today.  She was seen by video conference with her PCP this morning who told her to go to the emergency room.  She is brought to the emergency room and I initially evaluated her in triage 3:02 PM   Given that is been greater than 24 hours since her symptoms began she is outside of acute stroke window.  She is Lucianne Lei negative.  Will obtain imaging including MRI.  Is not a code stroke at this time given that she is outside of stroke window.  Review of Systems  Positive: Left-sided weakness Negative: Fever  Physical Exam  BP (!) 107/59 (BP Location: Left Arm)   Pulse 90   Temp 98 F (36.7 C) (Oral)   Resp 14   SpO2 99%  Gen:   Awake, no distress   Resp:  Normal effort  MSK:   Moves extremities on right side without difficulty.  Unable to lift left leg off table and unable to lift left arm off bed.  Smile asymmetric  EOMI  Oriented x3  Medical Decision Making  Medically screening exam initiated at 2:56 PM.  Appropriate orders placed.  VESPER TRANT was informed that the remainder of the evaluation will be completed by another provider, this initial triage assessment does not replace that evaluation, and the importance of remaining in the ED until their evaluation is complete.  CT head, stroke labs and MRI ordered.   Tedd Sias, [place] 01/25/21 [place], [place], DO 01/26/21 [phone]

## 2021-01-25 NOTE — H&P (Signed)
History and Physical   [name]:[id number] DOB: 1928-09-06 DOA: 01/25/2021  PCP: Nolene Ebbs, MD   Patient coming from: Home  Chief Complaint: Weakness  HPI: [name] is a 87 y.o. female with medical history significant of stroke, hypertension, DVT, degenerative disc disease, syncope who presents with left-sided arm and leg weakness starting yesterday.  Patient recently diagnosed with a stroke last month also had left-sided weakness at that time she went home with near complete resolution of her deficits.  Noted to have significant left leg and arm weakness starting yesterday as well.  She had a video visit with her PCP today recommended go to the ED also noted during the visit has been having blood pressures in the 90 systolic at home and was told to stop antihypertensives. Does not report decreased PO intake or other signs of dehydration.  Does report some confusion about her aspirin medication thought this was for pain and was not taking this consistently.  Patient denies fevers, chills, chest pain, shortness of breath, abdominal pain, constipation, diarrhea, nausea, vomiting.  ED Course: Vital signs in the ED were stable.  Lab work-up showed CMP with sodium 134, bicarb 20, BUN 59, creatinine elevated 1.73 from baseline of around 1, glucose 111, albumin 3.4.  CBC within normal notes.  PT and INR within normal limits.  Respiratory panel flu COVID-negative.  Ethanol level negative.  Urinalysis pending.  Chest x-ray showed very mild atelectasis, CT head showed no acute abnormality.  MR brain showed acute infarct of deep and subcortical white matter pattern consistent with watershed infarct also noted were old infarcts from previous stroke.  Patient received a liter of fluids in the ED.  Neurology was consulted and are seeing the patient.  Review of Systems: As per HPI otherwise all other systems reviewed and are negative.  Past Medical History:  Diagnosis Date    Allergic rhinitis    Arthritis    Carpal tunnel syndrome    Deep venous thrombosis (HCC)    bilateral legs   Degenerative arthritis    right knee   Hypertension    Lumbar degenerative disc disease    Lump or mass in breast    Paresthesia    Syncope 05/24/2013   Vitamin D deficiency     Past Surgical History:  Procedure Laterality Date   ABDOMINAL HYSTERECTOMY     CARPAL TUNNEL RELEASE Right    CATARACT EXTRACTION Bilateral    HEMORROIDECTOMY     IVC Filter     TOTAL HIP ARTHROPLASTY Right    ULNAR NERVE TRANSPOSITION Right     Social History  reports that she has been smoking. She has a 300.00 pack-year smoking history. She has never used smokeless tobacco. She reports current alcohol use. She reports that she does not use drugs.  Allergies  Allergen Reactions   Codeine Other (See Comments)    Passed out Pass out Passed out   Penicillins Other (See Comments)    Passed out Has patient had a PCN reaction causing immediate rash, facial/tongue/throat swelling, SOB or lightheadedness with hypotension: Yes Has patient had a PCN reaction causing severe rash involving mucus membranes or skin necrosis: Unk Has patient had a PCN reaction that required hospitalization: No Has patient had a PCN reaction occurring within the last 10 years: No If all of the above answers are "NO", then may proceed with Cephalosporin use.  Pass out Passed out Has patient had a PCN reaction causing immediate rash, facial/tongue/throat swelling, SOB  or lightheadedness with hypotension: Yes Has patient had a PCN reaction causing severe rash involving mucus membranes or skin necrosis: Unk Has patient had a PCN reaction that required hospitalization: No Has patient had a PCN reaction occurring within the last 10 years: No If all of the above answers are "NO", then may proceed with Cephalosporin use.    Family History  Problem Relation Age of Onset   Diabetes Mother    Kidney failure Father    Cancer  - Lung Brother    Cancer - Prostate Brother    Kidney failure Son   Reviewed on admission  Prior to Admission medications   Medication Sig Start Date End Date Taking? Authorizing Provider  acetaminophen (TYLENOL) 325 MG tablet Take 2 tablets (650 mg total) by mouth every 4 (four) hours as needed for mild pain (or temp > 37.5 C (99.5 F)). 01/07/21   Dennison Mascot, PA-C  albuterol (VENTOLIN HFA) 108 (90 Base) MCG/ACT inhaler Inhale 1-2 puffs into the lungs every 6 (six) hours as needed for wheezing or shortness of breath.    [provider]  aspirin 325 MG EC tablet Take 1 tablet (325 mg total) by mouth daily. 01/08/21   Regalado, Belkys A, MD  atorvastatin (LIPITOR) 20 MG tablet Take 1 tablet (20 mg total) by mouth daily. 01/07/21   Dennison Mascot, PA-C  clopidogrel (PLAVIX) 75 MG tablet Take 1 tablet (75 mg total) by mouth daily. 01/08/21   Regalado, Belkys A, MD  diclofenac sodium (VOLTAREN) 1 % GEL Apply 2-4 g topically 4 (four) times daily as needed (for knee pain).    [provider]  lisinopril-hydrochlorothiazide (PRINZIDE,ZESTORETIC) 20-12.5 MG per tablet Take 1 tablet by mouth 2 (two) times daily.    [provider]    Physical Exam: Vitals:   01/25/21 1854 01/25/21 1900 01/25/21 1915 01/25/21 2015  BP: 112/64 114/60 (!) 112/56 108/65  Pulse: 90 89 86 87  Resp: 17 (!) 24 17 15   Temp:      TempSrc:      SpO2: 96% 96% 97% 94%   Physical Exam Constitutional:      General: She is not in acute distress.    Appearance: Normal appearance.  HENT:     Head: Normocephalic and atraumatic.     Mouth/Throat:     Mouth: Mucous membranes are moist.     Pharynx: Oropharynx is clear.  Eyes:     Extraocular Movements: Extraocular movements intact.     Pupils: Pupils are equal, round, and reactive to light.  Cardiovascular:     Rate and Rhythm: Normal rate and regular rhythm.     Pulses: Normal pulses.     Heart sounds: Normal heart sounds.  Pulmonary:      Effort: Pulmonary effort is normal. No respiratory distress.     Breath sounds: Normal breath sounds.  Abdominal:     General: Bowel sounds are normal. There is no distension.     Palpations: Abdomen is soft.     Tenderness: There is no abdominal tenderness.  Musculoskeletal:        General: No swelling or deformity.  Skin:    General: Skin is warm and dry.  Neurological:     Comments: Mental Status: Patient is awake, alert, oriented x3 No signs of aphasia or neglect Cranial Nerves: II: Pupils round, and reactive to light.   III,IV, VI: EOMI without ptosis or diploplia.  V: Facial sensation is symmetric to light touch. VII: Facial  movement shows mild left facial droop. VIII: hearing is intact to voice X: Uvula elevates symmetrically XI: Shoulder shrug is symmetric. XII: tongue is midline without atrophy or fasciculations.  Motor: Good effort thorughout, at Least 5/5 RUE and RLE, 1/5 LUE, 1/5 LLE.  Sensory: Sensation is grossly intact bilateral UEs & LEs Cerebellar: Finger-Nose intact on the right, limited by strength on left      Labs on Admission: I have personally reviewed following labs and imaging studies  CBC: Recent Labs  Lab 01/25/21 1520  WBC 8.7  NEUTROABS 6.1  HGB 14.3  HCT 44.9  MCV 98.7  PLT 063    Basic Metabolic Panel: Recent Labs  Lab 01/25/21 1520  NA 134*  K 5.0  CL 101  CO2 20*  GLUCOSE 111*  BUN 59*  CREATININE 1.73*  CALCIUM 9.6    GFR: CrCl cannot be calculated (Unknown ideal weight.).  Liver Function Tests: Recent Labs  Lab 01/25/21 1520  AST 33  ALT 17  ALKPHOS 54  BILITOT 1.1  PROT 7.1  ALBUMIN 3.4*    Urine analysis:    Component Value Date/Time   COLORURINE YELLOW 01/06/2021 0103   APPEARANCEUR CLEAR 01/06/2021 0103   LABSPEC 1.023 01/06/2021 0103   PHURINE 5.5 01/06/2021 0103   GLUCOSEU NEGATIVE 01/06/2021 0103   HGBUR NEGATIVE 01/06/2021 0103   BILIRUBINUR NEGATIVE 01/06/2021 0103   KETONESUR TRACE (A)  01/06/2021 0103   PROTEINUR TRACE (A) 01/06/2021 0103   UROBILINOGEN 1.0 11/07/2009 2127   NITRITE NEGATIVE 01/06/2021 0103   LEUKOCYTESUR NEGATIVE 01/06/2021 0103    Radiological Exams on Admission: CT HEAD WO CONTRAST (5MM)  Result Date: 01/25/2021 CLINICAL DATA:  Left-sided weakness, facial droop EXAM: CT HEAD WITHOUT CONTRAST TECHNIQUE: Contiguous axial images were obtained from the base of the skull through the vertex without intravenous contrast. COMPARISON:  01/05/2021 FINDINGS: Brain: Age related volume loss. Extensive diffuse bilateral low-density throughout the deep white matter, likely chronic small vessel disease. No acute intracranial abnormality. Specifically, no hemorrhage, hydrocephalus, mass lesion, acute infarction, or significant intracranial injury. Vascular: No hyperdense vessel or unexpected calcification. Skull: No acute calvarial abnormality. Sinuses/Orbits: Mucosal thickening throughout the paranasal sinuses. No air-fluid levels. Other: None IMPRESSION: Extensive chronic small vessel disease. No acute intracranial abnormality. Electronically Signed   By: Rolm Baptise M.D.   On: 01/25/2021 17:11   MR BRAIN WO CONTRAST  Result Date: 01/25/2021 CLINICAL DATA:  Neurological deficit, acute, stroke suspected. Left-sided weakness. EXAM: MRI HEAD WITHOUT CONTRAST TECHNIQUE: Multiplanar, multiecho pulse sequences of the brain and surrounding structures were obtained without intravenous contrast. COMPARISON:  Head CT earlier same day. FINDINGS: Brain: Diffusion imaging shows acute infarction within the right hemisphere extensively affecting the subcortical white matter of the frontal and parietal regions, with a focal subcortical white matter infarction of the deep insula. This pattern suggests watershed infarction. Minimal chronic small-vessel ischemic changes affect the pons. No focal cerebellar finding. Cerebral hemispheres otherwise show chronic small-vessel ischemic changes of the  deep and subcortical white matter of both hemispheres. There is an old small right parietal cortical and subcortical infarction. No evidence of mass, hemorrhage, hydrocephalus or extra-axial collection. Vascular: Major vessels at the base of the brain show flow. Skull and upper cervical spine: Negative Sinuses/Orbits: Mucosal inflammatory changes of the paranasal sinuses. Orbits negative. Other: None IMPRESSION: Acute infarction affecting the deep and subcortical white matter of the right hemisphere, primarily in the frontal and parietal region with a punctate focus in the deep insular white matter.  This pattern/distribution suggests watershed infarction, though there does appear to be flow within the right internal carotid artery. Background pattern of chronic small vessel ischemic changes of the cerebral hemispheric white matter. Old small right parietal cortical infarction. Electronically Signed   By: Nelson Chimes M.D.   On: 01/25/2021 18:40   DG Chest Portable 1 View  Result Date: 01/25/2021 CLINICAL DATA:  Left-sided weakness and facial droop. EXAM: PORTABLE CHEST 1 VIEW COMPARISON:  November 07, 2009 FINDINGS: Very mild atelectasis is seen within the bilateral lung bases. There is no evidence of acute infiltrate, pleural effusion or pneumothorax. The heart size and mediastinal contours are within normal limits. The visualized skeletal structures are unremarkable. IMPRESSION: Very mild bibasilar atelectasis. Electronically Signed   By: Virgina Norfolk M.D.   On: 01/25/2021 17:56    EKG: Independently reviewed.  Normal sinus rhythm right bundle branch block, 89 bpm.  Similar to previous but rate is faster.  Assessment/Plan Principal Problem:   Acute CVA (cerebrovascular accident) [facility]) Active Problems:   Essential hypertension   AKI (acute kidney injury) ([place])  Acute CVA > History of prior diagnosed CVA last month with work-up done at that time.  Now coming in with recurrent left-sided symptoms  found to have new infarct appearing to be watershed in setting of lower blood pressures recently and apparent dehydration with AKI as below. > MRI showing no acute stroke in the deep and subcortical white matter with a pattern consistent of watershed infarct. > We will await neurology recommendations for any additional work-up/imaging as she has recently had general stroke work-up done last month. - Appreciate neurology recommendations - Allow for permissive HTN (systolic < 397 and diastolic < 673)  - Continue with IV fluids to support blood pressure - Continue daily aspirin, Plavix, statin - Hold off on echocardiogram, A1c, lipid panel given this was performed 1 month ago - MRA, carotid doppler - Tele monitoring  - SLP eval - PT/OT  AKI > Urinary to antihypertensive medication versus dehydration, though does not appear dehydrated and does not have recent history of decreased p.o. intake.  Creatinine elevated to 1.73 from baseline of around 1. > Received 1 L fluids in the ED - Continue with IV fluids - Trend renal function electrolytes - Avoid nephrotoxic agents  HTN > Lower blood pressure in the setting of AKI/dehydration - Hold antihypertensives in the setting of AKI, low normal blood pressures, acute CVA  DVT prophylaxis: Lovenox  Code Status:   Full  Family Communication:  Discussed with daughter at bedside Disposition Plan:   Patient is from:  Home  Anticipated DC to:  Pending clinical course  Anticipated DC date:  1 to 3 days  Anticipated DC barriers: None  Consults called:  Neurology, consulted by EDP.   Admission status:  Observation, telemetry   Severity of Illness: The appropriate patient status for this patient is OBSERVATION. Observation status is judged to be reasonable and necessary in order to provide the required intensity of service to ensure the patient's safety. The patient's presenting symptoms, physical exam findings, and initial radiographic and laboratory  data in the context of their medical condition is felt to place them at decreased risk for further clinical deterioration. Furthermore, it is anticipated that the patient will be medically stable for discharge from the hospital within 2 midnights of admission. The following factors support the patient status of observation.   " The patient's presenting symptoms include left-sided weakness left upper extremity and left lower extremity. "  The physical exam findings include  left upper extremity and left lower extremity weakness. " The initial radiographic and laboratory data are Lab work-up showed CMP with sodium 134, bicarb 20, BUN 59, creatinine elevated 1.73 from baseline of around 1, glucose 111, albumin 3.4.  CBC within normal notes.  PT and INR within normal limits.  Respiratory panel flu COVID-negative.  Ethanol level negative.  Urinalysis pending.  Chest x-ray showed very mild atelectasis, CT head showed no acute abnormality.  MR brain showed acute infarct of deep and subcortical white matter pattern consistent with watershed infarct also noted were old infarcts from previous stroke.   Marcelyn Bruins MD [facility]  How to contact the [facility] Attending or Consulting provider [place] or covering provider during after hours [place], for this patient?   Check the care team in [facility] and look for a) attending/consulting [facility] provider listed and b) the [facility] team listed Log into [email or web address] and use [place]'s universal password to access. If you do not have the password, please contact the hospital operator. Locate the [facility] provider you are looking for under [facility] and page to a number that you can be directly reached. If you still have difficulty reaching the provider, please page the [facility] (Director on Call) for the Hospitalists listed on amion for assistance.  01/25/2021, 8:49 PM

## 2021-01-25 NOTE — ED Triage Notes (Signed)
Pt arrived by [facility] from home. Was admitted on 9/20 for a stroke with left side weakness and facial droop. Per family, onset yesterday morning of increase in left arm weakness. Patient also reports mild headache.

## 2021-01-26 ENCOUNTER — Inpatient Hospital Stay (HOSPITAL_COMMUNITY): Payer: Medicare Other

## 2021-01-26 ENCOUNTER — Ambulatory Visit: Payer: Self-pay | Admitting: *Deleted

## 2021-01-26 DIAGNOSIS — R29709 NIHSS score 9: Secondary | ICD-10-CM | POA: Diagnosis not present

## 2021-01-26 DIAGNOSIS — R29810 Facial weakness: Secondary | ICD-10-CM | POA: Diagnosis present

## 2021-01-26 DIAGNOSIS — Z86718 Personal history of other venous thrombosis and embolism: Secondary | ICD-10-CM | POA: Diagnosis not present

## 2021-01-26 DIAGNOSIS — E669 Obesity, unspecified: Secondary | ICD-10-CM | POA: Diagnosis present

## 2021-01-26 DIAGNOSIS — Z7982 Long term (current) use of aspirin: Secondary | ICD-10-CM | POA: Diagnosis not present

## 2021-01-26 DIAGNOSIS — Z7902 Long term (current) use of antithrombotics/antiplatelets: Secondary | ICD-10-CM | POA: Diagnosis not present

## 2021-01-26 DIAGNOSIS — J9811 Atelectasis: Secondary | ICD-10-CM | POA: Diagnosis present

## 2021-01-26 DIAGNOSIS — Z880 Allergy status to penicillin: Secondary | ICD-10-CM | POA: Diagnosis not present

## 2021-01-26 DIAGNOSIS — I10 Essential (primary) hypertension: Secondary | ICD-10-CM | POA: Diagnosis present

## 2021-01-26 DIAGNOSIS — Z90710 Acquired absence of both cervix and uterus: Secondary | ICD-10-CM | POA: Diagnosis not present

## 2021-01-26 DIAGNOSIS — G8194 Hemiplegia, unspecified affecting left nondominant side: Secondary | ICD-10-CM | POA: Diagnosis present

## 2021-01-26 DIAGNOSIS — Z20822 Contact with and (suspected) exposure to covid-19: Secondary | ICD-10-CM | POA: Diagnosis present

## 2021-01-26 DIAGNOSIS — F17210 Nicotine dependence, cigarettes, uncomplicated: Secondary | ICD-10-CM | POA: Diagnosis present

## 2021-01-26 DIAGNOSIS — Z6831 Body mass index (BMI) 31.0-31.9, adult: Secondary | ICD-10-CM | POA: Diagnosis not present

## 2021-01-26 DIAGNOSIS — N179 Acute kidney failure, unspecified: Secondary | ICD-10-CM | POA: Diagnosis present

## 2021-01-26 DIAGNOSIS — E860 Dehydration: Secondary | ICD-10-CM | POA: Diagnosis present

## 2021-01-26 DIAGNOSIS — E861 Hypovolemia: Secondary | ICD-10-CM | POA: Diagnosis present

## 2021-01-26 DIAGNOSIS — I639 Cerebral infarction, unspecified: Secondary | ICD-10-CM

## 2021-01-26 DIAGNOSIS — Z885 Allergy status to narcotic agent status: Secondary | ICD-10-CM | POA: Diagnosis not present

## 2021-01-26 DIAGNOSIS — R29708 NIHSS score 8: Secondary | ICD-10-CM | POA: Diagnosis not present

## 2021-01-26 DIAGNOSIS — Z96641 Presence of right artificial hip joint: Secondary | ICD-10-CM | POA: Diagnosis present

## 2021-01-26 DIAGNOSIS — M1990 Unspecified osteoarthritis, unspecified site: Secondary | ICD-10-CM | POA: Diagnosis present

## 2021-01-26 DIAGNOSIS — R29711 NIHSS score 11: Secondary | ICD-10-CM | POA: Diagnosis not present

## 2021-01-26 DIAGNOSIS — I63511 Cerebral infarction due to unspecified occlusion or stenosis of right middle cerebral artery: Secondary | ICD-10-CM | POA: Diagnosis present

## 2021-01-26 DIAGNOSIS — R29710 NIHSS score 10: Secondary | ICD-10-CM | POA: Diagnosis present

## 2021-01-26 LAB — BASIC METABOLIC PANEL
Anion gap: 13 (ref 5–15)
BUN: 59 mg/dL — ABNORMAL HIGH (ref 8–23)
CO2: 19 mmol/L — ABNORMAL LOW (ref 22–32)
Calcium: 9.3 mg/dL (ref 8.9–10.3)
Chloride: 103 mmol/L (ref 98–111)
Creatinine, Ser: 1.64 mg/dL — ABNORMAL HIGH (ref 0.44–1.00)
GFR, Estimated: 30 mL/min — ABNORMAL LOW (ref >60)
Glucose, Bld: 115 mg/dL — ABNORMAL HIGH (ref 70–99)
Potassium: 4.2 mmol/L (ref 3.5–5.1)
Sodium: 135 mmol/L (ref 135–145)

## 2021-01-26 MED ORDER — ASPIRIN EC 81 MG PO TBEC
81.0000 mg | DELAYED_RELEASE_TABLET | Freq: Every day | ORAL | Status: DC
  Administered 2021-01-27 – 2021-01-28 (×2): 81 mg via ORAL
  Filled 2021-01-26 (×2): qty 1

## 2021-01-26 MED ORDER — TICAGRELOR 90 MG PO TABS
90.0000 mg | ORAL_TABLET | Freq: Two times a day (BID) | ORAL | Status: DC
  Administered 2021-01-26 – 2021-01-28 (×5): 90 mg via ORAL
  Filled 2021-01-26 (×5): qty 1

## 2021-01-26 MED ORDER — POLYETHYLENE GLYCOL 3350 17 G PO PACK
17.0000 g | PACK | Freq: Every day | ORAL | Status: DC
  Administered 2021-01-26 – 2021-01-28 (×2): 17 g via ORAL
  Filled 2021-01-26 (×3): qty 1

## 2021-01-26 NOTE — Plan of Care (Signed)
  Problem: Education: Goal: Knowledge of disease or condition will improve 01/26/2021 1757 by Drucie Ip I, RN Outcome: Progressing 01/26/2021 1757 by Drucie Ip I, RN Outcome: Progressing Goal: Knowledge of secondary prevention will improve 01/26/2021 1757 by Drucie Ip I, RN Outcome: Progressing 01/26/2021 1757 by Drucie Ip I, RN Outcome: Progressing

## 2021-01-26 NOTE — Progress Notes (Addendum)
PROGRESS NOTE  [name]    DOB: Dec 09, 1928, 87 y.o.  WUJ:[id number]  PCP: Nolene Ebbs, MD   Code Status: Full Code   DOA: 01/25/2021   LOS: 0  Brief Narrative of Current Hospitalization  [name] is a 87 y.o. female with a PMH significant for CVA, HTN, DVT, DDD, syncope. They presented from home where she lives with her daughter to the ED on 01/25/2021 with stroke symptoms including left-sided weakness x 1 day. In the ED, it was found that they had acute CVA. They were treated with diagnostic work-up and medical management as she was outside of the interventional window.  Patient was admitted to medicine service for further workup and management of CVA as outlined in detail below.  01/26/21 -patient reports continued paralysis of right sided limbs but no pain or complaints.  Assessment & Plan  Principal Problem:   Acute CVA (cerebrovascular accident) [facility]) Active Problems:   Essential hypertension   AKI (acute kidney injury) ([place])  Acute CVA with residual left-sided weakness.  Lipid panel normal with LDL 53.  hgb A1c 5.7 which is well below goal for patient's age.  Patient's daughter is hopeful for rehab after discharge as she will have difficulty with transporting patient with new left-sided paralysis. - neurology following, appreciate their recommendations - Allow for permissive HTN (systolic < 956 and diastolic < 213)  - Continue with IV fluids to support blood pressure - Continue daily aspirin, Plavix, statin - Hold off on echocardiogram, A1c, lipid panel given this was performed 1 month ago - Tele monitoring  - SLP eval - PT/OT -Carotid Dopplers are pending   AKI- improving Cr. 1.73>1.64 - Continue with IV fluids - Trend renal function electrolytes - Avoid nephrotoxic agents   HTN - Hold antihypertensives in the setting of AKI, low normal blood pressures, acute CVA  DVT prophylaxis: enoxaparin (LOVENOX) injection 30 mg Start: 01/25/21 2100  Diet:   Diet Orders (From admission, onward)     Start     Ordered   01/25/21 2140  Diet Heart Room service appropriate? Yes; Fluid consistency: Thin  Diet effective now       Question Answer Comment  Room service appropriate? Yes   Fluid consistency: Thin      01/25/21 2139            Subjective 01/26/21    Pt reports continued weakness on left side of her body.  She denies any pain.  Disposition Plan & Communication  Status is: Observation  The patient will require care spanning > 2 midnights and should be moved to inpatient because: Ongoing diagnostic testing needed not appropriate for outpatient work up  Dispo: The patient is from: Home              Anticipated d/c is to: SNF              Patient currently is not medically stable to d/c.   Difficult to place patient No   Family Communication: Daughter at bedside  Consults, Procedures, Significant Events  Consultants:  Neurology  Procedures/significant events:  Carotid Dopplers  Antimicrobials:  Anti-infectives (From admission, onward)    None        Objective   Vitals:   01/26/21 0045 01/26/21 0225 01/26/21 0400 01/26/21 0600  BP: (!) 96/55 (!) 100/52 (!) 102/51 (!) 97/52  Pulse: 82 80 81 76  Resp: (!) 29 (!) 34 (!) 22 (!) 21  Temp:      TempSrc:  SpO2: 97% 96% 97% 96%   No intake or output data in the 24 hours ending 01/26/21 0728 There were no vitals filed for this visit.  Patient BMI: There is no height or weight on file to calculate BMI.   Physical Exam: General: awake, alert, NAD HEENT: atraumatic, clear conjunctiva, anicteric sclera, moist mucus membranes, hearing grossly normal Respiratory: CTAB, no wheezes, rales or rhonchi, normal respiratory effort. Cardiovascular: quick capillary refill  Gastrointestinal: soft, NT, ND, no HSM felt Nervous: A&O x3. no gross focal neurologic deficits, normal speech Extremities: moves all right-sided arm and leg like normal.  Strength 1 out of 5 on left.   Sensation intact Skin: dry, intact, normal temperature, normal color, No rashes, lesions or ulcers Psychiatry: normal mood, congruent affect  Labs   I have personally reviewed following labs and imaging studies Admission on 01/25/2021  Component Date Value Ref Range Status   SARS Coronavirus 2 by RT PCR 01/25/2021 NEGATIVE  NEGATIVE Final   Influenza A by PCR 01/25/2021 NEGATIVE  NEGATIVE Final   Influenza B by PCR 01/25/2021 NEGATIVE  NEGATIVE Final   Alcohol, Ethyl (B) 01/25/2021 <10  <10 mg/dL Final   Prothrombin Time 01/25/2021 14.6  11.4 - 15.2 seconds Final   INR 01/25/2021 1.1  0.8 - 1.2 Final   aPTT 01/25/2021 29  24 - 36 seconds Final   Sodium 01/25/2021 134 (A) 135 - 145 mmol/L Final   Potassium 01/25/2021 5.0  3.5 - 5.1 mmol/L Final   Chloride 01/25/2021 101  98 - 111 mmol/L Final   CO2 01/25/2021 20 (A) 22 - 32 mmol/L Final   Glucose, Bld 01/25/2021 111 (A) 70 - 99 mg/dL Final   BUN 01/25/2021 59 (A) 8 - 23 mg/dL Final   Creatinine, Ser 01/25/2021 1.73 (A) 0.44 - 1.00 mg/dL Final   Calcium 01/25/2021 9.6  8.9 - 10.3 mg/dL Final   Total Protein 01/25/2021 7.1  6.5 - 8.1 g/dL Final   Albumin 01/25/2021 3.4 (A) 3.5 - 5.0 g/dL Final   AST 01/25/2021 33  15 - 41 U/L Final   ALT 01/25/2021 17  0 - 44 U/L Final   Alkaline Phosphatase 01/25/2021 54  38 - 126 U/L Final   Total Bilirubin 01/25/2021 1.1  0.3 - 1.2 mg/dL Final   GFR, Estimated 01/25/2021 28 (A) >60 mL/min Final   Anion gap 01/25/2021 13  5 - 15 Final   Opiates 01/25/2021 NONE DETECTED  NONE DETECTED Final   Cocaine 01/25/2021 NONE DETECTED  NONE DETECTED Final   Benzodiazepines 01/25/2021 NONE DETECTED  NONE DETECTED Final   Amphetamines 01/25/2021 NONE DETECTED  NONE DETECTED Final   Tetrahydrocannabinol 01/25/2021 NONE DETECTED  NONE DETECTED Final   Barbiturates 01/25/2021 NONE DETECTED  NONE DETECTED Final   Color, Urine 01/25/2021 YELLOW  YELLOW Final   APPearance 01/25/2021 HAZY (A) CLEAR Final   Specific  Gravity, Urine 01/25/2021 1.016  1.005 - 1.030 Final   pH 01/25/2021 5.0  5.0 - 8.0 Final   Glucose, UA 01/25/2021 NEGATIVE  NEGATIVE mg/dL Final   Hgb urine dipstick 01/25/2021 NEGATIVE  NEGATIVE Final   Bilirubin Urine 01/25/2021 NEGATIVE  NEGATIVE Final   Ketones, ur 01/25/2021 NEGATIVE  NEGATIVE mg/dL Final   Protein, ur 01/25/2021 NEGATIVE  NEGATIVE mg/dL Final   Nitrite 01/25/2021 NEGATIVE  NEGATIVE Final   Leukocytes,Ua 01/25/2021 NEGATIVE  NEGATIVE Final   WBC 01/25/2021 8.7  4.0 - 10.5 K/uL Final   RBC 01/25/2021 4.55  3.87 - 5.11 MIL/uL Final  Hemoglobin 01/25/2021 14.3  12.0 - 15.0 g/dL Final   HCT 01/25/2021 44.9  36.0 - 46.0 % Final   MCV 01/25/2021 98.7  80.0 - 100.0 fL Final   MCH 01/25/2021 31.4  26.0 - 34.0 pg Final   MCHC 01/25/2021 31.8  30.0 - 36.0 g/dL Final   RDW 01/25/2021 13.3  11.5 - 15.5 % Final   Platelets 01/25/2021 194  150 - 400 K/uL Final   nRBC 01/25/2021 0.0  0.0 - 0.2 % Final   Neutrophils Relative % 01/25/2021 69  % Final   Neutro Abs 01/25/2021 6.1  1.7 - 7.7 K/uL Final   Lymphocytes Relative 01/25/2021 18  % Final   Lymphs Abs 01/25/2021 1.6  0.7 - 4.0 K/uL Final   Monocytes Relative 01/25/2021 8  % Final   Monocytes Absolute 01/25/2021 0.7  0.1 - 1.0 K/uL Final   Eosinophils Relative 01/25/2021 3  % Final   Eosinophils Absolute 01/25/2021 0.3  0.0 - 0.5 K/uL Final   Basophils Relative 01/25/2021 1  % Final   Basophils Absolute 01/25/2021 0.0  0.0 - 0.1 K/uL Final   Immature Granulocytes 01/25/2021 1  % Final   Abs Immature Granulocytes 01/25/2021 0.06  0.00 - 0.07 K/uL Final   Sodium 01/26/2021 135  135 - 145 mmol/L Final   Potassium 01/26/2021 4.2  3.5 - 5.1 mmol/L Final   Chloride 01/26/2021 103  98 - 111 mmol/L Final   CO2 01/26/2021 19 (A) 22 - 32 mmol/L Final   Glucose, Bld 01/26/2021 115 (A) 70 - 99 mg/dL Final   BUN 01/26/2021 59 (A) 8 - 23 mg/dL Final   Creatinine, Ser 01/26/2021 1.64 (A) 0.44 - 1.00 mg/dL Final   Calcium 01/26/2021  9.3  8.9 - 10.3 mg/dL Final   GFR, Estimated 01/26/2021 30 (A) >60 mL/min Final   Anion gap 01/26/2021 13  5 - 15 Final    Imaging Studies  CT HEAD WO CONTRAST (5MM)  Result Date: 01/25/2021 CLINICAL DATA:  Left-sided weakness, facial droop EXAM: CT HEAD WITHOUT CONTRAST TECHNIQUE: Contiguous axial images were obtained from the base of the skull through the vertex without intravenous contrast. COMPARISON:  01/05/2021 FINDINGS: Brain: Age related volume loss. Extensive diffuse bilateral low-density throughout the deep white matter, likely chronic small vessel disease. No acute intracranial abnormality. Specifically, no hemorrhage, hydrocephalus, mass lesion, acute infarction, or significant intracranial injury. Vascular: No hyperdense vessel or unexpected calcification. Skull: No acute calvarial abnormality. Sinuses/Orbits: Mucosal thickening throughout the paranasal sinuses. No air-fluid levels. Other: None IMPRESSION: Extensive chronic small vessel disease. No acute intracranial abnormality. Electronically Signed   By: Rolm Baptise M.D.   On: 01/25/2021 17:11   MR ANGIO HEAD WO CONTRAST  Result Date: 01/25/2021 CLINICAL DATA:  Syncope and left-sided weakness EXAM: MRA HEAD WITHOUT CONTRAST TECHNIQUE: Angiographic images of the Circle of Willis were acquired using MRA technique without intravenous contrast. COMPARISON:  No pertinent prior exam. FINDINGS: POSTERIOR CIRCULATION: --Vertebral arteries: Normal --Inferior cerebellar arteries: Normal. --Basilar artery: Normal. --Superior cerebellar arteries: Normal. --Posterior cerebral arteries: Normal. ANTERIOR CIRCULATION: --Intracranial internal carotid arteries: Normal. --Anterior cerebral arteries (ACA): Normal. --Middle cerebral arteries (MCA): There is a short segment area of signal loss within the distal M1 segment of the right MCA. Otherwise normal. ANATOMIC VARIANTS: None IMPRESSION: 1. Short segment area of signal loss in the distal M1 segment of  the right MCA, possibly a short segment occlusion. 2. Otherwise normal intracranial MRA. Electronically Signed   By: Cletus Gash.D.  On: 01/25/2021 23:32   MR BRAIN WO CONTRAST  Result Date: 01/25/2021 CLINICAL DATA:  Neurological deficit, acute, stroke suspected. Left-sided weakness. EXAM: MRI HEAD WITHOUT CONTRAST TECHNIQUE: Multiplanar, multiecho pulse sequences of the brain and surrounding structures were obtained without intravenous contrast. COMPARISON:  Head CT earlier same day. FINDINGS: Brain: Diffusion imaging shows acute infarction within the right hemisphere extensively affecting the subcortical white matter of the frontal and parietal regions, with a focal subcortical white matter infarction of the deep insula. This pattern suggests watershed infarction. Minimal chronic small-vessel ischemic changes affect the pons. No focal cerebellar finding. Cerebral hemispheres otherwise show chronic small-vessel ischemic changes of the deep and subcortical white matter of both hemispheres. There is an old small right parietal cortical and subcortical infarction. No evidence of mass, hemorrhage, hydrocephalus or extra-axial collection. Vascular: Major vessels at the base of the brain show flow. Skull and upper cervical spine: Negative Sinuses/Orbits: Mucosal inflammatory changes of the paranasal sinuses. Orbits negative. Other: None IMPRESSION: Acute infarction affecting the deep and subcortical white matter of the right hemisphere, primarily in the frontal and parietal region with a punctate focus in the deep insular white matter. This pattern/distribution suggests watershed infarction, though there does appear to be flow within the right internal carotid artery. Background pattern of chronic small vessel ischemic changes of the cerebral hemispheric white matter. Old small right parietal cortical infarction. Electronically Signed   By: Nelson Chimes M.D.   On: 01/25/2021 18:40   DG Chest Portable 1  View  Result Date: 01/25/2021 CLINICAL DATA:  Left-sided weakness and facial droop. EXAM: PORTABLE CHEST 1 VIEW COMPARISON:  November 07, 2009 FINDINGS: Very mild atelectasis is seen within the bilateral lung bases. There is no evidence of acute infiltrate, pleural effusion or pneumothorax. The heart size and mediastinal contours are within normal limits. The visualized skeletal structures are unremarkable. IMPRESSION: Very mild bibasilar atelectasis. Electronically Signed   By: Virgina Norfolk M.D.   On: 01/25/2021 17:56   Medications   Scheduled Meds:  aspirin  325 mg Oral Daily   atorvastatin  20 mg Oral Daily   clopidogrel  75 mg Oral Daily   enoxaparin (LOVENOX) injection  30 mg Subcutaneous Q24H     LOS: 0 days   Time spent: >13min  Richarda Osmond, DO [facility] 01/26/2021, 7:28 AM   To contact the [facility] Attending or Consulting provider for this patient: Check the care team in [facility] for a) attending/consulting [facility] provider listed and b) the [facility] team listed Log into [email or web address] and use [place]'s universal password to access. If you do not have the password, please contact the hospital operator. Locate the [facility] provider you are looking for under [facility] and page to a number that you can be directly reached. If you still have difficulty reaching the provider, please page the [facility] (Director on Call) for the Hospitalists listed on amion for assistance.

## 2021-01-26 NOTE — Plan of Care (Signed)
  Problem: Education: Goal: Knowledge of disease or condition will improve Outcome: Progressing Goal: Knowledge of secondary prevention will improve Outcome: Progressing   

## 2021-01-26 NOTE — ED Notes (Signed)
Attempted to call report and RN was unavailable at this time.

## 2021-01-26 NOTE — ED Notes (Signed)
Patient transported to Ultrasound 

## 2021-01-26 NOTE — Evaluation (Signed)
Occupational Therapy Evaluation Patient Details Name: [name] MRN: [id number] DOB: May 10, 1928 Today's Date: 01/26/2021   History of Present Illness 87 y.o.  female presents to ED presents with worsening left-sided weakness and found to have expansion of right MCA territory infarct (primarily frontal and parietal with punctate focus deep insular white matter). PMH significant for hypertension, arthritis, R THA, IVC filter, ulnar n transposition, current smoker, cervical degenerative disc with C3-C4 large central disc herniation, hx of bilateral DVTs, recent scattered right MCA territory infarct.   Clinical Impression   PTA pt living at home with her daughter and had started outpt OT after her last stroke. Pt demonstrates a significant functional decline since last evaluation, requiring Max A +2 for attempts at mobility and Max to total A with ADL tasks. Pt demonstrates L inattention in addition to deficits listed below. Recommend rehab at SNF. Will follow acutely       Recommendations for follow up therapy are one component of a multi-disciplinary discharge planning process, led by the attending physician.  Recommendations may be updated based on patient status, additional functional criteria and insurance authorization.   Follow Up Recommendations  SNF    Equipment Recommendations  None recommended by OT    Recommendations for Other Services       Precautions / Restrictions Precautions Precautions: Fall Required Braces or Orthoses:  (monitor for need for L resting hand splint)      Mobility Bed Mobility Overal bed mobility: Needs Assistance Bed Mobility: Supine to Sit;Sit to Supine     Supine to sit: Max assist;+2 for physical assistance Sit to supine: Max assist;+2 for physical assistance   General bed mobility comments: HoB elevated    Transfers Overall transfer level: Needs assistance   Transfers: Sit to/from Stand Sit to Stand: Max assist          General transfer comment: unable to achieve full upright standing    Balance     Sitting balance-Leahy Scale: Poor (posterior)       Standing balance-Leahy Scale: Zero                             ADL either performed or assessed with clinical judgement   ADL Overall ADL's : Needs assistance/impaired Eating/Feeding: Minimal assistance   Grooming: Moderate assistance   Upper Body Bathing: Moderate assistance   Lower Body Bathing: Maximal assistance;Bed level   Upper Body Dressing : Maximal assistance   Lower Body Dressing: Total assistance         Toileting - Clothing Manipulation Details (indicate cue type and reason): purewick; unsure if incontinenet at baseline; pt states she was going to the bathroom at home with assistance of her daughter     Functional mobility during ADLs: +2 for physical assistance;Maximal assistance       Vision Baseline Vision/History: 1 Wears glasses Vision Assessment?: Yes Eye Alignment: Within Functional Limits Ocular Range of Motion: Impaired-to be further tested in functional context Alignment/Gaze Preference:  (R preference`) Tracking/Visual Pursuits: Decreased smoothness of horizontal tracking;Decreased smoothness of vertical tracking Saccades: Additional eye shifts occurred during testing;Additional head turns occurred during testing Visual Fields: Left visual field deficit     Perception Perception Perception Tested?: Yes Perception Deficits: Inattention/neglect   Praxis      Pertinent Vitals/Pain Pain Assessment: Faces Faces Pain Scale: Hurts a little bit Pain Location: headache Pain Descriptors / Indicators: Headache Pain Intervention(s): Limited activity within patient's tolerance;Monitored during session;Repositioned  Hand Dominance Right   Extremity/Trunk Assessment Upper Extremity Assessment Upper Extremity Assessment: LUE deficits/detail LUE Deficits / Details: flaccid with flexor synergy  pattern developing in elbow flexion; minimal active grip; unable to isolate movements; decreased awareness of L side LUE Coordination: decreased fine motor;decreased gross motor (non-functional)   Lower Extremity Assessment Lower Extremity Assessment: Defer to PT evaluation   Cervical / Trunk Assessment Cervical / Trunk Assessment: Kyphotic (posterior bias)   Communication Communication Communication: No difficulties   Cognition Arousal/Alertness: Awake/alert Behavior During Therapy: WFL for tasks assessed/performed Overall Cognitive Status: Impaired/Different from baseline Area of Impairment: Attention;Memory;Safety/judgement;Awareness;Problem solving                   Current Attention Level: Sustained Memory: Decreased short-term memory;Decreased recall of precautions   Safety/Judgement: Decreased awareness of deficits;Decreased awareness of safety Awareness: Emergent Problem Solving: Slow processing;Decreased initiation General Comments: unaware of falling posteriorly; unable to state if deficits are worse than previous stroke   General Comments  bruising R forearm    Exercises     Shoulder Instructions      Home Living Family/patient expects to be discharged to:: Private residence Living Arrangements: Children Available Help at Discharge: Family;Available 24 hours/day Type of Home: House Home Access: Stairs to enter [employer] of Steps: 1 Entrance Stairs-Rails: None Home Layout: One level     Bathroom Shower/Tub: Teacher, early years/pre: Handicapped height Bathroom Accessibility: Yes How Accessible: Accessible via walker Home Equipment: [place] - 2 wheels;Cane - single point;Bedside commode;Toilet riser;Tub bench;Wheelchair - manual;Hospital bed          Prior Functioning/Environment Level of Independence: Needs assistance  Gait / Transfers Assistance Needed: Was using cane for ambulation and was working with outpatient  PT. ADL's / Homemaking Assistance Needed: Daughter was helping with ADLs.   Comments: Using [facility] transportation to get to outpatient appointments.        OT Problem List: Decreased strength;Decreased range of motion;Decreased activity tolerance;Impaired balance (sitting and/or standing);Impaired vision/perception;Decreased coordination;Decreased cognition;Decreased safety awareness;Decreased knowledge of use of DME or AE;Decreased knowledge of precautions;Impaired sensation;Impaired tone;Obesity;Impaired UE functional use      OT Treatment/Interventions: Self-care/ADL training;Therapeutic exercise;Neuromuscular education;DME and/or AE instruction;Therapeutic activities;Splinting;Cognitive remediation/compensation;Visual/perceptual remediation/compensation;Patient/family education;Balance training    OT Goals(Current goals can be found in the care plan section) Acute Rehab OT Goals Patient Stated Goal: to go to rehab OT Goal Formulation: With patient/family Time For Goal Achievement: 02/09/21 Potential to Achieve Goals: Good  OT Frequency: Min 2X/week   Barriers to D/C:            Co-evaluation PT/OT/SLP Co-Evaluation/Treatment: Yes Reason for Co-Treatment: Complexity of the patient's impairments (multi-system involvement);For patient/therapist safety;To address functional/ADL transfers   OT goals addressed during session: ADL's and self-care      AM-PAC OT "6 Clicks" Daily Activity     Outcome Measure Help from another person eating meals?: A Little Help from another person taking care of personal grooming?: A Lot Help from another person toileting, which includes using toliet, bedpan, or urinal?: Total Help from another person bathing (including washing, rinsing, drying)?: A Lot Help from another person to put on and taking off regular upper body clothing?: A Lot Help from another person to put on and taking off regular lower body clothing?: Total 6 Click Score: 11    End of Session Equipment Utilized During Treatment: Gait belt Nurse Communication: Mobility status  Activity Tolerance: Patient tolerated treatment well Patient left: in bed;with call bell/phone within reach;with family/visitor  present  OT Visit Diagnosis: Unsteadiness on feet (R26.81);Other abnormalities of gait and mobility (R26.89);Muscle weakness (generalized) (M62.81);Low vision, both eyes (H54.2);Other symptoms and signs involving cognitive function;Hemiplegia and hemiparesis Hemiplegia - Right/Left: Left Hemiplegia - dominant/non-dominant: Non-Dominant Hemiplegia - caused by: Cerebral infarction                Time: 3234-6887 OT Time Calculation (min): 25 min Charges:  OT General Charges $OT Visit: 1 Visit OT Evaluation $OT Eval Moderate Complexity: [place], OT/L   Acute OT Clinical Specialist [place] Pager [phone] Office [phone]   [facility] 01/26/2021, 1:26 PM

## 2021-01-26 NOTE — Progress Notes (Signed)
PT evaluation note   01/26/21 1407  PT Visit Information  Last PT Received On 01/26/21  Assistance Needed +2  PT/OT/SLP Co-Evaluation/Treatment Yes  Reason for Co-Treatment Complexity of the patient's impairments (multi-system involvement);Necessary to address cognition/behavior during functional activity;For patient/therapist safety  PT goals addressed during session Balance;Mobility/safety with mobility  History of Present Illness 87 y.o.  female presents to ED presents with worsening left-sided weakness and found to have expansion of right MCA territory infarct (primarily frontal and parietal with punctate focus deep insular white matter). PMH significant for hypertension, arthritis, R THA, IVC filter, ulnar n transposition, current smoker, cervical degenerative disc with C3-C4 large central disc herniation, hx of bilateral DVTs, recent scattered right MCA territory infarct.  Precautions  Precautions Fall  Restrictions  Weight Bearing Restrictions No  Home Living  Family/patient expects to be discharged to: Private residence  Living Arrangements Children  Available Help at Discharge Family;Available 24 hours/day  Type of Home House  Home Access Stairs to enter  Entrance Stairs-Number of Steps 1  Entrance Stairs-Rails None  Home Layout One level  Bathroom Shower/Tub Tub/shower unit  Bathroom Toilet Handicapped height  Bathroom Accessibility Yes  Home Equipment Walker - 2 wheels;Cane - single point;BSC;Toilet riser;Tub bench;Wheelchair - manual;Hospital bed  Prior Function  Level of Independence Needs assistance  Gait / Transfers Assistance Needed Was using cane for ambulation and was working with outpatient PT.  ADL's / Homemaking Assistance Needed Daughter was helping with ADLs.  Comments Using [facility] transportation to get to outpatient appointments.  Communication  Communication No difficulties  Pain Assessment  Pain Assessment Faces  Faces Pain Scale 2  Pain Location  headache  Pain Descriptors / Indicators Headache  Pain Intervention(s) Monitored during session;Limited activity within patient's tolerance;Repositioned  Cognition  Arousal/Alertness Awake/alert  Behavior During Therapy WFL for tasks assessed/performed  Overall Cognitive Status Impaired/Different from baseline  Area of Impairment Attention;Memory;Safety/judgement;Awareness;Problem solving  Current Attention Level Sustained  Memory Decreased short-term memory;Decreased recall of precautions  Safety/Judgement Decreased awareness of deficits;Decreased awareness of safety  Awareness Emergent  Problem Solving Slow processing;Decreased initiation  General Comments unaware of falling posteriorly; unable to state if deficits are worse than previous stroke  Upper Extremity Assessment  Upper Extremity Assessment Defer to OT evaluation  Lower Extremity Assessment  Lower Extremity Assessment LLE deficits/detail  LLE Deficits / Details Grossly 1/5 in hip flexors, knee flexors, and ankle musculature. Pt reports sensation WFL.  Cervical / Trunk Assessment  Cervical / Trunk Assessment Kyphotic (posterior bias)  Bed Mobility  Overal bed mobility Needs Assistance  Bed Mobility Supine to Sit;Sit to Supine  Supine to sit Max assist;+2 for physical assistance  Sit to supine Max assist;+2 for physical assistance  General bed mobility comments HoB elevated  Transfers  Overall transfer level Needs assistance  Equipment used None  Transfers Sit to/from Stand  Sit to Stand Max assist;+2 physical assistance  General transfer comment unable to achieve full upright standing. Required manual blocking of LLE to attempt standing X2.  Balance  Overall balance assessment Needs assistance  Sitting-balance support Single extremity supported  Sitting balance-Leahy Scale Poor (posterior lean)  Sitting balance - Comments Reliant on min up to mod A for sitting balance.  Postural control Posterior lean  Standing  balance support Single extremity supported  Standing balance-Leahy Scale Zero  General Comments  General comments (skin integrity, edema, etc.) bruising R forearm  PT - End of Session  Equipment Utilized During Treatment Gait belt  Activity Tolerance Patient  tolerated treatment well  Patient left in bed;with call bell/phone within reach;with family/visitor present (on stretcher in ED)  Nurse Communication Mobility status  PT Assessment  PT Recommendation/Assessment Patient needs continued PT services  PT Visit Diagnosis Hemiplegia and hemiparesis;Other symptoms and signs involving the nervous system (R29.898);Difficulty in walking, not elsewhere classified (R26.2)  Hemiplegia - Right/Left Left  Hemiplegia - dominant/non-dominant Non-dominant  Hemiplegia - caused by Cerebral infarction  PT Problem List Decreased coordination;Impaired sensation;Decreased strength;Decreased knowledge of use of DME;Decreased balance;Decreased mobility  PT Plan  PT Frequency (ACUTE ONLY) Min 3X/week  PT Treatment/Interventions (ACUTE ONLY) DME instruction;Gait training;Functional mobility training;Therapeutic activities;Therapeutic exercise;Balance training;Neuromuscular re-education;Cognitive remediation;Patient/family education  AM-PAC PT "6 Clicks" Mobility Outcome Measure (Version 2)  Help needed turning from your back to your side while in a flat bed without using bedrails? 1  Help needed moving from lying on your back to sitting on the side of a flat bed without using bedrails? 1  Help needed moving to and from a bed to a chair (including a wheelchair)? 1  Help needed standing up from a chair using your arms (e.g., wheelchair or bedside chair)? 1  Help needed to walk in hospital room? 1  Help needed climbing 3-5 steps with a railing?  1  6 Click Score 6  Consider Recommendation of Discharge To: CIR/SNF/LTACH  Progressive Mobility  What is the highest level of mobility based on the progressive mobility  assessment? Level 1 (Bedfast) - Unable to balance while sitting on edge of bed  Mobility Sit up in bed/chair position for meals  PT Recommendation  Follow Up Recommendations SNF  PT equipment None recommended by PT  Individuals Consulted  Consulted and Agree with Results and Recommendations Patient;Family member/caregiver  Family Member Consulted daughter  Acute Rehab PT Goals  Patient Stated Goal to go to rehab  PT Goal Formulation With patient/family  Time For Goal Achievement 02/09/21  Potential to Achieve Goals Fair  PT Time Calculation  PT Start Time (ACUTE ONLY) 1118  PT Stop Time (ACUTE ONLY) 1143  PT Time Calculation (min) (ACUTE ONLY) 25 min  PT General Charges  $$ ACUTE PT VISIT 1 Visit  PT Evaluation  $PT Eval Moderate Complexity 1 Mod  Written Expression  Dominant Hand Right   Pt admitted secondary to problem above with deficits above. Pt with significant weakness in LUE/LLE this session. Required max A +2 for all mobility tasks this session. Attempted to stand X2, but unable to achieve full upright. Recommending SNF level therapies at d/c as pt has had significant decline in function. Will continue to follow acutely.   Reuel Derby, PT, DPT  Acute Rehabilitation Services  Pager: [phone] Office: [phone]

## 2021-01-26 NOTE — Progress Notes (Signed)
Carotid duplex has been completed.   Results can be found under chart review under CV PROC. 01/26/2021 12:44 PM Rikki Smestad RVT, RDMS

## 2021-01-26 NOTE — Progress Notes (Addendum)
STROKE TEAM PROGRESS NOTE   INTERVAL HISTORY [name], is at the bedside.  She is well known to our service with recent hospitalization for right MCA territory infarct with high grade M1 stenosis.  Patient medication changed from aspriin 325mg  ( which she was not taking )and plavix 75mg  to asa 81mg  and brilinta 90mg  bid today. Patient's daughter reported using aspirin prn and not daily.  MRI scan of the brain shows a large right subcortical infarct with extension of the previous 1 and MRI still shows high-grade right M1 stenosis. Vitals:   01/26/21 0752 01/26/21 0753 01/26/21 0759 01/26/21 1000  BP:   (!) 99/50 (!) 114/53  Pulse: 75 75 84 79  Resp: (!) 30 (!) 22 18 16   Temp:      TempSrc:      SpO2: 97%  100% 99%   CBC:  Recent Labs  Lab 01/25/21 1520  WBC 8.7  NEUTROABS 6.1  HGB 14.3  HCT 44.9  MCV 98.7  PLT 269   Basic Metabolic Panel:  Recent Labs  Lab 01/25/21 1520 01/26/21 0351  NA 134* 135  K 5.0 4.2  CL 101 103  CO2 20* 19*  GLUCOSE 111* 115*  BUN 59* 59*  CREATININE 1.73* 1.64*  CALCIUM 9.6 9.3    Lipid Panel: No results for input(s): CHOL, TRIG, HDL, CHOLHDL, VLDL, LDLCALC in the last 168 hours.  HgbA1c: No results for input(s): HGBA1C in the last 168 hours. Urine Drug Screen:  Recent Labs  Lab 01/25/21 2024  LABOPIA NONE DETECTED  COCAINSCRNUR NONE DETECTED  LABBENZ NONE DETECTED  AMPHETMU NONE DETECTED  THCU NONE DETECTED  LABBARB NONE DETECTED    Alcohol Level  Recent Labs  Lab 01/25/21 1501  ETH <10    IMAGING past 24 hours CT HEAD WO CONTRAST (5MM)  Result Date: 01/25/2021 CLINICAL DATA:  Left-sided weakness, facial droop EXAM: CT HEAD WITHOUT CONTRAST TECHNIQUE: Contiguous axial images were obtained from the base of the skull through the vertex without intravenous contrast. COMPARISON:  01/05/2021 FINDINGS: Brain: Age related volume loss. Extensive diffuse bilateral low-density throughout the deep white matter, likely chronic  small vessel disease. No acute intracranial abnormality. Specifically, no hemorrhage, hydrocephalus, mass lesion, acute infarction, or significant intracranial injury. Vascular: No hyperdense vessel or unexpected calcification. Skull: No acute calvarial abnormality. Sinuses/Orbits: Mucosal thickening throughout the paranasal sinuses. No air-fluid levels. Other: None IMPRESSION: Extensive chronic small vessel disease. No acute intracranial abnormality. Electronically Signed   By: Rolm Baptise M.D.   On: 01/25/2021 17:11   MR ANGIO HEAD WO CONTRAST  Result Date: 01/25/2021 CLINICAL DATA:  Syncope and left-sided weakness EXAM: MRA HEAD WITHOUT CONTRAST TECHNIQUE: Angiographic images of the Circle of Willis were acquired using MRA technique without intravenous contrast. COMPARISON:  No pertinent prior exam. FINDINGS: POSTERIOR CIRCULATION: --Vertebral arteries: Normal --Inferior cerebellar arteries: Normal. --Basilar artery: Normal. --Superior cerebellar arteries: Normal. --Posterior cerebral arteries: Normal. ANTERIOR CIRCULATION: --Intracranial internal carotid arteries: Normal. --Anterior cerebral arteries (ACA): Normal. --Middle cerebral arteries (MCA): There is a short segment area of signal loss within the distal M1 segment of the right MCA. Otherwise normal. ANATOMIC VARIANTS: None IMPRESSION: 1. Short segment area of signal loss in the distal M1 segment of the right MCA, possibly a short segment occlusion. 2. Otherwise normal intracranial MRA. Electronically Signed   By: Ulyses Jarred M.D.   On: 01/25/2021 23:32   MR BRAIN WO CONTRAST  Result Date: 01/25/2021 CLINICAL DATA:  Neurological deficit, acute, stroke suspected. Left-sided  weakness. EXAM: MRI HEAD WITHOUT CONTRAST TECHNIQUE: Multiplanar, multiecho pulse sequences of the brain and surrounding structures were obtained without intravenous contrast. COMPARISON:  Head CT earlier same day. FINDINGS: Brain: Diffusion imaging shows acute infarction  within the right hemisphere extensively affecting the subcortical white matter of the frontal and parietal regions, with a focal subcortical white matter infarction of the deep insula. This pattern suggests watershed infarction. Minimal chronic small-vessel ischemic changes affect the pons. No focal cerebellar finding. Cerebral hemispheres otherwise show chronic small-vessel ischemic changes of the deep and subcortical white matter of both hemispheres. There is an old small right parietal cortical and subcortical infarction. No evidence of mass, hemorrhage, hydrocephalus or extra-axial collection. Vascular: Major vessels at the base of the brain show flow. Skull and upper cervical spine: Negative Sinuses/Orbits: Mucosal inflammatory changes of the paranasal sinuses. Orbits negative. Other: None IMPRESSION: Acute infarction affecting the deep and subcortical white matter of the right hemisphere, primarily in the frontal and parietal region with a punctate focus in the deep insular white matter. This pattern/distribution suggests watershed infarction, though there does appear to be flow within the right internal carotid artery. Background pattern of chronic small vessel ischemic changes of the cerebral hemispheric white matter. Old small right parietal cortical infarction. Electronically Signed   By: Nelson Chimes M.D.   On: 01/25/2021 18:40   DG Chest Portable 1 View  Result Date: 01/25/2021 CLINICAL DATA:  Left-sided weakness and facial droop. EXAM: PORTABLE CHEST 1 VIEW COMPARISON:  November 07, 2009 FINDINGS: Very mild atelectasis is seen within the bilateral lung bases. There is no evidence of acute infiltrate, pleural effusion or pneumothorax. The heart size and mediastinal contours are within normal limits. The visualized skeletal structures are unremarkable. IMPRESSION: Very mild bibasilar atelectasis. Electronically Signed   By: Virgina Norfolk M.D.   On: 01/25/2021 17:56   VAS US CAROTID  Result Date:  01/26/2021 Carotid Arterial Duplex Study Patient Name:  [name]  Date of Exam:   01/26/2021 Medical Rec #: [id number]         Accession #:    [id number] Date of Birth: 04-06-1929         Patient Gender: F Patient Age:   77 years Exam Location:  [facility] Procedure:      VAS US CAROTID Referring Phys: Alferd Patee [facility] --------------------------------------------------------------------------------  Indications:       CVA. Risk Factors:      Hypertension, current smoker, prior CVA. Comparison Study:  CTA on 01/06/2021 WNL Performing Technologist: Rogelia Rohrer RVT, RDMS  Examination Guidelines: A complete evaluation includes B-mode imaging, spectral Doppler, color Doppler, and power Doppler as needed of all accessible portions of each vessel. Bilateral testing is considered an integral part of a complete examination. Limited examinations for reoccurring indications may be performed as noted.  Right Carotid Findings: +----------+--------+--------+--------+------------------+--------+           PSV cm/sEDV cm/sStenosisPlaque DescriptionComments +----------+--------+--------+--------+------------------+--------+ CCA Prox  100     11                                tortuous +----------+--------+--------+--------+------------------+--------+ CCA Distal66      9                                          +----------+--------+--------+--------+------------------+--------+ ICA Prox  56  15                                         +----------+--------+--------+--------+------------------+--------+ ICA Distal53      13                                         +----------+--------+--------+--------+------------------+--------+ ECA       42      3                                          +----------+--------+--------+--------+------------------+--------+ +----------+--------+-------+----------------+-------------------+           PSV cm/sEDV cmsDescribe        Arm  Pressure (mmHG) +----------+--------+-------+----------------+-------------------+ Subclavian122     108    Multiphasic, WNL                    +----------+--------+-------+----------------+-------------------+ +---------+--------+--+--------+-+---------+ VertebralPSV cm/s46EDV cm/s5Antegrade +---------+--------+--+--------+-+---------+  Left Carotid Findings: +----------+--------+--------+--------+------------------+--------+           PSV cm/sEDV cm/sStenosisPlaque DescriptionComments +----------+--------+--------+--------+------------------+--------+ CCA Prox  111     7                                 tortuous +----------+--------+--------+--------+------------------+--------+ CCA Distal71      9                                          +----------+--------+--------+--------+------------------+--------+ ICA Prox  85      15                                         +----------+--------+--------+--------+------------------+--------+ ICA Distal83      14                                         +----------+--------+--------+--------+------------------+--------+ ECA       50                                                 +----------+--------+--------+--------+------------------+--------+ +----------+--------+--------+----------------+-------------------+           PSV cm/sEDV cm/sDescribe        Arm Pressure (mmHG) +----------+--------+--------+----------------+-------------------+ [id number]             Multiphasic, WNL                    +----------+--------+--------+----------------+-------------------+ +---------+--------+--+--------+-+---------+ VertebralPSV cm/s31EDV cm/s6Antegrade +---------+--------+--+--------+-+---------+   Summary: Right Carotid: The extracranial vessels were near-normal with only minimal wall                thickening or plaque. Vertebrals:  Bilateral vertebral arteries demonstrate antegrade flow. Subclavians:  Normal flow hemodynamics were seen in bilateral subclavian  arteries. *See table(s) above for measurements and observations.  Electronically signed by Antony Contras MD on 01/26/2021 at 12:47:36 PM.    Final     PHYSICAL EXAM Pleasant elderly African-American lady not in distress. . Afebrile. Head is nontraumatic. Neck is supple without bruit.    Cardiac exam no murmur or gallop. Lungs are clear to auscultation. Distal pulses are well felt.  Neurological Exam :  Mental status/Cognition: Alert, oriented to self, place, month and year, good attention.  Speech/language: Fluent, comprehension intact, object naming intact, repetition intact.  Mild dysarthria but no aphasia. Cranial nerves:   CN II Pupils equal and reactive to light, no VF deficits    CN III,IV,VI EOM intact, no gaze preference or deviation, no nystagmus    CN V normal sensation in V1, V2, and V3 segments bilaterally    CN VII L facial drrop   CN VIII normal hearing to speech    CN IX & X normal palatal elevation, no uvular deviation    CN XI 5/5 head turn and 5/5 shoulder shrug on right. 0/5 on the left.   CN XII midline tongue protrusion   Motor system exam shows dense left hemiplegia with no response to even painful stimulus in the upper extremity and trace withdrawal in the left lower extremity.  This hypotonia and decreased tone on the left.   Gait deferred  ASSESSMENT/PLAN [name] is a 87 y.o. female with history of   hypertension, arthritis, current smoker, cervical degenerative disc with C3-C4 large central disc herniation with early abnormal T2 signal within the cord on recent MRI, hx of bilateral DVTs, recent scattered right MCA territory infarct with high-grade right M1 stenosis in September 2022 on aspirin and Plavix for 3 months followed by aspirin alone who presents with worsening left-sided weakness and found to have expansion of right MCA territory infarct.   She was recently evaluated by our  team a couple weeks ago for left-sided weakness and found to have a right MCA infarct with high-grade right M1 stenosis.  She was doing much better and was discharged back home.  She went to bed at 2030 on 01/23/21 and woke up with weakness on the left. Feels like this has gotten worse since.  She was evaluated by Hayley PCP on tele. Daughter, Pamala [name], mentioned systolic blood pressure in 90s from Hayley baseline in 120s-130s. She was told to stop lisinopril and come to the emergency department.     Daughter reports confusion about aspirin and they thought aspirin was for pain. She has not been taking aspirin regularly since discharge. Has been taking plavix daily though.   Daughter also reports that patient has cut down smoking significantly and only smokes 1-2 cigarettes every other day.   Work-up here with an MRI brain without contrast demonstrate expansion of the scattered right MCA infarct.  Stroke:  right mca infarct embolic secondary to large vessel disease -high-grade right middle cerebral artery stenosis. CT head No acute abnormality.  Small vessel disease. Atrophy.  ASPECTS 10.     CTA head & neck  no large vessel occlusion. Severe high grade stenosis at the distal right M1 segment/right mca bifurcation.   MRI  BRAIN:  Acute infarction affecting the deep and subcortical white matter of the right hemisphere, primarily in the frontal and parietal region with a punctate focus in the deep insular white matter. This pattern/distribution suggests watershed infarction, though there does appear to be flow within the right internal carotid artery.  MRA ANGIO HEAD:  1. Short segment area of signal loss in the distal M1 segment of the right MCA, possibly a short segment occlusion. 2. Otherwise normal intracranial MRA.    2D Echo  01/05/2021: lvef 60-65%, NO LVH, left atrial size was normal, no IA shunt   LDL 53 HgbA1c 5.7 VTE prophylaxis - scd     Diet   Diet Heart Room service appropriate? Yes;  Fluid consistency: Thin   aspirin 325 mg daily and clopidogrel 75 mg daily prior to admission, now on aspirin 81 mg daily and Brilinta (ticagrelor) 90 mg bid.   Therapy recommendations:  pending Disposition:  pending  Hypertension Home meds:  none Stable Permissive hypertension (OK if < 220/120) but gradually normalize in 5-7 days Long-term BP goal normotensive  Hyperlipidemia Home meds:  lipitor,  resumed in hospital LDL 53, goal < 70  High intensity statin   Continue statin at discharge  HgbA1c 5.7, goal < 7.0 CBGs No results for input(s): GLUCAP in the last 72 hours.  SSI  Other Stroke Risk Factors  Advanced Age >/= 59   Cigarette smoker advised to stop smoking  Hx stroke/TIA  Coronary artery disease    Other Active Problems    Hospital day # 0   I have personally obtained history,examined this patient, reviewed notes, independently viewed imaging studies, participated in medical decision making and plan of care.ROS completed by me personally and pertinent positives fully documented  I have made any additions or clarifications directly to the above note. Agree with note above.  Patient with recent right MCA infarct due to symptomatic right M1 stenosis presents with worsening of deficits due to extension of recent infarct.  Patient is not a candidate for aggressive treatment with angioplasty stenting given Hayley advanced age and poor baseline functioning.  Patient has not been taking aspirin also on Plavix alone.  Recommend aspirin 81 and Brilinta 90 mg twice daily for 3 months followed by aggressive risk factor modification.  Therapy evaluation and will likely need to go to rehab.  Long discussion with patient and daughter at bedside and answered questions.  Greater than 50% time during this 35-minute visit was spent on counseling and coordination of care about Hayley stroke and answering questions.  Antony Contras, MD Medical Director [facility] Pager:  [phone] 01/26/2021 2:49 PM   To contact Stroke Continuity provider, please refer to [email or web address] After hours, contact General Neurology

## 2021-01-27 ENCOUNTER — Other Ambulatory Visit: Payer: Self-pay | Admitting: *Deleted

## 2021-01-27 ENCOUNTER — Other Ambulatory Visit: Payer: Self-pay

## 2021-01-27 DIAGNOSIS — I10 Essential (primary) hypertension: Secondary | ICD-10-CM | POA: Diagnosis not present

## 2021-01-27 DIAGNOSIS — N179 Acute kidney failure, unspecified: Secondary | ICD-10-CM | POA: Diagnosis not present

## 2021-01-27 DIAGNOSIS — I639 Cerebral infarction, unspecified: Secondary | ICD-10-CM | POA: Diagnosis not present

## 2021-01-27 LAB — BASIC METABOLIC PANEL
Anion gap: 8 (ref 5–15)
BUN: 45 mg/dL — ABNORMAL HIGH (ref 8–23)
CO2: 22 mmol/L (ref 22–32)
Calcium: 8.9 mg/dL (ref 8.9–10.3)
Chloride: 107 mmol/L (ref 98–111)
Creatinine, Ser: 1.26 mg/dL — ABNORMAL HIGH (ref 0.44–1.00)
GFR, Estimated: 41 mL/min — ABNORMAL LOW (ref >60)
Glucose, Bld: 114 mg/dL — ABNORMAL HIGH (ref 70–99)
Potassium: 4.4 mmol/L (ref 3.5–5.1)
Sodium: 137 mmol/L (ref 135–145)

## 2021-01-27 LAB — RESP PANEL BY RT-PCR (FLU A&B, COVID) ARPGX2
Influenza A by PCR: NEGATIVE
Influenza B by PCR: NEGATIVE
SARS Coronavirus 2 by RT PCR: NEGATIVE

## 2021-01-27 NOTE — Evaluation (Signed)
Speech Language Pathology Evaluation Patient Details Name: [name] MRN: [id number] DOB: [birth year] Today's Date: 01/27/2021 Time: 0076-2263 SLP Time Calculation (min) (ACUTE ONLY): 25 min  Problem List:  Patient Active Problem List   Diagnosis Date Noted   Acute CVA (cerebrovascular accident) ([place]) 01/25/2021   AKI (acute kidney injury) ([place]) 01/25/2021   CVA (cerebral vascular accident) ([place]) 01/06/2021   Essential hypertension 01/06/2021   Arthritis 01/06/2021   Pain due to onychomycosis of toenails of both feet 12/14/2018   Presbycusis of both ears 09/22/2015   Syncope 05/24/2013   Past Medical History:  Past Medical History:  Diagnosis Date   Allergic rhinitis    Arthritis    Carpal tunnel syndrome    Deep venous thrombosis (HCC)    bilateral legs   Degenerative arthritis    right knee   Hypertension    Lumbar degenerative disc disease    Lump or mass in breast    Paresthesia    Syncope 05/24/2013   Vitamin D deficiency    Past Surgical History:  Past Surgical History:  Procedure Laterality Date   ABDOMINAL HYSTERECTOMY     CARPAL TUNNEL RELEASE Right    CATARACT EXTRACTION Bilateral    HEMORROIDECTOMY     IVC Filter     TOTAL HIP ARTHROPLASTY Right    ULNAR NERVE TRANSPOSITION Right    HPI:  87 y.o.  female presents to ED presents with worsening left-sided weakness and found to have expansion of right MCA territory infarct (primarily frontal and parietal with punctate focus deep insular white matter). PMH significant for hypertension, arthritis, R THA, IVC filter, ulnar n transposition, current smoker, cervical degenerative disc with C3-C4 large central disc herniation, hx of bilateral DVTs, recent scattered right MCA territory infarct.   Assessment / Plan / Recommendation Clinical Impression  Patient presents with a mild cognitive impairment and mild speech articulation impairment. She participated in [place] test for cognition and received a score of 25  out of 30 placting her at the upper end of Mild Neurocognitive deficit (21-26) as she stated she completed HS. Her primary deficits are in awareness to her impairments (did not seem to be aware of significant paresis of left arm) and mild memory impairment. Plan is for patient to dischage to SNF and so SLP is recommending skilled ST services at next venue of care but not recommending acute level ST.    SLP Assessment  SLP Recommendation/Assessment: All further Speech Lanaguage Pathology  needs can be addressed in the next venue of care SLP Visit Diagnosis: Cognitive communication deficit (R41.841);Dysarthria and anarthria (R47.1)    Recommendations for follow up therapy are one component of a multi-disciplinary discharge planning process, led by the attending physician.  Recommendations may be updated based on patient status, additional functional criteria and insurance authorization.    Follow Up Recommendations  24 hour supervision/assistance;Skilled Nursing facility    Frequency and Duration   N/A        SLP Evaluation Cognition  Overall Cognitive Status: Impaired/Different from baseline Orientation Level: Oriented X4 Year: 2022 Month: October Day of Week: Incorrect Attention: Sustained Sustained Attention: Appears intact Memory: Impaired Memory Impairment: Other (comment) (recalled 3/5 words after 2 minute delay) Awareness: Impaired Awareness Impairment: Emergent impairment;Intellectual impairment Problem Solving: Appears intact Safety/Judgment: Impaired Comments: Patient not demonstrating awareness to her physical deficits       Comprehension  Auditory Comprehension Overall Auditory Comprehension: Appears within functional limits for tasks assessed    Expression Expression Primary Mode  of Expression: Verbal Verbal Expression Overall Verbal Expression: Appears within functional limits for tasks assessed Initiation: No impairment Repetition: No impairment Naming: No  impairment Pragmatics: No impairment   Oral / Motor  Oral Motor/Sensory Function Overall Oral Motor/Sensory Function: Mild impairment Facial ROM: Reduced left;Suspected CN VII (facial) dysfunction Facial Symmetry: Abnormal symmetry left Facial Strength: Reduced left Lingual ROM: Within Functional Limits Lingual Symmetry: Within Functional Limits Lingual Strength: Within Functional Limits Velum: Within Functional Limits Mandible: Within Functional Limits Motor Speech Overall Motor Speech: Impaired Respiration: Within functional limits Phonation: Normal Resonance: Within functional limits Articulation: Impaired Level of Impairment: Conversation Intelligibility: Intelligibility reduced Word: 75-100% accurate Phrase: 75-100% accurate Sentence: 75-100% accurate Conversation: 75-100% accurate (75-80%) Motor Planning: Witnin functional limits Motor Speech Errors: Not applicable Effective Techniques: Slow rate;Increased vocal intensity;Over-articulate   GO                    Sonia Baller, MA, CCC-SLP Speech Therapy

## 2021-01-27 NOTE — Progress Notes (Signed)
PROGRESS NOTE  [name]    DOB: [birth year], 87 y.o.  SWH:[id number]  PCP: Nolene Ebbs, MD   Code Status: Full Code   DOA: 01/25/2021   LOS: 1  Brief Narrative of Current Hospitalization  [name] is a 87 y.o. female with a PMH significant for CVA, HTN, DVT, DDD, syncope. They presented from home where she lives with her daughter to the ED on 01/25/2021 with stroke symptoms including left-sided weakness x 1 day. In the ED, it was found that they had acute CVA. They were treated with diagnostic work-up and medical management as she was outside of the interventional window.  Patient was admitted to medicine service for further workup and management of CVA as outlined in detail below.  01/27/21 -feeling well, stable  Assessment & Plan  Principal Problem:   Acute CVA (cerebrovascular accident) ([place]) Active Problems:   Essential hypertension   AKI (acute kidney injury) ([place])  Acute CVA with residual left-sided weakness.  Lipid panel normal with LDL 53.  hgb A1c 5.7 which is well below goal for patient's age.  Patient's daughter is hopeful for rehab after discharge as she will have difficulty with transporting patient with new left-sided paralysis. - neurology following, appreciate their recommendations - Allow for permissive HTN (systolic < 665 and diastolic < 993)  - Continue with IV fluids to support blood pressure - Continue daily aspirin, Plavix, statin - Hold off on echocardiogram, A1c, lipid panel given this was performed 1 month ago - Tele monitoring  - SLP eval - PT/OT -Counseled patient on smoking cessation   AKI- improving Cr. 1.73>1.64>1.26 - Continue with IV fluids - Trend renal function electrolytes - Avoid nephrotoxic agents   HTN - Hold antihypertensives in the setting of AKI, low normal blood pressures, acute CVA  DVT prophylaxis: enoxaparin (LOVENOX) injection 30 mg Start: 01/25/21 2100  Diet:  Diet Orders (From admission, onward)     Start      Ordered   01/25/21 2140  Diet Heart Room service appropriate? Yes; Fluid consistency: Thin  Diet effective now       Question Answer Comment  Room service appropriate? Yes   Fluid consistency: Thin      01/25/21 2139            Subjective 01/27/21    Patient has no complaints or concerns today.  She remains stable with left-sided weakness.  Her daughter again continues to endorse that they would like PT, OT, SLP and rehab after discharge.  Disposition Plan & Communication  Status is: Observation  The patient will require care spanning > 2 midnights and should be moved to inpatient because: Ongoing diagnostic testing needed not appropriate for outpatient work up  Dispo: The patient is from: Home              Anticipated d/c is to: SNF              Patient currently is not medically stable to d/c.   Difficult to place patient No   Family Communication: Daughter at bedside  Consults, Procedures, Significant Events  Consultants:  Neurology  Procedures/significant events:  Carotid Dopplers  Antimicrobials:  Anti-infectives (From admission, onward)    None        Objective   Vitals:   01/26/21 1500 01/26/21 1952 01/27/21 0000 01/27/21 0341  BP: (!) 100/54 (!) 106/51 (!) 117/56 (!) 112/54  Pulse: 74 72 72 84  Resp: 19 19 19 17   Temp:  97.8  F (36.6 C) 97.6 F (36.4 C) 97.8 F (36.6 C)  TempSrc:  Oral Oral   SpO2: 95% 100% 100% 99%    Intake/Output Summary (Last 24 hours) at 01/27/2021 0730 Last data filed at 01/26/2021 1325 Gross per 24 hour  Intake 1007.74 ml  Output --  Net 1007.74 ml   There were no vitals filed for this visit.  Patient BMI: There is no height or weight on file to calculate BMI.   Physical Exam: General: awake, alert, NAD HEENT: atraumatic, clear conjunctiva, anicteric sclera, moist mucus membranes, hearing grossly normal Respiratory: normal respiratory effort. Cardiovascular: quick capillary refill  Gastrointestinal: soft, NT,  ND, no HSM felt Nervous: A&O x3. no gross focal neurologic deficits, normal speech Extremities: moves all right-sided arm and leg like normal.  Strength 1 out of 5 on left.  Sensation intact Skin: dry, intact, normal temperature, normal color, No rashes, lesions or ulcers Psychiatry: normal mood, congruent affect  Labs   I have personally reviewed following labs and imaging studies Admission on 01/25/2021  Component Date Value Ref Range Status   SARS Coronavirus 2 by RT PCR 01/25/2021 NEGATIVE  NEGATIVE Final   Influenza A by PCR 01/25/2021 NEGATIVE  NEGATIVE Final   Influenza B by PCR 01/25/2021 NEGATIVE  NEGATIVE Final   Alcohol, Ethyl (B) 01/25/2021 <10  <10 mg/dL Final   Prothrombin Time 01/25/2021 14.6  11.4 - 15.2 seconds Final   INR 01/25/2021 1.1  0.8 - 1.2 Final   aPTT 01/25/2021 29  24 - 36 seconds Final   Sodium 01/25/2021 134 (A) 135 - 145 mmol/L Final   Potassium 01/25/2021 5.0  3.5 - 5.1 mmol/L Final   Chloride 01/25/2021 101  98 - 111 mmol/L Final   CO2 01/25/2021 20 (A) 22 - 32 mmol/L Final   Glucose, Bld 01/25/2021 111 (A) 70 - 99 mg/dL Final   BUN 01/25/2021 59 (A) 8 - 23 mg/dL Final   Creatinine, Ser 01/25/2021 1.73 (A) 0.44 - 1.00 mg/dL Final   Calcium 01/25/2021 9.6  8.9 - 10.3 mg/dL Final   Total Protein 01/25/2021 7.1  6.5 - 8.1 g/dL Final   Albumin 01/25/2021 3.4 (A) 3.5 - 5.0 g/dL Final   AST 01/25/2021 33  15 - 41 U/L Final   ALT 01/25/2021 17  0 - 44 U/L Final   Alkaline Phosphatase 01/25/2021 54  38 - 126 U/L Final   Total Bilirubin 01/25/2021 1.1  0.3 - 1.2 mg/dL Final   GFR, Estimated 01/25/2021 28 (A) >60 mL/min Final   Anion gap 01/25/2021 13  5 - 15 Final   Opiates 01/25/2021 NONE DETECTED  NONE DETECTED Final   Cocaine 01/25/2021 NONE DETECTED  NONE DETECTED Final   Benzodiazepines 01/25/2021 NONE DETECTED  NONE DETECTED Final   Amphetamines 01/25/2021 NONE DETECTED  NONE DETECTED Final   Tetrahydrocannabinol 01/25/2021 NONE DETECTED  NONE  DETECTED Final   Barbiturates 01/25/2021 NONE DETECTED  NONE DETECTED Final   Color, Urine 01/25/2021 YELLOW  YELLOW Final   APPearance 01/25/2021 HAZY (A) CLEAR Final   Specific Gravity, Urine 01/25/2021 1.016  1.005 - 1.030 Final   pH 01/25/2021 5.0  5.0 - 8.0 Final   Glucose, UA 01/25/2021 NEGATIVE  NEGATIVE mg/dL Final   Hgb urine dipstick 01/25/2021 NEGATIVE  NEGATIVE Final   Bilirubin Urine 01/25/2021 NEGATIVE  NEGATIVE Final   Ketones, ur 01/25/2021 NEGATIVE  NEGATIVE mg/dL Final   Protein, ur 01/25/2021 NEGATIVE  NEGATIVE mg/dL Final   Nitrite 01/25/2021 NEGATIVE  NEGATIVE Final   Leukocytes,Ua 01/25/2021 NEGATIVE  NEGATIVE Final   WBC 01/25/2021 8.7  4.0 - 10.5 K/uL Final   RBC 01/25/2021 4.55  3.87 - 5.11 MIL/uL Final   Hemoglobin 01/25/2021 14.3  12.0 - 15.0 g/dL Final   HCT 01/25/2021 44.9  36.0 - 46.0 % Final   MCV 01/25/2021 98.7  80.0 - 100.0 fL Final   MCH 01/25/2021 31.4  26.0 - 34.0 pg Final   MCHC 01/25/2021 31.8  30.0 - 36.0 g/dL Final   RDW 01/25/2021 13.3  11.5 - 15.5 % Final   Platelets 01/25/2021 194  150 - 400 K/uL Final   nRBC 01/25/2021 0.0  0.0 - 0.2 % Final   Neutrophils Relative % 01/25/2021 69  % Final   Neutro Abs 01/25/2021 6.1  1.7 - 7.7 K/uL Final   Lymphocytes Relative 01/25/2021 18  % Final   Lymphs Abs 01/25/2021 1.6  0.7 - 4.0 K/uL Final   Monocytes Relative 01/25/2021 8  % Final   Monocytes Absolute 01/25/2021 0.7  0.1 - 1.0 K/uL Final   Eosinophils Relative 01/25/2021 3  % Final   Eosinophils Absolute 01/25/2021 0.3  0.0 - 0.5 K/uL Final   Basophils Relative 01/25/2021 1  % Final   Basophils Absolute 01/25/2021 0.0  0.0 - 0.1 K/uL Final   Immature Granulocytes 01/25/2021 1  % Final   Abs Immature Granulocytes 01/25/2021 0.06  0.00 - 0.07 K/uL Final   Sodium 01/26/2021 135  135 - 145 mmol/L Final   Potassium 01/26/2021 4.2  3.5 - 5.1 mmol/L Final   Chloride 01/26/2021 103  98 - 111 mmol/L Final   CO2 01/26/2021 19 (A) 22 - 32 mmol/L Final    Glucose, Bld 01/26/2021 115 (A) 70 - 99 mg/dL Final   BUN 01/26/2021 59 (A) 8 - 23 mg/dL Final   Creatinine, Ser 01/26/2021 1.64 (A) 0.44 - 1.00 mg/dL Final   Calcium 01/26/2021 9.3  8.9 - 10.3 mg/dL Final   GFR, Estimated 01/26/2021 30 (A) >60 mL/min Final   Anion gap 01/26/2021 13  5 - 15 Final   Sodium 01/27/2021 137  135 - 145 mmol/L Final   Potassium 01/27/2021 4.4  3.5 - 5.1 mmol/L Final   Chloride 01/27/2021 107  98 - 111 mmol/L Final   CO2 01/27/2021 22  22 - 32 mmol/L Final   Glucose, Bld 01/27/2021 114 (A) 70 - 99 mg/dL Final   BUN 01/27/2021 45 (A) 8 - 23 mg/dL Final   Creatinine, Ser 01/27/2021 1.26 (A) 0.44 - 1.00 mg/dL Final   Calcium 01/27/2021 8.9  8.9 - 10.3 mg/dL Final   GFR, Estimated 01/27/2021 41 (A) >60 mL/min Final   Anion gap 01/27/2021 8  5 - 15 Final    Imaging Studies  VAS US CAROTID  Result Date: 01/26/2021 Carotid Arterial Duplex Study Patient Name:  CORAIMA TIBBS  Date of Exam:   01/26/2021 Medical Rec #: [id number]         Accession #:    [id number] Date of Birth: 1928/08/05         Patient Gender: F Patient Age:   77 years Exam Location[facility]  [facility] Procedure:      VAS US CAROTID Referring Phys: Alferd Patee [facility] --------------------------------------------------------------------------------  Indications:       CVA. Risk Factors:      Hypertension, current smoker, prior CVA. Comparison Study:  CTA on 01/06/2021 WNL Performing Technologist: Rogelia Rohrer RVT, RDMS  Examination Guidelines: A complete evaluation  includes B-mode imaging, spectral Doppler, color Doppler, and power Doppler as needed of all accessible portions of each vessel. Bilateral testing is considered an integral part of a complete examination. Limited examinations for reoccurring indications may be performed as noted.  Right Carotid Findings: +----------+--------+--------+--------+------------------+--------+           PSV cm/sEDV cm/sStenosisPlaque DescriptionComments  +----------+--------+--------+--------+------------------+--------+ CCA Prox  100     11                                tortuous +----------+--------+--------+--------+------------------+--------+ CCA Distal66      9                                          +----------+--------+--------+--------+------------------+--------+ ICA Prox  56      15                                         +----------+--------+--------+--------+------------------+--------+ ICA Distal53      13                                         +----------+--------+--------+--------+------------------+--------+ ECA       42      3                                          +----------+--------+--------+--------+------------------+--------+ +----------+--------+-------+----------------+-------------------+           PSV cm/sEDV cmsDescribe        Arm Pressure (mmHG) +----------+--------+-------+----------------+-------------------+ Subclavian122     108    Multiphasic, WNL                    +----------+--------+-------+----------------+-------------------+ +---------+--------+--+--------+-+---------+ VertebralPSV cm/s46EDV cm/s5Antegrade +---------+--------+--+--------+-+---------+  Left Carotid Findings: +----------+--------+--------+--------+------------------+--------+           PSV cm/sEDV cm/sStenosisPlaque DescriptionComments +----------+--------+--------+--------+------------------+--------+ CCA Prox  111     7                                 tortuous +----------+--------+--------+--------+------------------+--------+ CCA Distal71      9                                          +----------+--------+--------+--------+------------------+--------+ ICA Prox  85      15                                         +----------+--------+--------+--------+------------------+--------+ ICA Distal83      14                                          +----------+--------+--------+--------+------------------+--------+ ECA       50                                                 +----------+--------+--------+--------+------------------+--------+ +----------+--------+--------+----------------+-------------------+  PSV cm/sEDV cm/sDescribe        Arm Pressure (mmHG) +----------+--------+--------+----------------+-------------------+ [id number]             Multiphasic, WNL                    +----------+--------+--------+----------------+-------------------+ +---------+--------+--+--------+-+---------+ VertebralPSV cm/s31EDV cm/s6Antegrade +---------+--------+--+--------+-+---------+   Summary: Right Carotid: The extracranial vessels were near-normal with only minimal wall                thickening or plaque. Vertebrals:  Bilateral vertebral arteries demonstrate antegrade flow. Subclavians: Normal flow hemodynamics were seen in bilateral subclavian              arteries. *See table(s) above for measurements and observations.  Electronically signed by Antony Contras MD on 01/26/2021 at 12:47:36 PM.    Final    Medications   Scheduled Meds:  aspirin  81 mg Oral Daily   atorvastatin  20 mg Oral Daily   enoxaparin (LOVENOX) injection  30 mg Subcutaneous Q24H   polyethylene glycol  17 g Oral Daily   ticagrelor  90 mg Oral BID     LOS: 1 day   Time spent: >30min  Karenann Mcgrory L Chevi Lim, DO [facility] 01/27/2021, 7:30 AM   To contact the [facility] Attending or Consulting provider for this patient: Check the care team in [facility] for a) attending/consulting [place] provider listed and b) the [facility] team listed Log into [email or web address] and use 's universal password to access. If you do not have the password, please contact the hospital operator. Locate the [facility] provider you are looking for under [facility] and page to a number that you can be directly reached. If you still have difficulty reaching the provider,  please page the [facility] (Director on Call) for the Hospitalists listed on amion for assistance.

## 2021-01-27 NOTE — Patient Outreach (Signed)
[facility]) Care Management  01/27/2021  [name] 04/03/1929 [id number]   Member readmitted to hospital on 10/10 for recurrent stroke.  Per notes, will discharge to SNF.  Will close case at this time.  Valente David, [place], MSN [place] [phone]

## 2021-01-27 NOTE — Therapy (Signed)
[facility] [address] [place], [place], [address] Phone: [phone]   Fax:  [phone]  Patient Details  Name: [name] MRN: [id number] Date of Birth: [birth year] Referring Provider:  No ref. provider found  Encounter Date: 01/27/2021  Pt was seen for a total of 3 visits for O.T., last seen on 01/20/21. Pt is being discharged from outpatient O.T. due to change in medical status (new stroke) and hospitalized.    Carey Bullocks, OTR/L 01/27/2021, 12:41 PM  [facility] [address] [place], [place], [address] Phone: [phone]   Fax:  [phone]

## 2021-01-27 NOTE — TOC Initial Note (Addendum)
Transition of Care [facility]) - Initial/Assessment Note    Patient Details  Name: [name] MRN: [id number] Date of Birth: [birth year]  [facility] CM/SW Contact:    Hayley Friar, RN Phone Number: 01/27/2021, 11:27 AM  Clinical Narrative:                 Patient is from home with her daughter, [name]. [name] has been caring for the patient at home since last CVA and taking her to outpatient rehab at [facility].  Recommendations are for SNF rehab. Pt and [name] are in agreement and are asking for either [facility] or [facility]. Will complete FL2 and fax her to the 2 facilities.  Pt and daughter interested in having Advanced Directives completed. CM has placed order for [facility] and provided documents to be filled out to [place]. TOC following.  1555: pt has received a bed offer from [facility] and pt accepted. Will start insurance auth, and covid test.  Expected Discharge Plan: [place] Barriers to Discharge: Continued Medical Work up   Patient Goals and CMS Choice   CMS Medicare.gov Compare Post Acute Care list provided to:: Patient Represenative (must comment) Choice offered to / list presented to : Adult Children  Expected Discharge Plan and Services Expected Discharge Plan: [place] In-house Referral: Clinical Social Work Discharge Planning Services: CM Consult Post Acute Care Choice: [place] arrangements for the past 2 months: [place]                                      Prior Living Arrangements/Services Living arrangements for the past 2 months: Single Family Home Lives with:: Adult Children Patient language and need for interpreter reviewed:: Yes Do you feel safe going back to the place where you live?: Yes      Need for Family Participation in Patient Care: Yes (Comment) Care giver support system in place?: Yes (comment) Current home  services: DME (cane/ wheelchair/ 3 in 1/ hospital bed) Criminal Activity/Legal Involvement Pertinent to Current Situation/Hospitalization: No - Comment as needed  Activities of Daily Living      Permission Sought/Granted                  Emotional Assessment Appearance:: Appears stated age Attitude/Demeanor/Rapport: Engaged Affect (typically observed): Accepting Orientation: : Oriented to Self, Oriented to Place, Oriented to Situation   Psych Involvement: No (comment)  Admission diagnosis:  Acute ischemic stroke ([place]) [I63.9] Acute kidney injury ([place]) [N17.9] Acute CVA (cerebrovascular accident) [facility]) [I63.9] Patient Active Problem List   Diagnosis Date Noted   Acute CVA (cerebrovascular accident) ([place]) 01/25/2021   AKI (acute kidney injury) ([place]) 01/25/2021   CVA (cerebral vascular accident) ([place]) 01/06/2021   Essential hypertension 01/06/2021   Arthritis 01/06/2021   Pain due to onychomycosis of toenails of both feet 12/14/2018   Presbycusis of both ears 09/22/2015   Syncope 05/24/2013   PCP:  Hayley Ebbs, MD Pharmacy:   [id number] [place], [place] [address] Phone: [phone] Fax: [phone]     Social Determinants of Health (SDOH) Interventions    Readmission Risk Interventions No flowsheet data found.

## 2021-01-27 NOTE — Progress Notes (Signed)
Physical Therapy Treatment Patient Details Name: [name] MRN: [id number] DOB: January 07, 1929 Today's Date: 01/27/2021   History of Present Illness 87 y.o.  female presents to ED presents with worsening left-sided weakness and found to have expansion of right MCA territory infarct (primarily frontal and parietal with punctate focus deep insular white matter). PMH significant for hypertension, arthritis, R THA, IVC filter, ulnar n transposition, current smoker, cervical degenerative disc with C3-C4 large central disc herniation, hx of bilateral DVTs, recent scattered right MCA territory infarct.    PT Comments    Pt received in supine, alert and oriented to self/location and somewhat to situation, able to state month/year when looking at board in room. Pt with good participation in seated/standing transfer training. Pt performed transfers x2 upright with +2 maxA at RW, may benefit from [facility] or L hand rolling walker splint next session due to minimal LUE strength and unable to grip RW. Pt unable to weight shift in stance. Pt rolling better and able to assist with sidelying to sit transfer but continues to need +2 mod to maxA for safety with each task. Pt continues to benefit from PT services to progress toward functional mobility goals.   Recommendations for follow up therapy are one component of a multi-disciplinary discharge planning process, led by the attending physician.  Recommendations may be updated based on patient status, additional functional criteria and insurance authorization.  Follow Up Recommendations  SNF     Equipment Recommendations  None recommended by PT    Recommendations for Other Services       Precautions / Restrictions Precautions Precautions: Fall Precaution Comments: sacral skin breakdown (between buttocks), LUE/LE weakness/inattention Restrictions Weight Bearing Restrictions: No     Mobility  Bed Mobility Overal bed mobility: Needs Assistance Bed  Mobility: Rolling;Sidelying to Sit;Sit to Sidelying Rolling: Mod assist Sidelying to sit: Max assist;HOB elevated     Sit to sidelying: Mod assist;+2 for physical assistance General bed mobility comments: HOB ~20 deg while getting to EOB; pt on bed pan upon PTA arrival to room and she was able to roll to L side with tactile/verbal cues for cross-body reaching and to pull on bed rail    Transfers Overall transfer level: Needs assistance Equipment used: Rolling walker (2 wheeled) Transfers: Sit to/from Stand Sit to Stand: Max assist;+2 physical assistance;From elevated surface         General transfer comment: LLE blocked, bed elevated, able to reach upright x2 trials, up to 10 seconds at a time while pad changed under her  Ambulation/Gait                 Stairs             Wheelchair Mobility    Modified Rankin (Stroke Patients Only) Modified Rankin (Stroke Patients Only) Pre-Morbid Rankin Score: Moderate disability Modified Rankin: Severe disability     Balance Overall balance assessment: Needs assistance Sitting-balance support: Single extremity supported Sitting balance-Leahy Scale: Poor (posterior lean) Sitting balance - Comments: min guard to minA for sitting balance with U UE support Postural control: Posterior lean Standing balance support: Single extremity supported Standing balance-Leahy Scale: Zero Standing balance comment: +2 maxA and RW using R hand                            Cognition Arousal/Alertness: Awake/alert Behavior During Therapy: WFL for tasks assessed/performed Overall Cognitive Status: Impaired/Different from baseline Area of Impairment: Attention;Memory;Safety/judgement;Awareness;Problem solving  Current Attention Level: Sustained Memory: Decreased short-term memory;Decreased recall of precautions   Safety/Judgement: Decreased awareness of deficits;Decreased awareness of safety Awareness:  Emergent Problem Solving: Slow processing;Decreased initiation General Comments: pt aware that deficits are worse than previous stroke (pt was using cane and walking PTA), pt denies fatigue but falling asleep toward end of session; friend [name] present in room (caregiver?)      Exercises Other Exercises Other Exercises: supine RLE AROM: ankle pumps, heel slides, hip abduction; supine LLE AAROM: ankle pumps, heel slides, hip abduction (needs heavy assist, trace muscle activation in gravity eliminated position) x10 reps ea Other Exercises: seated BLE A/AAROM: SAQ x10 reps ea (bed in chair position, AA to PROM on LLE)    General Comments General comments (skin integrity, edema, etc.): skin breakdown between buttocks, see pain section above. HR 70's bpm      Pertinent Vitals/Pain Pain Assessment: Faces Faces Pain Scale: Hurts little more Pain Location: buttocks (between) Pain Descriptors / Indicators: Discomfort;Tender Pain Intervention(s): Monitored during session;Repositioned;Other (comment) (RN notified pt needs foam dressing and maybe skin barrier)    Home Living     Available Help at Discharge: Family;Available 24 hours/day Type of Home: House              Prior Function            PT Goals (current goals can now be found in the care plan section) Acute Rehab PT Goals Patient Stated Goal: to go to rehab PT Goal Formulation: With patient/family Time For Goal Achievement: 02/09/21 Progress towards PT goals: Progressing toward goals    Frequency    Min 3X/week      PT Plan Current plan remains appropriate    Co-evaluation              AM-PAC PT "6 Clicks" Mobility   Outcome Measure  Help needed turning from your back to your side while in a flat bed without using bedrails?: A Lot Help needed moving from lying on your back to sitting on the side of a flat bed without using bedrails?: A Lot Help needed moving to and from a bed to a chair (including a  wheelchair)?: Total Help needed standing up from a chair using your arms (e.g., wheelchair or bedside chair)?: Total Help needed to walk in hospital room?: Total Help needed climbing 3-5 steps with a railing? : Total 6 Click Score: 8    End of Session Equipment Utilized During Treatment: Gait belt Activity Tolerance: Patient tolerated treatment well Patient left: in bed;with call bell/phone within reach;with bed alarm set;with SCD's reapplied;with family/visitor present (bed in chair position, recommended HOB >30 degrees for at least 30 mins after she drinks for safety/to prevent aspiration) Nurse Communication: Mobility status;Other (comment);Precautions;Need for lift equipment (sacral skin breakdown; need hoyer lift for OOB) PT Visit Diagnosis: Hemiplegia and hemiparesis;Other symptoms and signs involving the nervous system (R29.898);Difficulty in walking, not elsewhere classified (R26.2) Hemiplegia - Right/Left: Left Hemiplegia - dominant/non-dominant: Non-dominant Hemiplegia - caused by: Cerebral infarction     Time: 1531-1610 PT Time Calculation (min) (ACUTE ONLY): 39 min  Charges:  $Therapeutic Exercise: 8-22 mins $Therapeutic Activity: 23-37 mins                     Rafaela Dinius P., PTA Acute Rehabilitation Services Pager: [phone] Office: [place] 01/27/2021, 6:12 PM

## 2021-01-27 NOTE — Plan of Care (Signed)
  Problem: Education: Goal: Knowledge of disease or condition will improve Outcome: Progressing Goal: Knowledge of secondary prevention will improve Outcome: Progressing   

## 2021-01-27 NOTE — NC FL2 (Signed)
[place] MEDICAID FL2 LEVEL OF CARE SCREENING TOOL     IDENTIFICATION  Patient Name: [name] Birthdate: November 28, 1928 Sex: female Admission Date (Current Location): 01/25/2021  [facility] and [place] Number:  Herbalist and Address:  [facility] [place], [place] [address] [place], [place] [address]      Provider Number: [id number]  Attending Physician Name and Address:  Richarda Osmond, MD  Relative Name and Phone Number:       Current Level of Care: Hospital Recommended Level of Care: [place] Prior Approval Number:    Date Approved/Denied:   PASRR Number: [id number]  Discharge Plan: SNF    Current Diagnoses: Patient Active Problem List   Diagnosis Date Noted   Acute CVA (cerebrovascular accident) ([place]) 01/25/2021   AKI (acute kidney injury) ([place]) 01/25/2021   CVA (cerebral vascular accident) ([place]) 01/06/2021   Essential hypertension 01/06/2021   Arthritis 01/06/2021   Pain due to onychomycosis of toenails of both feet 12/14/2018   Presbycusis of both ears 09/22/2015   Syncope 05/24/2013    Orientation RESPIRATION BLADDER Height & Weight     Self, Time, Situation, Place  Normal Incontinent Weight:   Height:     BEHAVIORAL SYMPTOMS/MOOD NEUROLOGICAL BOWEL NUTRITION STATUS      Incontinent Diet (heart healthy)  AMBULATORY STATUS COMMUNICATION OF NEEDS Skin   Extensive Assist Verbally Normal                       Personal Care Assistance Level of Assistance  Bathing, Feeding, Dressing Bathing Assistance: Maximum assistance Feeding assistance: Limited assistance Dressing Assistance: Maximum assistance     Functional Limitations Info  Speech     Speech Info: Impaired (dysarthria)    SPECIAL CARE FACTORS FREQUENCY  PT (By licensed PT), OT (By licensed OT)     PT Frequency: 5x/wk OT Frequency: 5x/wk            Contractures Contractures Info: Not present    Additional Factors Info  Code  Status, Allergies Code Status Info: Full Allergies Info: Codeine, Penicillins           Current Medications (01/27/2021):  This is the current hospital active medication list Current Facility-Administered Medications  Medication Dose Route Frequency Provider Last Rate Last Admin   0.9 %  sodium chloride infusion   Intravenous Continuous Marcelyn Bruins, MD 100 mL/hr at 01/27/21 0028 New Bag at 01/27/21 0028   acetaminophen (TYLENOL) tablet 650 mg  650 mg Oral Q4H PRN Marcelyn Bruins, MD   650 mg at 01/27/21 0802   Or   acetaminophen (TYLENOL) 160 MG/5ML solution 650 mg  650 mg Per Tube Q4H PRN Marcelyn Bruins, MD       Or   acetaminophen (TYLENOL) suppository 650 mg  650 mg Rectal Q4H PRN Marcelyn Bruins, MD       albuterol (PROVENTIL) (2.5 MG/3ML) 0.083% nebulizer solution 2.5 mg  2.5 mg Inhalation Q6H PRN Marcelyn Bruins, MD       aspirin EC tablet 81 mg  81 mg Oral Daily Garvin Fila, MD   81 mg at 01/27/21 0803   atorvastatin (LIPITOR) tablet 20 mg  20 mg Oral Daily Marcelyn Bruins, MD   20 mg at 01/27/21 0804   enoxaparin (LOVENOX) injection 30 mg  30 mg Subcutaneous Q24H Marcelyn Bruins, MD   30 mg at 01/26/21 2203   polyethylene glycol (MIRALAX / GLYCOLAX)  packet 17 g  17 g Oral Daily Doristine Mango L, MD   17 g at 01/26/21 0900   ticagrelor (BRILINTA) tablet 90 mg  90 mg Oral BID Dennison Mascot, PA-C   90 mg at 01/27/21 0223     Discharge Medications: Please see discharge summary for a list of discharge medications.  Relevant Imaging Results:  Relevant Lab Results:   Additional Information SS#: [id number]  Geralynn Ochs, LCSW

## 2021-01-27 NOTE — Progress Notes (Signed)
STROKE TEAM PROGRESS NOTE   INTERVAL HISTORY Patient is lying in bed.  No neurological changes.  Vital signs are stable.  No family at the bedside. Vitals:   01/27/21 0341 01/27/21 0845 01/27/21 1202 01/27/21 1239  BP: (!) 112/54 124/60  115/61  Pulse: 84 72  70  Resp: 17 18  18   Temp: 97.8 F (36.6 C) 98.1 F (36.7 C)  98.6 F (37 C)  TempSrc:  Oral  Oral  SpO2: 99% 100%    Weight:   95 kg   Height:   5\' 8"  (1.727 m)    CBC:  Recent Labs  Lab 01/25/21 1520  WBC 8.7  NEUTROABS 6.1  HGB 14.3  HCT 44.9  MCV 98.7  PLT 213   Basic Metabolic Panel:  Recent Labs  Lab 01/26/21 0351 01/27/21 0354  NA 135 137  K 4.2 4.4  CL 103 107  CO2 19* 22  GLUCOSE 115* 114*  BUN 59* 45*  CREATININE 1.64* 1.26*  CALCIUM 9.3 8.9    Lipid Panel: No results for input(s): CHOL, TRIG, HDL, CHOLHDL, VLDL, LDLCALC in the last 168 hours.  HgbA1c: No results for input(s): HGBA1C in the last 168 hours. Urine Drug Screen:  Recent Labs  Lab 01/25/21 2024  LABOPIA NONE DETECTED  COCAINSCRNUR NONE DETECTED  LABBENZ NONE DETECTED  AMPHETMU NONE DETECTED  THCU NONE DETECTED  LABBARB NONE DETECTED    Alcohol Level  Recent Labs  Lab 01/25/21 1501  ETH <10    IMAGING past 24 hours No results found.  PHYSICAL EXAM Pleasant elderly African-American lady not in distress. . Afebrile. Head is nontraumatic. Neck is supple without bruit.    Cardiac exam no murmur or gallop. Lungs are clear to auscultation. Distal pulses are well felt.  Neurological Exam :  Mental status/Cognition: Alert, oriented to self, place, month and year, good attention.  Speech/language: Fluent, comprehension intact, object naming intact, repetition intact.  Mild dysarthria but no aphasia. Cranial nerves:   CN II Pupils equal and reactive to light, no VF deficits    CN III,IV,VI EOM intact, no gaze preference or deviation, no nystagmus    CN V normal sensation in V1, V2, and V3 segments bilaterally    CN VII L  facial drrop   CN VIII normal hearing to speech    CN IX & X normal palatal elevation, no uvular deviation    CN XI 5/5 head turn and 5/5 shoulder shrug on right. 0/5 on the left.   CN XII midline tongue protrusion   Motor system exam shows dense left hemiplegia with no response to even painful stimulus in the upper extremity and trace withdrawal in the left lower extremity.  This hypotonia and decreased tone on the left.   Gait deferred  ASSESSMENT/PLAN [name] is a 87 y.o. female with history of   hypertension, arthritis, current smoker, cervical degenerative disc with C3-C4 large central disc herniation with early abnormal T2 signal within the cord on recent MRI, hx of bilateral DVTs, recent scattered right MCA territory infarct with high-grade right M1 stenosis in September 2022 on aspirin and Plavix for 3 months followed by aspirin alone who presents with worsening left-sided weakness and found to have expansion of right MCA territory infarct.   She was recently evaluated by our team a couple weeks ago for left-sided weakness and found to have a right MCA infarct with high-grade right M1 stenosis.  She was doing much better and was discharged  back home.  She went to bed at 2030 on 01/23/21 and woke up with weakness on the left. Feels like this has gotten worse since.  She was evaluated by her PCP on tele. Daughter, [name], mentioned systolic blood pressure in 90s from her baseline in 120s-130s. She was told to stop lisinopril and come to the emergency department.     Daughter reports confusion about aspirin and they thought aspirin was for pain. She has not been taking aspirin regularly since discharge. Has been taking plavix daily though.   Daughter also reports that patient has cut down smoking significantly and only smokes 1-2 cigarettes every other day.   Work-up here with an MRI brain without contrast demonstrate expansion of the scattered right MCA infarct.  Stroke:  right  mca infarct embolic secondary to large vessel disease -high-grade right middle cerebral artery stenosis. CT head No acute abnormality.  Small vessel disease. Atrophy.  ASPECTS 10.     CTA head & neck  no large vessel occlusion. Severe high grade stenosis at the distal right M1 segment/right mca bifurcation.   MRI  BRAIN:  Acute infarction affecting the deep and subcortical white matter of the right hemisphere, primarily in the frontal and parietal region with a punctate focus in the deep insular white matter. This pattern/distribution suggests watershed infarction, though there does appear to be flow within the right internal carotid artery. MRA ANGIO HEAD:  1. Short segment area of signal loss in the distal M1 segment of the right MCA, possibly a short segment occlusion. 2. Otherwise normal intracranial MRA.    2D Echo  01/05/2021: lvef 60-65%, NO LVH, left atrial size was normal, no IA shunt   LDL 53 HgbA1c 5.7 VTE prophylaxis - scd     Diet   Diet Heart Room service appropriate? Yes; Fluid consistency: Thin   aspirin 325 mg daily and clopidogrel 75 mg daily prior to admission, now on aspirin 81 mg daily and Brilinta (ticagrelor) 90 mg bid.   Therapy recommendations: Skilled nursing facility  disposition:  pending  Hypertension Home meds:  none Stable Permissive hypertension (OK if < 220/120) but gradually normalize in 5-7 days Long-term BP goal normotensive  Hyperlipidemia Home meds:  lipitor,  resumed in hospital LDL 53, goal < 70  High intensity statin   Continue statin at discharge  HgbA1c 5.7, goal < 7.0 CBGs No results for input(s): GLUCAP in the last 72 hours.  SSI  Other Stroke Risk Factors  Advanced Age >/= 53   Cigarette smoker advised to stop smoking  Hx stroke/TIA  Coronary artery disease    Other Active Problems    Hospital day # 1  Patient with recent right MCA infarct due to symptomatic right M1 stenosis presents with worsening of deficits due to  extension of recent infarct.  Patient is not a candidate for aggressive treatment with angioplasty stenting given her advanced age and poor baseline functioning.  Patient has not been taking aspirin also on Plavix alone.  Recommend aspirin 81 and Brilinta 90 mg twice daily for 3 months followed by aspirin alone and ongoing aggressive risk factor modification.  Therapy evaluation recommended SNF for rehabilitation..  Stroke team will sign off.  Kindly call for questions.  Discussed with Dr. Ouida Sills greater than 50% time during this 25-minute visit was spent on counseling and coordination of care about her stroke and answering questions.  Hayley Contras, MD Medical Director [facility] St[facility] Pager: [phone] 01/27/2021 12:59 PM   To contact  Stroke Continuity provider, please refer to [email or web address] After hours, contact General Neurology

## 2021-01-28 ENCOUNTER — Ambulatory Visit: Payer: Self-pay | Admitting: *Deleted

## 2021-01-28 ENCOUNTER — Ambulatory Visit: Payer: Medicare Other

## 2021-01-28 DIAGNOSIS — E669 Obesity, unspecified: Secondary | ICD-10-CM

## 2021-01-28 DIAGNOSIS — N179 Acute kidney failure, unspecified: Secondary | ICD-10-CM | POA: Diagnosis not present

## 2021-01-28 DIAGNOSIS — I10 Essential (primary) hypertension: Secondary | ICD-10-CM | POA: Diagnosis not present

## 2021-01-28 LAB — URINALYSIS, ROUTINE W REFLEX MICROSCOPIC
Glucose, UA: NEGATIVE mg/dL
Ketones, ur: NEGATIVE mg/dL
Nitrite: POSITIVE — AB
Protein, ur: 100 mg/dL — AB
Specific Gravity, Urine: 1.015 (ref 1.005–1.030)
pH: 7.5 (ref 5.0–8.0)

## 2021-01-28 LAB — URINALYSIS, MICROSCOPIC (REFLEX): RBC / HPF: >50 RBC/hpf (ref 0–5)

## 2021-01-28 MED ORDER — POLYETHYLENE GLYCOL 3350 17 G PO PACK: 17.0000 g | PACK | Freq: Every day | ORAL | 0 refills | Status: DC

## 2021-01-28 MED ORDER — TICAGRELOR 90 MG PO TABS: 90.0000 mg | ORAL_TABLET | Freq: Two times a day (BID) | ORAL | Status: DC

## 2021-01-28 MED ORDER — ASPIRIN 81 MG PO TBEC: 81.0000 mg | DELAYED_RELEASE_TABLET | Freq: Every day | ORAL | 11 refills | Status: DC

## 2021-01-28 NOTE — Progress Notes (Signed)
Occupational Therapy Treatment Patient Details Name: [name] MRN: [id number] DOB: 1928-10-28 Today's Date: 01/28/2021   History of present illness 87 y.o.  female presents to ED presents with worsening left-sided weakness and found to have expansion of right MCA territory infarct (primarily frontal and parietal with punctate focus deep insular white matter). PMH significant for hypertension, arthritis, R THA, IVC filter, ulnar n transposition, current smoker, cervical degenerative disc with C3-C4 large central disc herniation, hx of bilateral DVTs, recent scattered right MCA territory infarct.   OT comments  Pt making incremental progress with OT goals this session. Pt's family requested that she remain bed level for OT session due to pt transferring to SNF today. Pt requiring max A for bed mobility and assist with exercises on the L sided. Once set up pt is able to complete basic grooming with her RUE, continues to have difficulty engaging LUE without assist. OT will continue to follow acutely.    Recommendations for follow up therapy are one component of a multi-disciplinary discharge planning process, led by the attending physician.  Recommendations may be updated based on patient status, additional functional criteria and insurance authorization.    Follow Up Recommendations  SNF    Equipment Recommendations  None recommended by OT    Recommendations for Other Services      Precautions / Restrictions Precautions Precaution Comments: sacral skin breakdown (between buttocks), LUE/LE weakness/inattention Restrictions Weight Bearing Restrictions: No       Mobility Bed Mobility Overal bed mobility: Needs Assistance Bed Mobility: Rolling Rolling: Max assist;+2 for physical assistance;+2 for safety/equipment         General bed mobility comments: Max A to roll and maintain sidelying.    Transfers Overall transfer level: Needs assistance               General  transfer comment: Deferred due to families request to leave her in the bed this session    Balance Overall balance assessment: Needs assistance                                         ADL either performed or assessed with clinical judgement   ADL Overall ADL's : Needs assistance/impaired     Grooming: Set up;Bed level Grooming Details (indicate cue type and reason): washed her face and hands in bed, using her RUE, unable to use LUE without mod A     Lower Body Bathing: Maximal assistance;+2 for physical assistance;+2 for safety/equipment;Bed level Lower Body Bathing Details (indicate cue type and reason): Max A +2 for cleanup and rolling                       General ADL Comments: Pt remained bed level this session due to request of her daughter [name], stating that she will be leaving here soon today.     Vision       Perception     Praxis      Cognition Arousal/Alertness: Awake/alert Behavior During Therapy: WFL for tasks assessed/performed Overall Cognitive Status: Impaired/Different from baseline Area of Impairment: Attention;Memory;Safety/judgement;Problem solving                   Current Attention Level: Sustained Memory: Decreased short-term memory;Decreased recall of precautions   Safety/Judgement: Decreased awareness of deficits;Decreased awareness of safety   Problem Solving: Slow processing;Decreased initiation General Comments: Pt with slow  processing and responding this session, falling asleep during the session        Exercises Exercises: Other exercises Other Exercises Other Exercises: Pulling forward with BUE to long sit x3, requring max A on L side Other Exercises: cross body pulling, BUE, increased assist on L side   Shoulder Instructions       General Comments skin breakdown between buttocks    Pertinent Vitals/ Pain       Pain Assessment: Faces Faces Pain Scale: Hurts even more Pain Location:  buttocks (between) Pain Descriptors / Indicators: Discomfort;Tender Pain Intervention(s): Limited activity within patient's tolerance;Monitored during session;Repositioned  Home Living                                          Prior Functioning/Environment              Frequency  Min 2X/week        Progress Toward Goals  OT Goals(current goals can now be found in the care plan section)  Progress towards OT goals: Progressing toward goals  Acute Rehab OT Goals Patient Stated Goal: to go to rehab OT Goal Formulation: With patient/family Time For Goal Achievement: 02/09/21 Potential to Achieve Goals: Good ADL Goals Pt Will Perform Upper Body Bathing: with supervision;with set-up;sitting Pt Will Perform Upper Body Dressing: with min assist;sitting Pt Will Transfer to Toilet: with +2 assist;with mod assist;bedside commode Additional ADL Goal #1: Pt will maintain midlene postural control EOB with S in preparation for ADL tasks Additional ADL Goal #2: Pt will demonstrate anticipatory awarenss during ADL tasks  Plan Discharge plan remains appropriate;Frequency remains appropriate    Co-evaluation                 AM-PAC OT "6 Clicks" Daily Activity     Outcome Measure   Help from another person eating meals?: A Little Help from another person taking care of personal grooming?: A Little Help from another person toileting, which includes using toliet, bedpan, or urinal?: Total Help from another person bathing (including washing, rinsing, drying)?: A Lot Help from another person to put on and taking off regular upper body clothing?: A Lot Help from another person to put on and taking off regular lower body clothing?: Total 6 Click Score: 12    End of Session    OT Visit Diagnosis: Unsteadiness on feet (R26.81);Other abnormalities of gait and mobility (R26.89);Muscle weakness (generalized) (M62.81);Low vision, both eyes (H54.2);Other symptoms and  signs involving cognitive function;Hemiplegia and hemiparesis Hemiplegia - Right/Left: Left Hemiplegia - dominant/non-dominant: Non-Dominant Hemiplegia - caused by: Cerebral infarction   Activity Tolerance Patient limited by lethargy   Patient Left in bed;with call bell/phone within reach;with family/visitor present   Nurse Communication Mobility status        Time: 4627-0350 OT Time Calculation (min): 24 min  Charges: OT General Charges $OT Visit: 1 Visit OT Treatments $Self Care/Home Management : 8-22 mins $Therapeutic Activity: 8-22 mins  Lloyd Ayo H., OTR/L Acute Rehabilitation  Davie Claud Elane Johnthan Axtman 01/28/2021, 5:18 PM

## 2021-01-28 NOTE — Plan of Care (Signed)
  Problem: Education: Goal: Knowledge of disease or condition will improve Outcome: Progressing Goal: Knowledge of secondary prevention will improve Outcome: Progressing   

## 2021-01-28 NOTE — TOC Transition Note (Signed)
Transition of Care [facility]) - CM/SW Discharge Note   Patient Details  Name: AVILA ALBRITTON MRN: [id number] Date of Birth: [birth year]  Transition of Care [facility]) CM/SW Contact:  Geralynn Ochs, LCSW Phone Number: 01/28/2021, 3:29 PM   Clinical Narrative:   Nurse to call report to [phone], Room 124.    Final next level of care: Skilled Nursing Facility Barriers to Discharge: Barriers Resolved   Patient Goals and CMS Choice   CMS Medicare.gov Compare Post Acute Care list provided to:: Patient Represenative (must comment) Choice offered to / list presented to : Adult Children  Discharge Placement              Patient chooses bed at: [facility] Patient to be transferred to facility by: [place] Name of family member notified: [name] Patient and family notified of of transfer: 01/28/21  Discharge Plan and Services In-house Referral: Clinical Social Work Discharge Planning Services: CM Consult Post Acute Care Choice: [place]                               Social Determinants of Health (SDOH) Interventions     Readmission Risk Interventions No flowsheet data found.

## 2021-01-28 NOTE — Progress Notes (Signed)
Assisted patient with completing Health care POA/AD.  AD was notarized and copy giving to patients nurse to make copies for Pt. And family members.  Also provided information sharing and emotional and spiritual support.  Chaplain available as needed.  Jaclynn Major, [place], [facility], Pager [phone]

## 2021-01-28 NOTE — Progress Notes (Signed)
NURSING PROGRESS NOTE  [name] [id number] Discharge Data: 01/28/2021 4:23 PM Attending Provider: Richarda Osmond, MD EPP:IRJJOAC, Christean Grief, MD     [name] to be D/C'd Skilled nursing facility per MD order.  Discussed with the nurse at [facility] and rehab the After Visit Summary and all questions fully answered. All IV's discontinued with no bleeding noted. All belongings returned to patient for patient to take with her to the facility.  Last Vital Signs:  Blood pressure (!) 130/52, pulse 84, temperature 98.3 F (36.8 C), temperature source Oral, resp. rate 17, height 5\' 8"  (1.727 m), weight 95 kg, SpO2 100 %.  Discharge Medication List Allergies as of 01/28/2021       Reactions   Codeine Other (See Comments)   Passed out   Penicillins Other (See Comments)   Passed out Has patient had a PCN reaction causing immediate rash, facial/tongue/throat swelling, SOB or lightheadedness with hypotension: Yes Has patient had a PCN reaction causing severe rash involving mucus membranes or skin necrosis: Unk Has patient had a PCN reaction that required hospitalization: No Has patient had a PCN reaction occurring within the last 10 years: No If all of the above answers are "NO", then may proceed with Cephalosporin use.        Medication List     STOP taking these medications    clopidogrel 75 MG tablet Commonly known as: PLAVIX   lisinopril-hydrochlorothiazide 20-12.5 MG tablet Commonly known as: ZESTORETIC       TAKE these medications    acetaminophen 325 MG tablet Commonly known as: TYLENOL Take 2 tablets (650 mg total) by mouth every 4 (four) hours as needed for mild pain (or temp > 37.5 C (99.5 F)).   albuterol 108 (90 Base) MCG/ACT inhaler Commonly known as: VENTOLIN HFA Inhale 1-2 puffs into the lungs every 6 (six) hours as needed for wheezing or shortness of breath.   aspirin 81 MG EC tablet Take 1 tablet (81 mg total) by mouth daily. Swallow  whole. Start taking on: January 29, 2021 What changed:  medication strength how much to take additional instructions   atorvastatin 20 MG tablet Commonly known as: LIPITOR Take 1 tablet (20 mg total) by mouth daily.   diclofenac sodium 1 % Gel Commonly known as: VOLTAREN Apply 1 application topically 4 (four) times daily as needed (knee and hand pain).   ondansetron 4 MG disintegrating tablet Commonly known as: ZOFRAN-ODT Take 4 mg by mouth every 8 (eight) hours as needed for nausea or vomiting.   polyethylene glycol 17 g packet Commonly known as: MIRALAX / GLYCOLAX Take 17 g by mouth daily. Start taking on: January 29, 2021   ticagrelor 90 MG Tabs tablet Commonly known as: BRILINTA Take 1 tablet (90 mg total) by mouth 2 (two) times daily.   traMADol 50 MG tablet Commonly known as: ULTRAM Take 50 mg by mouth 2 (two) times daily as needed (pain).

## 2021-01-28 NOTE — Progress Notes (Signed)
   01/28/21 1030  Clinical Encounter Type  Visited With Patient;Family;Patient and family together  Visit Type Initial;Psychological support;Social support;Spiritual support;Other (Comment) (Advance Directive)  Referral From Nurse;Social work  Consult/Referral To Chaplain    [place] visited pt. per page from [place] and per [place]; pt. lying in bed awake with dtr. Barbara at bedside.  Dtr. shared pt. is scheduled for discharge to [facility] for rehab today and requested assistance w/AD for pt.  AD already completed but [place] reviewed document with pt. and dtr.  Pt. initially had left Living Will indicating full scope of life-prolonging treatment but upon further discussion pt. shared that while she definitely would want life-prolonging measures attempted in general (and was concerned initially that indicating otherwise on AD would mean she would not receive treatment), if curative efforts had already been attempted to no avail and pt. was not getting better pt. would want her HCPOA to approve transition to comfort care.  Pt. does want feeding tube left in if other measures were discontinued and indicated this by initialing #2 on Living Will.  Pt. indicates HCPOAs are to follow document.  Appointment with notary initialized for this afternoon; chaplain staff will follow up.  Lindaann Pascal, Chaplain Pager: [phone]

## 2021-01-28 NOTE — Discharge Summary (Signed)
Physician Discharge Summary  PORSHIA BLIZZARD ZOX:[id number] DOB: 09/01/1928 DOA: 01/25/2021  PCP: Nolene Ebbs, MD  Admit date: 01/25/2021 Discharge date: 01/28/2021  Admitted From: home Disposition:  SNF  Recommendations for Outpatient Follow-up:  Follow up with PCP to monitor for urinary symptoms (she did not have any symptoms while admitted). Urinalysis on dc showed positive nitrites, leukocytes, large hgb. Microscopy showed >50 RBCs. This was not a clean catch but was sent for culture in setting of gross hematuria. Recommend repeat urinalysis on follow up as well as chemistries to monitor kidney function. Consider urology referral if continues to have hematuria.  Monitor blood pressure as patient was discontinued from lisinopril. Neurology follow up from stroke.  Discharge Condition:stable CODE STATUS:full Diet recommendation: regular  Brief/Interim Summary: Pt admitted with new stroke resulting in worsening left sided weakness. She remained stable throughout admission and would need to have SNF level care on discharge with new disability which family was supportive of and plan to take her home when she regains strength.  Neurology recommended brillenta BID for 90 days plus aspirin, followed by aspirin alone daily.  Additionally, patient had AKI on presentation thought to be related to hypoperfusion/hypovolemia which resolved with IV fluids and holding of her lisinopril. Discontinued lisinopril on dc. On day of discharge, patient's urine had red tinge so repeated urinalysis which was normal on admission.   Discharge Diagnoses:  Principal Problem:   Acute CVA (cerebrovascular accident) [facility]) Active Problems:   Essential hypertension   Acute kidney injury ([place])  Allergies as of 01/28/2021       Reactions   Codeine Other (See Comments)   Passed out   Penicillins Other (See Comments)   Passed out Has patient had a PCN reaction causing immediate rash, facial/tongue/throat  swelling, SOB or lightheadedness with hypotension: Yes Has patient had a PCN reaction causing severe rash involving mucus membranes or skin necrosis: Unk Has patient had a PCN reaction that required hospitalization: No Has patient had a PCN reaction occurring within the last 10 years: No If all of the above answers are "NO", then may proceed with Cephalosporin use.        Medication List     STOP taking these medications    clopidogrel 75 MG tablet Commonly known as: PLAVIX   lisinopril-hydrochlorothiazide 20-12.5 MG tablet Commonly known as: ZESTORETIC       TAKE these medications    acetaminophen 325 MG tablet Commonly known as: TYLENOL Take 2 tablets (650 mg total) by mouth every 4 (four) hours as needed for mild pain (or temp > 37.5 C (99.5 F)).   albuterol 108 (90 Base) MCG/ACT inhaler Commonly known as: VENTOLIN HFA Inhale 1-2 puffs into the lungs every 6 (six) hours as needed for wheezing or shortness of breath.   aspirin 81 MG EC tablet Take 1 tablet (81 mg total) by mouth daily. Swallow whole. Start taking on: January 29, 2021 What changed:  medication strength how much to take additional instructions   atorvastatin 20 MG tablet Commonly known as: LIPITOR Take 1 tablet (20 mg total) by mouth daily.   diclofenac sodium 1 % Gel Commonly known as: VOLTAREN Apply 1 application topically 4 (four) times daily as needed (knee and hand pain).   ondansetron 4 MG disintegrating tablet Commonly known as: ZOFRAN-ODT Take 4 mg by mouth every 8 (eight) hours as needed for nausea or vomiting.   polyethylene glycol 17 g packet Commonly known as: MIRALAX / GLYCOLAX Take 17 g by mouth daily.  Start taking on: January 29, 2021   ticagrelor 90 MG Tabs tablet Commonly known as: BRILINTA Take 1 tablet (90 mg total) by mouth 2 (two) times daily.   traMADol 50 MG tablet Commonly known as: ULTRAM Take 50 mg by mouth 2 (two) times daily as needed (pain).         Contact information for after-discharge care     Destination     [facility] Preferred SNF .   Service: Skilled Nursing Contact information: [address] [address] [phone]                    Allergies  Allergen Reactions   Codeine Other (See Comments)    Passed out   Penicillins Other (See Comments)    Passed out Has patient had a PCN reaction causing immediate rash, facial/tongue/throat swelling, SOB or lightheadedness with hypotension: Yes Has patient had a PCN reaction causing severe rash involving mucus membranes or skin necrosis: Unk Has patient had a PCN reaction that required hospitalization: No Has patient had a PCN reaction occurring within the last 10 years: No If all of the above answers are "NO", then may proceed with Cephalosporin use.    Consultations: neurology  Procedures/Studies: CT Angio Head W or Wo Contrast  Result Date: 01/06/2021 CLINICAL DATA:  Initial evaluation for neuro deficit, stroke suspected. EXAM: CT ANGIOGRAPHY HEAD AND NECK TECHNIQUE: Multidetector CT imaging of the head and neck was performed using the standard protocol during bolus administration of intravenous contrast. Multiplanar CT image reconstructions and MIPs were obtained to evaluate the vascular anatomy. Carotid stenosis measurements (when applicable) are obtained utilizing NASCET criteria, using the distal internal carotid diameter as the denominator. CONTRAST:  35mL OMNIPAQUE IOHEXOL 350 MG/ML SOLN COMPARISON:  Head CT from 01/05/2021. FINDINGS: CTA NECK FINDINGS Aortic arch: Visualized aortic arch normal in caliber with normal branch pattern. Mild for age atheromatous change about the aortic arch. No stenosis about the origin of the great vessels. Right carotid system: Right CCA tortuous proximally but is widely patent to the bifurcation. No significant atheromatous narrowing about the right carotid bulb. Right ICA widely patent without  stenosis dissection or occlusion. Left carotid system: Left CCA tortuous proximally but is widely patent to the bifurcation. No significant atheromatous narrowing about the left carotid bulb. Left ICA widely patent distally without stenosis, dissection or occlusion. Vertebral arteries: Both vertebral arteries arise from the subclavian arteries. Left vertebral artery slightly dominant. No proximal subclavian artery stenosis. Vertebral arteries patent without stenosis dissection or occlusion. Skeleton: No visible acute osseous finding. No discrete or worrisome osseous lesions. Other neck: No other acute soft tissue abnormality within the neck. Few hypodense left thyroid nodules noted, largest of which measures 2.1 cm. In the setting of significant comorbidities or limited life expectancy, no follow-up recommended (ref: J Am Coll Radiol. 2015 Feb;12(2): 143-50).No other mass or adenopathy. Chronic paranasal sinus disease noted. Upper chest: Visualized upper chest demonstrates no acute finding. Review of the MIP images confirms the above findings CTA HEAD FINDINGS Anterior circulation: Petrous segments patent bilaterally. Mild for age atheromatous change within the carotid siphons without stenosis. A1 segments widely patent. Normal anterior communicating artery complex. Anterior cerebral arteries patent to their distal aspects without stenosis. Left M1 segment widely patent. Normal left MCA bifurcation. Distal left MCA branches widely patent and well perfused. Right M1 segment patent proximally. There is a severe high-grade stenosis at the distal right M1 segment/right MCA bifurcation (series 9, image  20). This is just beyond the takeoff of a small anterior temporal branch. Right MCA branches patent distally. Posterior circulation: Both V4 segments patent to the vertebrobasilar junction without stenosis. Left PICA origin patent. Right PICA not seen. Basilar patent to its distal aspect without stenosis. Superior cerebral  arteries patent bilaterally. Left PCA supplied via the basilar. Right PCA supplied via the basilar as well as a robust right posterior communicating artery. Both PCAs patent to their distal aspects without stenosis. Venous sinuses: Grossly patent allowing for timing the contrast bolus. Anatomic variants: None significant.  No aneurysm. Review of the MIP images confirms the above findings IMPRESSION: 1. Negative CTA for large vessel occlusion. 2. Severe high-grade stenosis at the distal right M1 segment/right MCA bifurcation. 3. Relatively minor atheromatous disease for patient age elsewhere about the major arterial vasculature of the head and neck. No other hemodynamically significant or correctable stenosis. 4. Chronic paranasal sinusitis. Electronically Signed   By: Jeannine Boga M.D.   On: 01/06/2021 04:24   CT HEAD WO CONTRAST (5MM)  Result Date: 01/25/2021 CLINICAL DATA:  Left-sided weakness, facial droop EXAM: CT HEAD WITHOUT CONTRAST TECHNIQUE: Contiguous axial images were obtained from the base of the skull through the vertex without intravenous contrast. COMPARISON:  01/05/2021 FINDINGS: Brain: Age related volume loss. Extensive diffuse bilateral low-density throughout the deep white matter, likely chronic small vessel disease. No acute intracranial abnormality. Specifically, no hemorrhage, hydrocephalus, mass lesion, acute infarction, or significant intracranial injury. Vascular: No hyperdense vessel or unexpected calcification. Skull: No acute calvarial abnormality. Sinuses/Orbits: Mucosal thickening throughout the paranasal sinuses. No air-fluid levels. Other: None IMPRESSION: Extensive chronic small vessel disease. No acute intracranial abnormality. Electronically Signed   By: Rolm Baptise M.D.   On: 01/25/2021 17:11   CT HEAD WO CONTRAST (5MM)  Result Date: 01/05/2021 CLINICAL DATA:  Recent falls today with headaches and neck pain, initial encounter EXAM: CT HEAD WITHOUT CONTRAST CT  CERVICAL SPINE WITHOUT CONTRAST TECHNIQUE: Multidetector CT imaging of the head and cervical spine was performed following the standard protocol without intravenous contrast. Multiplanar CT image reconstructions of the cervical spine were also generated. COMPARISON:  09/09/2018 FINDINGS: CT HEAD FINDINGS Brain: No evidence of acute infarction, hemorrhage, hydrocephalus, extra-axial collection or mass lesion/mass effect. Chronic atrophic and ischemic changes are noted Vascular: No hyperdense vessel or unexpected calcification. Skull: Normal. Negative for fracture or focal lesion. Sinuses/Orbits: No acute finding. Other: None. CT CERVICAL SPINE FINDINGS Alignment: Stable straightening of the normal cervical lordosis. Skull base and vertebrae: 7 cervical segments are well visualized. Vertebral body height is well maintained. Osteophytic changes are noted predominately from C5-C7. No acute fracture or acute facet abnormality is noted. Multilevel facet hypertrophic changes are seen. Soft tissues and spinal canal: Surrounding soft tissue structures are well visualized. No acute abnormality is noted. There are 2 adjacent hypodense lesions in the left lobe of the thyroid. The largest of these measures 2.0 cm. Upper chest: Visualized lung apices are within normal limits. Other: None IMPRESSION: CT of the head: Chronic atrophic and ischemic changes. No acute abnormality noted. CT of the cervical spine: Multilevel degenerative change. Hypodense thyroid nodules in the left lobe of the liver as described. No follow-up recommended unless clinically warranted (ref: J Am Coll Radiol. 2015 Feb;12(2): 143-50). Electronically Signed   By: Inez Catalina M.D.   On: 01/05/2021 22:28   CT Angio Neck W and/or Wo Contrast  Result Date: 01/06/2021 CLINICAL DATA:  Initial evaluation for neuro deficit, stroke suspected. EXAM: CT ANGIOGRAPHY  HEAD AND NECK TECHNIQUE: Multidetector CT imaging of the head and neck was performed using the  standard protocol during bolus administration of intravenous contrast. Multiplanar CT image reconstructions and MIPs were obtained to evaluate the vascular anatomy. Carotid stenosis measurements (when applicable) are obtained utilizing NASCET criteria, using the distal internal carotid diameter as the denominator. CONTRAST:  1mL OMNIPAQUE IOHEXOL 350 MG/ML SOLN COMPARISON:  Head CT from 01/05/2021. FINDINGS: CTA NECK FINDINGS Aortic arch: Visualized aortic arch normal in caliber with normal branch pattern. Mild for age atheromatous change about the aortic arch. No stenosis about the origin of the great vessels. Right carotid system: Right CCA tortuous proximally but is widely patent to the bifurcation. No significant atheromatous narrowing about the right carotid bulb. Right ICA widely patent without stenosis dissection or occlusion. Left carotid system: Left CCA tortuous proximally but is widely patent to the bifurcation. No significant atheromatous narrowing about the left carotid bulb. Left ICA widely patent distally without stenosis, dissection or occlusion. Vertebral arteries: Both vertebral arteries arise from the subclavian arteries. Left vertebral artery slightly dominant. No proximal subclavian artery stenosis. Vertebral arteries patent without stenosis dissection or occlusion. Skeleton: No visible acute osseous finding. No discrete or worrisome osseous lesions. Other neck: No other acute soft tissue abnormality within the neck. Few hypodense left thyroid nodules noted, largest of which measures 2.1 cm. In the setting of significant comorbidities or limited life expectancy, no follow-up recommended (ref: J Am Coll Radiol. 2015 Feb;12(2): 143-50).No other mass or adenopathy. Chronic paranasal sinus disease noted. Upper chest: Visualized upper chest demonstrates no acute finding. Review of the MIP images confirms the above findings CTA HEAD FINDINGS Anterior circulation: Petrous segments patent bilaterally.  Mild for age atheromatous change within the carotid siphons without stenosis. A1 segments widely patent. Normal anterior communicating artery complex. Anterior cerebral arteries patent to their distal aspects without stenosis. Left M1 segment widely patent. Normal left MCA bifurcation. Distal left MCA branches widely patent and well perfused. Right M1 segment patent proximally. There is a severe high-grade stenosis at the distal right M1 segment/right MCA bifurcation (series 9, image 20). This is just beyond the takeoff of a small anterior temporal branch. Right MCA branches patent distally. Posterior circulation: Both V4 segments patent to the vertebrobasilar junction without stenosis. Left PICA origin patent. Right PICA not seen. Basilar patent to its distal aspect without stenosis. Superior cerebral arteries patent bilaterally. Left PCA supplied via the basilar. Right PCA supplied via the basilar as well as a robust right posterior communicating artery. Both PCAs patent to their distal aspects without stenosis. Venous sinuses: Grossly patent allowing for timing the contrast bolus. Anatomic variants: None significant.  No aneurysm. Review of the MIP images confirms the above findings IMPRESSION: 1. Negative CTA for large vessel occlusion. 2. Severe high-grade stenosis at the distal right M1 segment/right MCA bifurcation. 3. Relatively minor atheromatous disease for patient age elsewhere about the major arterial vasculature of the head and neck. No other hemodynamically significant or correctable stenosis. 4. Chronic paranasal sinusitis. Electronically Signed   By: Jeannine Boga M.D.   On: 01/06/2021 04:24   CT Cervical Spine Wo Contrast  Result Date: 01/05/2021 CLINICAL DATA:  Recent falls today with headaches and neck pain, initial encounter EXAM: CT HEAD WITHOUT CONTRAST CT CERVICAL SPINE WITHOUT CONTRAST TECHNIQUE: Multidetector CT imaging of the head and cervical spine was performed following the  standard protocol without intravenous contrast. Multiplanar CT image reconstructions of the cervical spine were also generated. COMPARISON:  09/09/2018  FINDINGS: CT HEAD FINDINGS Brain: No evidence of acute infarction, hemorrhage, hydrocephalus, extra-axial collection or mass lesion/mass effect. Chronic atrophic and ischemic changes are noted Vascular: No hyperdense vessel or unexpected calcification. Skull: Normal. Negative for fracture or focal lesion. Sinuses/Orbits: No acute finding. Other: None. CT CERVICAL SPINE FINDINGS Alignment: Stable straightening of the normal cervical lordosis. Skull base and vertebrae: 7 cervical segments are well visualized. Vertebral body height is well maintained. Osteophytic changes are noted predominately from C5-C7. No acute fracture or acute facet abnormality is noted. Multilevel facet hypertrophic changes are seen. Soft tissues and spinal canal: Surrounding soft tissue structures are well visualized. No acute abnormality is noted. There are 2 adjacent hypodense lesions in the left lobe of the thyroid. The largest of these measures 2.0 cm. Upper chest: Visualized lung apices are within normal limits. Other: None IMPRESSION: CT of the head: Chronic atrophic and ischemic changes. No acute abnormality noted. CT of the cervical spine: Multilevel degenerative change. Hypodense thyroid nodules in the left lobe of the liver as described. No follow-up recommended unless clinically warranted (ref: J Am Coll Radiol. 2015 Feb;12(2): 143-50). Electronically Signed   By: Inez Catalina M.D.   On: 01/05/2021 22:28   MR ANGIO HEAD WO CONTRAST  Result Date: 01/25/2021 CLINICAL DATA:  Syncope and left-sided weakness EXAM: MRA HEAD WITHOUT CONTRAST TECHNIQUE: Angiographic images of the Circle of Willis were acquired using MRA technique without intravenous contrast. COMPARISON:  No pertinent prior exam. FINDINGS: POSTERIOR CIRCULATION: --Vertebral arteries: Normal --Inferior cerebellar  arteries: Normal. --Basilar artery: Normal. --Superior cerebellar arteries: Normal. --Posterior cerebral arteries: Normal. ANTERIOR CIRCULATION: --Intracranial internal carotid arteries: Normal. --Anterior cerebral arteries (ACA): Normal. --Middle cerebral arteries (MCA): There is a short segment area of signal loss within the distal M1 segment of the right MCA. Otherwise normal. ANATOMIC VARIANTS: None IMPRESSION: 1. Short segment area of signal loss in the distal M1 segment of the right MCA, possibly a short segment occlusion. 2. Otherwise normal intracranial MRA. Electronically Signed   By: Ulyses Jarred M.D.   On: 01/25/2021 23:32   MR BRAIN WO CONTRAST  Result Date: 01/25/2021 CLINICAL DATA:  Neurological deficit, acute, stroke suspected. Left-sided weakness. EXAM: MRI HEAD WITHOUT CONTRAST TECHNIQUE: Multiplanar, multiecho pulse sequences of the brain and surrounding structures were obtained without intravenous contrast. COMPARISON:  Head CT earlier same day. FINDINGS: Brain: Diffusion imaging shows acute infarction within the right hemisphere extensively affecting the subcortical white matter of the frontal and parietal regions, with a focal subcortical white matter infarction of the deep insula. This pattern suggests watershed infarction. Minimal chronic small-vessel ischemic changes affect the pons. No focal cerebellar finding. Cerebral hemispheres otherwise show chronic small-vessel ischemic changes of the deep and subcortical white matter of both hemispheres. There is an old small right parietal cortical and subcortical infarction. No evidence of mass, hemorrhage, hydrocephalus or extra-axial collection. Vascular: Major vessels at the base of the brain show flow. Skull and upper cervical spine: Negative Sinuses/Orbits: Mucosal inflammatory changes of the paranasal sinuses. Orbits negative. Other: None IMPRESSION: Acute infarction affecting the deep and subcortical white matter of the right  hemisphere, primarily in the frontal and parietal region with a punctate focus in the deep insular white matter. This pattern/distribution suggests watershed infarction, though there does appear to be flow within the right internal carotid artery. Background pattern of chronic small vessel ischemic changes of the cerebral hemispheric white matter. Old small right parietal cortical infarction. Electronically Signed   By: Jan Fireman.D.  On: 01/25/2021 18:40   MR BRAIN WO CONTRAST  Result Date: 01/06/2021 CLINICAL DATA:  Neuro deficit, acute, stroke suspected. EXAM: MRI HEAD WITHOUT CONTRAST TECHNIQUE: Multiplanar, multiecho pulse sequences of the brain and surrounding structures were obtained without intravenous contrast. COMPARISON:  CT studies same day. FINDINGS: Brain: Diffusion imaging shows extensive patchy infarction in the right parietal lobe and to a lesser extent the posterior frontal lobe, consistent with embolic infarctions in the right MCA territory. No mass effect or hemorrhage. Elsewhere, chronic small-vessel ischemic changes affect pons. No focal abnormality affects the cerebellum. Cerebral hemispheres show extensive chronic small-vessel ischemic changes throughout the white matter. No hydrocephalus or extra-axial collection. Vascular: Major vessels at the base of the brain show flow. Skull and upper cervical spine: Negative Sinuses/Orbits: Mucosal inflammatory changes throughout the paranasal sinuses. Hypodense scratch that hypointense material within the sphenoid sinus that could indicate fungal sinusitis. Other: None IMPRESSION: Extensive patchy infarction in the right parietal lobe and to a lesser extent the posterior frontal lobe consistent with embolic infarction in the right MCA territory. No mass effect or hemorrhage. Extensive chronic small-vessel ischemic changes elsewhere, especially the cerebral hemispheric white matter. Sinus inflammatory disease with widespread mucosal thickening.  Hypointense material filling the sphenoid sinus suggests fungal sinusitis. Electronically Signed   By: Nelson Chimes M.D.   On: 01/06/2021 19:26   MR Cervical Spine Wo Contrast  Result Date: 01/06/2021 CLINICAL DATA:  Spinal stenosis. EXAM: MRI CERVICAL SPINE WITHOUT CONTRAST TECHNIQUE: Multiplanar, multisequence MR imaging of the cervical spine was performed. No intravenous contrast was administered. COMPARISON:  CT same day. FINDINGS: The study suffers from considerable motion degradation. Alignment: Straightening of the normal cervical lordosis. Vertebrae: No fracture or focal bone lesion. Cord: See below regarding stenosis. Posterior Fossa, vertebral arteries, paraspinal tissues: See results of brain MRI. Soft tissues of the neck appear unremarkable. Disc levels: Foramen magnum is widely patent.  C1-2 and C2-3 are unremarkable. C3-4: Central disc herniation effaces the ventral subarachnoid space and indents the cord. AP diameter of the canal in the midline 6.2 mm. Early abnormal T2 signal within the cord. No foraminal encroachment. C4-5: Small central disc herniation effaces the ventral subarachnoid space but does not deform the cord. AP diameter of the canal 8.2 mm. No foraminal stenosis. C5-6: Shallow disc protrusion indents the ventral subarachnoid space but does not affect cord. AP diameter of the canal 11 mm. No foraminal stenosis. C6-7: Mild noncompressive disc bulge. C7-T1: Normal interspace. 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 AP diameter of the canal 6.2 mm. Early abnormal T2 signal within the cord. Small central disc herniation at C4-5 indents the subarachnoid space but does not deform the cord or cause compressive stenosis. Non-compressive disc bulges at C5-6 and C6-7. Electronically Signed   By: Nelson Chimes M.D.   On: 01/06/2021 19:39   DG Chest Portable 1 View  Result Date: 01/25/2021 CLINICAL DATA:   Left-sided weakness and facial droop. EXAM: PORTABLE CHEST 1 VIEW COMPARISON:  November 07, 2009 FINDINGS: Very mild atelectasis is seen within the bilateral lung bases. There is no evidence of acute infiltrate, pleural effusion or pneumothorax. The heart size and mediastinal contours are within normal limits. The visualized skeletal structures are unremarkable. IMPRESSION: Very mild bibasilar atelectasis. Electronically Signed   By: Virgina Norfolk M.D.   On: 01/25/2021 17:56   ECHOCARDIOGRAM COMPLETE  Result Date: 01/07/2021    ECHOCARDIOGRAM REPORT   Patient Name:  [name] Date of Exam: 01/07/2021 Medical Rec #:  [id number]        Height:       68.0 in Accession #:    [id number]       Weight:       214.3 lb Date of Birth:  [birth year]        BSA:          2.105 m Patient Age:    87 years         BP:           131/63 mmHg Patient Gender: F                HR:           62 bpm. Exam Location:  Inpatient Procedure: 2D Echo, Cardiac Doppler and Color Doppler Indications:    CVA  History:        Patient has no prior history of Echocardiogram examinations.                 Signs/Symptoms:Syncope; Risk Factors:Hypertension.  Sonographer:    [place] Referring Phys: [id number] [place]  1. Left ventricular ejection fraction, by estimation, is 60 to 65%. The left ventricle has normal function. The left ventricle has no regional wall motion abnormalities. Left ventricular diastolic parameters are consistent with Grade I diastolic dysfunction (impaired relaxation).  2. Right ventricular systolic function is normal. The right ventricular size is normal.  3. The mitral valve is normal in structure. No evidence of mitral valve regurgitation. No evidence of mitral stenosis.  4. The aortic valve is tricuspid. Aortic valve regurgitation is not visualized. No aortic stenosis is present.  5. The inferior vena cava is normal in size with greater than 50% respiratory variability, suggesting right atrial pressure of 3  mmHg. FINDINGS  Left Ventricle: Left ventricular ejection fraction, by estimation, is 60 to 65%. The left ventricle has normal function. The left ventricle has no regional wall motion abnormalities. The left ventricular internal cavity size was normal in size. There is  no left ventricular hypertrophy. Left ventricular diastolic parameters are consistent with Grade I diastolic dysfunction (impaired relaxation). Right Ventricle: The right ventricular size is normal. Right ventricular systolic function is normal. Left Atrium: Left atrial size was normal in size. Right Atrium: Right atrial size was normal in size. Pericardium: There is no evidence of pericardial effusion. Mitral Valve: The mitral valve is normal in structure. No evidence of mitral valve regurgitation. No evidence of mitral valve stenosis. Tricuspid Valve: The tricuspid valve is normal in structure. Tricuspid valve regurgitation is trivial. No evidence of tricuspid stenosis. Aortic Valve: The aortic valve is tricuspid. Aortic valve regurgitation is not visualized. No aortic stenosis is present. Aortic valve mean gradient measures 3.0 mmHg. Aortic valve peak gradient measures 5.9 mmHg. Pulmonic Valve: The pulmonic valve was not well visualized. Pulmonic valve regurgitation is not visualized. No evidence of pulmonic stenosis. Aorta: The aortic root is normal in size and structure. Venous: The inferior vena cava is normal in size with greater than 50% respiratory variability, suggesting right atrial pressure of 3 mmHg. IAS/Shunts: No atrial level shunt detected by color flow Doppler.  LEFT VENTRICLE PLAX 2D LVIDd:         4.20 cm     Diastology LVIDs:         2.90 cm     LV e' medial:    6.42 cm/s LV PW:  1.20 cm     LV E/e' medial:  11.3 LV IVS:        1.10 cm     LV e' lateral:   6.96 cm/s LVOT diam:     2.40 cm     LV E/e' lateral: 10.4 LV SV:         127 LV SV Index:   60 LVOT Area:     4.52 cm  LV Volumes (MOD) LV vol d, MOD A4C: 40.9 ml LV  vol s, MOD A4C: 9.4 ml LV SV MOD A4C:     40.9 ml RIGHT VENTRICLE             IVC RV S prime:     11.40 cm/s  IVC diam: 1.30 cm TAPSE (M-mode): 2.1 cm LEFT ATRIUM             Index       RIGHT ATRIUM           Index LA diam:        2.60 cm 1.24 cm/m  RA Area:     15.50 cm LA Vol (A2C):   44.2 ml 21.00 ml/m RA Volume:   39.90 ml  18.96 ml/m LA Vol (A4C):   14.8 ml 7.03 ml/m LA Biplane Vol: 28.2 ml 13.40 ml/m  AORTIC VALVE                   PULMONIC VALVE AV Area (Vmax):    3.65 cm    PV Vmax:       0.57 m/s AV Area (Vmean):   3.42 cm    PV Peak grad:  1.3 mmHg AV Area (VTI):     4.76 cm AV Vmax:           121.00 cm/s AV Vmean:          83.000 cm/s AV VTI:            0.266 m AV Peak Grad:      5.9 mmHg AV Mean Grad:      3.0 mmHg LVOT Vmax:         97.50 cm/s LVOT Vmean:        62.800 cm/s LVOT VTI:          0.280 m LVOT/AV VTI ratio: 1.05  AORTA Ao Root diam: 2.90 cm Ao Asc diam:  3.30 cm MITRAL VALVE MV Area (PHT): 1.91 cm     SHUNTS MV E velocity: 72.70 cm/s   Systemic VTI:  0.28 m MV A velocity: 100.00 cm/s  Systemic Diam: 2.40 cm MV E/A ratio:  0.73 Kirk Ruths MD Electronically signed by Kirk Ruths MD Signature Date/Time: 01/07/2021/2:38:42 PM    Final    VAS US CAROTID  Result Date: 01/26/2021 Carotid Arterial Duplex Study Patient Name:  MADALYNN PICKELSIMER  Date of Exam:   01/26/2021 Medical Rec #: [id number]         Accession #:    [id number] Date of Birth: 1928-09-11         Patient Gender: F Patient Age:   62 years Exam Location:  [facility] Procedure:      VAS US CAROTID Referring Phys: Alferd Patee [facility] --------------------------------------------------------------------------------  Indications:       CVA. Risk Factors:      Hypertension, current smoker, prior CVA. Comparison Study:  CTA on 01/06/2021 WNL Performing Technologist: Rogelia Rohrer RVT, RDMS  Examination Guidelines: A complete evaluation includes B-mode imaging, spectral Doppler, color Doppler, and  power Doppler as needed  of all accessible portions of each vessel. Bilateral testing is considered an integral part of a complete examination. Limited examinations for reoccurring indications may be performed as noted.  Right Carotid Findings: +----------+--------+--------+--------+------------------+--------+           PSV cm/sEDV cm/sStenosisPlaque DescriptionComments +----------+--------+--------+--------+------------------+--------+ CCA Prox  100     11                                tortuous +----------+--------+--------+--------+------------------+--------+ CCA Distal66      9                                          +----------+--------+--------+--------+------------------+--------+ ICA Prox  56      15                                         +----------+--------+--------+--------+------------------+--------+ ICA Distal53      13                                         +----------+--------+--------+--------+------------------+--------+ ECA       42      3                                          +----------+--------+--------+--------+------------------+--------+ +----------+--------+-------+----------------+-------------------+           PSV cm/sEDV cmsDescribe        Arm Pressure (mmHG) +----------+--------+-------+----------------+-------------------+ Subclavian122     108    Multiphasic, WNL                    +----------+--------+-------+----------------+-------------------+ +---------+--------+--+--------+-+---------+ VertebralPSV cm/s46EDV cm/s5Antegrade +---------+--------+--+--------+-+---------+  Left Carotid Findings: +----------+--------+--------+--------+------------------+--------+           PSV cm/sEDV cm/sStenosisPlaque DescriptionComments +----------+--------+--------+--------+------------------+--------+ CCA Prox  111     7                                 tortuous +----------+--------+--------+--------+------------------+--------+ CCA  Distal71      9                                          +----------+--------+--------+--------+------------------+--------+ ICA Prox  85      15                                         +----------+--------+--------+--------+------------------+--------+ ICA Distal83      14                                         +----------+--------+--------+--------+------------------+--------+ ECA       50                                                 +----------+--------+--------+--------+------------------+--------+ +----------+--------+--------+----------------+-------------------+  PSV cm/sEDV cm/sDescribe        Arm Pressure (mmHG) +----------+--------+--------+----------------+-------------------+ [id number]             Multiphasic, WNL                    +----------+--------+--------+----------------+-------------------+ +---------+--------+--+--------+-+---------+ VertebralPSV cm/s31EDV cm/s6Antegrade +---------+--------+--+--------+-+---------+   Summary: Right Carotid: The extracranial vessels were near-normal with only minimal wall                thickening or plaque. Vertebrals:  Bilateral vertebral arteries demonstrate antegrade flow. Subclavians: Normal flow hemodynamics were seen in bilateral subclavian              arteries. *See table(s) above for measurements and observations.  Electronically signed by Antony Contras MD on 01/26/2021 at 12:47:36 PM.    Final     Subjective: Patient has no complaints or concerns today. Her two daughters are at bedside with a lot of questions which were all addressed prior to discharge.   Discharge Exam: Vitals:   01/28/21 0308 01/28/21 0822  BP: (!) 122/56 (!) 135/55  Pulse: 71 81  Resp: 17 18  Temp: 97.6 F (36.4 C) 98.9 F (37.2 C)  SpO2: 100% 99%    General: Pt is alert, awake, not in acute distress Cardiovascular: RRR, S1/S2 +, no rubs, no gallops Respiratory: normal respiratory effort GU: urine has  pink tinge Abdominal: Soft, NT, ND Extremities: no edema, no cyanosis  Labs: Basic Metabolic Panel: Recent Labs  Lab 01/25/21 1520 01/26/21 0351 01/27/21 0354  NA 134* 135 137  K 5.0 4.2 4.4  CL 101 103 107  CO2 20* 19* 22  GLUCOSE 111* 115* 114*  BUN 59* 59* 45*  CREATININE 1.73* 1.64* 1.26*  CALCIUM 9.6 9.3 8.9   CBC: Recent Labs  Lab 01/25/21 1520  WBC 8.7  NEUTROABS 6.1  HGB 14.3  HCT 44.9  MCV 98.7  PLT 194    Microbiology Recent Results (from the past 240 hour(s))  Resp Panel by RT-PCR (Flu A&B, Covid) Nasopharyngeal Swab     Status: None   Collection Time: 01/25/21  3:01 PM   Specimen: Nasopharyngeal Swab; Nasopharyngeal(NP) swabs in vial transport medium  Result Value Ref Range Status   SARS Coronavirus 2 by RT PCR NEGATIVE NEGATIVE Final    Comment: (NOTE) SARS-CoV-2 target nucleic acids are NOT DETECTED.  The SARS-CoV-2 RNA is generally detectable in upper respiratory specimens during the acute phase of infection. The lowest concentration of SARS-CoV-2 viral copies this assay can detect is 138 copies/mL. A negative result does not preclude SARS-Cov-2 infection and should not be used as the sole basis for treatment or other patient management decisions. A negative result may occur with  improper specimen collection/handling, submission of specimen other than nasopharyngeal swab, presence of viral mutation(s) within the areas targeted by this assay, and inadequate number of viral copies(<138 copies/mL). A negative result must be combined with clinical observations, patient history, and epidemiological information. The expected result is Negative.  Fact Sheet for Patients:  EntrepreneurPulse.com.au  Fact Sheet for Healthcare Providers:  IncredibleEmployment.be  This test is no t yet approved or cleared by the [place] FDA and  has been authorized for detection and/or diagnosis of SARS-CoV-2 by FDA under an  Emergency Use Authorization (EUA). This EUA will remain  in effect (meaning this test can be used) for the duration of the COVID-19 declaration under Section 564(b)(1) of the Act, 21 U.S.C.section 360bbb-3(b)(1), unless the authorization is terminated  or  revoked sooner.       Influenza A by PCR NEGATIVE NEGATIVE Final   Influenza B by PCR NEGATIVE NEGATIVE Final    Comment: (NOTE) The Xpert Xpress SARS-CoV-2/FLU/RSV plus assay is intended as an aid in the diagnosis of influenza from Nasopharyngeal swab specimens and should not be used as a sole basis for treatment. Nasal washings and aspirates are unacceptable for Xpert Xpress SARS-CoV-2/FLU/RSV testing.  Fact Sheet for Patients: EntrepreneurPulse.com.au  Fact Sheet for Healthcare Providers: IncredibleEmployment.be  This test is not yet approved or cleared by the [place] FDA and has been authorized for detection and/or diagnosis of SARS-CoV-2 by FDA under an Emergency Use Authorization (EUA). This EUA will remain in effect (meaning this test can be used) for the duration of the COVID-19 declaration under Section 564(b)(1) of the Act, 21 U.S.C. section 360bbb-3(b)(1), unless the authorization is terminated or revoked.  Performed at [facility], [place] [address], [place], [place] [address]   Resp Panel by RT-PCR (Flu A&B, Covid) Nasopharyngeal Swab     Status: None   Collection Time: 01/27/21  5:00 PM   Specimen: Nasopharyngeal Swab; Nasopharyngeal(NP) swabs in vial transport medium  Result Value Ref Range Status   SARS Coronavirus 2 by RT PCR NEGATIVE NEGATIVE Final    Comment: (NOTE) SARS-CoV-2 target nucleic acids are NOT DETECTED.  The SARS-CoV-2 RNA is generally detectable in upper respiratory specimens during the acute phase of infection. The lowest concentration of SARS-CoV-2 viral copies this assay can detect is 138 copies/mL. A negative result does not preclude  SARS-Cov-2 infection and should not be used as the sole basis for treatment or other patient management decisions. A negative result may occur with  improper specimen collection/handling, submission of specimen other than nasopharyngeal swab, presence of viral mutation(s) within the areas targeted by this assay, and inadequate number of viral copies(<138 copies/mL). A negative result must be combined with clinical observations, patient history, and epidemiological information. The expected result is Negative.  Fact Sheet for Patients:  EntrepreneurPulse.com.au  Fact Sheet for Healthcare Providers:  IncredibleEmployment.be  This test is no t yet approved or cleared by the [place] FDA and  has been authorized for detection and/or diagnosis of SARS-CoV-2 by FDA under an Emergency Use Authorization (EUA). This EUA will remain  in effect (meaning this test can be used) for the duration of the COVID-19 declaration under Section 564(b)(1) of the Act, 21 U.S.C.section 360bbb-3(b)(1), unless the authorization is terminated  or revoked sooner.       Influenza A by PCR NEGATIVE NEGATIVE Final   Influenza B by PCR NEGATIVE NEGATIVE Final    Comment: (NOTE) The Xpert Xpress SARS-CoV-2/FLU/RSV plus assay is intended as an aid in the diagnosis of influenza from Nasopharyngeal swab specimens and should not be used as a sole basis for treatment. Nasal washings and aspirates are unacceptable for Xpert Xpress SARS-CoV-2/FLU/RSV testing.  Fact Sheet for Patients: EntrepreneurPulse.com.au  Fact Sheet for Healthcare Providers: IncredibleEmployment.be  This test is not yet approved or cleared by the [place] FDA and has been authorized for detection and/or diagnosis of SARS-CoV-2 by FDA under an Emergency Use Authorization (EUA). This EUA will remain in effect (meaning this test can be used) for the duration of  the COVID-19 declaration under Section 564(b)(1) of the Act, 21 U.S.C. section 360bbb-3(b)(1), unless the authorization is terminated or revoked.  Performed at [facility], [place] [address], [place], [place] [address]     Time coordinating discharge:  Over 30 minutes  Richarda Osmond, MD  [facility] 01/28/2021, 12:36 PM Pager   If 7PM-7AM, please contact night-coverage [email or web address] Password [email or web address]

## 2021-01-29 ENCOUNTER — Ambulatory Visit: Payer: Medicare Other

## 2021-01-30 LAB — URINE CULTURE: Culture: >=100000 — AB

## 2021-02-03 ENCOUNTER — Ambulatory Visit: Payer: Medicare Other | Admitting: Occupational Therapy

## 2021-02-03 ENCOUNTER — Ambulatory Visit: Payer: Medicare Other | Admitting: Physical Therapy

## 2021-02-05 ENCOUNTER — Encounter: Payer: Medicare Other | Admitting: Occupational Therapy

## 2021-02-05 ENCOUNTER — Ambulatory Visit: Payer: Medicare Other

## 2021-02-09 ENCOUNTER — Encounter: Payer: Medicare Other | Admitting: Occupational Therapy

## 2021-02-09 ENCOUNTER — Ambulatory Visit: Payer: Medicare Other | Admitting: Physical Therapy

## 2021-02-12 ENCOUNTER — Encounter: Payer: Medicare Other | Admitting: Occupational Therapy

## 2021-02-12 ENCOUNTER — Ambulatory Visit: Payer: Medicare Other

## 2021-02-17 ENCOUNTER — Encounter: Payer: Medicare Other | Admitting: Occupational Therapy

## 2021-02-17 ENCOUNTER — Ambulatory Visit: Payer: Medicare Other | Admitting: Physical Therapy

## 2021-02-19 ENCOUNTER — Ambulatory Visit: Payer: Medicare Other

## 2021-02-19 ENCOUNTER — Encounter: Payer: Medicare Other | Admitting: Occupational Therapy

## 2021-02-23 ENCOUNTER — Ambulatory Visit: Payer: Medicare Other | Admitting: Physical Therapy

## 2021-02-23 ENCOUNTER — Encounter: Payer: Medicare Other | Admitting: Occupational Therapy

## 2021-02-24 NOTE — Progress Notes (Signed)
[facility] [address] [place]. [place] [address] [phone]       HOSPITAL FOLLOW UP NOTE  Ms. [name] Date of Birth:  1928/04/29 Medical Record Number:  [id number]   Reason for Referral:  hospital stroke follow up    SUBJECTIVE:   CHIEF COMPLAINT:  Chief Complaint  Patient presents with   Follow-up    Rm 3 with daughter here for hospital follow up- Recently d/c from rehab had 2 strokes one in September and October. Reports rehab was beneficial with restoring some strength.     HPI:   [name] is a 87 year old female with history of smoking, hypertension, cervical DDD, bilateral DVTs admitted on 01/05/2021 for bilateral hand weakness, numbness and the left leg weakness.  Personally reviewed hospitalization pertinent progress notes, lab work and imaging.  However patient only complaint to be that she had bilateral hand palmar side of finger numbness and denies any leg weakness.  CT no acute abnormality, CTA head and neck right M1 high-grade stenosis.  MRI showed right MCA territory scattered infarcts.  MRI C-spine showed C3/4 severe spinal stenosis and mild spinal cord compression.  EF 60 to 65%, LDL 53, A1c 5.7, UDS negative.  Creatinine 1.00.  Etiology for stroke likely due to large vessel disease with right distal MCA high-grade stenosis.  Recommended DAPT for 3 months then aspirin alone and start atorvastatin 20 mg daily. Current tobacco user smoking cessation counseling provided.  In regards to b/l finger sensation deficit, likely due to severe spinal stenosis and mild spinal cord compression - eval by neurosurg -no surgical intervention recommended and advised follow-up outpatient.  PT/OT recommended outpatient PT/OT for residual left-sided weakness and left-sided neglect and discharged home on 01/08/2021.   Readmitted on 01/25/2021 for worsening left-sided weakness upon awakening on 10/9. Per chart review, eval by PCP on tele with daughter  mentioning systolic BP 25K from baseline 120s-130s - was advised to stop lisinopril and proceed to ED. Eval by Dr. Leonie Man with MRI showing expansion of scattered right MCA infarcts. Apparently confusion regarding aspirin - daughter thought this was for pain and not taking regularly since recent discharge although has been compliant on Plavix.  Not a candidate for aggressive treatment with angioplasty stenting given advanced age and poor baseline functioning.  Recommended aspirin 81 mg daily and Brilinta 90 mg twice daily for 3 months followed by aspirin alone and aggressive risk factor modification. Also tx for AKI possibly in setting of hypoperfusion/hypovolemia which improved after IV fluids and holding lisinopril.  Therapy eval recommended SNF for acute rehab.     Today, 02/25/2021, patient being seen for initial hospital follow-up accompanied by her daughter  Recently discharged from [facility] rehab 11/6 and returned back home with daughter.  She was living with daughter PTA and completely independent Residual left sided weakness making some improvement since discharge.  eval PT/SLP beginning of the week and OT tomorrow. PT 2x/week, cleared by SLP Not ambulatory.  Transfers via wheelchair No swallowing issues per daughter.  Cognition at baseline - appears age appropriate  Denies new stroke/TIA symptoms  During SNF admission, was found to have LLE DVT - started on Eliquis 5mg  twice daily.  Brilinta discontinued and remained on aspirin.  Denies any associated side effects.  Also remains on atorvastatin without side effects. Blood pressure today 122/64 Appt with PCP scheduled 11/15 Reports complete tobacco cessation since admission in September  Pt does become tearful today when asking about her stroke and recovery Daughter  questions ongoing use of Percocet and gabapentin which was rx'd during SNF admission   No further concerns at this time     PERTINENT IMAGING   MRI  BRAIN: 01/06/2021 IMPRESSION:  -Extensive patchy infarction in the right parietal lobe and to a lesser extent the posterior frontal lobe consistent with embolic infarction in the right MCA territory. No mass effect or hemorrhage.  -Extensive chronic small-vessel ischemic changes elsewhere, especially the cerebral hemispheric white matter. Sinus inflammatory disease with widespread mucosal thickening. -Hypointense material filling the sphenoid sinus suggests fungal sinusitis.   01/25/2021 IMPRESSION: Acute infarction affecting the deep and subcortical white matter of the right hemisphere, primarily in the frontal and parietal region with a punctate focus in the deep insular white matter. This pattern/distribution suggests watershed infarction, though there does appear to be flow within the right internal carotid artery.   Background pattern of chronic small vessel ischemic changes of the cerebral hemispheric white matter. Old small right parietal cortical infarction.   CTA HEAD/NECK 01/06/2021 IMPRESSION IMPRESSION: 1. Negative CTA for large vessel occlusion. 2. Severe high-grade stenosis at the distal right M1 segment/right MCA bifurcation. 3. Relatively minor atheromatous disease for patient age elsewhere about the major arterial vasculature of the head and neck. No other hemodynamically significant or correctable stenosis. 4. Chronic paranasal sinusitis.   MR CERVICAL SPINE 01/06/2021 IMPRESSION: Large central disc herniation at C3-4 effaces the ventral subarachnoid space and indents the ventral cord slightly. AP diameter of the canal 6.2 mm. Early abnormal T2 signal within the cord.   Small central disc herniation at C4-5 indents the subarachnoid space but does not deform the cord or cause compressive stenosis.   Non-compressive disc bulges at C5-6 and C6-7.   2D ECHO 01/07/2021 IMPRESSIONS   1. Left ventricular ejection fraction, by estimation, is 60 to 65%. The   left ventricle has normal function. The left ventricle has no regional  wall motion abnormalities. Left ventricular diastolic parameters are  consistent with Grade I diastolic  dysfunction (impaired relaxation).   2. Right ventricular systolic function is normal. The right ventricular  size is normal.   3. The mitral valve is normal in structure. No evidence of mitral valve  regurgitation. No evidence of mitral stenosis.   4. The aortic valve is tricuspid. Aortic valve regurgitation is not  visualized. No aortic stenosis is present.   5. The inferior vena cava is normal in size with greater than 50%  respiratory variability, suggesting right atrial pressure of 3 mmHg.        ROS:   14 system review of systems performed and negative with exception of those listed in HPI  PMH:  Past Medical History:  Diagnosis Date   Allergic rhinitis    Arthritis    Carpal tunnel syndrome    Deep venous thrombosis (HCC)    bilateral legs   Degenerative arthritis    right knee   Hypertension    Lumbar degenerative disc disease    Lump or mass in breast    Paresthesia    Syncope 05/24/2013   Vitamin D deficiency     PSH:  Past Surgical History:  Procedure Laterality Date   ABDOMINAL HYSTERECTOMY     CARPAL TUNNEL RELEASE Right    CATARACT EXTRACTION Bilateral    HEMORROIDECTOMY     IVC Filter     TOTAL HIP ARTHROPLASTY Right    ULNAR NERVE TRANSPOSITION Right     Social History:  Social History   Socioeconomic History  Marital status: Widowed    Spouse name: Not on file   Number of children: 3   Years of education: 81   Highest education level: Not on file  Occupational History   Occupation: retired  Tobacco Use   Smoking status: Every Day    Packs/day: 10.00    Years: 30.00    Pack years: 300.00    Types: Cigarettes   Smokeless tobacco: Never  Substance and Sexual Activity   Alcohol use: Yes    Comment: occasional   Drug use: No   Sexual activity: Not on file   Other Topics Concern   Not on file  Social History Narrative   Not on file   Social Determinants of Health   Financial Resource Strain: Not on file  Food Insecurity: Not on file  Transportation Needs: Not on file  Physical Activity: Not on file  Stress: Not on file  Social Connections: Not on file  Intimate Partner Violence: Not on file    Family History:  Family History  Problem Relation Age of Onset   Diabetes Mother    Kidney failure Father    Cancer - Lung Brother    Cancer - Prostate Brother    Kidney failure Son     Medications:   Current Outpatient Medications on File Prior to Visit  Medication Sig Dispense Refill   albuterol (VENTOLIN HFA) 108 (90 Base) MCG/ACT inhaler Inhale 1-2 puffs into the lungs every 6 (six) hours as needed for wheezing or shortness of breath.     apixaban (ELIQUIS) 5 MG TABS tablet Take 5 mg by mouth 2 (two) times daily.     aspirin EC 81 MG EC tablet Take 1 tablet (81 mg total) by mouth daily. Swallow whole. 30 tablet 11   atorvastatin (LIPITOR) 20 MG tablet Take 1 tablet (20 mg total) by mouth daily. 30 tablet 3   diclofenac (VOLTAREN) 0.1 % ophthalmic solution 4 (four) times daily.     diclofenac sodium (VOLTAREN) 1 % GEL Apply 1 application topically 4 (four) times daily as needed (knee and hand pain).     gabapentin (NEURONTIN) 300 MG capsule Take 300 mg by mouth at bedtime.     oxyCODONE-acetaminophen (PERCOCET) 7.5-325 MG tablet Take 1 tablet by mouth every 4 (four) hours as needed for severe pain.     No current facility-administered medications on file prior to visit.    Allergies:   Allergies  Allergen Reactions   Codeine Other (See Comments)    Passed out   Penicillins Other (See Comments)    Passed out Has patient had a PCN reaction causing immediate rash, facial/tongue/throat swelling, SOB or lightheadedness with hypotension: Yes Has patient had a PCN reaction causing severe rash involving mucus membranes or skin  necrosis: Unk Has patient had a PCN reaction that required hospitalization: No Has patient had a PCN reaction occurring within the last 10 years: No If all of the above answers are "NO", then may proceed with Cephalosporin use.       OBJECTIVE:  Physical Exam  Vitals:   02/25/21 1313  BP: 122/64  Pulse: 77  SpO2: 99%   There is no height or weight on file to calculate BMI. No results found.  General: well developed, well nourished, very pleasant elderly African-American female, seated, in no evident distress Head: head normocephalic and atraumatic.   Neck: supple with no carotid or supraclavicular bruits Cardiovascular: regular rate and rhythm, no murmurs Musculoskeletal: no deformity Skin:  no rash/petichiae  Vascular:  Normal pulses all extremities   Neurologic Exam Mental Status: Awake and fully alert.  Mild dysarthria.  No evidence of aphasia.  Oriented to place and time. Recent and remote memory intact. Attention span, concentration and fund of knowledge appropriate. Mood and affect appropriate.  Cranial Nerves: Pupils equal, briskly reactive to light. Extraocular movements full without nystagmus. Visual fields full to confrontation. Hearing intact. Facial sensation intact.  Mild left lower facial weakness.  Tongue, and palate moves normally and symmetrically.  Motor: Normal strength, bulk and tone right upper and lower extremity LUE: 2-3/5 proximal and 2/5 hand with increased swelling distally  LLE: some movement but difficulty fully assessing due to pain Sensory.: intact to touch , pinprick , position and vibratory sensation.  Coordination: Rapid alternating movements normal on right side. Finger-to-nose performed accurately RUE and heel-to-shin difficulty performing due to knee pain and LLE weakness Gait and Station: Deferred Reflexes: 1+ and symmetric. Toes downgoing.     NIHSS  6 Modified Rankin  4      ASSESSMENT: [name] is a 87 y.o. year old  female with right MCA territory infarcts (right parietal and posterior frontal lobe) on 01/05/2021 and expansion of right MCA strokes on 01/24/2021 secondary to large vessel disease in setting of high-grade stenosis of right M1. Vascular risk factors include HTN, HLD, advanced age, tobacco use, and intracranial stenosis. Also evaluated during admission for R>L  palmar side of fingers sensory deficit in setting of a C3-C4 large central disc herniation. Dc'd to SNF rehab - during rehab stay, dx'd with LLE DVT and placed on Eliquis      PLAN:  R MCA strokes :  Residual deficit: Left hemiparesis and dysarthria. Start HH therapies next week - once completed, will likely need to transition to outpatient therapies.  Long discussion regarding typical stroke recovery duration Continue Eliquis (apixaban) daily  and atorvastatin 20 mg daily for secondary stroke prevention and evidence of DVT.   Advised to discontinue aspirin as no indication for both Eliquis and aspirin therapy - asa will need to be restarted once Eliquis therapy completed Discussed secondary stroke prevention measures and importance of close PCP follow up for aggressive stroke risk factor management. I have gone over the pathophysiology of stroke, warning signs and symptoms, risk factors and their management in some detail with instructions to go to the closest emergency room for symptoms of concern. LLE DVT: dx'd 01/2021 during SNF rehab stay.  Currently on Eliquis 5 mg twice daily. Will need f/u with PCP for ongoing monitoring and management which was discussed with daughter today HTN: BP goal <130/90.  Stable on stable per PCP HLD: LDL goal <70. Recent LDL 53.  Continue atorvastatin 20 mg daily Tobacco use: Congratulated on continued tobacco cessation and highly encouraged continued avoidance     Follow up in 4 months or call earlier if needed   CC:  [place] provider: Dr. Leonie Man PCP: Nolene Ebbs, MD    I spent 56 minutes of  face-to-face and non-face-to-face time with patient and daughter.  This included previsit chart review including review of recent hospitalization, lab review, study review, electronic health record documentation, patient and daughter education and discussion regarding recent stroke including etiology, secondary stroke prevention measures and importance of managing stroke risk factors, residual deficits and typical recovery time, dx of DVT, typical time frame and therapy and f/u with PCP for monitoring and answered all other questions to patient satisfaction   Frann Rider, AGNP-BC  [facility]  [address] [place], [place] [address]  Phone [phone] Fax [phone] Note: This document was prepared with digital dictation and possible smart phrase technology. Any transcriptional errors that result from this process are unintentional.

## 2021-02-25 ENCOUNTER — Ambulatory Visit (INDEPENDENT_AMBULATORY_CARE_PROVIDER_SITE_OTHER): Payer: Medicare Other | Admitting: Adult Health

## 2021-02-25 ENCOUNTER — Encounter: Payer: Self-pay | Admitting: Adult Health

## 2021-02-25 VITALS — BP 122/64 | HR 77

## 2021-02-25 DIAGNOSIS — I63511 Cerebral infarction due to unspecified occlusion or stenosis of right middle cerebral artery: Secondary | ICD-10-CM | POA: Diagnosis not present

## 2021-02-25 DIAGNOSIS — I82402 Acute embolism and thrombosis of unspecified deep veins of left lower extremity: Secondary | ICD-10-CM

## 2021-02-25 DIAGNOSIS — I10 Essential (primary) hypertension: Secondary | ICD-10-CM

## 2021-02-25 DIAGNOSIS — I69354 Hemiplegia and hemiparesis following cerebral infarction affecting left non-dominant side: Secondary | ICD-10-CM | POA: Diagnosis not present

## 2021-02-25 DIAGNOSIS — E785 Hyperlipidemia, unspecified: Secondary | ICD-10-CM | POA: Diagnosis not present

## 2021-02-25 NOTE — Patient Instructions (Addendum)
Continue Eliquis (apixaban) daily  and atorvastatin for secondary stroke prevention  Can hold aspirin at this time - can be restarted once Eliquis completed  You will follow up with your PCP regarding management of your left leg clot and ongoing use of Eliquis  Continue to follow up with PCP regarding cholesterol and blood pressure management  Maintain strict control of hypertension with blood pressure goal below 130/90 and cholesterol with LDL cholesterol (bad cholesterol) goal below 70 mg/dL.   Start home health therapies - once completed, will likely need to proceed with doing outpatient therapies - pleaes let me know if you need assist with these orders       Followup in the future with me in 4 months or call earlier if needed       Thank you for coming to see [place] at [facility]. I hope we have been able to provide you high quality care today.  You may receive a patient satisfaction survey over the next few weeks. We would appreciate your feedback and comments so that we may continue to improve ourselves and the health of our patients.

## 2021-02-26 ENCOUNTER — Ambulatory Visit: Payer: Medicare Other

## 2021-02-26 ENCOUNTER — Encounter: Payer: Medicare Other | Admitting: Occupational Therapy

## 2021-02-26 NOTE — Progress Notes (Signed)
I agree with the above plan 

## 2021-03-03 ENCOUNTER — Ambulatory Visit: Payer: Medicare Other | Admitting: Physical Therapy

## 2021-03-03 ENCOUNTER — Encounter: Payer: Medicare Other | Admitting: Occupational Therapy

## 2021-03-05 ENCOUNTER — Ambulatory Visit: Payer: Medicare Other

## 2021-03-05 ENCOUNTER — Encounter: Payer: Medicare Other | Admitting: Occupational Therapy

## 2021-04-29 ENCOUNTER — Other Ambulatory Visit: Payer: Self-pay | Admitting: Internal Medicine

## 2021-04-30 LAB — URINE CULTURE
MICRO NUMBER:: 12863156
SPECIMEN QUALITY:: ADEQUATE

## 2021-06-24 ENCOUNTER — Ambulatory Visit (INDEPENDENT_AMBULATORY_CARE_PROVIDER_SITE_OTHER): Payer: Medicare Other | Admitting: Adult Health

## 2021-06-24 ENCOUNTER — Encounter: Payer: Self-pay | Admitting: Adult Health

## 2021-06-24 VITALS — BP 148/69 | HR 64

## 2021-06-24 DIAGNOSIS — I69354 Hemiplegia and hemiparesis following cerebral infarction affecting left non-dominant side: Secondary | ICD-10-CM

## 2021-06-24 DIAGNOSIS — R42 Dizziness and giddiness: Secondary | ICD-10-CM

## 2021-06-24 DIAGNOSIS — I63511 Cerebral infarction due to unspecified occlusion or stenosis of right middle cerebral artery: Secondary | ICD-10-CM | POA: Diagnosis not present

## 2021-06-24 NOTE — Progress Notes (Signed)
[facility] [address] [place] [address] [phone]       HOSPITAL FOLLOW UP NOTE  [name] Date of Birth:  06-01-1928 Medical Record Number:  [id number]   Reason for Referral:  hospital stroke follow up    SUBJECTIVE:   CHIEF COMPLAINT:  Chief Complaint  Patient presents with   Follow-up    RM 3 with daughter [name] Pt is well, daughter states she see's no change since her last visit     HPI:   Update 06/24/2021 [name]: 88 year old female who returns for stroke follow-up after prior visit 4 months ago.  Overall stable without new stroke/TIA symptoms.  Reports residual left sided weakness which has been stable since prior visit. Completed PT/OT back in February, per daughter, plans on re-starting outpatient PT, daughter believes PCP placed order and awaiting phone call.  Patient nonambulatory and transfers via hoyer lift, does have aide assistance 3x weekly.  Daughter does report patient having flare of vertigo over the past week, does have hx of vertigo, describes as a room spinning sensation, lightheadedness and pre-syncope feeling typically present when turning from back on to her side while in bed. Plans on asking PCP for refill for meclizine which was previously helpful. Does endorse chronic tinnitus and hearing loss. Denies changes in vision.  Denies any recent medication changes.  Currently on Eliquis 5 mg twice daily for LLE DVT tx, denies side effects. Has f/u on April 6th with PCP, daughter plans on discussing f/u for DVT at that time.  Remains on atorvastatin, without side effects. PCP checked cholesterol levels last month, unable to see results.  Blood pressure today 148/69. Monitored at home and gets sent straight to PCP office, has been overall stable but can fluctuate, denies any specific low readings.   Continued complete tobacco cessation.  No further concerns at this time      History provided for reference purposes  only Initial visit 02/25/2021 [name]: Patient being seen for initial hospital follow-up accompanied by her daughter  Recently discharged from [facility] rehab 11/6 and returned back home with daughter.  She was living with daughter PTA and completely independent Residual left sided weakness making some improvement since discharge.  eval PT/SLP beginning of the week and OT tomorrow. PT 2x/week, cleared by SLP Not ambulatory.  Transfers via wheelchair No swallowing issues per daughter.  Cognition at baseline - appears age appropriate  Denies new stroke/TIA symptoms  During SNF admission, was found to have LLE DVT - started on Eliquis '5mg'$  twice daily.  Brilinta discontinued and remained on aspirin.  Denies any associated side effects.  Also remains on atorvastatin without side effects. Blood pressure today 122/64 Appt with PCP scheduled 11/15 Reports complete tobacco cessation since admission in September  Pt does become tearful today when asking about her stroke and recovery Daughter questions ongoing use of Percocet and gabapentin which was rx'd during SNF admission   No further concerns at this time   Stroke admissions [name] is a 88 year old female with history of smoking, hypertension, cervical DDD, bilateral DVTs admitted on 01/05/2021 for bilateral hand weakness, numbness and the left leg weakness.  Personally reviewed hospitalization pertinent progress notes, lab work and imaging.  However patient only complaint to be that she had bilateral hand palmar side of finger numbness and denies any leg weakness.  CT no acute abnormality, CTA head and neck right M1 high-grade stenosis.  MRI showed right MCA territory scattered infarcts.  MRI C-spine  showed C3/4 severe spinal stenosis and mild spinal cord compression.  EF 60 to 65%, LDL 53, A1c 5.7, UDS negative.  Creatinine 1.00.  Etiology for stroke likely due to large vessel disease with right distal MCA high-grade stenosis.  Recommended DAPT for 3  months then aspirin alone and start atorvastatin 20 mg daily. Current tobacco user smoking cessation counseling provided.  In regards to b/l finger sensation deficit, likely due to severe spinal stenosis and mild spinal cord compression - eval by neurosurg -no surgical intervention recommended and advised follow-up outpatient.  PT/OT recommended outpatient PT/OT for residual left-sided weakness and left-sided neglect and discharged home on 01/08/2021.   Readmitted on 01/25/2021 for worsening left-sided weakness upon awakening on 10/9. Per chart review, eval by PCP on tele with daughter mentioning systolic BP 97D from baseline 120s-130s - was advised to stop lisinopril and proceed to ED. Eval by Dr. Leonie Man with MRI showing expansion of scattered right MCA infarcts. Apparently confusion regarding aspirin - daughter thought this was for pain and not taking regularly since recent discharge although has been compliant on Plavix.  Not a candidate for aggressive treatment with angioplasty stenting given advanced age and poor baseline functioning.  Recommended aspirin 81 mg daily and Brilinta 90 mg twice daily for 3 months followed by aspirin alone and aggressive risk factor modification. Also tx for AKI possibly in setting of hypoperfusion/hypovolemia which improved after IV fluids and holding lisinopril.  Therapy eval recommended SNF for acute rehab.       PERTINENT IMAGING   MRI BRAIN: 01/06/2021 IMPRESSION:  -Extensive patchy infarction in the right parietal lobe and to a lesser extent the posterior frontal lobe consistent with embolic infarction in the right MCA territory. No mass effect or hemorrhage.  -Extensive chronic small-vessel ischemic changes elsewhere, especially the cerebral hemispheric white matter. Sinus inflammatory disease with widespread mucosal thickening. -Hypointense material filling the sphenoid sinus suggests fungal sinusitis.   01/25/2021 IMPRESSION: Acute infarction affecting  the deep and subcortical white matter of the right hemisphere, primarily in the frontal and parietal region with a punctate focus in the deep insular white matter. This pattern/distribution suggests watershed infarction, though there does appear to be flow within the right internal carotid artery.   Background pattern of chronic small vessel ischemic changes of the cerebral hemispheric white matter. Old small right parietal cortical infarction.   CTA HEAD/NECK 01/06/2021 IMPRESSION IMPRESSION: 1. Negative CTA for large vessel occlusion. 2. Severe high-grade stenosis at the distal right M1 segment/right MCA bifurcation. 3. Relatively minor atheromatous disease for patient age elsewhere about the major arterial vasculature of the head and neck. No other hemodynamically significant or correctable stenosis. 4. Chronic paranasal sinusitis.   MR CERVICAL SPINE 01/06/2021 IMPRESSION: Large central disc herniation at C3-4 effaces the ventral subarachnoid space and indents the ventral cord slightly. AP diameter of the canal 6.2 mm. Early abnormal T2 signal within the cord.   Small central disc herniation at C4-5 indents the subarachnoid space but does not deform the cord or cause compressive stenosis.   Non-compressive disc bulges at C5-6 and C6-7.   2D ECHO 01/07/2021 IMPRESSIONS   1. Left ventricular ejection fraction, by estimation, is 60 to 65%. The  left ventricle has normal function. The left ventricle has no regional  wall motion abnormalities. Left ventricular diastolic parameters are  consistent with Grade I diastolic  dysfunction (impaired relaxation).   2. Right ventricular systolic function is normal. The right ventricular  size is normal.   3. The  mitral valve is normal in structure. No evidence of mitral valve  regurgitation. No evidence of mitral stenosis.   4. The aortic valve is tricuspid. Aortic valve regurgitation is not  visualized. No aortic stenosis is  present.   5. The inferior vena cava is normal in size with greater than 50%  respiratory variability, suggesting right atrial pressure of 3 mmHg.        ROS:   14 system review of systems performed and negative with exception of those listed in HPI  PMH:  Past Medical History:  Diagnosis Date   Allergic rhinitis    Arthritis    Carpal tunnel syndrome    Deep venous thrombosis (HCC)    bilateral legs   Degenerative arthritis    right knee   Hypertension    Lumbar degenerative disc disease    Lump or mass in breast    Paresthesia    Syncope 05/24/2013   Vitamin D deficiency     PSH:  Past Surgical History:  Procedure Laterality Date   ABDOMINAL HYSTERECTOMY     CARPAL TUNNEL RELEASE Right    CATARACT EXTRACTION Bilateral    HEMORROIDECTOMY     IVC Filter     TOTAL HIP ARTHROPLASTY Right    ULNAR NERVE TRANSPOSITION Right     Social History:  Social History   Socioeconomic History   Marital status: Widowed    Spouse name: Not on file   Number of children: 3   Years of education: 49   Highest education level: Not on file  Occupational History   Occupation: retired  Tobacco Use   Smoking status: Every Day    Packs/day: 10.00    Years: 30.00    Pack years: 300.00    Types: Cigarettes   Smokeless tobacco: Never  Substance and Sexual Activity   Alcohol use: Yes    Comment: occasional   Drug use: No   Sexual activity: Not on file  Other Topics Concern   Not on file  Social History Narrative   Not on file   Social Determinants of Health   Financial Resource Strain: Not on file  Food Insecurity: Not on file  Transportation Needs: Not on file  Physical Activity: Not on file  Stress: Not on file  Social Connections: Not on file  Intimate Partner Violence: Not on file    Family History:  Family History  Problem Relation Age of Onset   Diabetes Mother    Kidney failure Father    Cancer - Lung Brother    Cancer - Prostate Brother    Kidney  failure Son     Medications:   Current Outpatient Medications on File Prior to Visit  Medication Sig Dispense Refill   albuterol (VENTOLIN HFA) 108 (90 Base) MCG/ACT inhaler Inhale 1-2 puffs into the lungs every 6 (six) hours as needed for wheezing or shortness of breath.     apixaban (ELIQUIS) 5 MG TABS tablet Take 5 mg by mouth 2 (two) times daily.     atorvastatin (LIPITOR) 20 MG tablet Take 1 tablet (20 mg total) by mouth daily. 30 tablet 3   diclofenac sodium (VOLTAREN) 1 % GEL Apply 1 application topically 4 (four) times daily as needed (knee and hand pain).     escitalopram (LEXAPRO) 10 MG tablet SMARTSIG:1 Tablet(s) By Mouth Every Evening     losartan (COZAAR) 50 MG tablet Take 50 mg by mouth daily.     pregabalin (LYRICA) 100 MG capsule Take 100  mg by mouth 3 (three) times daily.     No current facility-administered medications on file prior to visit.    Allergies:   Allergies  Allergen Reactions   Codeine Other (See Comments)    Passed out   Penicillins Other (See Comments)    Passed out Has patient had a PCN reaction causing immediate rash, facial/tongue/throat swelling, SOB or lightheadedness with hypotension: Yes Has patient had a PCN reaction causing severe rash involving mucus membranes or skin necrosis: Unk Has patient had a PCN reaction that required hospitalization: No Has patient had a PCN reaction occurring within the last 10 years: No If all of the above answers are "NO", then may proceed with Cephalosporin use.       OBJECTIVE:  Physical Exam  Vitals:   06/24/21 1336  BP: (!) 148/69  Pulse: 64    There is no height or weight on file to calculate BMI. No results found.  General: Morbidly obese very pleasant elderly African-American female, seated, in no evident distress Head: head normocephalic and atraumatic.   Neck: supple with no carotid or supraclavicular bruits Cardiovascular: regular rate and rhythm, no murmurs Vascular:  Normal pulses all  extremities   Neurologic Exam Mental Status: Awake and fully alert.  Mild dysarthria.  No evidence of aphasia.  Oriented to place and time. Recent and remote memory intact. Attention span, concentration and fund of knowledge appropriate. Mood and affect appropriate.  Cranial Nerves: Pupils equal, briskly reactive to light. Extraocular movements full without nystagmus. Visual fields full to confrontation. Hearing intact. Facial sensation intact.  Mild left lower facial weakness.  Tongue, and palate moves normally and symmetrically.  Motor: Normal strength, bulk and tone right upper and lower extremity LUE: 23/5 deltoid, 4-/5 bicep and tricep and 2/5 hand grip LLE: some movement but difficulty fully assessing due to hip and knee pain, swelling noted distally Sensory.: intact to touch , pinprick , position and vibratory sensation.  Coordination: Rapid alternating movements normal on right side. Finger-to-nose performed accurately RUE and heel-to-shin difficulty performing bilaterally due to knee pain and LLE weakness Gait and Station: Deferred as nonambulatory Reflexes: 1+ and symmetric. Toes downgoing.         ASSESSMENT: [name] is a 88 y.o. year old female with right MCA territory infarcts (right parietal and posterior frontal lobe) on 01/05/2021 and expansion of right MCA strokes on 01/24/2021 secondary to large vessel disease in setting of high-grade stenosis of right M1. Vascular risk factors include HTN, HLD, advanced age, tobacco use, and intracranial stenosis. Also evaluated during admission for R>L  palmar side of fingers sensory deficit in setting of a C3-C4 large central disc herniation. Dc'd to SNF rehab - during rehab stay, dx'd with LLE DVT and placed on Eliquis      PLAN:  R MCA strokes :  Residual deficit: Left hemiparesis and dysarthria. Completed HH PT. Plans on starting OP PT. Advised to let us know if assistance is needed with referral Continue atorvastatin 20 mg  daily for secondary stroke prevention  Currently on eliquis for DVT - once course completed, recommend transitioning back to aspirin 81 mg daily for secondary stroke prevention measures Discussed secondary stroke prevention measures and importance of close PCP follow up for aggressive stroke risk factor management including HTN with BP goal<130/90 and HLD with LDL goal<70. I have gone over the pathophysiology of stroke, warning signs and symptoms, risk factors and their management in some detail with instructions to go to the closest  emergency room for symptoms of concern. LLE DVT: dx'd 01/2021 during SNF rehab stay.  Currently on Eliquis 5 mg twice daily managed by PCP. Discussed with daughter to f/u with PCP for management and monitoring  Vertigo: chronic issue with flare ups. Difficulty fully distinguishing between vertigo vs presyncope symptoms especially as nonambulatory and symptoms only present when turning in bed which typically is more consistent with BPPV. Recommend working with PT to see if this helps but if not, would consider referral to ENT to evaluate for Mnire's disease with chronic history of tinnitus and hearing loss.     Doing well from stroke standpoint and risk factors are managed by PCP. She may follow up PRN, as usual for our patients who are strictly being followed for stroke. If any new neurological issues should arise, request PCP place referral for evaluation by one of our neurologists. Thank you.      CC:  PCP: Nolene Ebbs, MD    I spent 38 minutes of face-to-face and non-face-to-face time with patient and daughter.  This included previsit chart review, lab review, study review, electronic health record documentation, patient and daughter education and discussion regarding prior stroke including etiology and residual deficits, secondary stroke prevention measures and importance of managing stroke risk factors, dx of DVT, typical time frame and therapy and f/u with  PCP for monitoring, recurrence of vertigo and possible etiologies and answered all other questions to patient satisfaction   Frann Rider, AGNP-BC  [facility] [address] [place], [place] [address]  Phone [phone] Fax [phone] Note: This document was prepared with digital dictation and possible smart phrase technology. Any transcriptional errors that result from this process are unintentional.

## 2021-06-24 NOTE — Patient Instructions (Addendum)
Continue Eliquis (apixaban) daily  for left leg DVT - ongoing duration and management will be done by PCP. Once Eliquis course completed, would recommend transitioning back to aspirin 81 mg daily for secondary stroke prevention measures ? ?Stop in to therapy today to get initial evaluations scheduled - can also discuss vertigo/dizziness sensation with therapy. If symptoms persist, would recommend evaluation with ENT to rule out an inner ear condition such as Meni?res disease.  ? ?Continue atorvastatin for secondary stroke prevention ? ?Continue to follow up with PCP regarding cholesterol and blood pressure management  ?Maintain strict control of hypertension with blood pressure goal below 130/90 and cholesterol with LDL cholesterol (bad cholesterol) goal below 70 mg/dL.  ? ?Signs of a Stroke? Follow the BEFAST method:  ?Balance Watch for a sudden loss of balance, trouble with coordination or vertigo ?Eyes Is there a sudden loss of vision in one or both eyes? Or double vision?  ?Face: Ask the person to smile. Does one side of the face droop or is it numb?  ?Arms: Ask the person to raise both arms. Does one arm drift downward? Is there weakness or numbness of a leg? ?Speech: Ask the person to repeat a simple phrase. Does the speech sound slurred/strange? Is the person confused ? ?Time: If you observe any of these signs, call 911. ? ? ? ? ? ? ? ? ? ?Thank you for coming to see [place] at [facility]. I hope we have been able to provide you high quality care today. ? ?You may receive a patient satisfaction survey over the next few weeks. We would appreciate your feedback and comments so that we may continue to improve ourselves and the health of our patients. ? ? ?Benign Positional Vertigo ?Vertigo is the feeling that you or your surroundings are moving when they are not. Benign positional vertigo is the most common form of vertigo. This is usually a harmless condition (benign). This condition is  positional. This means that symptoms are triggered by certain movements and positions. ?This condition can be dangerous if it occurs while you are doing something that could cause harm to yourself or others. This includes activities such as driving or operating machinery. ?What are the causes? ?The inner ear has fluid-filled canals that help your brain sense movement and balance. When the fluid moves, the brain receives messages about your body's position. ?With benign positional vertigo, calcium crystals in the inner ear break free and disturb the inner ear area. This causes your brain to receive confusing messages about your body's position. ?What increases the risk? ?You are more likely to develop this condition if: ?You are a woman. ?You are 86 years of age or older. ?You have recently had a head injury. ?You have an inner ear disease. ?What are the signs or symptoms? ?Symptoms of this condition usually happen when you move your head or your eyes in different directions. Symptoms may start suddenly and usually last for less than a minute. They include: ?Loss of balance and falling. ?Feeling like you are spinning or moving. ?Feeling like your surroundings are spinning or moving. ?Nausea and vomiting. ?Blurred vision. ?Dizziness. ?Involuntary eye movement (nystagmus). ?Symptoms can be mild and cause only minor problems, or they can be severe and interfere with daily life. Episodes of benign positional vertigo may return (recur) over time. Symptoms may also improve over time. ?How is this diagnosed? ?This condition may be diagnosed based on: ?Your medical history. ?A physical exam of the head, neck, and  ears. ?Positional tests to check for or stimulate vertigo. You may be asked to turn your head and change positions, such as going from sitting to lying down. A health care provider will watch for symptoms of vertigo. ?You may be referred to a health care provider who specializes in ear, nose, and throat problems  (ENT or otolaryngologist) or a provider who specializes in disorders of the nervous system (neurologist). ?How is this treated? ?This condition may be treated in a session in which your health care provider moves your head in specific positions to help the displaced crystals in your inner ear move. Treatment for this condition may take several sessions. Surgery may be needed in severe cases, but this is rare. ?In some cases, benign positional vertigo may resolve on its own in 2-4 weeks. ?Follow these instructions at home: ?Safety ?Move slowly. Avoid sudden body or head movements or certain positions, as told by your health care provider. ?Avoid driving or operating machinery until your health care provider says it is safe. ?Avoid doing any tasks that would be dangerous to you or others if vertigo occurs. ?If you have trouble walking or keeping your balance, try using a cane for stability. If you feel dizzy or unstable, sit down right away. ?Return to your normal activities as told by your health care provider. Ask your health care provider what activities are safe for you. ?General instructions ?Take over-the-counter and prescription medicines only as told by your health care provider. ?Drink enough fluid to keep your urine pale yellow. ?Keep all follow-up visits. This is important. ?Contact a health care provider if: ?You have a fever. ?Your condition gets worse or you develop new symptoms. ?Your family or friends notice any behavioral changes. ?You have nausea or vomiting that gets worse. ?You have numbness or a prickling and tingling sensation. ?Get help right away if you: ?Have difficulty speaking or moving. ?Are always dizzy or faint. ?Develop severe headaches. ?Have weakness in your legs or arms. ?Have changes in your hearing or vision. ?Develop a stiff neck. ?Develop sensitivity to light. ?These symptoms may represent a serious problem that is an emergency. Do not wait to see if the symptoms will go away. Get  medical help right away. Call your local emergency services (911 in the [place] Do not drive yourself to the hospital. ?Summary ?Vertigo is the feeling that you or your surroundings are moving when they are not. Benign positional vertigo is the most common form of vertigo. ?This condition is caused by calcium crystals in the inner ear that become displaced. This causes a disturbance in an area of the inner ear that helps your brain sense movement and balance. ?Symptoms include loss of balance and falling, feeling that you or your surroundings are moving, nausea and vomiting, and blurred vision. ?This condition can be diagnosed based on symptoms, a physical exam, and positional tests. ?Follow safety instructions as told by your health care provider and keep all follow-up visits. This is important. ?This information is not intended to replace advice given to you by your health care provider. Make sure you discuss any questions you have with your health care provider. ?Document Revised: 03/04/2020 Document Reviewed: 03/04/2020 ?Elsevier Patient Education ? [place]. ? ?

## 2021-07-22 ENCOUNTER — Other Ambulatory Visit: Payer: Self-pay | Admitting: Internal Medicine

## 2021-07-25 LAB — URINE CULTURE
MICRO NUMBER:: 13231632
SPECIMEN QUALITY:: ADEQUATE

## 2021-08-03 ENCOUNTER — Ambulatory Visit: Payer: Medicare Other | Attending: Internal Medicine

## 2021-08-03 ENCOUNTER — Ambulatory Visit: Payer: Medicare Other | Admitting: Occupational Therapy

## 2021-08-03 ENCOUNTER — Encounter: Payer: Self-pay | Admitting: Occupational Therapy

## 2021-08-03 VITALS — BP 158/70

## 2021-08-03 DIAGNOSIS — R208 Other disturbances of skin sensation: Secondary | ICD-10-CM | POA: Insufficient documentation

## 2021-08-03 DIAGNOSIS — R2681 Unsteadiness on feet: Secondary | ICD-10-CM

## 2021-08-03 DIAGNOSIS — I69354 Hemiplegia and hemiparesis following cerebral infarction affecting left non-dominant side: Secondary | ICD-10-CM

## 2021-08-03 DIAGNOSIS — R41840 Attention and concentration deficit: Secondary | ICD-10-CM

## 2021-08-03 DIAGNOSIS — M6281 Muscle weakness (generalized): Secondary | ICD-10-CM | POA: Diagnosis present

## 2021-08-03 DIAGNOSIS — R278 Other lack of coordination: Secondary | ICD-10-CM | POA: Insufficient documentation

## 2021-08-03 DIAGNOSIS — R2689 Other abnormalities of gait and mobility: Secondary | ICD-10-CM | POA: Diagnosis present

## 2021-08-03 NOTE — Therapy (Signed)
?OUTPATIENT OCCUPATIONAL THERAPY NEURO EVALUATION ? ?Patient Name: [name] ?MRN: [id number] ?DOB:July 14, 1928, 88 y.o., female, female ?Today's Date: 08/03/2021 ? ?PCP: Hayley Ebbs, MD ?REFERRING PROVIDER: Nolene Ebbs, MD ? ? OT End of Session - 08/03/21 1426   ? ? Visit Number 1   ? Number of Visits 17   ? Date for OT Re-Evaluation 10/02/21   ? Authorization Type UHC Medicare and Medicaid,Covered 100% as long as she's enrolled with Medicaid  VL: Follows Medicare guidelines   ? Progress Note Due on Visit 10   ? OT Start Time 1235   ? OT Stop Time 1320   ? OT Time Calculation (min) 45 min   ? Equipment Utilized During [employer] lift   ? Activity Tolerance Patient tolerated treatment well   ? Behavior During Therapy [facility] for tasks assessed/performed   ? ?  ?  ? ?  ? ? ?Past Medical History:  ?Diagnosis Date  ? Allergic rhinitis   ? Arthritis   ? Carpal tunnel syndrome   ? Deep venous thrombosis ([place])   ? bilateral legs  ? Degenerative arthritis   ? right knee  ? Hypertension   ? Lumbar degenerative disc disease   ? Lump or mass in breast   ? Paresthesia   ? Syncope 05/24/2013  ? Vitamin D deficiency   ? ?Past Surgical History:  ?Procedure Laterality Date  ? ABDOMINAL HYSTERECTOMY    ? CARPAL TUNNEL RELEASE Right   ? CATARACT EXTRACTION Bilateral   ? HEMORROIDECTOMY    ? IVC Filter    ? TOTAL HIP ARTHROPLASTY Right   ? ULNAR NERVE TRANSPOSITION Right   ? ?Patient Active Problem List  ? Diagnosis Date Noted  ? Obesity (BMI 30-39.9) 01/28/2021  ? Acute CVA (cerebrovascular accident) ([place]) 01/25/2021  ? Acute kidney injury ([place]) 01/25/2021  ? CVA (cerebral vascular accident) ([place]) 01/06/2021  ? Essential hypertension 01/06/2021  ? Arthritis 01/06/2021  ? Pain due to onychomycosis of toenails of both feet 12/14/2018  ? Presbycusis of both ears 09/22/2015  ? Syncope 05/24/2013  ? ? ?ONSET DATE: 07/13/21 Referral date ? ?REFERRING DIAG: CVA's ? ?THERAPY DIAG:  ?Hemiplegia and hemiparesis following cerebral infarction  affecting left non-dominant side ([place]) ? ?Muscle weakness (generalized) ? ?Other lack of coordination ? ?Unsteadiness on feet ? ?Other disturbances of skin sensation ? ?Attention and concentration deficit ? ?SUBJECTIVE:  ? ?SUBJECTIVE STATEMENT: ?I want to be able to do more and walk ?Pt accompanied by: family member - daughter [name] ? ?PERTINENT HISTORY: vertigo, smoking, hypertension, cervical DDD, bilateral DVTs  multiple strokes (09/22, 10/22) .  right MCA territory scattered infarcts.  MRI C-spine showed C3/4 severe spinal stenosis and mild spinal cord compression.  EF 60 to 65%  ? ?PRECAUTIONS: Fall ? ?WEIGHT BEARING RESTRICTIONS No ? ?PAIN:  ?Are you having pain? No - Reported pain with active end range flexion in RIGHT shoulder, reported pain with knee flexion RIGHT- 6/10 shoulder and knee - momentary pain ? ?FALLS: Has patient fallen in last 6 months? No ? ?LIVING ENVIRONMENT: ?Lives with: lives with their family ?Lives in: House/apartment ?Single point cane, Quad cane large base, Walker - 2 wheeled, Environmental consultant - 4 wheeled, Wheelchair (manual), Shower bench, and bed side commode ? ?PLOF: Independent with basic ADLs ? ?PATIENT GOALS To be more involved in her own care - to walk ? ?OBJECTIVE:  ? ?HAND DOMINANCE: Right ? ?ADLs: ?Overall ADLs: dependent ?Transfers/ambulation related to ADLs:Hoyer ?Eating: set up and min ?Grooming: min/mod ?UB  Dressing: mod ?LB Dressing: total ?Toileting: total ?Bathing: total ?Tub Shower transfers:  ?Equipment: Transfer tub bench, bed side commode, and hoyer lift ? ? ? ? ?MOBILITY STATUS: wheelchair dependent for several months (since second stroke 10/22) ? ?POSTURE COMMENTS:  ?rounded shoulders, forward head, increased thoracic kyphosis, posterior pelvic tilt, and flexed trunk  ?Sitting balance: Supports self with 25-50% effort using UE, requires therapist's assistance ? ?ACTIVITY TOLERANCE: ?Activity tolerance: Impaired - patient fearful of transitions, holds breath, breathes  audibly - less active, nearly bedbound  ? ? ? ?UE ROM    ? ?Active ROM Right ?08/03/2021 Left ?08/03/2021  ?Shoulder flexion 120 30 AAROM, 0 arom  ?Shoulder abduction 110   ?Shoulder adduction    ?Shoulder extension    ?Shoulder internal rotation  15  ?Shoulder external rotation  0  ?Elbow flexion WFL trace  ?Elbow extension WFL trace  ?Wrist flexion  trace  ?Wrist extension  trace  ?Wrist ulnar deviation    ?Wrist radial deviation    ?Wrist pronation    ?Wrist supination    ?(Blank rows = not tested) ? ? ?UE MMT:   Not tested due to pain and atypical muscle tension ? ? ?HAND FUNCTION: ?Grip strength: Right: TBD lbs; Left: TBD lbs ? ?COORDINATION: ?Patient with little volitional grasp/release in LUE ? ?SENSATION: ?Light touch: WFL per report to gross testing ? ?EDEMA: Dorsum of hand over MCP, and wrist mild ? ?MUSCLE TONE: LUE: Mild and Hypotonic ? ?COGNITION: ?Overall cognitive status: Impaired: Memory: Deficits reports loss of time, day versus night, flow of day ? ?VISION: ?Subjective report: Weas readers ?Baseline vision: Wears glasses for reading only ?Visual history:  Did not obtain - patient reports no changes to vision with strokes ? ?VISION ASSESSMENT: ?WFL ? ? ?PERCEPTION: Impaired: Inattention/neglect: does not attend to left side of body and Spatial orientation: midline awareness - strong posterior right bias in sitting, fearful of rolling (H/O Vestibular imapirment - on meclizene) ? ?PRAXIS: Not tested ? ? ?TODAY'S TREATMENT:  ?08/03/21:  Assessed unsupported sitting balance while PT was present to assist.  Patient able to maintain static position x 3-5 seconds before falling backward toward right. ? ? ?PATIENT EDUCATION: ?Education details: results of OT evaluation and plan of care ?Person educated: Patient and Child(ren) ?Education method: Explanation ?Education comprehension: verbalized understanding ? ? ?HOME EXERCISE PROGRAM: ?TBD ? ? ? ?GOALS: ?Goals reviewed with patient? No ? ?SHORT TERM GOALS:  Target date: 08/31/2021 ? ?Patient and caregiver will complete an HEP designed to improve active range of motion in LUE ?Baseline: Stretching program ?Goal status: INITIAL ? ?2.  Patient will demonstrate 30% composite flexion in digits ?Baseline: 20% then painful ?Goal status: INITIAL ? ?3.  Patient will sit at edge of mat table with intermittent min assistance x 30 seconds in preparation for active participation in ADL ?Baseline: 3-5 sec  ?Goal status: INITIAL ? ?4.  Patient will lean forward while seated at edge of mat as if reaching toward shoes/socks - reach past knees 2/3 down length of calf ?Baseline: just below knee ?Goal status: INITIAL ? ?5.  Patient will wash her face, chest and left arm after set up assistance ?Baseline: Dependent ?Goal status: INITIAL ? ? ?LONG TERM GOALS: Target date: 09/28/2021 ? ?Patient will complete home activity program designed to improve functional use of RUE in simple self care tasks ?Baseline: No HAP ?Goal status: INITIAL ? ?2.  Patient will report pain no greater than 3/10 in right shoulder with active reach ?Baseline: 6/10 ?  Goal status: INITIAL ? ?3.  Patient will complete an active level surface transfer with max assist ?Baseline: dependent- hoyer ?Goal status: INITIAL ? ?4.  Patient will assist with pulling up and down lower body clothing to reduce caregiver burden for dressing ?Baseline: Dependent ?Goal status: INITIAL ? ?5.  Patient will demonstrate at least 50% composite flexion in left hand to aide with grasp/release ?Baseline: 20% flex ?Goal status: INITIAL ? ? ? ?ASSESSMENT: ? ?CLINICAL IMPRESSION: ?Patient is a 88 y.o. wheelchair bound female who was seen today for occupational therapy evaluation for assessment of her ability to more actively participate in her basic self care skills following two subsequent R MCA strokes in Sept and Oct 2022.  ? ?PERFORMANCE DEFICITS in functional skills including ADLs, IADLs, coordination, dexterity, proprioception, sensation, edema,  tone, ROM, strength, pain, fascial restrictions, muscle spasms, flexibility, FMC, GMC, mobility, balance, body mechanics, endurance, cardiopulmonary status limiting function, decreased knowledge of use of

## 2021-08-03 NOTE — Therapy (Signed)
?OUTPATIENT PHYSICAL THERAPY NEURO EVALUATION ? ? ?Patient Name: [name] ?MRN: [id number] ?DOB:[birth year], 88 y.o., female ?Today's Date: 08/04/2021 ? ?PCP: Nolene Ebbs, MD ?REFERRING PROVIDER: Nolene Ebbs, MD ? ? PT End of Session - 08/03/21 1148   ? ? Visit Number 1   ? Number of Visits 17   ? Date for PT Re-Evaluation 10/01/21   ? Authorization Type UHC medicare so 10th visit progress note   ? PT Start Time 1145   ? PT Stop Time 6073   overlapped with OT some for 2 person assist/assessment  ? PT Time Calculation (min) 60 min   ? Equipment Utilized During Treatment Other (comment)   Hoyer  ? Activity Tolerance Patient tolerated treatment well   ? Behavior During Therapy [facility] for tasks assessed/performed   ? ?  ?  ? ?  ? ? ?Past Medical History:  ?Diagnosis Date  ? Allergic rhinitis   ? Arthritis   ? Carpal tunnel syndrome   ? Deep venous thrombosis ([place])   ? bilateral legs  ? Degenerative arthritis   ? right knee  ? Hypertension   ? Lumbar degenerative disc disease   ? Lump or mass in breast   ? Paresthesia   ? Syncope 05/24/2013  ? Vitamin D deficiency   ? ?Past Surgical History:  ?Procedure Laterality Date  ? ABDOMINAL HYSTERECTOMY    ? CARPAL TUNNEL RELEASE Right   ? CATARACT EXTRACTION Bilateral   ? HEMORROIDECTOMY    ? IVC Filter    ? TOTAL HIP ARTHROPLASTY Right   ? ULNAR NERVE TRANSPOSITION Right   ? ?Patient Active Problem List  ? Diagnosis Date Noted  ? Obesity (BMI 30-39.9) 01/28/2021  ? Acute CVA (cerebrovascular accident) ([place]) 01/25/2021  ? Acute kidney injury ([place]) 01/25/2021  ? CVA (cerebral vascular accident) ([place]) 01/06/2021  ? Essential hypertension 01/06/2021  ? Arthritis 01/06/2021  ? Pain due to onychomycosis of toenails of both feet 12/14/2018  ? Presbycusis of both ears 09/22/2015  ? Syncope 05/24/2013  ? ? ?ONSET DATE: 07/13/2021 (2nd CVA was 1010/22) ? ?REFERRING DIAG: I63.9 (ICD-10-CM) - CVA (cerebral vascular accident)  ? ?THERAPY DIAG:  ?Other abnormalities of gait and  mobility ? ?Muscle weakness (generalized) ? ?Hemiplegia and hemiparesis following cerebral infarction affecting left non-dominant side ([place]) ? ?Unsteadiness on feet ? ?SUBJECTIVE:  ?                                                                                                                                                                                           ? ?SUBJECTIVE STATEMENT: ?Pt known to this location from CVA 01/04/2021.  Readmitted on 01/25/2021 for worsening left-sided weakness upon awakening on 10/9. Eval by Dr. Leonie Man with MRI showing expansion of scattered right MCA infarcts. Pt went to SNF for 3.5 weeks and came home Nov 6th. Then had home health for 6-8 weeks. Pt lives with daughter. She was independent prior to CVA and walked without AD. Since recent stroke pt has stood with hemiwalker with therapy help for 3-4 minutes per daughter. She is using Harrel Lemon for all transfers. Daughter assists as primary caregiver. Pt has aide 3 hours during week and 2 hours on weekends. Pt does bed baths. Pt has history of vertigo for years and takes meclizine as needed. Pt spends most of the time in hospital bed. Is up in w/c a couple hours during day. ?Pt accompanied by: family member and daughter, Clinton Quant. ? ?PERTINENT HISTORY: 88 year old female with history of smoking, hypertension, cervical DDD, bilateral DVTs admitted on 01/05/2021 for bilateral hand weakness, numbness and the left leg weakness.  ? ?PAIN:  ?Are you having pain? No ?No pain currently but does have pain in knees at times that is chronic. Has history of injections in knees to help. ?PRECAUTIONS: Fall ? ?WEIGHT BEARING RESTRICTIONS No ? ?FALLS: Has patient fallen in last 6 months? Yes. Number of falls 2 falls after 1st stroke but none since the 2nd. ? ?LIVING ENVIRONMENT: ?Lives with: lives with their family and lives with their daughter ?Lives in: House/apartment ?Stairs: No has short ramp over the one step ?Has following equipment at home:  Single point cane, Quad cane small base, Walker - 2 wheeled, Environmental consultant - 4 wheeled, [employer] walker, Wheelchair (manual), Shower bench, bed side commode, Ramped entry, and hoyer lift ?Also has resting hand splint for left hand. ? ?PLOF: Independent and Independent with community mobility without device ? ?PATIENT GOALS Pt would like to be able to function like she was and take care of herself to get up out of bed. ? ?OBJECTIVE:  ? ?DIAGNOSTIC FINDINGS: 01/25/2021 ?IMPRESSION: ?Acute infarction affecting the deep and subcortical white matter of ?the right hemisphere, primarily in the frontal and parietal region ?with a punctate focus in the deep insular white matter. This ?pattern/distribution suggests watershed infarction, though there ?does appear to be flow within the right internal carotid artery. ?  ?Background pattern of chronic small vessel ischemic changes of the ?cerebral hemispheric white matter. Old small right parietal cortical ?infarction. ? CT no acute abnormality, CTA head and neck right M1 high-grade stenosis.  MRI showed right MCA territory scattered infarcts.  MRI C-spine showed C3/4 severe spinal stenosis and mild spinal cord compression.  ? ?COGNITION: ?Overall cognitive status: Impaired: pt doesn't feel that she has had any changes but daughter reports that she forgets things at times like remembering to eat, take meds ?  ?SENSATION: ?Light touch: WFL and intact to light touch ? ? ? ?EDEMA:  ?Noted some edema in LLE ? ? ?POSTURE: rounded shoulders, forward head, and pt sits leaning back in w/c. On edge of mat posterior and to the left ? ?LUE: elbow flexion 2-/5, elbow ext=2-/5, minimal finger flex/ext, lacking about 30 degrees elbow extension, trace shoulder flexion.  ?RUE: WFL strength ? ? ? ?MMT:   ? ?MMT Right ?08/04/2021 Left ?08/04/2021  ?Hip flexion 3+-5 2-/5  ?Hip extension    ?Hip abduction    ?Hip adduction    ?Hip internal rotation    ?Hip external rotation    ?Knee flexion 3/5 with pain 2-/5   ?Knee extension 3/5 with pain  2/5  ?Ankle dorsiflexion 4/5 2-/5  ?Ankle plantarflexion 3+/5 3/5  ?Ankle inversion    ?Ankle eversion    ?(Blank rows = not tested) ? ?BED MOBILITY:  ?Supine to sit Total A and 2 person assist to roll to right and then sit up edge of mat ?Rolling to Right Total A ?Rolling to Left Min A and Mod A. Pt reports she uses bed rail at home. ? ?TRANSFERS: ?Assistive device utilized:  [employer]   ? ?Chair to chair: Total A and with hoyer w/c to/from mat ? ? ?Sitting Balance: ? Able to sit edge of mat with +2 assist for safety. Assistance varied from max assist to [place]. Pt tends to lean posterior and to the left. Was able to briefly sit with hands in lap without support. ? ? ? ?TODAY'S TREATMENT:  ? ? ? ?PATIENT EDUCATION: ?Education details: PT poc. ?Person educated: Patient and Child(ren) ?Education method: Explanation ?Education comprehension: verbalized understanding ? ? ?HOME EXERCISE PROGRAM: ? ? ? ? ?GOALS: ?Goals reviewed with patient? Yes ? ?SHORT TERM GOALS: Target date: 09/01/2021 ? ?Pt will be able to perform initial HEP for ROM and strengthening with caregiver assist. ?Baseline: ?Goal status: INITIAL ? ?2.  Pt will be able to perform lateral transfer with slideboard max assist. ?Baseline: Hoyer lift ?Goal status: INITIAL ? ?3.  Pt will be able to maintain sitting balance edge of mat x 2 min supervision for improved sitting balance and ADLs. ?Baseline:  ?Goal status: INITIAL ? ?4.  Pt will be able to perform rolling to the left mod I and to the right mod assist for improved mobility and decrease caregiver burden. ?Baseline: min assist to left and total assist to right. ?Goal status: INITIAL ? ? ? ?LONG TERM GOALS: Target date:  10/04/21 ? ?Pt will be able to perform progressive HEP with caregiver assist for strengthening and ROM and balance. ?Baseline:  ?Goal status: INITIAL ? ?2.  Pt will be able to perform slideboard transfer after board placed mod assist for improved  mobility. ?Baseline:  ?Goal status: INITIAL ? ?3.  Pt will be able to sit edge of mat >5 min with hands in lap/performing reaching for improved balance and ADLs supervision. ?Baseline:  ?Goal status: INITIAL ? ?4.  Pt will be ab

## 2021-08-10 ENCOUNTER — Other Ambulatory Visit: Payer: Self-pay | Admitting: Internal Medicine

## 2021-08-11 LAB — URINE CULTURE
MICRO NUMBER:: 13309238
SPECIMEN QUALITY:: ADEQUATE

## 2021-08-11 LAB — CLIENT EDUCATION TRACKING

## 2021-08-18 ENCOUNTER — Encounter: Payer: Self-pay | Admitting: Physical Therapy

## 2021-08-18 ENCOUNTER — Ambulatory Visit: Payer: Medicare Other | Admitting: Occupational Therapy

## 2021-08-18 ENCOUNTER — Encounter: Payer: Self-pay | Admitting: Occupational Therapy

## 2021-08-18 ENCOUNTER — Ambulatory Visit: Payer: Medicare Other | Attending: Internal Medicine | Admitting: Physical Therapy

## 2021-08-18 DIAGNOSIS — M6281 Muscle weakness (generalized): Secondary | ICD-10-CM | POA: Insufficient documentation

## 2021-08-18 DIAGNOSIS — R278 Other lack of coordination: Secondary | ICD-10-CM | POA: Insufficient documentation

## 2021-08-18 DIAGNOSIS — R41842 Visuospatial deficit: Secondary | ICD-10-CM

## 2021-08-18 DIAGNOSIS — R208 Other disturbances of skin sensation: Secondary | ICD-10-CM | POA: Diagnosis present

## 2021-08-18 DIAGNOSIS — I69354 Hemiplegia and hemiparesis following cerebral infarction affecting left non-dominant side: Secondary | ICD-10-CM

## 2021-08-18 DIAGNOSIS — R41840 Attention and concentration deficit: Secondary | ICD-10-CM | POA: Diagnosis present

## 2021-08-18 DIAGNOSIS — R2681 Unsteadiness on feet: Secondary | ICD-10-CM | POA: Diagnosis present

## 2021-08-18 NOTE — Therapy (Signed)
?OUTPATIENT OCCUPATIONAL THERAPY TREATMENT NOTE ? ? ?Patient Name: [name] ?MRN: [id number] ?DOB:1928-10-03, 88 y.o., female ?Today's Date: 08/18/2021 ? ?PCP: Nolene Ebbs, MD  ?REFERRING PROVIDER: Nolene Ebbs, MD  ? ?END OF SESSION:  ? OT End of Session - 08/18/21 1322   ? ? Visit Number 2   ? Number of Visits 17   ? Date for OT Re-Evaluation 10/02/21   ? Authorization Type UHC Medicare and Medicaid,Covered 100% as long as she's enrolled with Medicaid  VL: Follows Medicare guidelines   ? Progress Note Due on Visit 10   ? OT Start Time 1215   ? OT Stop Time 1315   ? OT Time Calculation (min) 60 min   ? Equipment Utilized During [employer] lift, stedy   ? Behavior During Therapy [facility] for tasks assessed/performed   ? ?  ?  ? ?  ? ? ?Past Medical History:  ?Diagnosis Date  ? Allergic rhinitis   ? Arthritis   ? Carpal tunnel syndrome   ? Deep venous thrombosis ([place])   ? bilateral legs  ? Degenerative arthritis   ? right knee  ? Hypertension   ? Lumbar degenerative disc disease   ? Lump or mass in breast   ? Paresthesia   ? Syncope 05/24/2013  ? Vitamin D deficiency   ? ?Past Surgical History:  ?Procedure Laterality Date  ? ABDOMINAL HYSTERECTOMY    ? CARPAL TUNNEL RELEASE Right   ? CATARACT EXTRACTION Bilateral   ? HEMORROIDECTOMY    ? IVC Filter    ? TOTAL HIP ARTHROPLASTY Right   ? ULNAR NERVE TRANSPOSITION Right   ? ?Patient Active Problem List  ? Diagnosis Date Noted  ? Obesity (BMI 30-39.9) 01/28/2021  ? Acute CVA (cerebrovascular accident) ([place]) 01/25/2021  ? Acute kidney injury ([place]) 01/25/2021  ? CVA (cerebral vascular accident) ([place]) 01/06/2021  ? Essential hypertension 01/06/2021  ? Arthritis 01/06/2021  ? Pain due to onychomycosis of toenails of both feet 12/14/2018  ? Presbycusis of both ears 09/22/2015  ? Syncope 05/24/2013  ? ? ?ONSET DATE: 07/13/21 Referral date  ? ?REFERRING DIAG: Hemiplegia and hemiparesis following cerebral infarction affecting left non-dominant side ([place])  ? ?THERAPY  DIAG:  ?Hemiplegia and hemiparesis following cerebral infarction affecting left non-dominant side ([place]) ? ?Muscle weakness (generalized) ? ?Other lack of coordination ? ?Unsteadiness on feet ? ?Other disturbances of skin sensation ? ?Attention and concentration deficit ? ?Visuospatial deficit ? ? ?PERTINENT HISTORY: vertigo, smoking, hypertension, cervical DDD, bilateral DVTs  multiple strokes (09/22, 10/22) .  right MCA territory scattered infarcts.  MRI C-spine showed C3/4 severe spinal stenosis and mild spinal cord compression.  EF 60 to 65%  ? ?PRECAUTIONS: fall ? ?SUBJECTIVE: Don't give up! ? ?PAIN:  ?Are you having pain? No - Patient with history of R Knee pain ? ? ? ? ?OBJECTIVE:  ? ?TODAY'S TREATMENT: ? ?TODAY'S TREATMENT:  ?08/18/21:   ?Reviewed OT goals with patient and her daughter. Both in agreement.  Started with table slides to initiate self range of motion with LUE.  Encouraged forward flexion in wheelchair.  Daughter video'd table slides to share with patient's aides at home.   ?Used hoyer lift to transfer to seated edge of mat table.  Worked on weight shifting laterally to scoot hips forward for feet to floor.  Worked on forward flexion - reaching down toward feet as needed for LB bathing and dressing.  Patient initially hesitant, but willing and able to reach down to  contact sock.   ?Used Stedy lift to allow patient to lean forward, patient able to begin to load feet and unweight seat with max assist of two helpers (total assist)  ?Neuromuscular reeducation to allow patient to attend to left arm, and begin to actively participate in pre-reach patterns.  Shoulder flexion, elbow flexion, forearm pro/supination.  Gentle prolonged stretch to digits to increase passive flexion.   ? ? ?08/03/21:  Assessed unsupported sitting balance while PT was present to assist.  Patient able to maintain static position x 3-5 seconds before falling backward toward right. ?  ?  ?PATIENT EDUCATION: ?Education details:  Table slides ?Person educated: Patient and Child(ren) Daughter [name] ?Education method: Customer service manager, Video ?Education comprehension: verbalized understanding, needed cueing and facilitation ?  ?  ?HOME EXERCISE PROGRAM: ?08/18/21:  Table slides BUE  ?  ?  ?  ?GOALS: ?Goals reviewed with patient? Yes ?  ?SHORT TERM GOALS: Target date: 08/31/2021 ?  ?Patient and caregiver will complete an HEP designed to improve active range of motion in LUE ?Baseline: Stretching program ?Goal status: INITIAL ?  ?2.  Patient will demonstrate 30% composite flexion in digits ?Baseline: 20% then painful ?Goal status: INITIAL ?  ?3.  Patient will sit at edge of mat table with intermittent min assistance x 30 seconds in preparation for active participation in ADL ?Baseline: 3-5 sec  ?Goal status: INITIAL ?  ?4.  Patient will lean forward while seated at edge of mat as if reaching toward shoes/socks - reach past knees 2/3 down length of calf ?Baseline: just below knee ?Goal status: INITIAL ?  ?5.  Patient will wash her face, chest and left arm after set up assistance ?Baseline: Dependent ?Goal status: INITIAL ?  ?  ?LONG TERM GOALS: Target date: 09/28/2021 ?  ?Patient will complete home activity program designed to improve functional use of LUE in simple self care tasks ?Baseline: No HAP ?Goal status: INITIAL ?  ?2.  Patient will report pain no greater than 3/10 in LEFT shoulder with active reach ?Baseline: 6/10 ?Goal status: INITIAL ?  ?3.  Patient will complete an active level surface transfer with max assist ?Baseline: dependent- hoyer ?Goal status: INITIAL ?  ?4.  Patient will assist with pulling up and down lower body clothing to reduce caregiver burden for dressing ?Baseline: Dependent ?Goal status: INITIAL ?  ?5.  Patient will demonstrate at least 50% composite flexion in left hand to aide with grasp/release ?Baseline: 20% flex ?Goal status: INITIAL ?  ?  ?  ?ASSESSMENT: ?  ?CLINICAL IMPRESSION:  Patient verbalizes  desire for increased independence with ADL.  Patient spends much of her time at home in bed due to limited availability of CNA.  Daughter not able to move patient due to her own physical limitations.   ? ? ?PERFORMANCE DEFICITS in functional skills including ADLs, IADLs, coordination, dexterity, proprioception, sensation, edema, tone, ROM, strength, pain, fascial restrictions, muscle spasms, flexibility, FMC, GMC, mobility, balance, body mechanics, endurance, cardiopulmonary status limiting function, decreased knowledge of use of DME, UE functional use, and vestibular, cognitive skills including attention, energy/drive, memory, orientation, perception, problem solving, safety awareness, and sequencing, and psychosocial skills including environmental adaptation and habits.  ?  ?IMPAIRMENTS are limiting patient from ADLs, IADLs, rest and sleep, leisure, and social participation.  ?  ?COMORBIDITIES may have co-morbidities  that affects occupational performance. Patient will benefit from skilled OT to address above impairments and improve overall function. ?  ?MODIFICATION OR ASSISTANCE TO COMPLETE EVALUATION: Min-Moderate  modification of tasks or assist with assess necessary to complete an evaluation. ?  ?OT OCCUPATIONAL PROFILE AND HISTORY: Detailed assessment: Review of records and additional review of physical, cognitive, psychosocial history related to current functional performance. ?  ?CLINICAL DECISION MAKING: Moderate - several treatment options, min-mod task modification necessary ?  ?REHAB POTENTIAL: Good ?  ?EVALUATION COMPLEXITY: Moderate ?  ?  ?PLAN: ?OT FREQUENCY: 2x/week ?  ?OT DURATION: 8 weeks ?  ?PLANNED INTERVENTIONS: self care/ADL training, therapeutic exercise, therapeutic activity, neuromuscular re-education, manual therapy, passive range of motion, balance training, functional mobility training, splinting, electrical stimulation, ultrasound, paraffin, fluidotherapy, moist heat, cryotherapy,  patient/family education, cognitive remediation/compensation, visual/perceptual remediation/compensation, energy conservation, coping strategies training, and DME and/or AE instructions ?  ?RECOMMENDED OTHER SER

## 2021-08-18 NOTE — Therapy (Signed)
?OUTPATIENT PHYSICAL THERAPY TREATMENT NOTE ? ? ?Patient Name: [name] ?MRN: [id number] ?DOB:01-Jun-1928, 88 y.o., female ?Today's Date: 08/18/2021 ? ?PCP: Nolene Ebbs, MD  ?REFERRING PROVIDER: Nolene Ebbs, MD ? ?END OF SESSION:  ? PT End of Session - 08/18/21 1319   ? ? Visit Number 2   ? Number of Visits 17   ? Date for PT Re-Evaluation 10/01/21   ? Authorization Type UHC medicare so 10th visit progress note   ? PT Start Time 3329   ? PT Stop Time 1400   ? PT Time Calculation (min) 43 min   ? Equipment Utilized During Treatment Other (comment)   Hoyer  ? Activity Tolerance Patient tolerated treatment well   ? Behavior During Therapy [facility] for tasks assessed/performed   ? ?  ?  ? ?  ? ? ?Past Medical History:  ?Diagnosis Date  ? Allergic rhinitis   ? Arthritis   ? Carpal tunnel syndrome   ? Deep venous thrombosis ([place])   ? bilateral legs  ? Degenerative arthritis   ? right knee  ? Hypertension   ? Lumbar degenerative disc disease   ? Lump or mass in breast   ? Paresthesia   ? Syncope 05/24/2013  ? Vitamin D deficiency   ? ?Past Surgical History:  ?Procedure Laterality Date  ? ABDOMINAL HYSTERECTOMY    ? CARPAL TUNNEL RELEASE Right   ? CATARACT EXTRACTION Bilateral   ? HEMORROIDECTOMY    ? IVC Filter    ? TOTAL HIP ARTHROPLASTY Right   ? ULNAR NERVE TRANSPOSITION Right   ? ?Patient Active Problem List  ? Diagnosis Date Noted  ? Obesity (BMI 30-39.9) 01/28/2021  ? Acute CVA (cerebrovascular accident) ([place]) 01/25/2021  ? Acute kidney injury ([place]) 01/25/2021  ? CVA (cerebral vascular accident) ([place]) 01/06/2021  ? Essential hypertension 01/06/2021  ? Arthritis 01/06/2021  ? Pain due to onychomycosis of toenails of both feet 12/14/2018  ? Presbycusis of both ears 09/22/2015  ? Syncope 05/24/2013  ? ? ?REFERRING DIAG: I63.9 (ICD-10-CM) - CVA (cerebral vascular accident)   ? ?THERAPY DIAG:  ?Hemiplegia and hemiparesis following cerebral infarction affecting left non-dominant side ([place]) ? ?Muscle weakness  (generalized) ? ?Unsteadiness on feet ? ?PERTINENT HISTORY: 88 year old female with history of smoking, hypertension, cervical DDD, bilateral DVTs admitted on 01/05/2021 for bilateral hand weakness, numbness and the left leg weakness.   ? ?PRECAUTIONS: Fall  ? ?SUBJECTIVE: No new complaints. No falls or pain to report at this time.  ? ?PAIN:  ?Are you having pain? No ? ? ?TODAY'S TREATMENT: ?08/18/2021 ?BED MOBILITY ? Max/total assist of 2 for sitting edge of mat to supine on mat table. Max assist with cues/facilitation to roll left and right x 2 each. ? ?STRENGTHENING  ?Issued HEP for stretching and strengthening. Refer to [employer] for full details. Daughter present and educated on how to assist pt with written instructions provided. No issues noted or reported with performance in session.  ? ?TRANSFERS: ?Via hoyer lift from mat table to manual wheelchair.  ? ?PATIENT EDUCATION: ?Education details: initial HEP ?Person educated: Patient and Child(ren) ?Education method: Explanation, Demonstration, Tactile cues, Verbal cues, and Handouts ?Education comprehension: verbalized understanding, returned demonstration, verbal cues required, tactile cues required, and needs further education  ?  ?  ?HOME EXERCISE PROGRAM: ? Access Code: [email or web address] ?URL: [email or web address] ?Date: 08/18/2021 ?Prepared by: Willow Ora ? ?Exercises ?- Seated Heel Raise  - 1 x daily - 5 x weekly - 1 sets - 10  reps ?- seated hamstring stretch  - 1 x daily - 5 x weekly - 1 sets - 3 reps - 30 seconds hold ?- Supine Transversus Abdominis Bracing - Hands on Ground  - 1 x daily - 5 x weekly - 1 sets - 10 reps ?- Hip and Knee Extension and Flexion Caregiver PROM  - 1 x daily - 5 x weekly - 1 sets - 10 reps ?- Supine Knee Extension Strengthening  - 1 x daily - 5 x weekly - 1 sets - 10 reps ?- Supine Lower Trunk Rotation  - 1 x daily - 5 x weekly - 1 sets - 5-6 reps ?- Supine Hip Adduction Isometric with Ball  - 1 x daily - 5 x weekly  - 1 sets - 10 reps - 3 seconds hold ?  ?  ?  ?GOALS: ?Goals reviewed with patient? Yes ?  ?SHORT TERM GOALS: Target date: 09/01/2021 ?  ?Pt will be able to perform initial HEP for ROM and strengthening with caregiver assist. ?Baseline: ?Goal status: INITIAL ?  ?2.  Pt will be able to perform lateral transfer with slideboard max assist. ?Baseline: Hoyer lift ?Goal status: INITIAL ?  ?3.  Pt will be able to maintain sitting balance edge of mat x 2 min supervision for improved sitting balance and ADLs. ?Baseline:  ?Goal status: INITIAL ?  ?4.  Pt will be able to perform rolling to the left mod I and to the right mod assist for improved mobility and decrease caregiver burden. ?Baseline: min assist to left and total assist to right. ?Goal status: INITIAL ?  ?  ?  ?LONG TERM GOALS: Target date:  10/04/21 ?  ?Pt will be able to perform progressive HEP with caregiver assist for strengthening and ROM and balance. ?Baseline:  ?Goal status: INITIAL ?  ?2.  Pt will be able to perform slideboard transfer after board placed mod assist for improved mobility. ?Baseline:  ?Goal status: INITIAL ?  ?3.  Pt will be able to sit edge of mat >5 min with hands in lap/performing reaching for improved balance and ADLs supervision. ?Baseline:  ?Goal status: INITIAL ?  ?4.  Pt will be able to stand at sink x 2 min min assist for improved standing ability. ?Baseline:  ?Goal status: INITIAL ?  ?5.  Pt will be able to perform sit to/from supine max assist for improved mobility. ?Baseline:  ?Goal status: INITIAL ?  ?  ?  ?ASSESSMENT: ?  ?CLINICAL IMPRESSION: ?Skilled session focused on establishment of an HEP to address stretching and strengthening. No issues noted or reported in session. The pt should benefit from continued PT to progress toward unmet goals.  ?  ?  ?OBJECTIVE IMPAIRMENTS Abnormal gait, decreased balance, decreased knowledge of use of DME, decreased mobility, decreased ROM, decreased strength, dizziness, increased edema, impaired UE  functional use, and pain.  ?  ?ACTIVITY LIMITATIONS cleaning, community activity, driving, meal prep, laundry, medication management, yard work, and shopping.  ?  ?PERSONAL FACTORS Age, Time since onset of injury/illness/exacerbation, and 3+ comorbidities: hypertension, cervical DDD, bilateral DVTs admitted on 01/05/2021 for bilateral hand weakness, numbness and the left leg weakness  are also affecting patient's functional outcome.  ?  ?  ?REHAB POTENTIAL: Fair   ?  ?CLINICAL DECISION MAKING: Evolving/moderate complexity ?  ?EVALUATION COMPLEXITY: Moderate ?  ?PLAN: ?PT FREQUENCY: 2x/week ?  ?PT DURATION: 8 weeks ?  ?PLANNED INTERVENTIONS: Therapeutic exercises, Therapeutic activity, Neuromuscular re-education, Balance training, Gait training, Patient/Family education, Joint  mobilization, Vestibular training, Canalith repositioning, Orthotic/Fit training, DME instructions, Wheelchair mobility training, Cryotherapy, Moist heat, and Manual therapy ?  ?PLAN FOR NEXT SESSION:  Bed mobility. Is Hoyer lift transfer at this time. If have 2nd person to help work on sitting edge of mat. Continue to work on LE/core strengthening ? ?Willow Ora, PTA, CLT ?[place] ?[address], [address] ?[place], [place] [address] ?[phone] ?08/18/21, 9:06 PM  ? ? ?

## 2021-08-25 ENCOUNTER — Encounter: Payer: Self-pay | Admitting: Occupational Therapy

## 2021-08-25 ENCOUNTER — Ambulatory Visit: Payer: Medicare Other | Admitting: Occupational Therapy

## 2021-08-25 ENCOUNTER — Ambulatory Visit: Payer: Medicare Other | Admitting: Physical Therapy

## 2021-08-25 DIAGNOSIS — M6281 Muscle weakness (generalized): Secondary | ICD-10-CM

## 2021-08-25 DIAGNOSIS — R278 Other lack of coordination: Secondary | ICD-10-CM

## 2021-08-25 DIAGNOSIS — I69354 Hemiplegia and hemiparesis following cerebral infarction affecting left non-dominant side: Secondary | ICD-10-CM

## 2021-08-25 DIAGNOSIS — R2681 Unsteadiness on feet: Secondary | ICD-10-CM

## 2021-08-25 DIAGNOSIS — R208 Other disturbances of skin sensation: Secondary | ICD-10-CM

## 2021-08-25 DIAGNOSIS — R41840 Attention and concentration deficit: Secondary | ICD-10-CM

## 2021-08-25 DIAGNOSIS — R41842 Visuospatial deficit: Secondary | ICD-10-CM

## 2021-08-25 NOTE — Therapy (Signed)
?OUTPATIENT OCCUPATIONAL THERAPY TREATMENT NOTE ? ? ?Patient Name: [name] ?MRN: [id number] ?DOB:23-Jun-1928, 88 y.o., female ?Today's Date: 08/25/2021 ? ?PCP: Nolene Ebbs, MD  ?REFERRING PROVIDER: Nolene Ebbs, MD  ? ?END OF SESSION:  ? OT End of Session - 08/25/21 1238   ? ? Visit Number 3   ? Number of Visits 17   ? Date for OT Re-Evaluation 10/02/21   ? Authorization Type UHC Medicare and Medicaid,Covered 100% as long as she's enrolled with Medicaid  VL: Follows Medicare guidelines   ? Progress Note Due on Visit 10   ? OT Start Time 1145   ? OT Stop Time 1230   ? OT Time Calculation (min) 45 min   ? Equipment Utilized During [employer] lift, stedy   ? Activity Tolerance Patient tolerated treatment well   ? Behavior During Therapy [facility] for tasks assessed/performed   ? ?  ?  ? ?  ? ? ?Past Medical History:  ?Diagnosis Date  ? Allergic rhinitis   ? Arthritis   ? Carpal tunnel syndrome   ? Deep venous thrombosis ([place] [place])   ? bilateral legs  ? Degenerative arthritis   ? right knee  ? Hypertension   ? Lumbar degenerative disc disease   ? Lump or mass in breast   ? Paresthesia   ? Syncope 05/24/2013  ? Vitamin D deficiency   ? ?Past Surgical History:  ?Procedure Laterality Date  ? ABDOMINAL HYSTERECTOMY    ? CARPAL TUNNEL RELEASE Right   ? CATARACT EXTRACTION Bilateral   ? HEMORROIDECTOMY    ? IVC Filter    ? TOTAL HIP ARTHROPLASTY Right   ? ULNAR NERVE TRANSPOSITION Right   ? ?Patient Active Problem List  ? Diagnosis Date Noted  ? Obesity (BMI 30-39.9) 01/28/2021  ? Acute CVA (cerebrovascular accident) ([place]) 01/25/2021  ? Acute kidney injury ([place]) 01/25/2021  ? CVA (cerebral vascular accident) ([place]) 01/06/2021  ? Essential hypertension 01/06/2021  ? Arthritis 01/06/2021  ? Pain due to onychomycosis of toenails of both feet 12/14/2018  ? Presbycusis of both ears 09/22/2015  ? Syncope 05/24/2013  ? ? ?ONSET DATE: 07/13/21 Referral date  ? ?REFERRING DIAG: Hemiplegia and hemiparesis following cerebral  infarction affecting left non-dominant side ([place])  ? ?THERAPY DIAG:  ?Hemiplegia and hemiparesis following cerebral infarction affecting left non-dominant side ([place]) ? ?Muscle weakness (generalized) ? ?Other lack of coordination ? ?Unsteadiness on feet ? ?Other disturbances of skin sensation ? ?Attention and concentration deficit ? ?Visuospatial deficit ? ? ?PERTINENT HISTORY: vertigo, smoking, hypertension, cervical DDD, bilateral DVTs  multiple strokes (09/22, 10/22) .  right MCA territory scattered infarcts.  MRI C-spine showed C3/4 severe spinal stenosis and mild spinal cord compression.  EF 60 to 65%  ? ?PRECAUTIONS: fall ? ?SUBJECTIVE: Patient's daughter indicated the aide has been doing range of motion every day, and they have started sitting patient on edge of bed.   ?PAIN:  ?Are you having pain? No - Patient with history of R Knee pain ? ? ? ? ?OBJECTIVE:  ? ?TODAY'S TREATMENT: ? ?TODAY'S TREATMENT:  ?08/25/21: ?Patient arrived with daughter well before scheduled visit. Daughter indicated that transportation brings them too early.  Used hoyer lift to transfer to seated edge of mat table.  Worked on weight shifting laterally to scoot hips forward for feet to floor.  Worked on forward flexion at hips with thoracic extension.  Worked on weight shifting forward sufficiently to weight feet.  Worked on pivoting on feet to lateral  weight shift L/R on mat table.  With Stedy lift, elevated surface, and cueing facilitation for forward weight shift, patient able to transition to partial stand with 2 helpers.   ?Transferred back to wheelchair from [place] with hoyer pad in place. ? ?08/18/21:   ?Reviewed OT goals with patient and her daughter. Both in agreement.  Started with table slides to initiate self range of motion with LUE.  Encouraged forward flexion in wheelchair.  Daughter video'd table slides to share with patient's aides at home.   ?Used hoyer lift to transfer to seated edge of mat table.  Worked on weight  shifting laterally to scoot hips forward for feet to floor.  Worked on forward flexion - reaching down toward feet as needed for LB bathing and dressing.  Patient initially hesitant, but willing and able to reach down to contact sock.   ?Used Stedy lift to allow patient to lean forward, patient able to begin to load feet and unweight seat with max assist of two helpers (total assist)  ?Neuromuscular reeducation to allow patient to attend to left arm, and begin to actively participate in pre-reach patterns.  Shoulder flexion, elbow flexion, forearm pro/supination.  Gentle prolonged stretch to digits to increase passive flexion.   ? ? ? ?  ?  ?PATIENT EDUCATION: ?Education details: Table slides ?Person educated: Patient and Child(ren) Daughter [name] ?Education method: Customer service manager, Video ?Education comprehension: verbalized understanding, needed cueing and facilitation ?  ?  ?HOME EXERCISE PROGRAM: ?08/18/21:  Table slides BUE  ?  ?  ?  ?GOALS: ?Goals reviewed with patient? Yes ?  ?SHORT TERM GOALS: Target date: 08/31/2021 ?  ?Patient and caregiver will complete an HEP designed to improve active range of motion in LUE ?Baseline: Stretching program ?Goal status: Ongoing ?  ?2.  Patient will demonstrate 30% composite flexion in digits ?Baseline: 20% then painful ?Goal status: Ongoing ?  ?3.  Patient will sit at edge of mat table with intermittent min assistance x 30 seconds in preparation for active participation in ADL ?Baseline: 3-5 sec  ?Goal status: Achieved ?  ?4.  Patient will lean forward while seated at edge of mat as if reaching toward shoes/socks - reach past knees 2/3 down length of calf ?Baseline: just below knee ?Goal status: Achieved ?  ?5.  Patient will wash her face, chest and left arm after set up assistance ?Baseline: Dependent ?Goal status: Ongoing ?  ?  ?LONG TERM GOALS: Target date: 09/28/2021 ?  ?Patient will complete home activity program designed to improve functional use of LUE  in simple self care tasks ?Baseline: No HAP ?Goal status: Ongoing ?  ?2.  Patient will report pain no greater than 3/10 in LEFT shoulder with active reach ?Baseline: 6/10 ?Goal status: Ongoing ?  ?3.  Patient will complete an active level surface transfer with max assist ?Baseline: dependent- hoyer ?Goal status: Ongoing ?  ?4.  Patient will assist with pulling up and down lower body clothing to reduce caregiver burden for dressing ?Baseline: Dependent ?Goal status: Ongoing ?  ?5.  Patient will demonstrate at least 50% composite flexion in left hand to aide with grasp/release ?Baseline: 20% flex ?Goal status: Ongoing ?  ?  ?  ?ASSESSMENT: ?  ?CLINICAL IMPRESSION:  Patient's daughter reports slight increase in activity level since last session, now sitting on edge of bed for brief periods, and working on table slides.  Patient with improving physical performance as her fear of movement subsides.    ? ? ?  PERFORMANCE DEFICITS in functional skills including ADLs, IADLs, coordination, dexterity, proprioception, sensation, edema, tone, ROM, strength, pain, fascial restrictions, muscle spasms, flexibility, FMC, GMC, mobility, balance, body mechanics, endurance, cardiopulmonary status limiting function, decreased knowledge of use of DME, UE functional use, and vestibular, cognitive skills including attention, energy/drive, memory, orientation, perception, problem solving, safety awareness, and sequencing, and psychosocial skills including environmental adaptation and habits.  ?  ?IMPAIRMENTS are limiting patient from ADLs, IADLs, rest and sleep, leisure, and social participation.  ?  ?COMORBIDITIES may have co-morbidities  that affects occupational performance. Patient will benefit from skilled OT to address above impairments and improve overall function. ?  ?MODIFICATION OR ASSISTANCE TO COMPLETE EVALUATION: Min-Moderate modification of tasks or assist with assess necessary to complete an evaluation. ?  ?OT OCCUPATIONAL  PROFILE AND HISTORY: Detailed assessment: Review of records and additional review of physical, cognitive, psychosocial history related to current functional performance. ?  ?CLINICAL DECISION MAKING: Moderate - sev

## 2021-08-27 ENCOUNTER — Encounter: Payer: Self-pay | Admitting: Physical Therapy

## 2021-08-27 NOTE — Therapy (Signed)
?OUTPATIENT PHYSICAL THERAPY TREATMENT NOTE ? ? ?Patient Name: [name] ?MRN: [id number] ?DOB:Mar 02, 1929, 88 y.o., female ?Today's Date: 08/27/2021 ? ?PCP: Nolene Ebbs, MD  ?REFERRING PROVIDER: Nolene Ebbs, MD ? ?END OF SESSION:  ? 08/25/21 1317  ?PT Visits / Re-Eval  ?Visit Number 3  ?Number of Visits 17  ?Date for PT Re-Evaluation 10/01/21  ?Authorization  ?Authorization Type [employer] medicare so 10th visit progress note  ?PT Time Calculation  ?PT Start Time 1316  ?PT Stop Time 3716  ?PT Time Calculation (min) 48 min  ?PT - End of Session  ?Equipment Utilized During Treatment Other (comment) ?Harrel Lemon)  ?Activity Tolerance Patient tolerated treatment well  ?Behavior During Therapy [facility] for tasks assessed/performed  ? ? ? ?Past Medical History:  ?Diagnosis Date  ? Allergic rhinitis   ? Arthritis   ? Carpal tunnel syndrome   ? Deep venous thrombosis ([place])   ? bilateral legs  ? Degenerative arthritis   ? right knee  ? Hypertension   ? Lumbar degenerative disc disease   ? Lump or mass in breast   ? Paresthesia   ? Syncope 05/24/2013  ? Vitamin D deficiency   ? ?Past Surgical History:  ?Procedure Laterality Date  ? ABDOMINAL HYSTERECTOMY    ? CARPAL TUNNEL RELEASE Right   ? CATARACT EXTRACTION Bilateral   ? HEMORROIDECTOMY    ? IVC Filter    ? TOTAL HIP ARTHROPLASTY Right   ? ULNAR NERVE TRANSPOSITION Right   ? ?Patient Active Problem List  ? Diagnosis Date Noted  ? Obesity (BMI 30-39.9) 01/28/2021  ? Acute CVA (cerebrovascular accident) ([place]) 01/25/2021  ? Acute kidney injury ([place]) 01/25/2021  ? CVA (cerebral vascular accident) ([place]) 01/06/2021  ? Essential hypertension 01/06/2021  ? Arthritis 01/06/2021  ? Pain due to onychomycosis of toenails of both feet 12/14/2018  ? Presbycusis of both ears 09/22/2015  ? Syncope 05/24/2013  ? ? ?REFERRING DIAG: I63.9 (ICD-10-CM) - CVA (cerebral vascular accident)   ? ?THERAPY DIAG:  ?Hemiplegia and hemiparesis following cerebral infarction affecting left non-dominant side  ([place]) ? ?Muscle weakness (generalized) ? ?Other lack of coordination ? ?Unsteadiness on feet ? ?PERTINENT HISTORY: 88 year old female with history of smoking, hypertension, cervical DDD, bilateral DVTs admitted on 01/05/2021 for bilateral hand weakness, numbness and the left leg weakness.   ? ?PRECAUTIONS: Fall  ? ?SUBJECTIVE: No new complaints. No falls or pain to report at this time.  ? ?PAIN:  ?Are you having pain? No ? ? ?TODAY'S TREATMENT: ? ?BED MOBILITY ?Pt transferred from manual w/c to supine on mat table using hoyer and +2 assistance from daughter. ?Pt cued for functional reach across midline with therapist providing max assist for initial placement of RLE into hooklying with cues to engage hip extension to push and roll.  PT using sling to boost pt into side-lying.  Cues to use shoulder and neck rotation to initiate supine.  MaxA for LLE hooklying w/ use of RUE to protect LUE during roll.  Increased initiation for roll w/ inc reps.  Completed x3 each side.  MaxA. ? ?STRENGTHENING  ?PT places bolster under bil knees, SAQ performed w/ tapping at quad insertion to engage LLE to mid-range 4x4 reps each LE w/ RLE having better ROM. ?Attempted heel slides w/ inc AAROM (maxA) provided on LLE.  RLE with improved ROM w/ inc reps. ?Ankle pumps 2x5 with pt demonstrating dec engagement w/ inc reps, could feel trace activation of DF throughout. ?In supine attempted glut sets progressed to bridges  x12 w/ pt unable to clear hips from mat, but demonstrating good activation w/ hold x3 sec ? ?At end of session pt returned to upright sitting in w/c +2 via hoyer lift. ? ?PATIENT EDUCATION: ?Education details: Continue HEP. ?Person educated: Patient and Child(ren) ?Education method: Explanation, Demonstration, Tactile cues, Verbal cues, and Handouts ?Education comprehension: verbalized understanding, returned demonstration, verbal cues required, tactile cues required, and needs further education  ?  ?  ?HOME EXERCISE  PROGRAM: ? Access Code: [email or web address] ?URL: [email or web address] ?Date: 08/18/2021 ?Prepared by: Willow Ora ? ?Exercises ?- Seated Heel Raise  - 1 x daily - 5 x weekly - 1 sets - 10 reps ?- seated hamstring stretch  - 1 x daily - 5 x weekly - 1 sets - 3 reps - 30 seconds hold ?- Supine Transversus Abdominis Bracing - Hands on Ground  - 1 x daily - 5 x weekly - 1 sets - 10 reps ?- Hip and Knee Extension and Flexion Caregiver PROM  - 1 x daily - 5 x weekly - 1 sets - 10 reps ?- Supine Knee Extension Strengthening  - 1 x daily - 5 x weekly - 1 sets - 10 reps ?- Supine Lower Trunk Rotation  - 1 x daily - 5 x weekly - 1 sets - 5-6 reps ?- Supine Hip Adduction Isometric with Ball  - 1 x daily - 5 x weekly - 1 sets - 10 reps - 3 seconds hold ?  ?  ?  ?GOALS: ?Goals reviewed with patient? Yes ?  ?SHORT TERM GOALS: Target date: 09/01/2021 ?  ?Pt will be able to perform initial HEP for ROM and strengthening with caregiver assist. ?Baseline: ?Goal status: INITIAL ?  ?2.  Pt will be able to perform lateral transfer with slideboard max assist. ?Baseline: Hoyer lift ?Goal status: INITIAL ?  ?3.  Pt will be able to maintain sitting balance edge of mat x 2 min supervision for improved sitting balance and ADLs. ?Baseline:  ?Goal status: INITIAL ?  ?4.  Pt will be able to perform rolling to the left mod I and to the right mod assist for improved mobility and decrease caregiver burden. ?Baseline: min assist to left and total assist to right. ?Goal status: INITIAL ?  ?  ?  ?LONG TERM GOALS: Target date:  10/04/21 ?  ?Pt will be able to perform progressive HEP with caregiver assist for strengthening and ROM and balance. ?Baseline:  ?Goal status: INITIAL ?  ?2.  Pt will be able to perform slideboard transfer after board placed mod assist for improved mobility. ?Baseline:  ?Goal status: INITIAL ?  ?3.  Pt will be able to sit edge of mat >5 min with hands in lap/performing reaching for improved balance and ADLs  supervision. ?Baseline:  ?Goal status: INITIAL ?  ?4.  Pt will be able to stand at sink x 2 min min assist for improved standing ability. ?Baseline:  ?Goal status: INITIAL ?  ?5.  Pt will be able to perform sit to/from supine max assist for improved mobility. ?Baseline:  ?Goal status: INITIAL ?  ?  ?  ?ASSESSMENT: ?  ?CLINICAL IMPRESSION: ?Focus of skilled session on pt initiation of large muscle groups w/ massed task practice used to encourage maximum pt engagement prior to PT rolling pt into side-lying.  Pt demonstrates good muscle engagement with repetition and max encouragement.  She requires simplified instruction verbalized loudly due to hearing deficits.  Will continue per POC. ?  ?  ?OBJECTIVE IMPAIRMENTS  Abnormal gait, decreased balance, decreased knowledge of use of DME, decreased mobility, decreased ROM, decreased strength, dizziness, increased edema, impaired UE functional use, and pain.  ?  ?ACTIVITY LIMITATIONS cleaning, community activity, driving, meal prep, laundry, medication management, yard work, and shopping.  ?  ?PERSONAL FACTORS Age, Time since onset of injury/illness/exacerbation, and 3+ comorbidities: hypertension, cervical DDD, bilateral DVTs admitted on 01/05/2021 for bilateral hand weakness, numbness and the left leg weakness  are also affecting patient's functional outcome.  ?  ?  ?REHAB POTENTIAL: Fair   ?  ?CLINICAL DECISION MAKING: Evolving/moderate complexity ?  ?EVALUATION COMPLEXITY: Moderate ?  ?PLAN: ?PT FREQUENCY: 2x/week ?  ?PT DURATION: 8 weeks ?  ?PLANNED INTERVENTIONS: Therapeutic exercises, Therapeutic activity, Neuromuscular re-education, Balance training, Gait training, Patient/Family education, Joint mobilization, Vestibular training, Canalith repositioning, Orthotic/Fit training, DME instructions, Wheelchair mobility training, Cryotherapy, Moist heat, and Manual therapy ?  ?PLAN FOR NEXT SESSION:  Bed mobility. Is Hoyer lift transfer at this time. If have 2nd person to  help work on sitting edge of mat. Continue to work on Marketing executive.  Address IR/ER of hip in supine, further stretching-especially Left ankle, glut/quad sets, bridging, heel slides-use tapping at tendon insertion, trunk rotati

## 2021-08-30 ENCOUNTER — Ambulatory Visit: Payer: Medicare Other | Admitting: Occupational Therapy

## 2021-08-30 ENCOUNTER — Encounter: Payer: Self-pay | Admitting: Occupational Therapy

## 2021-08-30 ENCOUNTER — Ambulatory Visit: Payer: Medicare Other | Admitting: Physical Therapy

## 2021-08-30 ENCOUNTER — Encounter: Payer: Self-pay | Admitting: Physical Therapy

## 2021-08-30 DIAGNOSIS — R2681 Unsteadiness on feet: Secondary | ICD-10-CM

## 2021-08-30 DIAGNOSIS — R41840 Attention and concentration deficit: Secondary | ICD-10-CM

## 2021-08-30 DIAGNOSIS — I69354 Hemiplegia and hemiparesis following cerebral infarction affecting left non-dominant side: Secondary | ICD-10-CM

## 2021-08-30 DIAGNOSIS — R278 Other lack of coordination: Secondary | ICD-10-CM

## 2021-08-30 DIAGNOSIS — R41842 Visuospatial deficit: Secondary | ICD-10-CM

## 2021-08-30 DIAGNOSIS — M6281 Muscle weakness (generalized): Secondary | ICD-10-CM

## 2021-08-30 DIAGNOSIS — R208 Other disturbances of skin sensation: Secondary | ICD-10-CM

## 2021-08-30 NOTE — Therapy (Signed)
?OUTPATIENT PHYSICAL THERAPY TREATMENT NOTE ? ? ?Patient Name: [name] ?MRN: [id number] ?DOB:1928/10/26, 88 y.o., female ?Today's Date: 08/30/2021 ? ?PCP: Nolene Ebbs, MD  ?REFERRING PROVIDER: Nolene Ebbs, MD ? ?END OF SESSION:  ? 08/30/21 1533  ?PT Visits / Re-Eval  ?Visit Number 4  ?Number of Visits 17  ?Date for PT Re-Evaluation 10/01/21  ?Authorization  ?Authorization Type [employer] medicare so 10th visit progress note  ?PT Time Calculation  ?PT Start Time 6629  ?PT Stop Time 4765  ?PT Time Calculation (min) 40 min  ?PT - End of Session  ?Equipment Utilized During Treatment Other (comment) ?Harrel Lemon)  ?Activity Tolerance Patient tolerated treatment well  ?Behavior During Therapy [facility] for tasks assessed/performed  ? ? PT End of Session - 08/30/21 1533   ? ? Visit Number 4   ? Number of Visits 17   ? Date for PT Re-Evaluation 10/01/21   ? Authorization Type [employer] medicare so 10th visit progress note   ? PT Start Time 4650   ? PT Stop Time 3546   ? PT Time Calculation (min) 40 min   ? Equipment Utilized During Treatment Other (comment)   Hoyer  ? Activity Tolerance Patient tolerated treatment well   ? Behavior During Therapy [facility] for tasks assessed/performed   ? ?  ?  ? ?  ? ? ? ?Past Medical History:  ?Diagnosis Date  ? Allergic rhinitis   ? Arthritis   ? Carpal tunnel syndrome   ? Deep venous thrombosis ([place])   ? bilateral legs  ? Degenerative arthritis   ? right knee  ? Hypertension   ? Lumbar degenerative disc disease   ? Lump or mass in breast   ? Paresthesia   ? Syncope 05/24/2013  ? Vitamin D deficiency   ? ?Past Surgical History:  ?Procedure Laterality Date  ? ABDOMINAL HYSTERECTOMY    ? CARPAL TUNNEL RELEASE Right   ? CATARACT EXTRACTION Bilateral   ? HEMORROIDECTOMY    ? IVC Filter    ? TOTAL HIP ARTHROPLASTY Right   ? ULNAR NERVE TRANSPOSITION Right   ? ?Patient Active Problem List  ? Diagnosis Date Noted  ? Obesity (BMI 30-39.9) 01/28/2021  ? Acute CVA (cerebrovascular accident) ([place]) 01/25/2021  ? Acute  kidney injury ([place]) 01/25/2021  ? CVA (cerebral vascular accident) ([place]) 01/06/2021  ? Essential hypertension 01/06/2021  ? Arthritis 01/06/2021  ? Pain due to onychomycosis of toenails of both feet 12/14/2018  ? Presbycusis of both ears 09/22/2015  ? Syncope 05/24/2013  ? ? ?REFERRING DIAG: I63.9 (ICD-10-CM) - CVA (cerebral vascular accident)   ? ?THERAPY DIAG:  ?Hemiplegia and hemiparesis following cerebral infarction affecting left non-dominant side ([place]) ? ?Muscle weakness (generalized) ? ?Other lack of coordination ? ?Unsteadiness on feet ? ?PERTINENT HISTORY: 88 year old female with history of smoking, hypertension, cervical DDD, bilateral DVTs admitted on 01/05/2021 for bilateral hand weakness, numbness and the left leg weakness.   ? ?PRECAUTIONS: Fall  ? ?SUBJECTIVE: No new complaints. No falls or pain to report at this time.  ? ?PAIN:  ?Are you having pain? Yes: NPRS scale: 7-8/10 ?Pain location: RLE ?Pain description: soreness ?Aggravating factors: movement ?Relieving factors: Tylenol ? ? ?TODAY'S TREATMENT: ?Received from OT in short-sitting EOM.  Tech seated behind pt w/ blue ball.  PT maintaining upright independently initially. ? ?Pt performs forward reach in midline progressed to cuing to reach left with minimal correction.  Provided mirror feedback w/ use of visual cue for midline with improvement.  Pt  able to maintain midline with visual feedback and intermittently leans posterolaterally to the right with min cuing for correction.  Tech provides rest breaks throughout by supporting pt's trunk posteriorly with ball. ?Tech instructed to increase the distance between pt and ball, pt instructed to controlled lean posteriorly to touch ball progressed to increased core engagement to return to upright.  Cues for use of shoulder shrug and head momentum to better engage to task. ?Pt maintains upright progressed to multi-directional cone taps to and across midline further progressed to reaching high and low  outside seated BOS w/ CGA-minA to recover x2 ?Seated bean bag toss maintaining upright, requires 1 supported rest break x1 min after approximately 2 mins of activity ?Trunk rotations w/ cues for periscapular engagement to use shoulders and head to initiate rotation, performed x6 to each side in dec ROM ?Elbow taps x2 each side w/ therapist facilitating LUE weight bearing.  Increased time to complete task with increased independence.  Pt maintains forward trunk w/ min cuing. ? ?At end of session pt returned to w/c +2 via hoyer lift. ? ?PATIENT EDUCATION: ?Education details: Continue HEP. ?Person educated: Patient and Child(ren) ?Education method: Explanation, Demonstration, Tactile cues, Verbal cues, and Handouts ?Education comprehension: verbalized understanding, returned demonstration, verbal cues required, tactile cues required, and needs further education  ?  ?  ?HOME EXERCISE PROGRAM: ? Access Code: [email or web address] ?URL: [email or web address] ?Date: 08/18/2021 ?Prepared by: Willow Ora ? ?Exercises ?- Seated Heel Raise  - 1 x daily - 5 x weekly - 1 sets - 10 reps ?- seated hamstring stretch  - 1 x daily - 5 x weekly - 1 sets - 3 reps - 30 seconds hold ?- Supine Transversus Abdominis Bracing - Hands on Ground  - 1 x daily - 5 x weekly - 1 sets - 10 reps ?- Hip and Knee Extension and Flexion Caregiver PROM  - 1 x daily - 5 x weekly - 1 sets - 10 reps ?- Supine Knee Extension Strengthening  - 1 x daily - 5 x weekly - 1 sets - 10 reps ?- Supine Lower Trunk Rotation  - 1 x daily - 5 x weekly - 1 sets - 5-6 reps ?- Supine Hip Adduction Isometric with Ball  - 1 x daily - 5 x weekly - 1 sets - 10 reps - 3 seconds hold ?  ?  ?  ?GOALS: ?Goals reviewed with patient? Yes ?  ?SHORT TERM GOALS: Target date: 09/01/2021 ?  ?Pt will be able to perform initial HEP for ROM and strengthening with caregiver assist. ?Baseline: ?Goal status: INITIAL ?  ?2.  Pt will be able to perform lateral transfer with slideboard max  assist. ?Baseline: Hoyer lift ?Goal status: INITIAL ?  ?3.  Pt will be able to maintain sitting balance edge of mat x 2 min supervision for improved sitting balance and ADLs. ?Baseline:  ?Goal status: INITIAL ?  ?4.  Pt will be able to perform rolling to the left mod I and to the right mod assist for improved mobility and decrease caregiver burden. ?Baseline: min assist to left and total assist to right. ?Goal status: INITIAL ?  ?  ?  ?LONG TERM GOALS: Target date:  10/04/21 ?  ?Pt will be able to perform progressive HEP with caregiver assist for strengthening and ROM and balance. ?Baseline:  ?Goal status: INITIAL ?  ?2.  Pt will be able to perform slideboard transfer after board placed mod assist for improved mobility. ?Baseline:  ?Goal status: INITIAL ?  ?  3.  Pt will be able to sit edge of mat >5 min with hands in lap/performing reaching for improved balance and ADLs supervision. ?Baseline:  ?Goal status: INITIAL ?  ?4.  Pt will be able to stand at sink x 2 min min assist for improved standing ability. ?Baseline:  ?Goal status: INITIAL ?  ?5.  Pt will be able to perform sit to/from supine max assist for improved mobility. ?Baseline:  ?Goal status: INITIAL ?  ?  ?  ?ASSESSMENT: ?  ?CLINICAL IMPRESSION: ?Patient's sitting balance is much improved this session with progression of midline correction and core engagement.  Able to challenge BOS with reaching to cones and bean bag toss to promote perturbations.  Pt continues to be limited by left hemibody weakness greater than right sided weakness and decreased activity tolerance.  Will progress towards LTGs as able. ?  ?  ?OBJECTIVE IMPAIRMENTS Abnormal gait, decreased balance, decreased knowledge of use of DME, decreased mobility, decreased ROM, decreased strength, dizziness, increased edema, impaired UE functional use, and pain.  ?  ?ACTIVITY LIMITATIONS cleaning, community activity, driving, meal prep, laundry, medication management, yard work, and shopping.  ?   ?PERSONAL FACTORS Age, Time since onset of injury/illness/exacerbation, and 3+ comorbidities: hypertension, cervical DDD, bilateral DVTs admitted on 01/05/2021 for bilateral hand weakness, numbness and the left leg

## 2021-08-30 NOTE — Therapy (Signed)
?OUTPATIENT OCCUPATIONAL THERAPY TREATMENT NOTE ? ? ?Patient Name: [name] ?MRN: [id number] ?DOB:05/31/1928, 88 y.o., female ?Today's Date: 08/30/2021 ? ?PCP: Nolene Ebbs, MD  ?REFERRING PROVIDER: Nolene Ebbs, MD  ? ?END OF SESSION:  ? OT End of Session - 08/30/21 1447   ? ? Visit Number 4   ? Number of Visits 17   ? Date for OT Re-Evaluation 10/02/21   ? Authorization Type UHC Medicare and Medicaid,Covered 100% as long as she's enrolled with Medicaid  VL: Follows Medicare guidelines   ? Progress Note Due on Visit 10   ? OT Start Time 4967   ? OT Stop Time 1530   ? OT Time Calculation (min) 45 min   ? Equipment Utilized During [employer] lift, stedy   ? Activity Tolerance Patient tolerated treatment well   ? Behavior During Therapy [facility] for tasks assessed/performed   ? ?  ?  ? ?  ? ? ?Past Medical History:  ?Diagnosis Date  ? Allergic rhinitis   ? Arthritis   ? Carpal tunnel syndrome   ? Deep venous thrombosis ([place])   ? bilateral legs  ? Degenerative arthritis   ? right knee  ? Hypertension   ? Lumbar degenerative disc disease   ? Lump or mass in breast   ? Paresthesia   ? Syncope 05/24/2013  ? Vitamin D deficiency   ? ?Past Surgical History:  ?Procedure Laterality Date  ? ABDOMINAL HYSTERECTOMY    ? CARPAL TUNNEL RELEASE Right   ? CATARACT EXTRACTION Bilateral   ? HEMORROIDECTOMY    ? IVC Filter    ? TOTAL HIP ARTHROPLASTY Right   ? ULNAR NERVE TRANSPOSITION Right   ? ?Patient Active Problem List  ? Diagnosis Date Noted  ? Obesity (BMI 30-39.9) 01/28/2021  ? Acute CVA (cerebrovascular accident) ([place]) 01/25/2021  ? Acute kidney injury ([place]) 01/25/2021  ? CVA (cerebral vascular accident) ([place]) 01/06/2021  ? Essential hypertension 01/06/2021  ? Arthritis 01/06/2021  ? Pain due to onychomycosis of toenails of both feet 12/14/2018  ? Presbycusis of both ears 09/22/2015  ? Syncope 05/24/2013  ? ? ?ONSET DATE: 07/13/21 Referral date  ? ?REFERRING DIAG: Hemiplegia and hemiparesis following cerebral  infarction affecting left non-dominant side ([place])  ? ?THERAPY DIAG:  ?Hemiplegia and hemiparesis following cerebral infarction affecting left non-dominant side ([place]) ? ?Muscle weakness (generalized) ? ?Other lack of coordination ? ?Unsteadiness on feet ? ?Other disturbances of skin sensation ? ?Attention and concentration deficit ? ?Visuospatial deficit ? ? ?PERTINENT HISTORY: vertigo, smoking, hypertension, cervical DDD, bilateral DVTs  multiple strokes (09/22, 10/22) .  right MCA territory scattered infarcts.  MRI C-spine showed C3/4 severe spinal stenosis and mild spinal cord compression.  EF 60 to 65%  ? ?PRECAUTIONS: fall ? ?SUBJECTIVE: Pt's daughter continues to report the aide is continuing to assist with range of motion and stretching. ?PAIN:  ?Are you having pain? No - Patient with history of R Knee pain ? ? ? ? ?OBJECTIVE:  ? ?TODAY'S TREATMENT: ? ?TODAY'S TREATMENT:  ?08/30/21 ?Pt arrived with daughter in manual w/c with hoyer lift sling in place. Pt transferred with hoyer lift to edge of mat. Pt sat edge of mat without LOB today without UE support. Pt would sometimes demonstrate lateral and posterior lean but able to self-correct with verbal cues. Pt worked on anterior leaning with use of physioball and chair/OT for cues for leaning forward. Pt with good leaning anteriorly for progressing towards increased independence with transfers and standing.  Pt with active movement in LUE but with significant pain at wrist and hand with mobilization. Pt limited by ROM and pain in LUE and hemiparesis. Pt able to laterally flex trunk and prop on Rt forearm for scooting Lt buttocks forward on mat with min A. Pt did the same on the Lt side to scoot Rt buttocks with mod assistance for LUE placement on mat. Pt with increased sitting balance today. ? ? ?  ?  ?HOME EXERCISE PROGRAM: ?08/18/21:  Table slides BUE  ?  ?  ?  ?GOALS: ?Goals reviewed with patient? Yes ?  ?SHORT TERM GOALS: Target date: 08/31/2021 ?  ?Patient and  caregiver will complete an HEP designed to improve active range of motion in LUE ?Baseline: Stretching program ?Goal status: Ongoing ?  ?2.  Patient will demonstrate 30% composite flexion in digits ?Baseline: 20% then painful ?Goal status: Ongoing ?  ?3.  Patient will sit at edge of mat table with intermittent min assistance x 30 seconds in preparation for active participation in ADL ?Baseline: 3-5 sec  ?Goal status: Achieved ?  ?4.  Patient will lean forward while seated at edge of mat as if reaching toward shoes/socks - reach past knees 2/3 down length of calf ?Baseline: just below knee ?Goal status: Achieved ?  ?5.  Patient will wash her face, chest and left arm after set up assistance ?Baseline: Dependent ?Goal status: Ongoing ?  ?  ?LONG TERM GOALS: Target date: 09/28/2021 ?  ?Patient will complete home activity program designed to improve functional use of LUE in simple self care tasks ?Baseline: No HAP ?Goal status: Ongoing ?  ?2.  Patient will report pain no greater than 3/10 in LEFT shoulder with active reach ?Baseline: 6/10 ?Goal status: Ongoing ?  ?3.  Patient will complete an active level surface transfer with max assist ?Baseline: dependent- hoyer ?Goal status: Ongoing ?  ?4.  Patient will assist with pulling up and down lower body clothing to reduce caregiver burden for dressing ?Baseline: Dependent ?Goal status: Ongoing ?  ?5.  Patient will demonstrate at least 50% composite flexion in left hand to aide with grasp/release ?Baseline: 20% flex ?Goal status: Ongoing ?  ?  ?  ?ASSESSMENT: ?  ?CLINICAL IMPRESSION:  Pt continues to progress toward goals and demonstrates increased sitting tolerance and balance on edge of mat daily. Continue working towards mobility and progressing towards decreased burden on transfers and ADLs.  ? ? ?PERFORMANCE DEFICITS in functional skills including ADLs, IADLs, coordination, dexterity, proprioception, sensation, edema, tone, ROM, strength, pain, fascial restrictions,  muscle spasms, flexibility, FMC, GMC, mobility, balance, body mechanics, endurance, cardiopulmonary status limiting function, decreased knowledge of use of DME, UE functional use, and vestibular, cognitive skills including attention, energy/drive, memory, orientation, perception, problem solving, safety awareness, and sequencing, and psychosocial skills including environmental adaptation and habits.  ?  ?IMPAIRMENTS are limiting patient from ADLs, IADLs, rest and sleep, leisure, and social participation.  ?  ?COMORBIDITIES may have co-morbidities  that affects occupational performance. Patient will benefit from skilled OT to address above impairments and improve overall function. ?  ?MODIFICATION OR ASSISTANCE TO COMPLETE EVALUATION: Min-Moderate modification of tasks or assist with assess necessary to complete an evaluation. ?  ?OT OCCUPATIONAL PROFILE AND HISTORY: Detailed assessment: Review of records and additional review of physical, cognitive, psychosocial history related to current functional performance. ?  ?CLINICAL DECISION MAKING: Moderate - several treatment options, min-mod task modification necessary ?  ?REHAB POTENTIAL: Good ?  ?EVALUATION COMPLEXITY: Moderate ?  ?  ?  PLAN: ?OT FREQUENCY: 2x/week ?  ?OT DURATION: 8 weeks ?  ?PLANNED INTERVENTIONS: self care/ADL training, therapeutic exercise, therapeutic activity, neuromuscular re-education, manual therapy, passive range of motion, balance training, functional mobility training, splinting, electrical stimulation, ultrasound, paraffin, fluidotherapy, moist heat, cryotherapy, patient/family education, cognitive remediation/compensation, visual/perceptual remediation/compensation, energy conservation, coping strategies training, and DME and/or AE instructions ?  ?RECOMMENDED OTHER SERVICES: Receiving PT ?  ?CONSULTED AND AGREED WITH PLAN OF CARE: Patient and family member/caregiver ?  ?PLAN FOR NEXT SESSION:Check Table slides for HEP, ROM Left hand,  wrist. Hoyer to mat table for sitting balance work, continue with sitting balance and scooting on edge of mat ?  ?  ? ? ? ?Zachery Conch, OT ?08/30/2021, 4:15 PM ? ?  ? ?  ?

## 2021-09-01 ENCOUNTER — Encounter: Payer: Self-pay | Admitting: Physical Therapy

## 2021-09-01 ENCOUNTER — Encounter: Payer: Self-pay | Admitting: Occupational Therapy

## 2021-09-01 ENCOUNTER — Ambulatory Visit: Payer: Medicare Other | Admitting: Occupational Therapy

## 2021-09-01 ENCOUNTER — Ambulatory Visit: Payer: Medicare Other | Admitting: Physical Therapy

## 2021-09-01 DIAGNOSIS — R2681 Unsteadiness on feet: Secondary | ICD-10-CM

## 2021-09-01 DIAGNOSIS — I69354 Hemiplegia and hemiparesis following cerebral infarction affecting left non-dominant side: Secondary | ICD-10-CM | POA: Diagnosis not present

## 2021-09-01 DIAGNOSIS — R278 Other lack of coordination: Secondary | ICD-10-CM

## 2021-09-01 DIAGNOSIS — R41840 Attention and concentration deficit: Secondary | ICD-10-CM

## 2021-09-01 DIAGNOSIS — R208 Other disturbances of skin sensation: Secondary | ICD-10-CM

## 2021-09-01 DIAGNOSIS — R41842 Visuospatial deficit: Secondary | ICD-10-CM

## 2021-09-01 DIAGNOSIS — M6281 Muscle weakness (generalized): Secondary | ICD-10-CM

## 2021-09-01 NOTE — Therapy (Signed)
?OUTPATIENT PHYSICAL THERAPY TREATMENT NOTE ? ? ?Patient Name: [name] ?MRN: [id number] ?DOB:1928-07-24, 88 y.o., female ?Today's Date: 09/01/2021 ? ?PCP: Nolene Ebbs, MD  ?REFERRING PROVIDER: Nolene Ebbs, MD ? ?END OF SESSION:  ? PT End of Session - 09/01/21 1321   ? ? Visit Number 5   ? Number of Visits 17   ? Date for PT Re-Evaluation 10/01/21   ? Authorization Type UHC medicare so 10th visit progress note   ? PT Start Time 8315   ? PT Stop Time 1761   ? PT Time Calculation (min) 47 min   ? Equipment Utilized During Treatment Other (comment);Gait belt   [employer]; sliding board  ? Activity Tolerance Patient tolerated treatment well   ? Behavior During Therapy [facility] for tasks assessed/performed   ? ?  ?  ? ?  ? ? ? ? ?Past Medical History:  ?Diagnosis Date  ? Allergic rhinitis   ? Arthritis   ? Carpal tunnel syndrome   ? Deep venous thrombosis ([place])   ? bilateral legs  ? Degenerative arthritis   ? right knee  ? Hypertension   ? Lumbar degenerative disc disease   ? Lump or mass in breast   ? Paresthesia   ? Syncope 05/24/2013  ? Vitamin D deficiency   ? ?Past Surgical History:  ?Procedure Laterality Date  ? ABDOMINAL HYSTERECTOMY    ? CARPAL TUNNEL RELEASE Right   ? CATARACT EXTRACTION Bilateral   ? HEMORROIDECTOMY    ? IVC Filter    ? TOTAL HIP ARTHROPLASTY Right   ? ULNAR NERVE TRANSPOSITION Right   ? ?Patient Active Problem List  ? Diagnosis Date Noted  ? Obesity (BMI 30-39.9) 01/28/2021  ? Acute CVA (cerebrovascular accident) ([place]) 01/25/2021  ? Acute kidney injury ([place]) 01/25/2021  ? CVA (cerebral vascular accident) ([place]) 01/06/2021  ? Essential hypertension 01/06/2021  ? Arthritis 01/06/2021  ? Pain due to onychomycosis of toenails of both feet 12/14/2018  ? Presbycusis of both ears 09/22/2015  ? Syncope 05/24/2013  ? ? ?REFERRING DIAG: I63.9 (ICD-10-CM) - CVA (cerebral vascular accident)   ? ?THERAPY DIAG:  ?Hemiplegia and hemiparesis following cerebral infarction affecting left non-dominant side  ([place]) ? ?Muscle weakness (generalized) ? ?Other lack of coordination ? ?Unsteadiness on feet ? ?PERTINENT HISTORY: 88 year old female with history of smoking, hypertension, cervical DDD, bilateral DVTs admitted on 01/05/2021 for bilateral hand weakness, numbness and the left leg weakness.   ? ?PRECAUTIONS: Fall  ? ?SUBJECTIVE: No new complaints. No falls or pain to report at this time.  ? ?PAIN:  ?Are you having pain? No ? ? ?TODAY'S TREATMENT: ?Received from OT in short-sitting EOM.   PT maintaining upright supervision level x 48mns during discussion with daughter.  Pt better maintains midline w/o visual feedback this session. ? ?Pt performs level sliding board transfer x1 mat > w/c to right using functional right reach and cues for head-hip relationship to unweight bottom for transfer.  Requires 4 attempts to reach chair, initial 2 attempts maxA, last 2 attempts modA due to improved RUE pull on w/c.  Pt maintaining forward trunk with mod cues prior to attempt. ?  ?Once in w/c practiced scooting posterolaterally to right.  Pt has increased difficulty maintaining forward trunk to unweight bottom requiring increased multimodal cuing and maxA for 3 attempts following initial attempt requiring minA with heavy reliance on RLE to push. ? ?In sitting: ?-Left quad sets 2x6 w/ palpation at quad insertion for activation w/ increased activation noted  w/ repetition. ?-Right heel lifts w/ leg positioned midway into extension ROM at onset of activity (mimics SAQ) 2x6 ?-Ankle DF AROM 2x6 each LE w/ left improving ROM with inc reps ?-Heelslides x6 on RLE ?-Atttempts at heelslide on LLE w/ use of pillowcase on sliding board on ground; can feel greater activation at bilateral hamstring insertion than at quad insertion w/ small bursts of movement. ? ?STGs assessed.  See goal section for details. ? ? ? ?PATIENT EDUCATION: ?Education details: Discussed normal experience to loading joints that have previously been non-weight bearing and  return to PCP for follow-up for referral to ortho for right knee cortisone shot as they deem fit if right knee discomfort becomes a hinderance to therapies. ?Person educated: Patient and Child(ren) ?Education method: Explanation, Demonstration, Tactile cues, Verbal cues, and Handouts ?Education comprehension: verbalized understanding, returned demonstration, verbal cues required, tactile cues required, and needs further education  ?  ?  ?HOME EXERCISE PROGRAM: ? Access Code: [email or web address] ?URL: [email or web address] ?Date: 08/18/2021 ?Prepared by: Willow Ora ? ?Exercises ?- Seated Heel Raise  - 1 x daily - 5 x weekly - 1 sets - 10 reps ?- seated hamstring stretch  - 1 x daily - 5 x weekly - 1 sets - 3 reps - 30 seconds hold ?- Supine Transversus Abdominis Bracing - Hands on Ground  - 1 x daily - 5 x weekly - 1 sets - 10 reps ?- Hip and Knee Extension and Flexion Caregiver PROM  - 1 x daily - 5 x weekly - 1 sets - 10 reps ?- Supine Knee Extension Strengthening  - 1 x daily - 5 x weekly - 1 sets - 10 reps ?- Supine Lower Trunk Rotation  - 1 x daily - 5 x weekly - 1 sets - 5-6 reps ?- Supine Hip Adduction Isometric with Ball  - 1 x daily - 5 x weekly - 1 sets - 10 reps - 3 seconds hold ?  ?  ?  ?GOALS: ?Goals reviewed with patient? Yes ?  ?SHORT TERM GOALS: Target date: 09/01/2021 ?  ?Pt will be able to perform initial HEP for ROM and strengthening with caregiver assist. ?Baseline:  Established and compliant ?Goal status: MET ?  ?2.  Pt will be able to perform lateral transfer with slideboard max assist. ?Baseline: Hoyer lift; modA-maxA to right ?Goal status: MET ?  ?3.  Pt will be able to maintain sitting balance edge of mat x 2 min supervision for improved sitting balance and ADLs. ?Baseline: 09/01/2021 pt able to sit EOM supervision level x5 minutes ?Goal status: MET ?  ?4.  Pt will be able to perform rolling to the left mod I and to the right mod assist for improved mobility and decrease caregiver  burden. ?Baseline: min assist to left and total assist to right. 09/01/2021 from prior notes min-modA to left, maxA to right ?Goal status: NOT MET ?  ?  ?  ?LONG TERM GOALS: Target date:  10/04/21 ?  ?Pt will be able to perform progressive HEP with caregiver assist for strengthening and ROM and balance. ?Baseline:  ?Goal status: INITIAL ?  ?2.  Pt will be able to perform slideboard transfer after board placed mod assist for improved mobility. ?Baseline:  ?Goal status: INITIAL ?  ?3.  Pt will be able to sit edge of mat >5 min with hands in lap/performing reaching for improved balance and ADLs supervision. ?Baseline:  ?Goal status: INITIAL ?  ?4.  Pt will be able to stand at  sink x 2 min min assist for improved standing ability. ?Baseline:  ?Goal status: INITIAL ?  ?5.  Pt will be able to perform sit to/from supine max assist for improved mobility. ?Baseline:  ?Goal status: INITIAL ?  ?  ?  ?ASSESSMENT: ?  ?CLINICAL IMPRESSION: ?Patient demonstrates much improved sitting balance and tolerance today demonstrating only need for supervision for up to 5 minutes of static sitting EOM.  She continues to need increased assistance for rolling so will continue to address in future visits.  Daughter reports caregiver is good about performing HEP and would be capable of performing slide board transfer in future if deemed safe by therapy.  Pt requires mod-maxA at this time.  Will continue to progress transfers as able. ?  ?  ?OBJECTIVE IMPAIRMENTS Abnormal gait, decreased balance, decreased knowledge of use of DME, decreased mobility, decreased ROM, decreased strength, dizziness, increased edema, impaired UE functional use, and pain.  ?  ?ACTIVITY LIMITATIONS cleaning, community activity, driving, meal prep, laundry, medication management, yard work, and shopping.  ?  ?PERSONAL FACTORS Age, Time since onset of injury/illness/exacerbation, and 3+ comorbidities: hypertension, cervical DDD, bilateral DVTs admitted on 01/05/2021 for  bilateral hand weakness, numbness and the left leg weakness  are also affecting patient's functional outcome.  ?  ?  ?REHAB POTENTIAL: Fair   ?  ?CLINICAL DECISION MAKING: Evolving/moderate complexity ?  ?EVALUATION COM

## 2021-09-01 NOTE — Therapy (Signed)
?OUTPATIENT OCCUPATIONAL THERAPY TREATMENT NOTE ? ? ?Patient Name: [name] ?MRN: [id number] ?DOB:October 11, 1928, 88 y.o., female ?Today's Date: 09/01/2021 ? ?PCP: Nolene Ebbs, MD  ?REFERRING PROVIDER: Nolene Ebbs, MD  ? ?END OF SESSION:  ? OT End of Session - 09/01/21 1328   ? ? Visit Number 5   ? Number of Visits 17   ? Date for OT Re-Evaluation 10/02/21   ? Authorization Type [employer] Medicare and Medicaid,Covered 100% as long as she's enrolled with Medicaid  VL: Follows Medicare guidelines   ? Progress Note Due on Visit 10   ? OT Start Time 1230   ? OT Stop Time 1315   ? OT Time Calculation (min) 45 min   ? Equipment Utilized During Treatment stedy, sliding board   ? Activity Tolerance Patient tolerated treatment well   ? Behavior During Therapy [facility] for tasks assessed/performed   ? ?  ?  ? ?  ? ? ?Past Medical History:  ?Diagnosis Date  ? Allergic rhinitis   ? Arthritis   ? Carpal tunnel syndrome   ? Deep venous thrombosis ([place])   ? bilateral legs  ? Degenerative arthritis   ? right knee  ? Hypertension   ? Lumbar degenerative disc disease   ? Lump or mass in breast   ? Paresthesia   ? Syncope 05/24/2013  ? Vitamin D deficiency   ? ?Past Surgical History:  ?Procedure Laterality Date  ? ABDOMINAL HYSTERECTOMY    ? CARPAL TUNNEL RELEASE Right   ? CATARACT EXTRACTION Bilateral   ? HEMORROIDECTOMY    ? IVC Filter    ? TOTAL HIP ARTHROPLASTY Right   ? ULNAR NERVE TRANSPOSITION Right   ? ?Patient Active Problem List  ? Diagnosis Date Noted  ? Obesity (BMI 30-39.9) 01/28/2021  ? Acute CVA (cerebrovascular accident) ([place]) 01/25/2021  ? Acute kidney injury ([place]) 01/25/2021  ? CVA (cerebral vascular accident) ([place]) 01/06/2021  ? Essential hypertension 01/06/2021  ? Arthritis 01/06/2021  ? Pain due to onychomycosis of toenails of both feet 12/14/2018  ? Presbycusis of both ears 09/22/2015  ? Syncope 05/24/2013  ? ? ?ONSET DATE: 07/13/21 Referral date  ? ?REFERRING DIAG: Hemiplegia and hemiparesis following cerebral  infarction affecting left non-dominant side ([place])  ? ?THERAPY DIAG:  ?Hemiplegia and hemiparesis following cerebral infarction affecting left non-dominant side ([place]) ? ?Muscle weakness (generalized) ? ?Other lack of coordination ? ?Unsteadiness on feet ? ?Other disturbances of skin sensation ? ?Attention and concentration deficit ? ?Visuospatial deficit ? ? ?PERTINENT HISTORY: vertigo, smoking, hypertension, cervical DDD, bilateral DVTs  multiple strokes (09/22, 10/22) .  right MCA territory scattered infarcts.  MRI C-spine showed C3/4 severe spinal stenosis and mild spinal cord compression.  EF 60 to 65%  ? ?PRECAUTIONS: fall ? ?SUBJECTIVE: Pt's daughter continues to report the aide is continuing to assist with range of motion and stretching. ?PAIN:  ?Are you having pain? No - Patient with history of R Knee pain ? ? ? ? ?OBJECTIVE:  ? ?TODAY'S TREATMENT: ? ?TODAY'S TREATMENT:  ?09/01/21 ?Patient pleasant and joking today about "torture session."  Patient able to complete slide board transfer with max assist moving toward left level surface.  Cueing and facilitation for forward weight shift onto feet.  Worked on improving postural control in sitting - working on more sustained extension in trunk, and weight evenly distributed thru hips versus drifting toward right and back.  Worked on sit to partial stand with [employer]  Patient requires mod assist of  2 people to complete today - last week max/total of two and a third to control seat flaps.  Patient did report pain in left knee with partial stand task, resolved when returned to sitting.  Checked short term goals, and patient has met most of them at this point.  Encouraged patient's daughter to talk with aide about allowing Dot to participate more in her own bathing.   ? ? ? ? ?08/30/21 ?Pt arrived with daughter in manual w/c with hoyer lift sling in place. Pt transferred with hoyer lift to edge of mat. Pt sat edge of mat without LOB today without UE support. Pt  would sometimes demonstrate lateral and posterior lean but able to self-correct with verbal cues. Pt worked on anterior leaning with use of physioball and chair/OT for cues for leaning forward. Pt with good leaning anteriorly for progressing towards increased independence with transfers and standing. Pt with active movement in LUE but with significant pain at wrist and hand with mobilization. Pt limited by ROM and pain in LUE and hemiparesis. Pt able to laterally flex trunk and prop on Rt forearm for scooting Lt buttocks forward on mat with min A. Pt did the same on the Lt side to scoot Rt buttocks with mod assistance for LUE placement on mat. Pt with increased sitting balance today. ? ? ?  ?  ?HOME EXERCISE PROGRAM: ?08/18/21:  Table slides BUE  ?  ?  ?  ?GOALS: ?Goals reviewed with patient? Yes ?  ?SHORT TERM GOALS: Target date: 08/31/2021 ?  ?Patient and caregiver will complete an HEP designed to improve active range of motion in LUE ?Baseline: Stretching program ?Goal status: Achieved ?  ?2.  Patient will demonstrate 30% composite flexion in digits ?Baseline: 20% then painful ?Goal status: Achieved ?  ?3.  Patient will sit at edge of mat table with intermittent min assistance x 30 seconds in preparation for active participation in ADL ?Baseline: 3-5 sec  ?Goal status: Achieved ?  ?4.  Patient will lean forward while seated at edge of mat as if reaching toward shoes/socks - reach past knees 2/3 down length of calf ?Baseline: just below knee ?Goal status: Achieved ?  ?5.  Patient will wash her face, chest and left arm after set up assistance ?Baseline: Dependent ?Goal status: Partially met - washing face only 09/01/21 ?  ?  ?LONG TERM GOALS: Target date: 09/28/2021 ?  ?Patient will complete home activity program designed to improve functional use of LUE in simple self care tasks ?Baseline: No HAP ?Goal status: Ongoing ?  ?2.  Patient will report pain no greater than 3/10 in LEFT shoulder with active reach ?Baseline:  6/10 ?Goal status: Ongoing ?  ?3.  Patient will complete an active level surface transfer with max assist ?Baseline: dependent- hoyer ?Goal status: Ongoing ?  ?4.  Patient will assist with pulling up and down lower body clothing to reduce caregiver burden for dressing ?Baseline: Dependent ?Goal status: Ongoing ?  ?5.  Patient will demonstrate at least 50% composite flexion in left hand to aide with grasp/release ?Baseline: 20% flex ?Goal status: Ongoing ?  ?  ?  ?ASSESSMENT: ?  ?CLINICAL IMPRESSION:   ?Patient showing steady improvement with activity tolerance, transitional movement prep - leaning forward, pre-scooting activity.  Patient continues with shoulder and wrist pain with range of motion.  Patient also with significant knee pain with activity.   ? ?PERFORMANCE DEFICITS in functional skills including ADLs, IADLs, coordination, dexterity, proprioception, sensation, edema, tone, ROM,  strength, pain, fascial restrictions, muscle spasms, flexibility, FMC, GMC, mobility, balance, body mechanics, endurance, cardiopulmonary status limiting function, decreased knowledge of use of DME, UE functional use, and vestibular, cognitive skills including attention, energy/drive, memory, orientation, perception, problem solving, safety awareness, and sequencing, and psychosocial skills including environmental adaptation and habits.  ?  ?IMPAIRMENTS are limiting patient from ADLs, IADLs, rest and sleep, leisure, and social participation.  ?  ?COMORBIDITIES may have co-morbidities  that affects occupational performance. Patient will benefit from skilled OT to address above impairments and improve overall function. ?  ?MODIFICATION OR ASSISTANCE TO COMPLETE EVALUATION: Min-Moderate modification of tasks or assist with assess necessary to complete an evaluation. ?  ?OT OCCUPATIONAL PROFILE AND HISTORY: Detailed assessment: Review of records and additional review of physical, cognitive, psychosocial history related to current  functional performance. ?  ?CLINICAL DECISION MAKING: Moderate - several treatment options, min-mod task modification necessary ?  ?REHAB POTENTIAL: Good ?  ?EVALUATION COMPLEXITY: Moderate ?  ?  ?PLAN: ?OT FREQUE

## 2021-09-06 ENCOUNTER — Encounter: Payer: Self-pay | Admitting: Occupational Therapy

## 2021-09-06 ENCOUNTER — Ambulatory Visit: Payer: Medicare Other | Admitting: Physical Therapy

## 2021-09-06 ENCOUNTER — Encounter: Payer: Self-pay | Admitting: Physical Therapy

## 2021-09-06 ENCOUNTER — Ambulatory Visit: Payer: Medicare Other | Admitting: Occupational Therapy

## 2021-09-06 DIAGNOSIS — I69354 Hemiplegia and hemiparesis following cerebral infarction affecting left non-dominant side: Secondary | ICD-10-CM

## 2021-09-06 DIAGNOSIS — R2681 Unsteadiness on feet: Secondary | ICD-10-CM

## 2021-09-06 DIAGNOSIS — R278 Other lack of coordination: Secondary | ICD-10-CM

## 2021-09-06 DIAGNOSIS — M6281 Muscle weakness (generalized): Secondary | ICD-10-CM

## 2021-09-06 DIAGNOSIS — R41840 Attention and concentration deficit: Secondary | ICD-10-CM

## 2021-09-06 DIAGNOSIS — R208 Other disturbances of skin sensation: Secondary | ICD-10-CM

## 2021-09-06 NOTE — Therapy (Signed)
OUTPATIENT OCCUPATIONAL THERAPY TREATMENT NOTE   Patient Name: [name] MRN: [id number] DOB:[birth year], 88 y.o., female Today's Date: 09/06/2021  PCP: Nolene Ebbs, MD  REFERRING PROVIDER: Nolene Ebbs, MD   END OF SESSION:   OT End of Session - 09/06/21 1403     Visit Number 6    Number of Visits 17    Date for OT Re-Evaluation 10/02/21    Authorization Type UHC Medicare and Medicaid,Covered 100% as long as she's enrolled with Medicaid  VL: Follows Medicare guidelines    Progress Note Due on Visit 10    OT Start Time 1400    OT Stop Time 4098    OT Time Calculation (min) 45 min    Equipment Utilized During Treatment stedy, sliding board    Activity Tolerance Patient tolerated treatment well    Behavior During Therapy WFL for tasks assessed/performed             Past Medical History:  Diagnosis Date   Allergic rhinitis    Arthritis    Carpal tunnel syndrome    Deep venous thrombosis ([facility])    bilateral legs   Degenerative arthritis    right knee   Hypertension    Lumbar degenerative disc disease    Lump or mass in breast    Paresthesia    Syncope 05/24/2013   Vitamin D deficiency    Past Surgical History:  Procedure Laterality Date   ABDOMINAL HYSTERECTOMY     CARPAL TUNNEL RELEASE Right    CATARACT EXTRACTION Bilateral    HEMORROIDECTOMY     IVC Filter     TOTAL HIP ARTHROPLASTY Right    ULNAR NERVE TRANSPOSITION Right    Patient Active Problem List   Diagnosis Date Noted   Obesity (BMI 30-39.9) 01/28/2021   Acute CVA (cerebrovascular accident) ([place]) 01/25/2021   Acute kidney injury ([place]) 01/25/2021   CVA (cerebral vascular accident) ([place]) 01/06/2021   Essential hypertension 01/06/2021   Arthritis 01/06/2021   Pain due to onychomycosis of toenails of both feet 12/14/2018   Presbycusis of both ears 09/22/2015   Syncope 05/24/2013    ONSET DATE: 07/13/21 Referral date   REFERRING DIAG: Hemiplegia and hemiparesis following cerebral  infarction affecting left non-dominant side ([place])   THERAPY DIAG:  Hemiplegia and hemiparesis following cerebral infarction affecting left non-dominant side ([facility])  Muscle weakness (generalized)  Other lack of coordination  Unsteadiness on feet  Other disturbances of skin sensation  Attention and concentration deficit   PERTINENT HISTORY: vertigo, smoking, hypertension, cervical DDD, bilateral DVTs  multiple strokes (09/22, 10/22) .  right MCA territory scattered infarcts.  MRI C-spine showed C3/4 severe spinal stenosis and mild spinal cord compression.  EF 60 to 65%   PRECAUTIONS: fall  SUBJECTIVE: Pt reports liking the brace issued last session.  PAIN:  Are you having pain? No - Patient with history of R Knee pain     OBJECTIVE:   TODAY'S TREATMENT:  TODAY'S TREATMENT:  09/06/21 Sliding Board Transfer from w/c to edge of mat with increased time, max verbal cues and max assistance to left side.   Self PROM to LUE with forward reaching and reaching to floor. Pt demonstrates lateral lean to Rt while seated edge of mat unassisted. Uses RUE for support frequently. Pt worked on trunk/hip dissociation in sitting with alternating weight shifts.   Lateral scoots to right with max/total assistance.   Bed Mobility with sitting edge of mat to supine on bed with transitioning to right with  max A for management for BLE and managing torso onto pillows.      HOME EXERCISE PROGRAM: 08/18/21:  Table slides BUE        GOALS: Goals reviewed with patient? Yes   SHORT TERM GOALS: Target date: 08/31/2021   Patient and caregiver will complete an HEP designed to improve active range of motion in LUE Baseline: Stretching program Goal status: Achieved   2.  Patient will demonstrate 30% composite flexion in digits Baseline: 20% then painful Goal status: Achieved   3.  Patient will sit at edge of mat table with intermittent min assistance x 30 seconds in preparation for active  participation in ADL Baseline: 3-5 sec  Goal status: Achieved   4.  Patient will lean forward while seated at edge of mat as if reaching toward shoes/socks - reach past knees 2/3 down length of calf Baseline: just below knee Goal status: Achieved   5.  Patient will wash her face, chest and left arm after set up assistance Baseline: Dependent Goal status: Partially met - washing face only 09/01/21     LONG TERM GOALS: Target date: 09/28/2021   Patient will complete home activity program designed to improve functional use of LUE in simple self care tasks Baseline: No HAP Goal status: Ongoing   2.  Patient will report pain no greater than 3/10 in LEFT shoulder with active reach Baseline: 6/10 Goal status: Ongoing   3.  Patient will complete an active level surface transfer with max assist Baseline: dependent- hoyer Goal status: Ongoing   4.  Patient will assist with pulling up and down lower body clothing to reduce caregiver burden for dressing Baseline: Dependent Goal status: Ongoing   5.  Patient will demonstrate at least 50% composite flexion in left hand to aide with grasp/release Baseline: 20% flex Goal status: Ongoing       ASSESSMENT:   CLINICAL IMPRESSION:   Patient showing steady improvement with activity tolerance, transitional movement prep - leaning forward, pre-scooting activity.    PERFORMANCE DEFICITS in functional skills including ADLs, IADLs, coordination, dexterity, proprioception, sensation, edema, tone, ROM, strength, pain, fascial restrictions, muscle spasms, flexibility, FMC, GMC, mobility, balance, body mechanics, endurance, cardiopulmonary status limiting function, decreased knowledge of use of DME, UE functional use, and vestibular, cognitive skills including attention, energy/drive, memory, orientation, perception, problem solving, safety awareness, and sequencing, and psychosocial skills including environmental adaptation and habits.    IMPAIRMENTS  are limiting patient from ADLs, IADLs, rest and sleep, leisure, and social participation.    COMORBIDITIES may have co-morbidities  that affects occupational performance. Patient will benefit from skilled OT to address above impairments and improve overall function.   MODIFICATION OR ASSISTANCE TO COMPLETE EVALUATION: Min-Moderate modification of tasks or assist with assess necessary to complete an evaluation.   OT OCCUPATIONAL PROFILE AND HISTORY: Detailed assessment: Review of records and additional review of physical, cognitive, psychosocial history related to current functional performance.   CLINICAL DECISION MAKING: Moderate - several treatment options, min-mod task modification necessary   REHAB POTENTIAL: Good   EVALUATION COMPLEXITY: Moderate     PLAN: OT FREQUENCY: 2x/week   OT DURATION: 8 weeks   PLANNED INTERVENTIONS: self care/ADL training, therapeutic exercise, therapeutic activity, neuromuscular re-education, manual therapy, passive range of motion, balance training, functional mobility training, splinting, electrical stimulation, ultrasound, paraffin, fluidotherapy, moist heat, cryotherapy, patient/family education, cognitive remediation/compensation, visual/perceptual remediation/compensation, energy conservation, coping strategies training, and DME and/or AE instructions   RECOMMENDED OTHER SERVICES: Receiving PT   CONSULTED  AND AGREED WITH PLAN OF CARE: Patient and family member/caregiver   PLAN FOR NEXT SESSION:  Could benefit from a custom night splint, resting hand splint.   SLide board transfer if feasible or hoyer, scooting, sit to stand in [place], Upper body dressing      Zachery Conch, OT 09/06/2021, 2:04 PM

## 2021-09-06 NOTE — Therapy (Unsigned)
OUTPATIENT PHYSICAL THERAPY TREATMENT NOTE   Patient Name: [name] MRN: [id number] DOB:1928/07/02, 88 y.o., female Today's Date: 09/07/2021  PCP: Nolene Ebbs, MD  REFERRING PROVIDER: Nolene Ebbs, MD  END OF SESSION:   PT End of Session - 09/06/21 1450     Visit Number 6    Number of Visits 17    Date for PT Re-Evaluation 10/01/21    Authorization Type [employer] medicare so 10th visit progress note    PT Start Time 1448    PT Stop Time 1528    PT Time Calculation (min) 40 min    Equipment Utilized During Treatment Other (comment);Gait belt   Hoyer; sliding board   Activity Tolerance Patient tolerated treatment well    Behavior During Therapy WFL for tasks assessed/performed               Past Medical History:  Diagnosis Date   Allergic rhinitis    Arthritis    Carpal tunnel syndrome    Deep venous thrombosis (HCC)    bilateral legs   Degenerative arthritis    right knee   Hypertension    Lumbar degenerative disc disease    Lump or mass in breast    Paresthesia    Syncope 05/24/2013   Vitamin D deficiency    Past Surgical History:  Procedure Laterality Date   ABDOMINAL HYSTERECTOMY     CARPAL TUNNEL RELEASE Right    CATARACT EXTRACTION Bilateral    HEMORROIDECTOMY     IVC Filter     TOTAL HIP ARTHROPLASTY Right    ULNAR NERVE TRANSPOSITION Right    Patient Active Problem List   Diagnosis Date Noted   Obesity (BMI 30-39.9) 01/28/2021   Acute CVA (cerebrovascular accident) ([place]) 01/25/2021   Acute kidney injury ([place]) 01/25/2021   CVA (cerebral vascular accident) ([place]) 01/06/2021   Essential hypertension 01/06/2021   Arthritis 01/06/2021   Pain due to onychomycosis of toenails of both feet 12/14/2018   Presbycusis of both ears 09/22/2015   Syncope 05/24/2013    REFERRING DIAG: I63.9 (ICD-10-CM) - CVA (cerebral vascular accident)    THERAPY DIAG:  Hemiplegia and hemiparesis following cerebral infarction affecting left non-dominant side  (HCC)  Muscle weakness (generalized)  Other lack of coordination  Unsteadiness on feet  PERTINENT HISTORY: 88 year old female with history of smoking, hypertension, cervical DDD, bilateral DVTs admitted on 01/05/2021 for bilateral hand weakness, numbness and the left leg weakness.    PRECAUTIONS: Fall   SUBJECTIVE: No new complaints. No falls.  Daughter reports being unable to reposition pt when caregiver is not there.  He (caregiver) is only present 2 hours a day.  PAIN:  Are you having pain? Yes: NPRS scale: 8/10 Pain location: right knee Pain description: soreness Aggravating factors: moving it Relieving factors: resting   TODAY'S TREATMENT: Received from OT in supine on mat.      Initiated session w/ heel lifts from hooklying over bolster 2x5 each LE Therapist places BLE in hooklying w/o bolster support and blocks BLE so pt can attempt bridging w/ cuing for increased glut engagement.  Performed 2x5 w/ 3sec hold to promote inc muscle recruitment. Progressed to supine roll initiation L and R w/ pt still requiring cues for left UE management into right side-lying, performed x3 each direction maxA for all w/ good initiation noticed when cued to shrug shoulders and initiate roll with head. Maintained side-lying requiring modA on left, mod-maxA on right; returned to supine w/ therapist providing periscapular resistance through range  In supine, pt cued for core engagement and to use functional forward reach and attempt to clear scapula off pillow.  Pt attempts 3x3 w/ progressively decreased initiation and clearance so activity d/c'd.  Pt mildly distractible during this task.  Able to initiate return to EOM with head and shoulder movement, but only for 1 second bursts then returns to supine requiring therapist assistance. Pt is TotalA +2 for trunk and LE management into sitting EOM from supine.    Pt performs downhill sliding board transfer x1 mat > w/c to left cued to push with RUE  requiring max+2 assistance.  Pt maintains forward trunk without cues and recalls head hip relationship prior to transfer.  R knee causing some discomfort post-transfer.     Once in w/c therapists provided single totalA +2 boost to scoot posterolaterally into chair due to pt fatigue.  Therapist places Bilateral feet onto leg rests.    PATIENT EDUCATION: Education details: Edu on repositioning pt to prevent skin breakdown as able. Person educated: Patient and Child(ren) Education method: Explanation and Demonstration Education comprehension: verbalized understanding and needs further education      HOME EXERCISE PROGRAM:  Access Code: [email or web address] URL: [email or web address] Date: 08/18/2021 Prepared by: Willow Ora  Exercises - Seated Heel Raise  - 1 x daily - 5 x weekly - 1 sets - 10 reps - seated hamstring stretch  - 1 x daily - 5 x weekly - 1 sets - 3 reps - 30 seconds hold - Supine Transversus Abdominis Bracing - Hands on Ground  - 1 x daily - 5 x weekly - 1 sets - 10 reps - Hip and Knee Extension and Flexion Caregiver PROM  - 1 x daily - 5 x weekly - 1 sets - 10 reps - Supine Knee Extension Strengthening  - 1 x daily - 5 x weekly - 1 sets - 10 reps - Supine Lower Trunk Rotation  - 1 x daily - 5 x weekly - 1 sets - 5-6 reps - Supine Hip Adduction Isometric with Ball  - 1 x daily - 5 x weekly - 1 sets - 10 reps - 3 seconds hold       GOALS: Goals reviewed with patient? Yes   SHORT TERM GOALS: Target date: 09/01/2021   Pt will be able to perform initial HEP for ROM and strengthening with caregiver assist. Baseline:  Established and compliant Goal status: MET   2.  Pt will be able to perform lateral transfer with slideboard max assist. Baseline: Hoyer lift; modA-maxA to right Goal status: MET   3.  Pt will be able to maintain sitting balance edge of mat x 2 min supervision for improved sitting balance and ADLs. Baseline: 09/01/2021 pt able to sit EOM supervision  level x5 minutes Goal status: MET   4.  Pt will be able to perform rolling to the left mod I and to the right mod assist for improved mobility and decrease caregiver burden. Baseline: min assist to left and total assist to right. 09/01/2021 from prior notes min-modA to left, maxA to right Goal status: NOT MET       LONG TERM GOALS: Target date:  10/04/21   Pt will be able to perform progressive HEP with caregiver assist for strengthening and ROM and balance. Baseline:  Goal status: INITIAL   2.  Pt will be able to perform slideboard transfer after board placed mod assist for improved mobility. Baseline:  Goal status: INITIAL   3.  Pt will be  able to sit edge of mat >5 min with hands in lap/performing reaching for improved balance and ADLs supervision. Baseline:  Goal status: INITIAL   4.  Pt will be able to stand at sink x 2 min min assist for improved standing ability. Baseline:  Goal status: INITIAL   5.  Pt will be able to perform sit to/from supine max assist for improved mobility. Baseline:  Goal status: INITIAL       ASSESSMENT:   CLINICAL IMPRESSION: Focus of skilled session on bed mobility and continuing to progress core, shoulder, and cervical engagement to roll and initiation of other bed mobility.  Continued to work on LE activation in supine.  Pt notably fatigued throughout requiring increased assistance for all mobility today despite being more consistently able to activate large muscle groups in LE during therapeutic exercises.  Will continue to address deficits per POC.     OBJECTIVE IMPAIRMENTS Abnormal gait, decreased balance, decreased knowledge of use of DME, decreased mobility, decreased ROM, decreased strength, dizziness, increased edema, impaired UE functional use, and pain.    ACTIVITY LIMITATIONS cleaning, community activity, driving, meal prep, laundry, medication management, yard work, and shopping.    PERSONAL FACTORS Age, Time since onset of  injury/illness/exacerbation, and 3+ comorbidities: hypertension, cervical DDD, bilateral DVTs admitted on 01/05/2021 for bilateral hand weakness, numbness and the left leg weakness  are also affecting patient's functional outcome.      REHAB POTENTIAL: Fair     CLINICAL DECISION MAKING: Evolving/moderate complexity   EVALUATION COMPLEXITY: Moderate   PLAN: PT FREQUENCY: 2x/week   PT DURATION: 8 weeks   PLANNED INTERVENTIONS: Therapeutic exercises, Therapeutic activity, Neuromuscular re-education, Balance training, Gait training, Patient/Family education, Joint mobilization, Vestibular training, Canalith repositioning, Orthotic/Fit training, DME instructions, Wheelchair mobility training, Cryotherapy, Moist heat, and Manual therapy   PLAN FOR NEXT SESSION:  Sliding board transfers.  Weight shifting, practice multi-directional scooting EOM.  Continue multidirectional reaching and further assess trunk rotation in sitting.  Stand w/ steadi.  Bed mobility. Is Hoyer lift transfer at this time-progressing to sliding board (continue to assess/work on). Sitting EOM.  If 2 people practice transition supine <>sit.  Continue to work on [place] strengthening in sitting and supine.  Address IR/ER of hip in supine, further stretching-especially Left ankle, glut/quad sets, bridging, heel slides-use tapping at tendon insertion, trunk rotations in sitting    Elease Etienne, PT, DPT  [facility] [address], [place], [place] [address] [phone] 09/07/21, 1:01 PM

## 2021-09-08 ENCOUNTER — Ambulatory Visit: Payer: Medicare Other | Admitting: Physical Therapy

## 2021-09-08 ENCOUNTER — Ambulatory Visit: Payer: Medicare Other | Admitting: Occupational Therapy

## 2021-09-08 ENCOUNTER — Encounter: Payer: Self-pay | Admitting: Occupational Therapy

## 2021-09-08 DIAGNOSIS — R2681 Unsteadiness on feet: Secondary | ICD-10-CM

## 2021-09-08 DIAGNOSIS — M6281 Muscle weakness (generalized): Secondary | ICD-10-CM

## 2021-09-08 DIAGNOSIS — R41840 Attention and concentration deficit: Secondary | ICD-10-CM

## 2021-09-08 DIAGNOSIS — I69354 Hemiplegia and hemiparesis following cerebral infarction affecting left non-dominant side: Secondary | ICD-10-CM | POA: Diagnosis not present

## 2021-09-08 DIAGNOSIS — R278 Other lack of coordination: Secondary | ICD-10-CM

## 2021-09-08 DIAGNOSIS — R208 Other disturbances of skin sensation: Secondary | ICD-10-CM

## 2021-09-08 NOTE — Therapy (Signed)
OUTPATIENT OCCUPATIONAL THERAPY TREATMENT NOTE   Patient Name: [name] MRN: [id number] DOB:[birth year], 88 y.o., female Today's Date: 09/08/2021  PCP: Nolene Ebbs, MD  REFERRING PROVIDER: Nolene Ebbs, MD   END OF SESSION:   OT End of Session - 09/08/21 1335     Visit Number 7    Number of Visits 17    Date for OT Re-Evaluation 10/02/21    Authorization Type UHC Medicare and Medicaid,Covered 100% as long as she's enrolled with Medicaid  VL: Follows Medicare guidelines    Progress Note Due on Visit 10    OT Start Time 1145    OT Stop Time 36    OT Time Calculation (min) 45 min    Equipment Utilized During Treatment sliding board, UE Ranger    Activity Tolerance Patient tolerated treatment well    Behavior During Therapy WFL for tasks assessed/performed             Past Medical History:  Diagnosis Date   Allergic rhinitis    Arthritis    Carpal tunnel syndrome    Deep venous thrombosis (HCC)    bilateral legs   Degenerative arthritis    right knee   Hypertension    Lumbar degenerative disc disease    Lump or mass in breast    Paresthesia    Syncope 05/24/2013   Vitamin D deficiency    Past Surgical History:  Procedure Laterality Date   ABDOMINAL HYSTERECTOMY     CARPAL TUNNEL RELEASE Right    CATARACT EXTRACTION Bilateral    HEMORROIDECTOMY     IVC Filter     TOTAL HIP ARTHROPLASTY Right    ULNAR NERVE TRANSPOSITION Right    Patient Active Problem List   Diagnosis Date Noted   Obesity (BMI 30-39.9) 01/28/2021   Acute CVA (cerebrovascular accident) ([place]) 01/25/2021   Acute kidney injury ([place]) 01/25/2021   CVA (cerebral vascular accident) ([place]) 01/06/2021   Essential hypertension 01/06/2021   Arthritis 01/06/2021   Pain due to onychomycosis of toenails of both feet 12/14/2018   Presbycusis of both ears 09/22/2015   Syncope 05/24/2013    ONSET DATE: 07/13/21 Referral date   REFERRING DIAG: Hemiplegia and hemiparesis following cerebral  infarction affecting left non-dominant side ([place])   THERAPY DIAG:  Hemiplegia and hemiparesis following cerebral infarction affecting left non-dominant side (HCC)  Muscle weakness (generalized)  Other lack of coordination  Unsteadiness on feet  Other disturbances of skin sensation  Attention and concentration deficit   PERTINENT HISTORY: vertigo, smoking, hypertension, cervical DDD, bilateral DVTs  multiple strokes (09/22, 10/22) .  right MCA territory scattered infarcts.  MRI C-spine showed C3/4 severe spinal stenosis and mild spinal cord compression.  EF 60 to 65%   PRECAUTIONS: fall  SUBJECTIVE: Pt reports liking the brace issued last session.  PAIN:  Are you having pain? No - Patient with history of R Knee pain     OBJECTIVE:   TODAY'S TREATMENT:  TODAY'S TREATMENT:  09/08/21: Sliding Board Transfer from w/c to edge of mat with increased time, max verbal cues and max assistance to right side.  ADL:  Patient is minimally participating in dressing tasks at home.  Practiced doffing and donning pull over shirt with patient.  Patient required cueing and intermittent minimal assistance as well as increased time for tasks.  Shared with daughter that patient should practice at home - please pass on to caregiver.  NMR:  UE ranger and supportive mobile surface to address LUE active movement.  Cueing and facilitation to decrease trunk movement to move left arm.      09/06/21 Sliding Board Transfer from w/c to edge of mat with increased time, max verbal cues and max assistance to left side.   Self PROM to LUE with forward reaching and reaching to floor. Pt demonstrates lateral lean to Rt while seated edge of mat unassisted. Uses RUE for support frequently. Pt worked on trunk/hip dissociation in sitting with alternating weight shifts.   Lateral scoots to right with max/total assistance.   Bed Mobility with sitting edge of mat to supine on bed with transitioning to right with  max A for management for BLE and managing torso onto pillows.      HOME EXERCISE PROGRAM: 08/18/21:  Table slides BUE  09/08/21:  ADL - Donning/doffing pull over shirt        GOALS: Goals reviewed with patient? Yes   SHORT TERM GOALS: Target date: 08/31/2021   Patient and caregiver will complete an HEP designed to improve active range of motion in LUE Baseline: Stretching program Goal status: Achieved   2.  Patient will demonstrate 30% composite flexion in digits Baseline: 20% then painful Goal status: Achieved   3.  Patient will sit at edge of mat table with intermittent min assistance x 30 seconds in preparation for active participation in ADL Baseline: 3-5 sec  Goal status: Achieved   4.  Patient will lean forward while seated at edge of mat as if reaching toward shoes/socks - reach past knees 2/3 down length of calf Baseline: just below knee Goal status: Achieved   5.  Patient will wash her face, chest and left arm after set up assistance Baseline: Dependent Goal status: Partially met - washing face only 09/01/21     LONG TERM GOALS: Target date: 09/28/2021   Patient will complete home activity program designed to improve functional use of LUE in simple self care tasks Baseline: No HAP Goal status: Ongoing   2.  Patient will report pain no greater than 3/10 in LEFT shoulder with active reach Baseline: 6/10 Goal status: Ongoing   3.  Patient will complete an active level surface transfer with max assist Baseline: dependent- hoyer Goal status: Ongoing   4.  Patient will assist with pulling up and down lower body clothing to reduce caregiver burden for dressing Baseline: Dependent Goal status: Ongoing   5.  Patient will demonstrate at least 50% composite flexion in left hand to aide with grasp/release Baseline: 20% flex Goal status: Ongoing       ASSESSMENT:   CLINICAL IMPRESSION:   Patient showing improved participation with simple ADL tasks - working to  get this to carryover to home with paid caregiver.    PERFORMANCE DEFICITS in functional skills including ADLs, IADLs, coordination, dexterity, proprioception, sensation, edema, tone, ROM, strength, pain, fascial restrictions, muscle spasms, flexibility, FMC, GMC, mobility, balance, body mechanics, endurance, cardiopulmonary status limiting function, decreased knowledge of use of DME, UE functional use, and vestibular, cognitive skills including attention, energy/drive, memory, orientation, perception, problem solving, safety awareness, and sequencing, and psychosocial skills including environmental adaptation and habits.    IMPAIRMENTS are limiting patient from ADLs, IADLs, rest and sleep, leisure, and social participation.    COMORBIDITIES may have co-morbidities  that affects occupational performance. Patient will benefit from skilled OT to address above impairments and improve overall function.   MODIFICATION OR ASSISTANCE TO COMPLETE EVALUATION: Min-Moderate modification of tasks or assist with assess necessary to complete an evaluation.  OT OCCUPATIONAL PROFILE AND HISTORY: Detailed assessment: Review of records and additional review of physical, cognitive, psychosocial history related to current functional performance.   CLINICAL DECISION MAKING: Moderate - several treatment options, min-mod task modification necessary   REHAB POTENTIAL: Good   EVALUATION COMPLEXITY: Moderate     PLAN: OT FREQUENCY: 2x/week   OT DURATION: 8 weeks   PLANNED INTERVENTIONS: self care/ADL training, therapeutic exercise, therapeutic activity, neuromuscular re-education, manual therapy, passive range of motion, balance training, functional mobility training, splinting, electrical stimulation, ultrasound, paraffin, fluidotherapy, moist heat, cryotherapy, patient/family education, cognitive remediation/compensation, visual/perceptual remediation/compensation, energy conservation, coping strategies training,  and DME and/or AE instructions   RECOMMENDED OTHER SERVICES: Receiving PT   CONSULTED AND AGREED WITH PLAN OF CARE: Patient and family member/caregiver   PLAN FOR NEXT SESSION:  Could benefit from a custom resting hand splint.   Slide board transfer if feasible or hoyer, scooting, sit to stand in [place], Upper body dressing      Mariah Milling, OT 09/08/2021, 1:36 PM

## 2021-09-08 NOTE — Therapy (Signed)
OUTPATIENT PHYSICAL THERAPY TREATMENT NOTE   Patient Name: [name] MRN: [id number] DOB:05-15-1928, 88 y.o., female Today's Date: 09/08/2021  PCP: Nolene Ebbs, MD  REFERRING PROVIDER: Nolene Ebbs, MD  END OF SESSION:   PT End of Session - 09/08/21 1233     Visit Number 7    Number of Visits 17    Date for PT Re-Evaluation 10/01/21    Authorization Type [employer] medicare so 10th visit progress note    PT Start Time 1232    PT Stop Time 1315    PT Time Calculation (min) 43 min    Equipment Utilized During Treatment Other (comment);Gait belt   Hoyer; sliding board   Activity Tolerance Patient tolerated treatment well    Behavior During Therapy WFL for tasks assessed/performed                Past Medical History:  Diagnosis Date   Allergic rhinitis    Arthritis    Carpal tunnel syndrome    Deep venous thrombosis (HCC)    bilateral legs   Degenerative arthritis    right knee   Hypertension    Lumbar degenerative disc disease    Lump or mass in breast    Paresthesia    Syncope 05/24/2013   Vitamin D deficiency    Past Surgical History:  Procedure Laterality Date   ABDOMINAL HYSTERECTOMY     CARPAL TUNNEL RELEASE Right    CATARACT EXTRACTION Bilateral    HEMORROIDECTOMY     IVC Filter     TOTAL HIP ARTHROPLASTY Right    ULNAR NERVE TRANSPOSITION Right    Patient Active Problem List   Diagnosis Date Noted   Obesity (BMI 30-39.9) 01/28/2021   Acute CVA (cerebrovascular accident) ([place]) 01/25/2021   Acute kidney injury ([place]) 01/25/2021   CVA (cerebral vascular accident) ([place]) 01/06/2021   Essential hypertension 01/06/2021   Arthritis 01/06/2021   Pain due to onychomycosis of toenails of both feet 12/14/2018   Presbycusis of both ears 09/22/2015   Syncope 05/24/2013    REFERRING DIAG: I63.9 (ICD-10-CM) - CVA (cerebral vascular accident)    THERAPY DIAG:  Hemiplegia and hemiparesis following cerebral infarction affecting left non-dominant side  (HCC)  Muscle weakness (generalized)  PERTINENT HISTORY: 88 year old female with history of smoking, hypertension, cervical DDD, bilateral DVTs admitted on 01/05/2021 for bilateral hand weakness, numbness and the left leg weakness.    PRECAUTIONS: Fall   SUBJECTIVE: No new complaints. Is tired today.   PAIN:  Are you having pain? Yes: NPRS scale: 8/10 Pain location: right knee Pain description: soreness Aggravating factors: moving it Relieving factors: resting  VITALS: All BP readings taken in RUE while seated  -Mid-session: BP 171/76 mmHg, HR 58 bpm  TODAY'S TREATMENT:  NMR   Pt received sitting edge of mat, handoff w/OT   -Part practice anterior weight shift w/target to facilitate forward lean for slide board transfer and improved core strength. Therapist sat in front of pt in armrest chair and instructed pt to reach forward to reach towards armrests quickly to facilitate use of momentum. Max multimodal cues provided for sequencing and form, therapist braced LUE throughout. Pt practiced x15 anterior weight shifts and was able to demonstrate quick reach x3. Noted pt walking RUE up her thigh to assist w/trunk extension.   - Mass part practice of anterolateral lean to R side w/push-away using RUE to facilitate lateral scoot to L side at edge of mat for slide board transfer. Max multimodal cues (emphasis on  visual) for proper technique, as pt unable to understand how to use momentum to scoot laterally. Pt able to demonstrate 2-3 leans w/good technique and progress towards lateral scoot. Max verbal cues to remind pt to push away w/RUE, as she has tendency to pull instead   -Downhill lateral board transfer to L side from mat to [facility] w/mod A for assistance w/lateral scoot. Pt able to demonstrate high amplitude forward lean w/push w/RUE well. Once in chair, max A x2 (two therapists) to reposition hips in chair.    PATIENT EDUCATION: Education details: Continue HEP, thorough education  throughout session regarding head-hips relationship  Person educated: Patient and Child(ren) Education method: Customer service manager Education comprehension: verbalized understanding and needs further education      HOME EXERCISE PROGRAM:  Access Code: [email or web address] URL: [email or web address] Date: 08/18/2021 Prepared by: Willow Ora  Exercises - Seated Heel Raise  - 1 x daily - 5 x weekly - 1 sets - 10 reps - seated hamstring stretch  - 1 x daily - 5 x weekly - 1 sets - 3 reps - 30 seconds hold - Supine Transversus Abdominis Bracing - Hands on Ground  - 1 x daily - 5 x weekly - 1 sets - 10 reps - Hip and Knee Extension and Flexion Caregiver PROM  - 1 x daily - 5 x weekly - 1 sets - 10 reps - Supine Knee Extension Strengthening  - 1 x daily - 5 x weekly - 1 sets - 10 reps - Supine Lower Trunk Rotation  - 1 x daily - 5 x weekly - 1 sets - 5-6 reps - Supine Hip Adduction Isometric with Ball  - 1 x daily - 5 x weekly - 1 sets - 10 reps - 3 seconds hold       GOALS: Goals reviewed with patient? Yes   SHORT TERM GOALS: Target date: 09/01/2021   Pt will be able to perform initial HEP for ROM and strengthening with caregiver assist. Baseline:  Established and compliant Goal status: MET   2.  Pt will be able to perform lateral transfer with slideboard max assist. Baseline: Hoyer lift; modA-maxA to right Goal status: MET   3.  Pt will be able to maintain sitting balance edge of mat x 2 min supervision for improved sitting balance and ADLs. Baseline: 09/01/2021 pt able to sit EOM supervision level x5 minutes Goal status: MET   4.  Pt will be able to perform rolling to the left mod I and to the right mod assist for improved mobility and decrease caregiver burden. Baseline: min assist to left and total assist to right. 09/01/2021 from prior notes min-modA to left, maxA to right Goal status: NOT MET       LONG TERM GOALS: Target date:  10/04/21   Pt will be able to  perform progressive HEP with caregiver assist for strengthening and ROM and balance. Baseline:  Goal status: INITIAL   2.  Pt will be able to perform slideboard transfer after board placed mod assist for improved mobility. Baseline:  Goal status: INITIAL   3.  Pt will be able to sit edge of mat >5 min with hands in lap/performing reaching for improved balance and ADLs supervision. Baseline:  Goal status: INITIAL   4.  Pt will be able to stand at sink x 2 min min assist for improved standing ability. Baseline:  Goal status: INITIAL   5.  Pt will be able to perform sit to/from supine max assist  for improved mobility. Baseline:  Goal status: INITIAL       ASSESSMENT:   CLINICAL IMPRESSION: Emphasis of skilled PT session on part-practice lateral scooting on mat for improved slide board transfers. Pt required max multimodal cues throughout for body mechanics and high amplitude movement to utilize momentum for scooting. Pt then able to perform downhill board transfer to L side w/mod A, noting good anterolateral weight shift and push away w/RUE. Continue POC.      OBJECTIVE IMPAIRMENTS Abnormal gait, decreased balance, decreased knowledge of use of DME, decreased mobility, decreased ROM, decreased strength, dizziness, increased edema, impaired UE functional use, and pain.    ACTIVITY LIMITATIONS cleaning, community activity, driving, meal prep, laundry, medication management, yard work, and shopping.    PERSONAL FACTORS Age, Time since onset of injury/illness/exacerbation, and 3+ comorbidities: hypertension, cervical DDD, bilateral DVTs admitted on 01/05/2021 for bilateral hand weakness, numbness and the left leg weakness  are also affecting patient's functional outcome.      REHAB POTENTIAL: Fair     CLINICAL DECISION MAKING: Evolving/moderate complexity   EVALUATION COMPLEXITY: Moderate   PLAN: PT FREQUENCY: 2x/week   PT DURATION: 8 weeks   PLANNED INTERVENTIONS: Therapeutic  exercises, Therapeutic activity, Neuromuscular re-education, Balance training, Gait training, Patient/Family education, Joint mobilization, Vestibular training, Canalith repositioning, Orthotic/Fit training, DME instructions, Wheelchair mobility training, Cryotherapy, Moist heat, and Manual therapy   PLAN FOR NEXT SESSION:  Sliding board transfers.  Weight shifting, practice multi-directional scooting EOM.  Continue multidirectional reaching and further assess trunk rotation in sitting.  Stand w/ steadi.  Bed mobility. Is Hoyer lift transfer at this time-progressing to sliding board (continue to assess/work on). Sitting EOM.  If 2 people practice transition supine <>sit.  Continue to work on [place] strengthening in sitting and supine.  Address IR/ER of hip in supine, further stretching-especially Left ankle, glut/quad sets, bridging, heel slides-use tapping at tendon insertion, trunk rotations in sitting    Keiona Jenison E Mayford Alberg, PT, DPT 09/08/21, 1:41 PM

## 2021-09-14 ENCOUNTER — Ambulatory Visit: Payer: Medicare Other | Admitting: Occupational Therapy

## 2021-09-14 ENCOUNTER — Encounter: Payer: Self-pay | Admitting: Physical Therapy

## 2021-09-14 ENCOUNTER — Ambulatory Visit: Payer: Medicare Other | Admitting: Physical Therapy

## 2021-09-14 ENCOUNTER — Encounter: Payer: Self-pay | Admitting: Occupational Therapy

## 2021-09-14 DIAGNOSIS — I69354 Hemiplegia and hemiparesis following cerebral infarction affecting left non-dominant side: Secondary | ICD-10-CM | POA: Diagnosis not present

## 2021-09-14 DIAGNOSIS — R2681 Unsteadiness on feet: Secondary | ICD-10-CM

## 2021-09-14 DIAGNOSIS — R278 Other lack of coordination: Secondary | ICD-10-CM

## 2021-09-14 DIAGNOSIS — M6281 Muscle weakness (generalized): Secondary | ICD-10-CM

## 2021-09-14 NOTE — Patient Instructions (Signed)
Your Splint This splint should initially be fitted by a healthcare practitioner.  The healthcare practitioner is responsible for providing wearing instructions and precautions to the patient, other healthcare practitioners and care provider involved in the patient's care.  This splint was custom made for you. Please read the following instructions to learn about wearing and caring for your splint.  Precautions Should your splint cause any of the following problems, remove the splint immediately and contact your therapist/physician. Swelling Severe Pain Pressure Areas Stiffness Numbness  Keep splint away from heat sources and pets  When To Wear Your Splint Where your splint according to your therapist/physician instructions. Begin wearing 1 hour on, 1 hour off. If no problems, then continue to add an hour on (2 hours on, 1 hour off; 3 hours on, 1 hour off; etc). Once pt can tolerate 4 consecutive hours, then patient may try and wear at night as well.   Care and Cleaning of Your Splint Keep your splint away from open flames. Your splint will lose its shape in temperatures over 135 degrees Farenheit,  (car windows, near radiators, ovens or in hot water) so keep away from extreme heat   Never make any adjustments to your splint, if the splint needs adjusting remove it and make an appointment to see your therapist. Your splint may be cleaned with rubbing alcohol.  Do not immerse in hot water over 135 degrees Farenheit.

## 2021-09-14 NOTE — Therapy (Signed)
OUTPATIENT PHYSICAL THERAPY TREATMENT NOTE   Patient Name: [name] MRN: [id number] DOB:11/16/1928, 88 y.o., female Today's Date: 09/14/2021  PCP: Nolene Ebbs, MD  REFERRING PROVIDER: Nolene Ebbs, MD  END OF SESSION:   PT End of Session - 09/14/21 1403     Visit Number 8    Number of Visits 17    Date for PT Re-Evaluation 10/01/21    Authorization Type [employer] medicare so 10th visit progress note    PT Start Time 1402   received from OT   PT Stop Time 1431    PT Time Calculation (min) 29 min    Equipment Utilized During Treatment Other (comment);Gait belt   Hoyer; sliding board   Activity Tolerance Patient limited by fatigue   Pt has difficulty staying awake, falls asleep during seated tasks.   Behavior During Therapy Flat affect   Sleepy               Past Medical History:  Diagnosis Date   Allergic rhinitis    Arthritis    Carpal tunnel syndrome    Deep venous thrombosis (HCC)    bilateral legs   Degenerative arthritis    right knee   Hypertension    Lumbar degenerative disc disease    Lump or mass in breast    Paresthesia    Syncope 05/24/2013   Vitamin D deficiency    Past Surgical History:  Procedure Laterality Date   ABDOMINAL HYSTERECTOMY     CARPAL TUNNEL RELEASE Right    CATARACT EXTRACTION Bilateral    HEMORROIDECTOMY     IVC Filter     TOTAL HIP ARTHROPLASTY Right    ULNAR NERVE TRANSPOSITION Right    Patient Active Problem List   Diagnosis Date Noted   Obesity (BMI 30-39.9) 01/28/2021   Acute CVA (cerebrovascular accident) ([place]) 01/25/2021   Acute kidney injury ([place]) 01/25/2021   CVA (cerebral vascular accident) ([place]) 01/06/2021   Essential hypertension 01/06/2021   Arthritis 01/06/2021   Pain due to onychomycosis of toenails of both feet 12/14/2018   Presbycusis of both ears 09/22/2015   Syncope 05/24/2013    REFERRING DIAG: I63.9 (ICD-10-CM) - CVA (cerebral vascular accident)    THERAPY DIAG:  Hemiplegia and hemiparesis  following cerebral infarction affecting left non-dominant side (HCC)  Muscle weakness (generalized)  Other lack of coordination  Unsteadiness on feet  PERTINENT HISTORY: 88 year old female with history of smoking, hypertension, cervical DDD, bilateral DVTs admitted on 01/05/2021 for bilateral hand weakness, numbness and the left leg weakness.    PRECAUTIONS: Fall   SUBJECTIVE: Pt is sleepy today, daughter reports giving her Benadryl about 12pm today due to itchiness from rash.  PAIN:  Are you having pain? Yes: NPRS scale: 8/10 Pain location: right knee Pain description: soreness Aggravating factors: moving it Relieving factors: resting  VITALS: All BP readings taken in RUE while seated  -Pre-session: BP 129/62 mmHg, HR 56 bpm  TODAY'S TREATMENT:   Pt received in W/C from OT.  Attempted to initiate sliding board transfer to right to mat table, but pt having difficulty remaining awake to engage in task.  Had pt perform quad sets and seated heel raises x8 each LE seated in w/c.  Re-attempted transfer with pt totalA for LE positioning, unable to place board properly as pt demonstrates difficulty leaning forward and laterally to left.  Regressed to working on forward lean and lateral leans in sitting with pt requiring maxA to obtain and maintain lean.  Time allotted to  provide cold water to pt per daughter's request.  Pt demonstrates increased alertness following.   Session regressed to working on [place] in sitting with pt demonstrating improved ROM on LLE for initial reps, completed 2x8 each LE. Addressed LLE heel slides 2x8 w/ decreased ROM noted using towel for frictionless surface. Seated toe raises x15 with increased time to engage LLE to task.   PATIENT EDUCATION: Education details: Continue HEP. Person educated: Patient and Child(ren) Education method: Explanation and Demonstration Education comprehension: verbalized understanding and needs further education      HOME EXERCISE  PROGRAM:  Access Code: [email or web address] URL: [email or web address] Date: 08/18/2021 Prepared by: Willow Ora  Exercises - Seated Heel Raise  - 1 x daily - 5 x weekly - 1 sets - 10 reps - seated hamstring stretch  - 1 x daily - 5 x weekly - 1 sets - 3 reps - 30 seconds hold - Supine Transversus Abdominis Bracing - Hands on Ground  - 1 x daily - 5 x weekly - 1 sets - 10 reps - Hip and Knee Extension and Flexion Caregiver PROM  - 1 x daily - 5 x weekly - 1 sets - 10 reps - Supine Knee Extension Strengthening  - 1 x daily - 5 x weekly - 1 sets - 10 reps - Supine Lower Trunk Rotation  - 1 x daily - 5 x weekly - 1 sets - 5-6 reps - Supine Hip Adduction Isometric with Ball  - 1 x daily - 5 x weekly - 1 sets - 10 reps - 3 seconds hold       GOALS: Goals reviewed with patient? Yes   SHORT TERM GOALS: Target date: 09/01/2021   Pt will be able to perform initial HEP for ROM and strengthening with caregiver assist. Baseline:  Established and compliant Goal status: MET   2.  Pt will be able to perform lateral transfer with slideboard max assist. Baseline: Hoyer lift; modA-maxA to right Goal status: MET   3.  Pt will be able to maintain sitting balance edge of mat x 2 min supervision for improved sitting balance and ADLs. Baseline: 09/01/2021 pt able to sit EOM supervision level x5 minutes Goal status: MET   4.  Pt will be able to perform rolling to the left mod I and to the right mod assist for improved mobility and decrease caregiver burden. Baseline: min assist to left and total assist to right. 09/01/2021 from prior notes min-modA to left, maxA to right Goal status: NOT MET       LONG TERM GOALS: Target date:  10/04/21   Pt will be able to perform progressive HEP with caregiver assist for strengthening and ROM and balance. Baseline:  Goal status: INITIAL   2.  Pt will be able to perform slideboard transfer after board placed mod assist for improved mobility. Baseline:  Goal  status: INITIAL   3.  Pt will be able to sit edge of mat >5 min with hands in lap/performing reaching for improved balance and ADLs supervision. Baseline:  Goal status: INITIAL   4.  Pt will be able to stand at sink x 2 min min assist for improved standing ability. Baseline:  Goal status: INITIAL   5.  Pt will be able to perform sit to/from supine max assist for improved mobility. Baseline:  Goal status: INITIAL       ASSESSMENT:   CLINICAL IMPRESSION: Session significantly limited by drowsiness this session.  Regressed session to seated therex to address  BLE strengthening as able.  Will continue to address deficits per POC as able in coming sessions.     OBJECTIVE IMPAIRMENTS Abnormal gait, decreased balance, decreased knowledge of use of DME, decreased mobility, decreased ROM, decreased strength, dizziness, increased edema, impaired UE functional use, and pain.    ACTIVITY LIMITATIONS cleaning, community activity, driving, meal prep, laundry, medication management, yard work, and shopping.    PERSONAL FACTORS Age, Time since onset of injury/illness/exacerbation, and 3+ comorbidities: hypertension, cervical DDD, bilateral DVTs admitted on 01/05/2021 for bilateral hand weakness, numbness and the left leg weakness  are also affecting patient's functional outcome.      REHAB POTENTIAL: Fair     CLINICAL DECISION MAKING: Evolving/moderate complexity   EVALUATION COMPLEXITY: Moderate   PLAN: PT FREQUENCY: 2x/week   PT DURATION: 8 weeks   PLANNED INTERVENTIONS: Therapeutic exercises, Therapeutic activity, Neuromuscular re-education, Balance training, Gait training, Patient/Family education, Joint mobilization, Vestibular training, Canalith repositioning, Orthotic/Fit training, DME instructions, Wheelchair mobility training, Cryotherapy, Moist heat, and Manual therapy   PLAN FOR NEXT SESSION:  Sliding board transfers.  Weight shifting, practice multi-directional scooting EOM.   Continue multidirectional reaching and further assess trunk rotation in sitting.  Stand w/ steadi.  Bed mobility. Is Hoyer lift transfer at this time-progressing to sliding board (continue to assess/work on). Sitting EOM.  If 2 people practice transition supine <>sit.  Continue to work on [place] strengthening in sitting and supine.  Address IR/ER of hip in supine, further stretching-especially Left ankle, glut/quad sets, bridging, heel slides-use tapping at tendon insertion, trunk rotations in sitting    Elease Etienne, PT, DPT  09/14/21, 3:04 PM

## 2021-09-14 NOTE — Therapy (Signed)
OUTPATIENT OCCUPATIONAL THERAPY TREATMENT NOTE   Patient Name: [name] MRN: [id number] DOB:1928-06-06, 88 y.o., female Today's Date: 09/14/2021  PCP: Nolene Ebbs, MD  REFERRING PROVIDER: Nolene Ebbs, MD   END OF SESSION:   OT End of Session - 09/14/21 1315     Visit Number 8    Number of Visits 17    Date for OT Re-Evaluation 10/02/21    Authorization Type [employer] Medicare and Medicaid,Covered 100% as long as she's enrolled with Medicaid  VL: Follows Medicare guidelines    Progress Note Due on Visit 10    OT Start Time 1315    OT Stop Time 1400    OT Time Calculation (min) 45 min    Equipment Utilized During Treatment sliding board, UE Ranger    Activity Tolerance Patient tolerated treatment well    Behavior During Therapy WFL for tasks assessed/performed             Past Medical History:  Diagnosis Date   Allergic rhinitis    Arthritis    Carpal tunnel syndrome    Deep venous thrombosis (HCC)    bilateral legs   Degenerative arthritis    right knee   Hypertension    Lumbar degenerative disc disease    Lump or mass in breast    Paresthesia    Syncope 05/24/2013   Vitamin D deficiency    Past Surgical History:  Procedure Laterality Date   ABDOMINAL HYSTERECTOMY     CARPAL TUNNEL RELEASE Right    CATARACT EXTRACTION Bilateral    HEMORROIDECTOMY     IVC Filter     TOTAL HIP ARTHROPLASTY Right    ULNAR NERVE TRANSPOSITION Right    Patient Active Problem List   Diagnosis Date Noted   Obesity (BMI 30-39.9) 01/28/2021   Acute CVA (cerebrovascular accident) ([place]) 01/25/2021   Acute kidney injury ([place]) 01/25/2021   CVA (cerebral vascular accident) ([place]) 01/06/2021   Essential hypertension 01/06/2021   Arthritis 01/06/2021   Pain due to onychomycosis of toenails of both feet 12/14/2018   Presbycusis of both ears 09/22/2015   Syncope 05/24/2013    ONSET DATE: 07/13/21 Referral date   REFERRING DIAG: Hemiplegia and hemiparesis following cerebral  infarction affecting left non-dominant side ([place])   THERAPY DIAG:  Hemiplegia and hemiparesis following cerebral infarction affecting left non-dominant side ([place])   PERTINENT HISTORY: vertigo, smoking, hypertension, cervical DDD, bilateral DVTs  multiple strokes (09/22, 10/22) .  right MCA territory scattered infarcts.  MRI C-spine showed C3/4 severe spinal stenosis and mild spinal cord compression.  EF 60 to 65%   PRECAUTIONS: fall  SUBJECTIVE: Daughter verbalized understanding with splint wear and care  PAIN:  Are you having pain? No - Patient with history of R Knee pain     OBJECTIVE:   TODAY'S TREATMENT:  TODAY'S TREATMENT:   Fabricated and fitted Lt resting hand splint and issued. Reviewed wear and care with patient/daughter. Pt very drowsy during O.T. session and asleep for much of session.   PATIENT EDUCATION: Education details: splint wear and care Person educated: Patient and Caregiver Education method: Explanation and Handouts Education comprehension: verbalized understanding    HOME EXERCISE PROGRAM: 08/18/21:  Table slides BUE  09/08/21:  ADL - Donning/doffing pull over shirt   09/14/21: splint wear and care     GOALS: Goals reviewed with patient? Yes   SHORT TERM GOALS: Target date: 08/31/2021   Patient and caregiver will complete an HEP designed to improve active range of motion  in LUE Baseline: Stretching program Goal status: Achieved   2.  Patient will demonstrate 30% composite flexion in digits Baseline: 20% then painful Goal status: Achieved   3.  Patient will sit at edge of mat table with intermittent min assistance x 30 seconds in preparation for active participation in ADL Baseline: 3-5 sec  Goal status: Achieved   4.  Patient will lean forward while seated at edge of mat as if reaching toward shoes/socks - reach past knees 2/3 down length of calf Baseline: just below knee Goal status: Achieved   5.  Patient will wash her face, chest and  left arm after set up assistance Baseline: Dependent Goal status: Partially met - washing face only 09/01/21     LONG TERM GOALS: Target date: 09/28/2021   Patient will complete home activity program designed to improve functional use of LUE in simple self care tasks Baseline: No HAP Goal status: Ongoing   2.  Patient will report pain no greater than 3/10 in LEFT shoulder with active reach Baseline: 6/10 Goal status: Ongoing   3.  Patient will complete an active level surface transfer with max assist Baseline: dependent- hoyer Goal status: Ongoing   4.  Patient will assist with pulling up and down lower body clothing to reduce caregiver burden for dressing Baseline: Dependent Goal status: Ongoing   5.  Patient will demonstrate at least 50% composite flexion in left hand to aide with grasp/release Baseline: 20% flex Goal status: Ongoing       ASSESSMENT:   CLINICAL IMPRESSION:   Patient showing improved participation with simple ADL tasks - working to get this to carryover to home with paid caregiver.    PERFORMANCE DEFICITS in functional skills including ADLs, IADLs, coordination, dexterity, proprioception, sensation, edema, tone, ROM, strength, pain, fascial restrictions, muscle spasms, flexibility, FMC, GMC, mobility, balance, body mechanics, endurance, cardiopulmonary status limiting function, decreased knowledge of use of DME, UE functional use, and vestibular, cognitive skills including attention, energy/drive, memory, orientation, perception, problem solving, safety awareness, and sequencing, and psychosocial skills including environmental adaptation and habits.    IMPAIRMENTS are limiting patient from ADLs, IADLs, rest and sleep, leisure, and social participation.    COMORBIDITIES may have co-morbidities  that affects occupational performance. Patient will benefit from skilled OT to address above impairments and improve overall function.   MODIFICATION OR ASSISTANCE TO  COMPLETE EVALUATION: Min-Moderate modification of tasks or assist with assess necessary to complete an evaluation.   OT OCCUPATIONAL PROFILE AND HISTORY: Detailed assessment: Review of records and additional review of physical, cognitive, psychosocial history related to current functional performance.   CLINICAL DECISION MAKING: Moderate - several treatment options, min-mod task modification necessary   REHAB POTENTIAL: Good   EVALUATION COMPLEXITY: Moderate     PLAN: OT FREQUENCY: 2x/week   OT DURATION: 8 weeks   PLANNED INTERVENTIONS: self care/ADL training, therapeutic exercise, therapeutic activity, neuromuscular re-education, manual therapy, passive range of motion, balance training, functional mobility training, splinting, electrical stimulation, ultrasound, paraffin, fluidotherapy, moist heat, cryotherapy, patient/family education, cognitive remediation/compensation, visual/perceptual remediation/compensation, energy conservation, coping strategies training, and DME and/or AE instructions   RECOMMENDED OTHER SERVICES: Receiving PT   CONSULTED AND AGREED WITH PLAN OF CARE: Patient and family member/caregiver   PLAN FOR NEXT SESSION:  assess splint and adjust prn, slide board transfer if feasible or hoyer, scooting, sit to stand in [place], Upper body dressing      Hans Eden, OT 09/14/2021, 1:15 PM  Redmond Baseman, OTR/L 09/14/21 2:10  PM Phone [phone] FAX [phone]

## 2021-09-16 ENCOUNTER — Ambulatory Visit: Payer: Medicare Other | Admitting: Physical Therapy

## 2021-09-16 ENCOUNTER — Ambulatory Visit: Payer: Medicare Other | Admitting: Occupational Therapy

## 2021-09-16 NOTE — Therapy (Incomplete)
OUTPATIENT OCCUPATIONAL THERAPY TREATMENT NOTE   Patient Name: [name] MRN: [id number] DOB:[birth year], 88 y.o., female Today's Date: 09/16/2021  PCP: Nolene Ebbs, MD  REFERRING PROVIDER: Nolene Ebbs, MD   END OF SESSION:     Past Medical History:  Diagnosis Date   Allergic rhinitis    Arthritis    Carpal tunnel syndrome    Deep venous thrombosis ([place])    bilateral legs   Degenerative arthritis    right knee   Hypertension    Lumbar degenerative disc disease    Lump or mass in breast    Paresthesia    Syncope 05/24/2013   Vitamin D deficiency    Past Surgical History:  Procedure Laterality Date   ABDOMINAL HYSTERECTOMY     CARPAL TUNNEL RELEASE Right    CATARACT EXTRACTION Bilateral    HEMORROIDECTOMY     IVC Filter     TOTAL HIP ARTHROPLASTY Right    ULNAR NERVE TRANSPOSITION Right    Patient Active Problem List   Diagnosis Date Noted   Obesity (BMI 30-39.9) 01/28/2021   Acute CVA (cerebrovascular accident) ([place]) 01/25/2021   Acute kidney injury ([place]) 01/25/2021   CVA (cerebral vascular accident) ([place]) 01/06/2021   Essential hypertension 01/06/2021   Arthritis 01/06/2021   Pain due to onychomycosis of toenails of both feet 12/14/2018   Presbycusis of both ears 09/22/2015   Syncope 05/24/2013    ONSET DATE: 07/13/21 Referral date   REFERRING DIAG: Hemiplegia and hemiparesis following cerebral infarction affecting left non-dominant side ([place])   THERAPY DIAG:  No diagnosis found.   PERTINENT HISTORY: vertigo, smoking, hypertension, cervical DDD, bilateral DVTs  multiple strokes (09/22, 10/22) .  right MCA territory scattered infarcts.  MRI C-spine showed C3/4 severe spinal stenosis and mild spinal cord compression.  EF 60 to 65%   PRECAUTIONS: fall  SUBJECTIVE: Daughter verbalized understanding with splint wear and care  PAIN:  Are you having pain? No - Patient with history of R Knee pain     OBJECTIVE:   TODAY'S  TREATMENT:  TODAY'S TREATMENT:   Fabricated and fitted Lt resting hand splint and issued. Reviewed wear and care with patient/daughter. Pt very drowsy during O.T. session and asleep for much of session.   PATIENT EDUCATION: Education details: splint wear and care Person educated: Patient and Caregiver Education method: Explanation and Handouts Education comprehension: verbalized understanding    HOME EXERCISE PROGRAM: 08/18/21:  Table slides BUE  09/08/21:  ADL - Donning/doffing pull over shirt   09/14/21: splint wear and care     GOALS: Goals reviewed with patient? Yes   SHORT TERM GOALS: Target date: 08/31/2021   Patient and caregiver will complete an HEP designed to improve active range of motion in LUE Baseline: Stretching program Goal status: Achieved   2.  Patient will demonstrate 30% composite flexion in digits Baseline: 20% then painful Goal status: Achieved   3.  Patient will sit at edge of mat table with intermittent min assistance x 30 seconds in preparation for active participation in ADL Baseline: 3-5 sec  Goal status: Achieved   4.  Patient will lean forward while seated at edge of mat as if reaching toward shoes/socks - reach past knees 2/3 down length of calf Baseline: just below knee Goal status: Achieved   5.  Patient will wash her face, chest and left arm after set up assistance Baseline: Dependent Goal status: Partially met - washing face only 09/01/21     LONG TERM GOALS: Target  date: 09/28/2021   Patient will complete home activity program designed to improve functional use of LUE in simple self care tasks Baseline: No HAP Goal status: Ongoing   2.  Patient will report pain no greater than 3/10 in LEFT shoulder with active reach Baseline: 6/10 Goal status: Ongoing   3.  Patient will complete an active level surface transfer with max assist Baseline: dependent- hoyer Goal status: Ongoing   4.  Patient will assist with pulling up and down  lower body clothing to reduce caregiver burden for dressing Baseline: Dependent Goal status: Ongoing   5.  Patient will demonstrate at least 50% composite flexion in left hand to aide with grasp/release Baseline: 20% flex Goal status: Ongoing       ASSESSMENT:   CLINICAL IMPRESSION:   Patient showing improved participation with simple ADL tasks - working to get this to carryover to home with paid caregiver.    PERFORMANCE DEFICITS in functional skills including ADLs, IADLs, coordination, dexterity, proprioception, sensation, edema, tone, ROM, strength, pain, fascial restrictions, muscle spasms, flexibility, FMC, GMC, mobility, balance, body mechanics, endurance, cardiopulmonary status limiting function, decreased knowledge of use of DME, UE functional use, and vestibular, cognitive skills including attention, energy/drive, memory, orientation, perception, problem solving, safety awareness, and sequencing, and psychosocial skills including environmental adaptation and habits.    IMPAIRMENTS are limiting patient from ADLs, IADLs, rest and sleep, leisure, and social participation.    COMORBIDITIES may have co-morbidities  that affects occupational performance. Patient will benefit from skilled OT to address above impairments and improve overall function.   MODIFICATION OR ASSISTANCE TO COMPLETE EVALUATION: Min-Moderate modification of tasks or assist with assess necessary to complete an evaluation.   OT OCCUPATIONAL PROFILE AND HISTORY: Detailed assessment: Review of records and additional review of physical, cognitive, psychosocial history related to current functional performance.   CLINICAL DECISION MAKING: Moderate - several treatment options, min-mod task modification necessary   REHAB POTENTIAL: Good   EVALUATION COMPLEXITY: Moderate     PLAN: OT FREQUENCY: 2x/week   OT DURATION: 8 weeks   PLANNED INTERVENTIONS: self care/ADL training, therapeutic exercise, therapeutic  activity, neuromuscular re-education, manual therapy, passive range of motion, balance training, functional mobility training, splinting, electrical stimulation, ultrasound, paraffin, fluidotherapy, moist heat, cryotherapy, patient/family education, cognitive remediation/compensation, visual/perceptual remediation/compensation, energy conservation, coping strategies training, and DME and/or AE instructions   RECOMMENDED OTHER SERVICES: Receiving PT   CONSULTED AND AGREED WITH PLAN OF CARE: Patient and family member/caregiver   PLAN FOR NEXT SESSION:  assess splint and adjust prn, slide board transfer if feasible or hoyer, scooting, sit to stand in [place], Upper body dressing      Jaysin Gayler, OT 09/16/2021, 9:12 AM  09/16/21 9:12 AM Phone [phone] FAX [phone]

## 2021-09-20 ENCOUNTER — Ambulatory Visit: Payer: Medicare Other | Attending: Internal Medicine | Admitting: Physical Therapy

## 2021-09-20 ENCOUNTER — Encounter: Payer: Self-pay | Admitting: Physical Therapy

## 2021-09-20 ENCOUNTER — Encounter: Payer: Self-pay | Admitting: Occupational Therapy

## 2021-09-20 ENCOUNTER — Ambulatory Visit: Payer: Medicare Other | Admitting: Occupational Therapy

## 2021-09-20 DIAGNOSIS — R278 Other lack of coordination: Secondary | ICD-10-CM | POA: Diagnosis present

## 2021-09-20 DIAGNOSIS — I69354 Hemiplegia and hemiparesis following cerebral infarction affecting left non-dominant side: Secondary | ICD-10-CM | POA: Diagnosis present

## 2021-09-20 DIAGNOSIS — R2681 Unsteadiness on feet: Secondary | ICD-10-CM | POA: Diagnosis present

## 2021-09-20 DIAGNOSIS — R208 Other disturbances of skin sensation: Secondary | ICD-10-CM | POA: Diagnosis present

## 2021-09-20 DIAGNOSIS — M6281 Muscle weakness (generalized): Secondary | ICD-10-CM | POA: Diagnosis present

## 2021-09-20 DIAGNOSIS — R2689 Other abnormalities of gait and mobility: Secondary | ICD-10-CM | POA: Insufficient documentation

## 2021-09-20 DIAGNOSIS — R41840 Attention and concentration deficit: Secondary | ICD-10-CM | POA: Diagnosis present

## 2021-09-20 DIAGNOSIS — R41842 Visuospatial deficit: Secondary | ICD-10-CM | POA: Diagnosis present

## 2021-09-20 NOTE — Therapy (Signed)
OUTPATIENT OCCUPATIONAL THERAPY TREATMENT NOTE   Patient Name: [name] MRN: [id number] DOB:1928-09-23, 88 y.o., female Today's Date: 09/20/2021  PCP: Nolene Ebbs, MD  REFERRING PROVIDER: Nolene Ebbs, MD   END OF SESSION:   OT End of Session - 09/20/21 1234     Visit Number 9    Number of Visits 17    Date for OT Re-Evaluation 10/02/21    Authorization Type [employer]HC Medicare and Medicaid,Covered 100% as long as she's enrolled with Medicaid  VL: Follows Medicare guidelines    Progress Note Due on Visit 10    OT Start Time 73    OT Stop Time 1315    OT Time Calculation (min) 45 min    Equipment Utilized During Treatment sliding board, UE Ranger    Activity Tolerance Patient tolerated treatment well    Behavior During Therapy WFL for tasks assessed/performed             Past Medical History:  Diagnosis Date   Allergic rhinitis    Arthritis    Carpal tunnel syndrome    Deep venous thrombosis (HCC)    bilateral legs   Degenerative arthritis    right knee   Hypertension    Lumbar degenerative disc disease    Lump or mass in breast    Paresthesia    Syncope 05/24/2013   Vitamin D deficiency    Past Surgical History:  Procedure Laterality Date   ABDOMINAL HYSTERECTOMY     CARPAL TUNNEL RELEASE Right    CATARACT EXTRACTION Bilateral    HEMORROIDECTOMY     IVC Filter     TOTAL HIP ARTHROPLASTY Right    ULNAR NERVE TRANSPOSITION Right    Patient Active Problem List   Diagnosis Date Noted   Obesity (BMI 30-39.9) 01/28/2021   Acute CVA (cerebrovascular accident) ([place]) 01/25/2021   Acute kidney injury ([place]) 01/25/2021   CVA (cerebral vascular accident) ([place]) 01/06/2021   Essential hypertension 01/06/2021   Arthritis 01/06/2021   Pain due to onychomycosis of toenails of both feet 12/14/2018   Presbycusis of both ears 09/22/2015   Syncope 05/24/2013    ONSET DATE: 07/13/21 Referral date   REFERRING DIAG: Hemiplegia and hemiparesis following cerebral  infarction affecting left non-dominant side ([place])   THERAPY DIAG:  Hemiplegia and hemiparesis following cerebral infarction affecting left non-dominant side ([place])   PERTINENT HISTORY: vertigo, smoking, hypertension, cervical DDD, bilateral DVTs  multiple strokes (09/22, 10/22) .  right MCA territory scattered infarcts.  MRI C-spine showed C3/4 severe spinal stenosis and mild spinal cord compression.  EF 60 to 65%   PRECAUTIONS: fall  SUBJECTIVE: Daughter reports she needs patient sitting up more at home, but doesn't have enough help at home to get her up and spends most of the day in bed  PAIN:  Are you having pain? Yes NPRS scale: 0/10 at rest, fluctuates w/ movement Pain location: knees Pain orientation: Bilateral  PAIN TYPE: aching Pain description: intermittent  Aggravating factors: passive movement  Relieving factors: rest, injections      OBJECTIVE:   TODAY'S TREATMENT:  TODAY'S TREATMENT:   Transfer from w/c to mat using sliding board w/ max assist, mod cues and extra time.  Worked on trunk balance and dynamic sitting edge of mat - going onto Rt elbow and coming up x 2, going onto Lt elbow w/ therapist supporting arm and coming up x 2, reaching towards floor w/ self stretch to LUE and coming up x 2, and going into semi  reclined position and coming up x 5 w/ min assist.  Attempted standing w/ Steady lift w/ max assist x 2 and 3rd person in back to assist prn however pt unable to come up sufficiently enough. Pt may benefit from Judson Roch?  Transfer back to w/c w/ mod to max assist x 2 using sliding board    HOME EXERCISE PROGRAM: 08/18/21:  Table slides BUE  09/08/21:  ADL - Donning/doffing pull over shirt   09/14/21: splint wear and care     GOALS: Goals reviewed with patient? Yes   SHORT TERM GOALS: Target date: 08/31/2021   Patient and caregiver will complete an HEP designed to improve active range of motion in LUE Baseline: Stretching program Goal status:  Achieved   2.  Patient will demonstrate 30% composite flexion in digits Baseline: 20% then painful Goal status: Achieved   3.  Patient will sit at edge of mat table with intermittent min assistance x 30 seconds in preparation for active participation in ADL Baseline: 3-5 sec  Goal status: Achieved   4.  Patient will lean forward while seated at edge of mat as if reaching toward shoes/socks - reach past knees 2/3 down length of calf Baseline: just below knee Goal status: Achieved   5.  Patient will wash her face, chest and left arm after set up assistance Baseline: Dependent Goal status: Partially met - washing face only 09/01/21     LONG TERM GOALS: Target date: 09/28/2021   Patient will complete home activity program designed to improve functional use of LUE in simple self care tasks Baseline: No HAP Goal status: Ongoing   2.  Patient will report pain no greater than 3/10 in LEFT shoulder with active reach Baseline: 6/10 Goal status: Ongoing   3.  Patient will complete an active level surface transfer with max assist Baseline: dependent- hoyer Goal status: Ongoing   4.  Patient will assist with pulling up and down lower body clothing to reduce caregiver burden for dressing Baseline: Dependent Goal status: Ongoing   5.  Patient will demonstrate at least 50% composite flexion in left hand to aide with grasp/release Baseline: 20% flex Goal status: Ongoing       ASSESSMENT:   CLINICAL IMPRESSION:   Patient limited by poor mobility, flaccid Lt side, and obesity. However, pt more alert today and stayed engaged entire session. Pt w/ improved endurance during today's session    PERFORMANCE DEFICITS in functional skills including ADLs, IADLs, coordination, dexterity, proprioception, sensation, edema, tone, ROM, strength, pain, fascial restrictions, muscle spasms, flexibility, FMC, GMC, mobility, balance, body mechanics, endurance, cardiopulmonary status limiting function,  decreased knowledge of use of DME, UE functional use, and vestibular, cognitive skills including attention, energy/drive, memory, orientation, perception, problem solving, safety awareness, and sequencing, and psychosocial skills including environmental adaptation and habits.    IMPAIRMENTS are limiting patient from ADLs, IADLs, rest and sleep, leisure, and social participation.    COMORBIDITIES may have co-morbidities  that affects occupational performance. Patient will benefit from skilled OT to address above impairments and improve overall function.   MODIFICATION OR ASSISTANCE TO COMPLETE EVALUATION: Min-Moderate modification of tasks or assist with assess necessary to complete an evaluation.   OT OCCUPATIONAL PROFILE AND HISTORY: Detailed assessment: Review of records and additional review of physical, cognitive, psychosocial history related to current functional performance.   CLINICAL DECISION MAKING: Moderate - several treatment options, min-mod task modification necessary   REHAB POTENTIAL: Good   EVALUATION COMPLEXITY: Moderate  PLAN: OT FREQUENCY: 2x/week   OT DURATION: 8 weeks   PLANNED INTERVENTIONS: self care/ADL training, therapeutic exercise, therapeutic activity, neuromuscular re-education, manual therapy, passive range of motion, balance training, functional mobility training, splinting, electrical stimulation, ultrasound, paraffin, fluidotherapy, moist heat, cryotherapy, patient/family education, cognitive remediation/compensation, visual/perceptual remediation/compensation, energy conservation, coping strategies training, and DME and/or AE instructions   RECOMMENDED OTHER SERVICES: Receiving PT   CONSULTED AND AGREED WITH PLAN OF CARE: Patient and family member/caregiver   PLAN FOR NEXT SESSION:  10th progress note, slide board transfer, scooting, sit to stand in [place]?, Upper body dressing      Hans Eden, OT 09/20/2021, 2:24 PM  Redmond Baseman,  OTR/L 09/20/21 2:24 PM Phone [phone] FAX [phone]

## 2021-09-20 NOTE — Therapy (Signed)
OUTPATIENT PHYSICAL THERAPY TREATMENT NOTE   Patient Name: [name] MRN: [id number] DOB:[birth year], 88 y.o., female Today's Date: 09/20/2021  PCP: Nolene Ebbs, MD  REFERRING PROVIDER: Nolene Ebbs, MD  END OF SESSION:   PT End of Session - 09/20/21 1152     Visit Number 9    Number of Visits 17    Date for PT Re-Evaluation 10/01/21    Authorization Type [employer] medicare so 10th visit progress note    PT Start Time 1149    PT Stop Time 1230    PT Time Calculation (min) 41 min    Equipment Utilized During Treatment Other (comment);Gait belt   Hoyer; sliding board   Activity Tolerance Patient limited by fatigue   Pt has difficulty staying awake, falls asleep during seated tasks.   Behavior During Therapy WFL for tasks assessed/performed                Past Medical History:  Diagnosis Date   Allergic rhinitis    Arthritis    Carpal tunnel syndrome    Deep venous thrombosis (HCC)    bilateral legs   Degenerative arthritis    right knee   Hypertension    Lumbar degenerative disc disease    Lump or mass in breast    Paresthesia    Syncope 05/24/2013   Vitamin D deficiency    Past Surgical History:  Procedure Laterality Date   ABDOMINAL HYSTERECTOMY     CARPAL TUNNEL RELEASE Right    CATARACT EXTRACTION Bilateral    HEMORROIDECTOMY     IVC Filter     TOTAL HIP ARTHROPLASTY Right    ULNAR NERVE TRANSPOSITION Right    Patient Active Problem List   Diagnosis Date Noted   Obesity (BMI 30-39.9) 01/28/2021   Acute CVA (cerebrovascular accident) ([place]) 01/25/2021   Acute kidney injury ([place]) 01/25/2021   CVA (cerebral vascular accident) ([place]) 01/06/2021   Essential hypertension 01/06/2021   Arthritis 01/06/2021   Pain due to onychomycosis of toenails of both feet 12/14/2018   Presbycusis of both ears 09/22/2015   Syncope 05/24/2013    REFERRING DIAG: I63.9 (ICD-10-CM) - CVA (cerebral vascular accident)    THERAPY DIAG:  Hemiplegia and hemiparesis  following cerebral infarction affecting left non-dominant side (HCC)  Muscle weakness (generalized)  Other lack of coordination  Unsteadiness on feet  PERTINENT HISTORY: 88 year old female with history of smoking, hypertension, cervical DDD, bilateral DVTs admitted on 01/05/2021 for bilateral hand weakness, numbness and the left leg weakness.    PRECAUTIONS: Fall   SUBJECTIVE: Daughter reports HEP is going well performed about every other day.    PAIN:  Are you having pain? No  VITALS: All BP readings taken in RUE while seated  -Pre-session: BP 138/63 mmHg, HR 61 bpm  TODAY'S TREATMENT:   Pt received in w/c in lobby at onset of session.    Time spent discussing re-cert process with daughter and answering questions about progress and current functional level as appropriate.  Initiated session with level sliding board transfer to right w/ pt requiring maxA to position LE during transition, modA for management of slide using functional right pull on board.  Pt able to manage and maintain trunk into forward lean independently.  She requires rest break prior to second transfer.  Upon transferring back to left into w/c pt requires maxA as she has difficulty maintaining forward lean and inc discomfort in right knee requiring PROM for comfort midway through transfer w/ pt able to maintain  seated balance on board.  She is unable to use functional grip of LUE but PT is able to stretch LUE to rest hand on knee as cue for pt to maintain forward lean with minimal effectiveness.  Worked on seated scooting backwards into w/c w/ PT providing maxA but pt demonstrating good initiation of LE engagement during onset of movement using multimodal cuing to maintain head hip relationship and anterior weight shift.  Pt demonstrates good initiation of scoot with RLE, but is unable to sustain push during multiple attempts.    PATIENT EDUCATION: Education details: Continue HEP.  Discussed visiting ortho MD about  cortisone shots for right knee comfort as daughter requested info about receiving shots again.  Deferred to MD. Person educated: Patient and Child(ren) Education method: Explanation and Demonstration Education comprehension: verbalized understanding and needs further education      HOME EXERCISE PROGRAM:  Access Code: [email or web address] URL: [email or web address] Date: 08/18/2021 Prepared by: Willow Ora  Exercises - Seated Heel Raise  - 1 x daily - 5 x weekly - 1 sets - 10 reps - seated hamstring stretch  - 1 x daily - 5 x weekly - 1 sets - 3 reps - 30 seconds hold - Supine Transversus Abdominis Bracing - Hands on Ground  - 1 x daily - 5 x weekly - 1 sets - 10 reps - Hip and Knee Extension and Flexion Caregiver PROM  - 1 x daily - 5 x weekly - 1 sets - 10 reps - Supine Knee Extension Strengthening  - 1 x daily - 5 x weekly - 1 sets - 10 reps - Supine Lower Trunk Rotation  - 1 x daily - 5 x weekly - 1 sets - 5-6 reps - Supine Hip Adduction Isometric with Ball  - 1 x daily - 5 x weekly - 1 sets - 10 reps - 3 seconds hold       GOALS: Goals reviewed with patient? Yes   SHORT TERM GOALS: Target date: 09/01/2021   Pt will be able to perform initial HEP for ROM and strengthening with caregiver assist. Baseline:  Established and compliant Goal status: MET   2.  Pt will be able to perform lateral transfer with slideboard max assist. Baseline: Hoyer lift; modA-maxA to right Goal status: MET   3.  Pt will be able to maintain sitting balance edge of mat x 2 min supervision for improved sitting balance and ADLs. Baseline: 09/01/2021 pt able to sit EOM supervision level x5 minutes Goal status: MET   4.  Pt will be able to perform rolling to the left mod I and to the right mod assist for improved mobility and decrease caregiver burden. Baseline: min assist to left and total assist to right. 09/01/2021 from prior notes min-modA to left, maxA to right Goal status: NOT MET       LONG  TERM GOALS: Target date:  10/01/21   Pt will be able to perform progressive HEP with caregiver assist for strengthening and ROM and balance. Baseline:  Goal status: INITIAL   2.  Pt will be able to perform slideboard transfer after board placed mod assist for improved mobility. Baseline:  Goal status: INITIAL   3.  Pt will be able to sit edge of mat >5 min with hands in lap/performing reaching for improved balance and ADLs supervision. Baseline:  Goal status: INITIAL   4.  Pt will be able to stand at sink x 2 min min assist for improved standing ability. Baseline:  Goal status: INITIAL   5.  Pt will be able to perform sit to/from supine max assist for improved mobility. Baseline:  Goal status: INITIAL       ASSESSMENT:   CLINICAL IMPRESSION: Discussed re-certification process this session with daughter due to patient's current functional progress with continued promotion of HEP.  This skilled session focused on continuing to address sliding board transfers with improved pt engagement to task remaining limited by BLE weakness and pain in right knee.  Will continue to address functional transfers from surface-to-surface as well as into standing as able to progress toward LTGs.     OBJECTIVE IMPAIRMENTS Abnormal gait, decreased balance, decreased knowledge of use of DME, decreased mobility, decreased ROM, decreased strength, dizziness, increased edema, impaired UE functional use, and pain.    ACTIVITY LIMITATIONS cleaning, community activity, driving, meal prep, laundry, medication management, yard work, and shopping.    PERSONAL FACTORS Age, Time since onset of injury/illness/exacerbation, and 3+ comorbidities: hypertension, cervical DDD, bilateral DVTs admitted on 01/05/2021 for bilateral hand weakness, numbness and the left leg weakness  are also affecting patient's functional outcome.      REHAB POTENTIAL: Fair     CLINICAL DECISION MAKING: Evolving/moderate complexity    EVALUATION COMPLEXITY: Moderate   PLAN: PT FREQUENCY: 2x/week   PT DURATION: 8 weeks   PLANNED INTERVENTIONS: Therapeutic exercises, Therapeutic activity, Neuromuscular re-education, Balance training, Gait training, Patient/Family education, Joint mobilization, Vestibular training, Canalith repositioning, Orthotic/Fit training, DME instructions, Wheelchair mobility training, Cryotherapy, Moist heat, and Manual therapy   PLAN FOR NEXT SESSION:  10th visit PN!  Stand w/ steadi.  Sliding board transfers.  Weight shifting, practice multi-directional scooting EOM.  Continue multidirectional reaching and further assess trunk rotation in sitting.    Bed mobility. Is Hoyer lift transfer at this time-progressing to sliding board (continue to assess/work on). Sitting EOM.  If 2 people practice transition supine <>sit.  Continue to work on [place] strengthening in sitting and supine.  Address IR/ER of hip in supine, further stretching-especially Left ankle, glut/quad sets, bridging, heel slides-use tapping at tendon insertion, trunk rotations in sitting    Elease Etienne, PT, DPT  09/20/21, 5:19 PM

## 2021-09-22 ENCOUNTER — Encounter: Payer: Self-pay | Admitting: Physical Therapy

## 2021-09-22 ENCOUNTER — Ambulatory Visit: Payer: Medicare Other | Admitting: Physical Therapy

## 2021-09-22 ENCOUNTER — Ambulatory Visit: Payer: Medicare Other | Admitting: Occupational Therapy

## 2021-09-22 DIAGNOSIS — R208 Other disturbances of skin sensation: Secondary | ICD-10-CM

## 2021-09-22 DIAGNOSIS — I69354 Hemiplegia and hemiparesis following cerebral infarction affecting left non-dominant side: Secondary | ICD-10-CM | POA: Diagnosis not present

## 2021-09-22 DIAGNOSIS — R41842 Visuospatial deficit: Secondary | ICD-10-CM

## 2021-09-22 DIAGNOSIS — R278 Other lack of coordination: Secondary | ICD-10-CM

## 2021-09-22 DIAGNOSIS — R2681 Unsteadiness on feet: Secondary | ICD-10-CM

## 2021-09-22 DIAGNOSIS — M6281 Muscle weakness (generalized): Secondary | ICD-10-CM

## 2021-09-22 DIAGNOSIS — R41840 Attention and concentration deficit: Secondary | ICD-10-CM

## 2021-09-22 NOTE — Therapy (Addendum)
OUTPATIENT PHYSICAL THERAPY TREATMENT NOTE/PROGRESS NOTE   Patient Name: [name] MRN: [id number] DOB:12/03/1928, 88 y.o., female Today's Date: 09/22/2021  PCP: Nolene Ebbs, MD  REFERRING PROVIDER: Nolene Ebbs, MD  PT progress note for [name].  Reporting period 08/03/2021 to 09/22/2021.  See Note below for Objective Data and Assessment of Progress/Goals  Thank you for the referral of this patient. Elease Etienne, PT, DPT  END OF SESSION:   PT End of Session - 09/22/21 1318     Visit Number 10    Number of Visits 17    Date for PT Re-Evaluation 10/01/21    Authorization Type [employer] medicare so 10th visit progress note    Progress Note Due on Visit 20    PT Start Time 1317    PT Stop Time 1400    PT Time Calculation (min) 43 min    Equipment Utilized During Treatment Other (comment);Gait belt   Stedy lift assist; sliding board   Activity Tolerance Patient limited by fatigue   Pt has difficulty staying awake, falls asleep during seated tasks.   Behavior During Therapy WFL for tasks assessed/performed                Past Medical History:  Diagnosis Date   Allergic rhinitis    Arthritis    Carpal tunnel syndrome    Deep venous thrombosis (HCC)    bilateral legs   Degenerative arthritis    right knee   Hypertension    Lumbar degenerative disc disease    Lump or mass in breast    Paresthesia    Syncope 05/24/2013   Vitamin D deficiency    Past Surgical History:  Procedure Laterality Date   ABDOMINAL HYSTERECTOMY     CARPAL TUNNEL RELEASE Right    CATARACT EXTRACTION Bilateral    HEMORROIDECTOMY     IVC Filter     TOTAL HIP ARTHROPLASTY Right    ULNAR NERVE TRANSPOSITION Right    Patient Active Problem List   Diagnosis Date Noted   Obesity (BMI 30-39.9) 01/28/2021   Acute CVA (cerebrovascular accident) ([place]) 01/25/2021   Acute kidney injury ([place]) 01/25/2021   CVA (cerebral vascular accident) ([place]) 01/06/2021   Essential hypertension  01/06/2021   Arthritis 01/06/2021   Pain due to onychomycosis of toenails of both feet 12/14/2018   Presbycusis of both ears 09/22/2015   Syncope 05/24/2013    REFERRING DIAG: I63.9 (ICD-10-CM) - CVA (cerebral vascular accident)    THERAPY DIAG:  Muscle weakness (generalized)  Unsteadiness on feet  PERTINENT HISTORY: 88 year old female with history of smoking, hypertension, cervical DDD, bilateral DVTs admitted on 01/05/2021 for bilateral hand weakness, numbness and the left leg weakness.    PRECAUTIONS: Fall   SUBJECTIVE: No new complaints. Very alert today. Denies any pain at this time.   PAIN:  Are you having pain? No  VITALS: All BP readings taken in RUE while seated  -Pre-session: BP 138/63 mmHg, HR 61 bpm  TODAY'S TREATMENT:  09/22/2021  Pt received seated at edge of mat after working with OT prior to PT session.     STRENGTHENING  Performed for following on bil LE's for 10 reps each in unsupported siting. Increased assistance needed with left>right LE with cues to maintain upright posture.  Heel raises- assist to clear floor with left Toe raises- assist to increase floor clearance with left Long arc quad with assist for full range on both sides Hamstring curls with yellow band resistance with assist to maintain  foot position with use of pillow case under foot to decreased friction Hip fall outs with yellow band resistance with PTA hand as target, increased range right>left Pillow squeeze 3 second holds with decreased movement noted with left side  TRANSFERS: Sit<>stands with use of Stedy lift assist: max assist of 2 from very elevated mat table (pt practically standing while seated) x 3 reps. Pt using right UE only to pull self up with cues/facilitation for anterior weight shifting and to power up through LE's. Pt's buttocks cleared mat table each time, however full standing not achieved even with cues/facilitation at pelvis/shoulders.   Slide board transfer from mat  table to wheelchair- PTA seated in chair with armrests in front of pt. Pt reaching forward to grab arm rest with right UE, cues to lean toward PTA right side (left for patient) and PTA assisting at pelvis for weight shifting/scooting along board with mod/max assist. Once in wheelchair had pt lean forward once again holding arm rest of chair and mod assist of 2 to fully scoot back in chair with PTA facilitating through femurs and Pt behind wheelchair assisting at pelvis.     PATIENT EDUCATION: Education details: Continue with current HEP, encouraged to be more compliant with HEP Person educated: Patient and Child(ren) Education method: Explanation and Demonstration Education comprehension: verbalized understanding and needs further education      HOME EXERCISE PROGRAM:  Access Code: [email or web address] URL: [email or web address] Date: 08/18/2021 Prepared by: Willow Ora  Exercises - Seated Heel Raise  - 1 x daily - 5 x weekly - 1 sets - 10 reps - seated hamstring stretch  - 1 x daily - 5 x weekly - 1 sets - 3 reps - 30 seconds hold - Supine Transversus Abdominis Bracing - Hands on Ground  - 1 x daily - 5 x weekly - 1 sets - 10 reps - Hip and Knee Extension and Flexion Caregiver PROM  - 1 x daily - 5 x weekly - 1 sets - 10 reps - Supine Knee Extension Strengthening  - 1 x daily - 5 x weekly - 1 sets - 10 reps - Supine Lower Trunk Rotation  - 1 x daily - 5 x weekly - 1 sets - 5-6 reps - Supine Hip Adduction Isometric with Ball  - 1 x daily - 5 x weekly - 1 sets - 10 reps - 3 seconds hold       GOALS: Goals reviewed with patient? Yes   SHORT TERM GOALS: Target date: 09/01/2021   Pt will be able to perform initial HEP for ROM and strengthening with caregiver assist. Baseline:  Established and compliant Goal status: MET   2.  Pt will be able to perform lateral transfer with slideboard max assist. Baseline: Hoyer lift; modA-maxA to right Goal status: MET   3.  Pt will be able to  maintain sitting balance edge of mat x 2 min supervision for improved sitting balance and ADLs. Baseline: 09/01/2021 pt able to sit EOM supervision level x5 minutes Goal status: MET   4.  Pt will be able to perform rolling to the left mod I and to the right mod assist for improved mobility and decrease caregiver burden. Baseline: min assist to left and total assist to right. 09/01/2021 from prior notes min-modA to left, maxA to right Goal status: NOT MET       LONG TERM GOALS: Target date:  10/01/21   Pt will be able to perform progressive HEP with caregiver assist for strengthening  and ROM and balance. Baseline:  Goal status: INITIAL   2.  Pt will be able to perform slideboard transfer after board placed mod assist for improved mobility. Baseline:  Goal status: INITIAL   3.  Pt will be able to sit edge of mat >5 min with hands in lap/performing reaching for improved balance and ADLs supervision. Baseline:  Goal status: INITIAL   4.  Pt will be able to stand at sink x 2 min min assist for improved standing ability. Baseline:  Goal status: INITIAL   5.  Pt will be able to perform sit to/from supine max assist for improved mobility. Baseline:  Goal status: INITIAL       ASSESSMENT:   CLINICAL IMPRESSION: Today's skilled session continued to address strengthening, sitting balance and transfers with Stedy/sliding board. Pt continues to be limited in standing/LE weight bearing with complaints of increased bil knee pain. Pt's daughter reports she is planning to call about pt getting shots in knees for pain control. Pt has progressed from solely hoyer transfers in clinic to sliding board with mod-maxA and improved trunk control maintained throughout transfer. The pt should benefit from continued PT to progress toward unmet goals.    OBJECTIVE IMPAIRMENTS Abnormal gait, decreased balance, decreased knowledge of use of DME, decreased mobility, decreased ROM, decreased strength, dizziness,  increased edema, impaired UE functional use, and pain.    ACTIVITY LIMITATIONS cleaning, community activity, driving, meal prep, laundry, medication management, yard work, and shopping.    PERSONAL FACTORS Age, Time since onset of injury/illness/exacerbation, and 3+ comorbidities: hypertension, cervical DDD, bilateral DVTs admitted on 01/05/2021 for bilateral hand weakness, numbness and the left leg weakness  are also affecting patient's functional outcome.      REHAB POTENTIAL: Fair     CLINICAL DECISION MAKING: Evolving/moderate complexity   EVALUATION COMPLEXITY: Moderate   PLAN: PT FREQUENCY: 2x/week   PT DURATION: 8 weeks   PLANNED INTERVENTIONS: Therapeutic exercises, Therapeutic activity, Neuromuscular re-education, Balance training, Gait training, Patient/Family education, Joint mobilization, Vestibular training, Canalith repositioning, Orthotic/Fit training, DME instructions, Wheelchair mobility training, Cryotherapy, Moist heat, and Manual therapy   PLAN FOR NEXT SESSION:  Stand w/ Stedy.  Sliding board transfers.  Weight shifting, practice multi-directional scooting EOM.  Continue multidirectional reaching and further assess trunk rotation in sitting.    Bed mobility. Is Hoyer lift transfer at this time-progressing to sliding board (continue to assess/work on). Sitting EOM.  If 2 people practice transition supine <>sit.  Continue to work on [place] strengthening in sitting and supine.  Address IR/ER of hip in supine, further stretching-especially Left ankle, glut/quad sets, bridging, heel slides-use tapping at tendon insertion, trunk rotations in sitting      Willow Ora, PTA, CLT Elease Etienne, PT, DPT Outpatient [facility] [address], [place], [place] [address] [phone] 09/22/21, 3:48 PM

## 2021-09-22 NOTE — Therapy (Signed)
OUTPATIENT OCCUPATIONAL THERAPY TREATMENT NOTE AND PROGRESS UPDATE   Patient Name: [name] MRN: [id number] DOB:[birth year], 88 y.o., female Today's Date: 09/22/2021  PCP: Nolene Ebbs, MD  REFERRING PROVIDER: Nolene Ebbs, MD   END OF SESSION:   OT End of Session - 09/22/21 1354     Visit Number 10    Number of Visits 17    Date for OT Re-Evaluation 10/02/21    Authorization Type UHC Medicare and Medicaid,Covered 100% as long as she's enrolled with Medicaid  VL: Follows Medicare guidelines    Progress Note Due on Visit 10    OT Start Time 27    OT Stop Time 1315    OT Time Calculation (min) 45 min    Equipment Utilized During Treatment sliding board, UE Ranger    Activity Tolerance Patient tolerated treatment well    Behavior During Therapy WFL for tasks assessed/performed              Past Medical History:  Diagnosis Date   Allergic rhinitis    Arthritis    Carpal tunnel syndrome    Deep venous thrombosis (HCC)    bilateral legs   Degenerative arthritis    right knee   Hypertension    Lumbar degenerative disc disease    Lump or mass in breast    Paresthesia    Syncope 05/24/2013   Vitamin D deficiency    Past Surgical History:  Procedure Laterality Date   ABDOMINAL HYSTERECTOMY     CARPAL TUNNEL RELEASE Right    CATARACT EXTRACTION Bilateral    HEMORROIDECTOMY     IVC Filter     TOTAL HIP ARTHROPLASTY Right    ULNAR NERVE TRANSPOSITION Right    Patient Active Problem List   Diagnosis Date Noted   Obesity (BMI 30-39.9) 01/28/2021   Acute CVA (cerebrovascular accident) ([place]) 01/25/2021   Acute kidney injury ([place]) 01/25/2021   CVA (cerebral vascular accident) ([place]) 01/06/2021   Essential hypertension 01/06/2021   Arthritis 01/06/2021   Pain due to onychomycosis of toenails of both feet 12/14/2018   Presbycusis of both ears 09/22/2015   Syncope 05/24/2013    ONSET DATE: 07/13/21 Referral date   REFERRING DIAG: Hemiplegia and hemiparesis  following cerebral infarction affecting left non-dominant side ([place])   THERAPY DIAG:  No diagnosis found.   PERTINENT HISTORY: vertigo, smoking, hypertension, cervical DDD, bilateral DVTs  multiple strokes (09/22, 10/22) .  right MCA territory scattered infarcts.  MRI C-spine showed C3/4 severe spinal stenosis and mild spinal cord compression.  EF 60 to 65%   PRECAUTIONS: fall  SUBJECTIVE: Daughter reports she needs patient sitting up more at home, but doesn't have enough help at home to get her up and spends most of the day in bed  PAIN:  Are you having pain? Yes NPRS scale: 0/10 at rest, fluctuates w/ movement Pain location: knees Pain orientation: Bilateral  PAIN TYPE: aching Pain description: intermittent  Aggravating factors: passive movement  Relieving factors: rest, injections      OBJECTIVE:   TODAY'S TREATMENT:  TODAY'S TREATMENT:   09/21/21:   Patient very alert this session.  Reports liking her custom resting hand splint, and wearing it at night.  Fingers are more extended this session.   Transferred level surface toward left with max assist with emphasis on weight shift forward and weight bearing thru BLE.  Worked on dynamic sitting balance - focusing on shifting weight forward and to left.   Neuromuscular reeducation to address LUE  active movement on mobile surface.  Patient with report of discomfort with end range of elbow extension.  Will continue to monitor.  Worked on gentle arm on body exercise with UE's supported on elevated table.    HOME EXERCISE PROGRAM: 08/18/21:  Table slides BUE  09/08/21:  ADL - Donning/doffing pull over shirt   09/14/21: splint wear and care     GOALS: Goals reviewed with patient? Yes   SHORT TERM GOALS: Target date: 08/31/2021   Patient and caregiver will complete an HEP designed to improve active range of motion in LUE Baseline: Stretching program Goal status: Achieved   2.  Patient will demonstrate 30% composite flexion  in digits Baseline: 20% then painful Goal status: Achieved   3.  Patient will sit at edge of mat table with intermittent min assistance x 30 seconds in preparation for active participation in ADL Baseline: 3-5 sec  Goal status: Achieved   4.  Patient will lean forward while seated at edge of mat as if reaching toward shoes/socks - reach past knees 2/3 down length of calf Baseline: just below knee Goal status: Achieved   5.  Patient will wash her face, chest and left arm after set up assistance Baseline: Dependent Goal status: Partially met - washing face only 09/01/21     LONG TERM GOALS: Target date: 09/28/2021   Patient will complete home activity program designed to improve functional use of LUE in simple self care tasks Baseline: No HAP Goal status: Ongoing   2.  Patient will report pain no greater than 3/10 in LEFT shoulder with active reach Baseline: 6/10 Goal status: Ongoing   3.  Patient will complete an active level surface transfer with max assist Baseline: dependent- hoyer Goal status: Ongoing   4.  Patient will assist with pulling up and down lower body clothing to reduce caregiver burden for dressing Baseline: Dependent Goal status: Ongoing   5.  Patient will demonstrate at least 50% composite flexion in left hand to aide with grasp/release Baseline: 20% flex Goal status: Ongoing       ASSESSMENT:   CLINICAL IMPRESSION:   This progress report covers dates of service between 08/03/21-09/22/21.  Patient has met most of her short term goals with the exception of washing her upper body.  Patient and daughter report that she is participating in more self care activities and exercise since before the start of therapy.    PERFORMANCE DEFICITS in functional skills including ADLs, IADLs, coordination, dexterity, proprioception, sensation, edema, tone, ROM, strength, pain, fascial restrictions, muscle spasms, flexibility, FMC, GMC, mobility, balance, body mechanics,  endurance, cardiopulmonary status limiting function, decreased knowledge of use of DME, UE functional use, and vestibular, cognitive skills including attention, energy/drive, memory, orientation, perception, problem solving, safety awareness, and sequencing, and psychosocial skills including environmental adaptation and habits.    IMPAIRMENTS are limiting patient from ADLs, IADLs, rest and sleep, leisure, and social participation.    COMORBIDITIES may have co-morbidities  that affects occupational performance. Patient will benefit from skilled OT to address above impairments and improve overall function.   MODIFICATION OR ASSISTANCE TO COMPLETE EVALUATION: Min-Moderate modification of tasks or assist with assess necessary to complete an evaluation.   OT OCCUPATIONAL PROFILE AND HISTORY: Detailed assessment: Review of records and additional review of physical, cognitive, psychosocial history related to current functional performance.   CLINICAL DECISION MAKING: Moderate - several treatment options, min-mod task modification necessary   REHAB POTENTIAL: Good   EVALUATION COMPLEXITY: Moderate  PLAN: OT FREQUENCY: 2x/week   OT DURATION: 8 weeks   PLANNED INTERVENTIONS: self care/ADL training, therapeutic exercise, therapeutic activity, neuromuscular re-education, manual therapy, passive range of motion, balance training, functional mobility training, splinting, electrical stimulation, ultrasound, paraffin, fluidotherapy, moist heat, cryotherapy, patient/family education, cognitive remediation/compensation, visual/perceptual remediation/compensation, energy conservation, coping strategies training, and DME and/or AE instructions   RECOMMENDED OTHER SERVICES: Receiving PT   CONSULTED AND AGREED WITH PLAN OF CARE: Patient and family member/caregiver   PLAN FOR NEXT SESSION:  10th progress note, slide board transfer, scooting, sit to stand in [place]?, Upper body dressing      Mariah Milling, OT 09/22/2021, 1:57 PM

## 2021-09-27 ENCOUNTER — Encounter: Payer: Self-pay | Admitting: Occupational Therapy

## 2021-09-27 ENCOUNTER — Ambulatory Visit: Payer: Medicare Other | Admitting: Occupational Therapy

## 2021-09-27 ENCOUNTER — Ambulatory Visit: Payer: Medicare Other | Admitting: Physical Therapy

## 2021-09-27 ENCOUNTER — Encounter: Payer: Self-pay | Admitting: Physical Therapy

## 2021-09-27 DIAGNOSIS — I69354 Hemiplegia and hemiparesis following cerebral infarction affecting left non-dominant side: Secondary | ICD-10-CM

## 2021-09-27 DIAGNOSIS — R2689 Other abnormalities of gait and mobility: Secondary | ICD-10-CM

## 2021-09-27 DIAGNOSIS — R41842 Visuospatial deficit: Secondary | ICD-10-CM

## 2021-09-27 DIAGNOSIS — R41840 Attention and concentration deficit: Secondary | ICD-10-CM

## 2021-09-27 DIAGNOSIS — M6281 Muscle weakness (generalized): Secondary | ICD-10-CM

## 2021-09-27 DIAGNOSIS — R2681 Unsteadiness on feet: Secondary | ICD-10-CM

## 2021-09-27 DIAGNOSIS — R208 Other disturbances of skin sensation: Secondary | ICD-10-CM

## 2021-09-27 DIAGNOSIS — R278 Other lack of coordination: Secondary | ICD-10-CM

## 2021-09-27 NOTE — Therapy (Signed)
OUTPATIENT PHYSICAL THERAPY TREATMENT NOTE/RE-CERT   Patient Name: [name] MRN: [id number] DOB:June 11, 1928, 88 y.o., female Today's Date: 09/27/2021  PCP: Nolene Ebbs, MD  REFERRING PROVIDER: Nolene Ebbs, MD  END OF SESSION:    09/27/21 1450  PT Visits / Re-Eval  Visit Number 11  Number of Visits 82 (17+12)  Date for PT Re-Evaluation 10/01/21  Authorization  Authorization Type [employer] medicare so 10th visit progress note  Progress Note Due on Visit 20  PT Time Calculation  PT Start Time 1448  PT Stop Time 1530  PT Time Calculation (min) 42 min  PT - End of Session  Equipment Utilized During Treatment Other (comment);Gait belt (Stedy lift assist; sliding board)  Activity Tolerance Patient limited by fatigue (Pt has difficulty staying awake, falls asleep during seated tasks.)  Behavior During Therapy WFL for tasks assessed/performed    Past Medical History:  Diagnosis Date   Allergic rhinitis    Arthritis    Carpal tunnel syndrome    Deep venous thrombosis (HCC)    bilateral legs   Degenerative arthritis    right knee   Hypertension    Lumbar degenerative disc disease    Lump or mass in breast    Paresthesia    Syncope 05/24/2013   Vitamin D deficiency    Past Surgical History:  Procedure Laterality Date   ABDOMINAL HYSTERECTOMY     CARPAL TUNNEL RELEASE Right    CATARACT EXTRACTION Bilateral    HEMORROIDECTOMY     IVC Filter     TOTAL HIP ARTHROPLASTY Right    ULNAR NERVE TRANSPOSITION Right    Patient Active Problem List   Diagnosis Date Noted   Obesity (BMI 30-39.9) 01/28/2021   Acute CVA (cerebrovascular accident) ([place]) 01/25/2021   Acute kidney injury ([place]) 01/25/2021   CVA (cerebral vascular accident) ([place]) 01/06/2021   Essential hypertension 01/06/2021   Arthritis 01/06/2021   Pain due to onychomycosis of toenails of both feet 12/14/2018   Presbycusis of both ears 09/22/2015   Syncope 05/24/2013    REFERRING DIAG: I63.9 (ICD-10-CM)  - CVA (cerebral vascular accident)    THERAPY DIAG:  Muscle weakness (generalized) - Plan: PT plan of care cert/re-cert  Unsteadiness on feet - Plan: PT plan of care cert/re-cert  Hemiplegia and hemiparesis following cerebral infarction affecting left non-dominant side ([place]) - Plan: PT plan of care cert/re-cert  Other abnormalities of gait and mobility - Plan: PT plan of care cert/re-cert  PERTINENT HISTORY: 88 year old female with history of smoking, hypertension, cervical DDD, bilateral DVTs admitted on 01/05/2021 for bilateral hand weakness, numbness and the left leg weakness.    PRECAUTIONS: Fall   SUBJECTIVE: Pt complains of right knee pain today.  Discussed upcoming PCP appt on 7/20 and calling ortho MD about cortisone shots due to change in weight-bearing status and difficulties w/ transfers because of knee pain.  PAIN:  Are you having pain? Yes: NPRS scale: 7/10 Pain location: right knee Pain description: constant; achy Aggravating factors: moving; sleep position Relieving factors: heat, ice  VITALS: All BP readings taken in RUE while seated  -Pre-session: BP 136/54 mmHg  TODAY'S TREATMENT: Pt received seated at edge of mat after working with OT prior to PT session.     THERACT: Reviewed HEP verbally (exercises below reviewed physically): -seated heel raises x5 (cues for LLE engagement) -Seated hamstring stretch w/ PT assist x30 sec each LE  Pt in sitting EOM w/o posterior support hands in lap x15mn progressed to reaching for midline cone  and bilaterally to 2 cones at varying heights.  Progressed to forward and backward rocking w/ cuing for pt to engage head and shoulders.  Maintained activity for 5 mins and 20 sec.  Pt performs sit EOM <> supine requiring maxA of 1 for roll and LE management, pt demonstrates good engagement to push from side-lying using right UE across body, when going into supine pt able to perform left lean onto elbow independently with inc time to  perform task.  Intermittent positioning of LUE required for protection of shoulder.  Pt performs sliding board transfer to left from mat to w/c requiring maxA of 1 to position LE and perform boost on board, pt demonstrates dec UE push, pt fatigued from prior OT session.  PATIENT EDUCATION: Education details: Continue with current HEP, reviewed modifications for caregiver ease with emphasis on transition of care to home health PT if no caregiver to assist with HEP to promote carryover in home environment. Person educated: Patient and Child(ren) Education method: Explanation and Demonstration Education comprehension: verbalized understanding and needs further education      HOME EXERCISE PROGRAM:  Access Code: [email or web address] URL: [email or web address] Date: 08/18/2021 Prepared by: Willow Ora  Exercises - Seated Heel Raise  - 1 x daily - 5 x weekly - 1 sets - 10 reps - seated hamstring stretch  - 1 x daily - 5 x weekly - 1 sets - 3 reps - 30 seconds hold - Supine Transversus Abdominis Bracing - Hands on Ground  - 1 x daily - 5 x weekly - 1 sets - 10 reps - Hip and Knee Extension and Flexion Caregiver PROM  - 1 x daily - 5 x weekly - 1 sets - 10 reps - Supine Knee Extension Strengthening  - 1 x daily - 5 x weekly - 1 sets - 10 reps - Supine Lower Trunk Rotation  - 1 x daily - 5 x weekly - 1 sets - 5-6 reps - Supine Hip Adduction Isometric with Ball  - 1 x daily - 5 x weekly - 1 sets - 10 reps - 3 seconds hold       OLD GOALS: Goals reviewed with patient? Yes   SHORT TERM GOALS: Target date: 09/01/2021   Pt will be able to perform initial HEP for ROM and strengthening with caregiver assist. Baseline:  Established and compliant Goal status: MET   2.  Pt will be able to perform lateral transfer with slideboard max assist. Baseline: Hoyer lift; modA-maxA to right Goal status: MET   3.  Pt will be able to maintain sitting balance edge of mat x 2 min supervision for improved  sitting balance and ADLs. Baseline: 09/01/2021 pt able to sit EOM supervision level x5 minutes Goal status: MET   4.  Pt will be able to perform rolling to the left mod I and to the right mod assist for improved mobility and decrease caregiver burden. Baseline: min assist to left and total assist to right. 09/01/2021 from prior notes min-modA to left, maxA to right Goal status: NOT MET       LONG TERM GOALS: Target date:  10/01/21   Pt will be able to perform progressive HEP with caregiver assist for strengthening and ROM and balance. Baseline:  Daughter states CNA is assisting with HEP.  Needs advancements Goal status: MET   2.  Pt will be able to perform slideboard transfer after board placed mod assist for improved mobility. Baseline: MaxA to left Goal status: NOT MET  3.  Pt will be able to sit edge of mat >5 min with hands in lap/performing reaching for improved balance and ADLs supervision. Baseline: EOM no posterior support performing reaching and static sitting >5 mins Goal status: MET   4.  Pt will be able to stand at sink x 2 min min assist for improved standing ability. Baseline: Pt not standing upright fully at this time. Goal status: NOT MET   5.  Pt will be able to perform sit to/from supine max assist for improved mobility. Baseline: maxA Goal status: MET  UPDATED GOALS: SHORT TERM GOALS:   Target date: 10/29/2021  Pt will be compliant to HEP with caregiver assist for stretching and strengthening x4 days a week. Baseline: Intermittent compliance as CNA available, daughter working on more consistent help. Goal status: INITIAL  2.  Pt will be able to perform slideboard transfer after board placed mod assist for improved mobility. Baseline: MaxA Goal status: INITIAL  3.  Pt will stand in [place] w/ maxA of 1 in order to work on standing tolerance and BLE weight bearing. Baseline: MaxA of 2 w/ Stedy Goal status: INITIAL  4.  Pt will perform sit<>supine modA to  improve functional mobility at home. Baseline: MaxA Goal status: INITIAL  LONG TERM GOALS:  Target date: 11/26/2021  Pt will report decreased pain in right knee during functional transfers with sliding board in order to improve LE engagement to transfers and functional mobility. Baseline: 7/10 constant ache Goal status: INITIAL  2.  Pt will perform reach to floor from sitting and return to upright sitting supervision level to demonstrate improved functional balance for ADL performance. Baseline: Can reach forward and bilaterally outside BOS ~5 inches. Goal status: INITIAL  3.  Pt will tolerate standing in Stedy w/o using seat x2 mins to improve weight bearing tolerance. Baseline: MaxA of 2 w/ Stedy, relies on seat Goal status: INITIAL  4.  Pt will perform sliding board transfer after board placed minA in order to improve safety and functional independence with transfers with decreased caregiver assist. Baseline: MaxA Goal status: INITIAL  ASSESSMENT:   CLINICAL IMPRESSION: Today's session focused on assessment of long-term goals and re-certification of POC to focus on realistic progress with patient's sitting and standing balance.  Will continue to address sliding board transfers to see if this is a viable and safe option to promote functional independence and decreased caregiver burden.  During visit patient requires max assist with sliding board transfer to the left due to fatigue, but has in prior sessions performed at mod-maxA levels.  She continues to be limited by right knee pain which daughter is pursuing ortho care and possible cortisone shots to improve in the near future.  At this time she requires maxA of 2 to stand with Stedy.  Daughter states compliance to current HEP with CNA as able.  Her sitting tolerance has drastically improved with pt being able to sit unsupported and reach outside BOS >5 minutes.  Goals have been updated to reflect current functional level and will  continue to progress towards new goals per updated POC.   OBJECTIVE IMPAIRMENTS Abnormal gait, decreased balance, decreased knowledge of use of DME, decreased mobility, decreased ROM, decreased strength, dizziness, increased edema, impaired UE functional use, and pain.    ACTIVITY LIMITATIONS cleaning, community activity, driving, meal prep, laundry, medication management, yard work, and shopping.    PERSONAL FACTORS Age, Time since onset of injury/illness/exacerbation, and 3+ comorbidities: hypertension, cervical DDD, bilateral DVTs admitted on  01/05/2021 for bilateral hand weakness, numbness and the left leg weakness  are also affecting patient's functional outcome.      REHAB POTENTIAL: Fair     CLINICAL DECISION MAKING: Evolving/moderate complexity   EVALUATION COMPLEXITY: Moderate   PLAN: PT FREQUENCY: 2x/week for 4 weeks followed by 1x/week for 4 weeks   PT DURATION: 8 weeks   PLANNED INTERVENTIONS: Therapeutic exercises, Therapeutic activity, Neuromuscular re-education, Balance training, Gait training, Patient/Family education, Joint mobilization, Vestibular training, Canalith repositioning, Orthotic/Fit training, DME instructions, Wheelchair mobility training, Cryotherapy, Moist heat, and Manual therapy   PLAN FOR NEXT SESSION:  Stand w/ Stedy.  Sliding board transfers.  Weight shifting, practice multi-directional scooting EOM.  Continue multidirectional reaching and further assess trunk rotation in sitting.    Bed mobility. Is Hoyer lift transfer at this time-progressing to sliding board (continue to assess/work on). Sitting EOM.  If 2 people practice transition supine <>sit.  Continue to work on [place] strengthening in sitting and supine.  Address IR/ER of hip in supine, further stretching-especially Left ankle, glut/quad sets, bridging, heel slides-use tapping at tendon insertion, trunk rotations in sitting     Elease Etienne, PT, DPT [facility]patient [facility] [address] [place], [place] [address] [phone] 09/27/21, 4:39 PM

## 2021-09-27 NOTE — Therapy (Signed)
OUTPATIENT OCCUPATIONAL THERAPY TREATMENT NOTE   Patient Name: WESLEE PRESTAGE MRN: [id number] DOB:1928/09/07, 88 y.o., female Today's Date: 09/27/2021  PCP: Nolene Ebbs, MD  REFERRING PROVIDER: Nolene Ebbs, MD   END OF SESSION:   OT End of Session - 09/27/21 1405     Visit Number 11    Number of Visits 17    Date for OT Re-Evaluation 10/02/21    Authorization Type UHC Medicare and Medicaid,Covered 100% as long as she's enrolled with Medicaid  VL: Follows Medicare guidelines    Progress Note Due on Visit 7    OT Start Time 70    OT Stop Time 1445    OT Time Calculation (min) 43 min    Equipment Utilized During Treatment sliding board, UE Ranger    Activity Tolerance Patient tolerated treatment well    Behavior During Therapy WFL for tasks assessed/performed              Past Medical History:  Diagnosis Date   Allergic rhinitis    Arthritis    Carpal tunnel syndrome    Deep venous thrombosis (HCC)    bilateral legs   Degenerative arthritis    right knee   Hypertension    Lumbar degenerative disc disease    Lump or mass in breast    Paresthesia    Syncope 05/24/2013   Vitamin D deficiency    Past Surgical History:  Procedure Laterality Date   ABDOMINAL HYSTERECTOMY     CARPAL TUNNEL RELEASE Right    CATARACT EXTRACTION Bilateral    HEMORROIDECTOMY     IVC Filter     TOTAL HIP ARTHROPLASTY Right    ULNAR NERVE TRANSPOSITION Right    Patient Active Problem List   Diagnosis Date Noted   Obesity (BMI 30-39.9) 01/28/2021   Acute CVA (cerebrovascular accident) ([place]) 01/25/2021   Acute kidney injury ([place]) 01/25/2021   CVA (cerebral vascular accident) ([place]) 01/06/2021   Essential hypertension 01/06/2021   Arthritis 01/06/2021   Pain due to onychomycosis of toenails of both feet 12/14/2018   Presbycusis of both ears 09/22/2015   Syncope 05/24/2013    ONSET DATE: 07/13/21 Referral date   REFERRING DIAG: Hemiplegia and hemiparesis following cerebral  infarction affecting left non-dominant side ([place])   THERAPY DIAG:  Unsteadiness on feet  Muscle weakness (generalized)  Hemiplegia and hemiparesis following cerebral infarction affecting left non-dominant side (HCC)  Other lack of coordination  Other disturbances of skin sensation  Attention and concentration deficit  Visuospatial deficit   PERTINENT HISTORY: vertigo, smoking, hypertension, cervical DDD, bilateral DVTs  multiple strokes (09/22, 10/22) .  right MCA territory scattered infarcts.  MRI C-spine showed C3/4 severe spinal stenosis and mild spinal cord compression.  EF 60 to 65%   PRECAUTIONS: fall  SUBJECTIVE: daughter reports contacting MD reporting needing another injection in both knees for pain relief.  PAIN:  Are you having pain? Yes NPRS scale: 0/10 at rest, fluctuates w/ movement Pain location: knees Pain orientation: Bilateral  PAIN TYPE: aching Pain description: intermittent  Aggravating factors: passive movement  Relieving factors: rest, injections      OBJECTIVE:   TODAY'S TREATMENT:  TODAY'S TREATMENT:  09/27/21 Pt met in waiting room/lobby for start of session. Transfer from w/c to mat with Max-Total A to right with sliding board. Pt with increased posterior and lateral lean to right sitting edge of mat. Pt req'd max A for hip and trunk in midline. Pt worked on leaning forward with use of hemi  glide with LUE with max A for maintaining hand position and forward reaching with physioball in front.     HOME EXERCISE PROGRAM: 08/18/21:  Table slides BUE  09/08/21:  ADL - Donning/doffing pull over shirt   09/14/21: splint wear and care     GOALS: Goals reviewed with patient? Yes   SHORT TERM GOALS: Target date: 08/31/2021   Patient and caregiver will complete an HEP designed to improve active range of motion in LUE Baseline: Stretching program Goal status: Achieved   2.  Patient will demonstrate 30% composite flexion in digits Baseline: 20%  then painful Goal status: Achieved   3.  Patient will sit at edge of mat table with intermittent min assistance x 30 seconds in preparation for active participation in ADL Baseline: 3-5 sec  Goal status: Achieved   4.  Patient will lean forward while seated at edge of mat as if reaching toward shoes/socks - reach past knees 2/3 down length of calf Baseline: just below knee Goal status: Achieved   5.  Patient will wash her face, chest and left arm after set up assistance Baseline: Dependent Goal status: Partially met - washing face only 09/01/21     LONG TERM GOALS: Target date: 09/28/2021   Patient will complete home activity program designed to improve functional use of LUE in simple self care tasks Baseline: No HAP Goal status: Ongoing   2.  Patient will report pain no greater than 3/10 in LEFT shoulder with active reach Baseline: 6/10 Goal status: Ongoing   3.  Patient will complete an active level surface transfer with max assist Baseline: dependent- hoyer Goal status: Ongoing   4.  Patient will assist with pulling up and down lower body clothing to reduce caregiver burden for dressing Baseline: Dependent Goal status: Ongoing   5.  Patient will demonstrate at least 50% composite flexion in left hand to aide with grasp/release Baseline: 20% flex Goal status: Ongoing       ASSESSMENT:   CLINICAL IMPRESSION:   Pt continues to progress towards goals however demonstrated increased posterior land right lateral lean while seated edge of mat.   PERFORMANCE DEFICITS in functional skills including ADLs, IADLs, coordination, dexterity, proprioception, sensation, edema, tone, ROM, strength, pain, fascial restrictions, muscle spasms, flexibility, FMC, GMC, mobility, balance, body mechanics, endurance, cardiopulmonary status limiting function, decreased knowledge of use of DME, UE functional use, and vestibular, cognitive skills including attention, energy/drive, memory, orientation,  perception, problem solving, safety awareness, and sequencing, and psychosocial skills including environmental adaptation and habits.    IMPAIRMENTS are limiting patient from ADLs, IADLs, rest and sleep, leisure, and social participation.    COMORBIDITIES may have co-morbidities  that affects occupational performance. Patient will benefit from skilled OT to address above impairments and improve overall function.   MODIFICATION OR ASSISTANCE TO COMPLETE EVALUATION: Min-Moderate modification of tasks or assist with assess necessary to complete an evaluation.   OT OCCUPATIONAL PROFILE AND HISTORY: Detailed assessment: Review of records and additional review of physical, cognitive, psychosocial history related to current functional performance.   CLINICAL DECISION MAKING: Moderate - several treatment options, min-mod task modification necessary   REHAB POTENTIAL: Good   EVALUATION COMPLEXITY: Moderate     PLAN: OT FREQUENCY: 2x/week   OT DURATION: 8 weeks   PLANNED INTERVENTIONS: self care/ADL training, therapeutic exercise, therapeutic activity, neuromuscular re-education, manual therapy, passive range of motion, balance training, functional mobility training, splinting, electrical stimulation, ultrasound, paraffin, fluidotherapy, moist heat, cryotherapy, patient/family education, cognitive remediation/compensation, visual/perceptual  remediation/compensation, energy conservation, coping strategies training, and DME and/or AE instructions   RECOMMENDED OTHER SERVICES: Receiving PT   CONSULTED AND AGREED WITH PLAN OF CARE: Patient and family member/caregiver   PLAN FOR NEXT SESSION:  slide board transfer, scooting, sit to stand in [place]?, Upper body dressing      Zachery Conch, [place] 09/27/2021, 2:06 PM

## 2021-09-29 ENCOUNTER — Ambulatory Visit: Payer: Medicare Other | Admitting: Physical Therapy

## 2021-09-29 ENCOUNTER — Ambulatory Visit: Payer: Medicare Other | Admitting: Occupational Therapy

## 2021-09-29 ENCOUNTER — Encounter: Payer: Self-pay | Admitting: Physical Therapy

## 2021-09-29 ENCOUNTER — Encounter: Payer: Self-pay | Admitting: Occupational Therapy

## 2021-09-29 VITALS — BP 154/77

## 2021-09-29 DIAGNOSIS — I69354 Hemiplegia and hemiparesis following cerebral infarction affecting left non-dominant side: Secondary | ICD-10-CM

## 2021-09-29 DIAGNOSIS — M6281 Muscle weakness (generalized): Secondary | ICD-10-CM

## 2021-09-29 DIAGNOSIS — R2681 Unsteadiness on feet: Secondary | ICD-10-CM

## 2021-09-29 DIAGNOSIS — R41840 Attention and concentration deficit: Secondary | ICD-10-CM

## 2021-09-29 DIAGNOSIS — R208 Other disturbances of skin sensation: Secondary | ICD-10-CM

## 2021-09-29 DIAGNOSIS — R41842 Visuospatial deficit: Secondary | ICD-10-CM

## 2021-09-29 DIAGNOSIS — R278 Other lack of coordination: Secondary | ICD-10-CM

## 2021-09-29 NOTE — Therapy (Signed)
OUTPATIENT PHYSICAL THERAPY TREATMENT NOTE/RE-CERT   Patient Name: [name] MRN: [id number] DOB:02-16-1929, 88 y.o., female Today's Date: 09/29/2021  PCP: Nolene Ebbs, MD  REFERRING PROVIDER: Nolene Ebbs, MD  END OF SESSION:   PT End of Session - 09/29/21 1320     Visit Number 12    Number of Visits 29    Date for PT Re-Evaluation 11/26/21    Authorization Type UHC medicare so 10th visit progress note    Progress Note Due on Visit 20    PT Start Time 1319    PT Stop Time 1400    PT Time Calculation (min) 41 min    Equipment Utilized During Treatment Other (comment);Gait belt   sliding board   Activity Tolerance Patient tolerated treatment well    Behavior During Therapy WFL for tasks assessed/performed                Past Medical History:  Diagnosis Date   Allergic rhinitis    Arthritis    Carpal tunnel syndrome    Deep venous thrombosis (HCC)    bilateral legs   Degenerative arthritis    right knee   Hypertension    Lumbar degenerative disc disease    Lump or mass in breast    Paresthesia    Syncope 05/24/2013   Vitamin D deficiency    Past Surgical History:  Procedure Laterality Date   ABDOMINAL HYSTERECTOMY     CARPAL TUNNEL RELEASE Right    CATARACT EXTRACTION Bilateral    HEMORROIDECTOMY     IVC Filter     TOTAL HIP ARTHROPLASTY Right    ULNAR NERVE TRANSPOSITION Right    Patient Active Problem List   Diagnosis Date Noted   Obesity (BMI 30-39.9) 01/28/2021   Acute CVA (cerebrovascular accident) ([place]) 01/25/2021   Acute kidney injury ([place]) 01/25/2021   CVA (cerebral vascular accident) ([place]) 01/06/2021   Essential hypertension 01/06/2021   Arthritis 01/06/2021   Pain due to onychomycosis of toenails of both feet 12/14/2018   Presbycusis of both ears 09/22/2015   Syncope 05/24/2013    REFERRING DIAG: I63.9 (ICD-10-CM) - CVA (cerebral vascular accident)    THERAPY DIAG:  Unsteadiness on feet  Muscle weakness  (generalized)  Hemiplegia and hemiparesis following cerebral infarction affecting left non-dominant side (HCC)  PERTINENT HISTORY: 88 year old female with history of smoking, hypertension, cervical DDD, bilateral DVTs admitted on 01/05/2021 for bilateral hand weakness, numbness and the left leg weakness.    PRECAUTIONS: Fall   SUBJECTIVE: Pt complains of right knee pain today.   PAIN:  Are you having pain? Yes: NPRS scale: 7/10 Pain location: right knee Pain description: constant; achy Aggravating factors: moving; sleep position Relieving factors: heat, ice, Voltaren gel  VITALS: All BP readings taken in RUE while seated  Pre-session: BP 130/73, HR 70  TODAY'S TREATMENT: Pt received seated at edge of mat after working with OT prior to PT session.     BED MOBILITY:  Sitting>left side lying max assist with cues/facilitation on technique  Rolling onto back mod assist with cues  Max assist with cues/facilitation to roll from supine>left side lying  Max assist from left side lying>sitting EOM   TRANSFERS: Slide board transfer mat to wheelchair: pt able to weight shift with cues/assistance toward right side with total assist to place side board. Once board was placed, max assist for anterior weight shifting to slide across board from mat table to wheelchair. Once in wheelchair pt able to self shift weight  STRENGTHENING   Hook lying on mat with LE's over bolster:  - posterior pelvic tilt with 3 second holds x 10 reps - short arc quads with active assist right>left LE for 2 sets of 10 reps. Limited range of motion due to knee pain.      PATIENT EDUCATION: Education details: continue with current HEP with caregivers Person educated: Patient and Child(ren) Education method: Explanation and Demonstration Education comprehension: verbalized understanding and needs further education      HOME EXERCISE PROGRAM:  Access Code: [email or web address] URL: [email or web address] Date:  08/18/2021 Prepared by: Willow Ora  Exercises - Seated Heel Raise  - 1 x daily - 5 x weekly - 1 sets - 10 reps - seated hamstring stretch  - 1 x daily - 5 x weekly - 1 sets - 3 reps - 30 seconds hold - Supine Transversus Abdominis Bracing - Hands on Ground  - 1 x daily - 5 x weekly - 1 sets - 10 reps - Hip and Knee Extension and Flexion Caregiver PROM  - 1 x daily - 5 x weekly - 1 sets - 10 reps - Supine Knee Extension Strengthening  - 1 x daily - 5 x weekly - 1 sets - 10 reps - Supine Lower Trunk Rotation  - 1 x daily - 5 x weekly - 1 sets - 5-6 reps - Supine Hip Adduction Isometric with Ball  - 1 x daily - 5 x weekly - 1 sets - 10 reps - 3 seconds hold      GOALS  Goals reviewed with patient? Yes   SHORT TERM GOALS:   Target date: 10/29/2021  Pt will be compliant to HEP with caregiver assist for stretching and strengthening x4 days a week. Baseline: Intermittent compliance as CNA available, daughter working on more consistent help. Goal status: INITIAL  2.  Pt will be able to perform slideboard transfer after board placed mod assist for improved mobility. Baseline: MaxA Goal status: INITIAL  3.  Pt will stand in [place] w/ maxA of 1 in order to work on standing tolerance and BLE weight bearing. Baseline: MaxA of 2 w/ [place] Goal status: INITIAL  4.  Pt will perform sit<>supine modA to improve functional mobility at home. Baseline: MaxA Goal status: INITIAL  LONG TERM GOALS:  Target date: 11/26/2021  Pt will report decreased pain in right knee during functional transfers with sliding board in order to improve LE engagement to transfers and functional mobility. Baseline: 7/10 constant ache Goal status: INITIAL  2.  Pt will perform reach to floor from sitting and return to upright sitting supervision level to demonstrate improved functional balance for ADL performance. Baseline: Can reach forward and bilaterally outside BOS ~5 inches. Goal status: INITIAL  3.  Pt will tolerate  standing in [place] w/o using seat x2 mins to improve weight bearing tolerance. Baseline: MaxA of 2 w/ [place], relies on seat Goal status: INITIAL  4.  Pt will perform sliding board transfer after board placed minA in order to improve safety and functional independence with transfers with decreased caregiver assist. Baseline: MaxA Goal status: INITIAL  ASSESSMENT:   CLINICAL IMPRESSION: Today's skilled session continued to address bed mobility, strengthening and transfers. Pt continues to be limited in activity tolerance due to bil knee pain with movement and weight bearing. Pt does participate despite pain. The pt is slowly progressing and should benefit from continued PT to progress toward unmet goals.    OBJECTIVE IMPAIRMENTS Abnormal gait, decreased balance, decreased knowledge of  use of DME, decreased mobility, decreased ROM, decreased strength, dizziness, increased edema, impaired UE functional use, and pain.    ACTIVITY LIMITATIONS cleaning, community activity, driving, meal prep, laundry, medication management, yard work, and shopping.    PERSONAL FACTORS Age, Time since onset of injury/illness/exacerbation, and 3+ comorbidities: hypertension, cervical DDD, bilateral DVTs admitted on 01/05/2021 for bilateral hand weakness, numbness and the left leg weakness  are also affecting patient's functional outcome.      REHAB POTENTIAL: Fair     CLINICAL DECISION MAKING: Evolving/moderate complexity   EVALUATION COMPLEXITY: Moderate   PLAN: PT FREQUENCY: 2x/week for 4 weeks followed by 1x/week for 4 weeks   PT DURATION: 8 weeks   PLANNED INTERVENTIONS: Therapeutic exercises, Therapeutic activity, Neuromuscular re-education, Balance training, Gait training, Patient/Family education, Joint mobilization, Vestibular training, Canalith repositioning, Orthotic/Fit training, DME instructions, Wheelchair mobility training, Cryotherapy, Moist heat, and Manual therapy   PLAN FOR NEXT SESSION:    Stand w/ [place].  Sliding board transfers.  Weight shifting, practice multi-directional scooting EOM.  Continue multidirectional reaching and further assess trunk rotation in sitting.    Bed mobility. Is Hoyer lift transfer at this time-progressing to sliding board (continue to assess/work on). Sitting EOM.  If 2 people practice transition supine <>sit.  Continue to work on [place] strengthening in sitting and supine.  Address IR/ER of hip in supine, further stretching-especially Left ankle, glut/quad sets, bridging, heel slides-use tapping at tendon insertion, trunk rotations in sitting     Willow Ora, PTA, [facility] [address] [place],  [address] [phone] 09/29/21, 4:15 PM

## 2021-09-29 NOTE — Therapy (Signed)
OUTPATIENT OCCUPATIONAL THERAPY TREATMENT NOTE   Patient Name: [name] MRN: [id number] DOB:01-Feb-1929, 88 y.o., female Today's Date: 09/29/2021  PCP: Nolene Ebbs, MD  REFERRING PROVIDER: Nolene Ebbs, MD   END OF SESSION:   OT End of Session - 09/29/21 1331     Visit Number 12    Number of Visits 17    Date for OT Re-Evaluation 10/02/21    Authorization Type UHC Medicare and Medicaid,Covered 100% as long as she's enrolled with Medicaid  VL: Follows Medicare guidelines    Progress Note Due on Visit 68    OT Start Time 1235    OT Stop Time 38    OT Time Calculation (min) 40 min    Equipment Utilized During Treatment sliding board    Activity Tolerance Patient tolerated treatment well    Behavior During Therapy WFL for tasks assessed/performed              Past Medical History:  Diagnosis Date   Allergic rhinitis    Arthritis    Carpal tunnel syndrome    Deep venous thrombosis (HCC)    bilateral legs   Degenerative arthritis    right knee   Hypertension    Lumbar degenerative disc disease    Lump or mass in breast    Paresthesia    Syncope 05/24/2013   Vitamin D deficiency    Past Surgical History:  Procedure Laterality Date   ABDOMINAL HYSTERECTOMY     CARPAL TUNNEL RELEASE Right    CATARACT EXTRACTION Bilateral    HEMORROIDECTOMY     IVC Filter     TOTAL HIP ARTHROPLASTY Right    ULNAR NERVE TRANSPOSITION Right    Patient Active Problem List   Diagnosis Date Noted   Obesity (BMI 30-39.9) 01/28/2021   Acute CVA (cerebrovascular accident) ([place]) 01/25/2021   Acute kidney injury ([place]) 01/25/2021   CVA (cerebral vascular accident) ([place]) 01/06/2021   Essential hypertension 01/06/2021   Arthritis 01/06/2021   Pain due to onychomycosis of toenails of both feet 12/14/2018   Presbycusis of both ears 09/22/2015   Syncope 05/24/2013    ONSET DATE: 07/13/21 Referral date   REFERRING DIAG: Hemiplegia and hemiparesis following cerebral infarction  affecting left non-dominant side ([place])   THERAPY DIAG:  Unsteadiness on feet  Muscle weakness (generalized)  Hemiplegia and hemiparesis following cerebral infarction affecting left non-dominant side (HCC)  Other lack of coordination  Other disturbances of skin sensation  Attention and concentration deficit  Visuospatial deficit   PERTINENT HISTORY: vertigo, smoking, hypertension, cervical DDD, bilateral DVTs  multiple strokes (09/22, 10/22) .  right MCA territory scattered infarcts.  MRI C-spine showed C3/4 severe spinal stenosis and mild spinal cord compression.  EF 60 to 65%   PRECAUTIONS: fall  SUBJECTIVE: daughter reports contacting MD reporting needing another injection in both knees for pain relief.  PAIN:  Are you having pain? Yes NPRS scale: 0/10 at rest, fluctuates w/ movement Pain location: knees Pain orientation: Bilateral  PAIN TYPE: aching Pain description: intermittent  Aggravating factors: passive movement  Relieving factors: rest, injections      OBJECTIVE:   TODAY'S TREATMENT:  TODAY'S TREATMENT:  09/29/21 Patient's daughter shared that patient had a bad cold over the weekend perhaps explaining why sitting balance was poor on Monday.  Patient able to sit without support this session.  Skilled OT intervention to address postural control and neuromuscular reeducation to address LUE.  Patient required max assist to transfer with slide board toward right.  Patient needs facilitation for significant weight shift forward.  Patient reports knee pain (right) during transfer.  Daughter indicates that she has contacted MD office to see if patient could receive injection to R knee.  Patient reports that she is participating more consistently in [place].     09/27/21 Pt met in waiting room/lobby for start of session. Transfer from w/c to mat with Max-Total A to right with sliding board. Pt with increased posterior and lateral lean to right sitting edge of mat.  Pt req'd max A for hip and trunk in midline. Pt worked on leaning forward with use of hemi glide with LUE with max A for maintaining hand position and forward reaching with physioball in front.     HOME EXERCISE PROGRAM: 08/18/21:  Table slides BUE  09/08/21:  ADL - Donning/doffing pull over shirt   09/14/21: splint wear and care     GOALS: Goals reviewed with patient? Yes   SHORT TERM GOALS: Target date: 08/31/2021   Patient and caregiver will complete an HEP designed to improve active range of motion in LUE Baseline: Stretching program Goal status: Achieved   2.  Patient will demonstrate 30% composite flexion in digits Baseline: 20% then painful Goal status: Achieved   3.  Patient will sit at edge of mat table with intermittent min assistance x 30 seconds in preparation for active participation in ADL Baseline: 3-5 sec  Goal status: Achieved   4.  Patient will lean forward while seated at edge of mat as if reaching toward shoes/socks - reach past knees 2/3 down length of calf Baseline: just below knee Goal status: Achieved   5.  Patient will wash her face, chest and left arm after set up assistance Baseline: Dependent Goal status: Partially met - washing face only 09/01/21     LONG TERM GOALS: Target date: 09/28/2021   Patient will complete home activity program designed to improve functional use of LUE in simple self care tasks Baseline: No HAP Goal status: Ongoing   2.  Patient will report pain no greater than 3/10 in LEFT shoulder with active reach Baseline: 6/10 Goal status: Ongoing   3.  Patient will complete an active level surface transfer with max assist Baseline: dependent- hoyer Goal status: Ongoing   4.  Patient will assist with pulling up and down lower body clothing to reduce caregiver burden for dressing Baseline: Dependent Goal status: Ongoing   5.  Patient will demonstrate at least 50% composite flexion in left hand to aide with  grasp/release Baseline: 20% flex Goal status: Ongoing       ASSESSMENT:   CLINICAL IMPRESSION:   Pt continues to progress towards goals.    PERFORMANCE DEFICITS in functional skills including ADLs, IADLs, coordination, dexterity, proprioception, sensation, edema, tone, ROM, strength, pain, fascial restrictions, muscle spasms, flexibility, FMC, GMC, mobility, balance, body mechanics, endurance, cardiopulmonary status limiting function, decreased knowledge of use of DME, UE functional use, and vestibular, cognitive skills including attention, energy/drive, memory, orientation, perception, problem solving, safety awareness, and sequencing, and psychosocial skills including environmental adaptation and habits.    IMPAIRMENTS are limiting patient from ADLs, IADLs, rest and sleep, leisure, and social participation.    COMORBIDITIES may have co-morbidities  that affects occupational performance. Patient will benefit from skilled OT to address above impairments and improve overall function.   MODIFICATION OR ASSISTANCE TO COMPLETE EVALUATION: Min-Moderate modification of tasks or assist with assess necessary to complete an evaluation.   OT OCCUPATIONAL PROFILE AND HISTORY:  Detailed assessment: Review of records and additional review of physical, cognitive, psychosocial history related to current functional performance.   CLINICAL DECISION MAKING: Moderate - several treatment options, min-mod task modification necessary   REHAB POTENTIAL: Good   EVALUATION COMPLEXITY: Moderate     PLAN: OT FREQUENCY: 2x/week   OT DURATION: 8 weeks   PLANNED INTERVENTIONS: self care/ADL training, therapeutic exercise, therapeutic activity, neuromuscular re-education, manual therapy, passive range of motion, balance training, functional mobility training, splinting, electrical stimulation, ultrasound, paraffin, fluidotherapy, moist heat, cryotherapy, patient/family education, cognitive remediation/compensation,  visual/perceptual remediation/compensation, energy conservation, coping strategies training, and DME and/or AE instructions   RECOMMENDED OTHER SERVICES: Receiving PT   CONSULTED AND AGREED WITH PLAN OF CARE: Patient and family member/caregiver   PLAN FOR NEXT SESSION:  slide board transfer, scooting, sit to stand in [place]?, Upper body dressing      Mariah Milling, OT 09/29/2021, 1:34 PM

## 2021-10-04 ENCOUNTER — Ambulatory Visit: Payer: Medicare Other

## 2021-10-04 ENCOUNTER — Ambulatory Visit: Payer: Medicare Other | Admitting: Occupational Therapy

## 2021-10-04 ENCOUNTER — Encounter: Payer: Self-pay | Admitting: Occupational Therapy

## 2021-10-04 DIAGNOSIS — R208 Other disturbances of skin sensation: Secondary | ICD-10-CM

## 2021-10-04 DIAGNOSIS — R41840 Attention and concentration deficit: Secondary | ICD-10-CM

## 2021-10-04 DIAGNOSIS — R2681 Unsteadiness on feet: Secondary | ICD-10-CM

## 2021-10-04 DIAGNOSIS — M6281 Muscle weakness (generalized): Secondary | ICD-10-CM

## 2021-10-04 DIAGNOSIS — I69354 Hemiplegia and hemiparesis following cerebral infarction affecting left non-dominant side: Secondary | ICD-10-CM

## 2021-10-04 DIAGNOSIS — R41842 Visuospatial deficit: Secondary | ICD-10-CM

## 2021-10-04 DIAGNOSIS — R278 Other lack of coordination: Secondary | ICD-10-CM

## 2021-10-04 NOTE — Therapy (Signed)
OUTPATIENT OCCUPATIONAL THERAPY TREATMENT NOTE & RECERTIFICATION   Patient Name: [name] MRN: [id number] DOB:[birth year], 88 y.o., female Today's Date: 10/04/2021  PCP: Nolene Ebbs, MD  REFERRING PROVIDER: Nolene Ebbs, MD   END OF SESSION:   OT End of Session - 10/04/21 1322     Visit Number 13    Number of Visits 22   9 visits at renewal   Date for OT Re-Evaluation 12/06/21    Authorization Type [employer] Medicare and Medicaid,Covered 100% as long as she's enrolled with Medicaid  VL: Follows Medicare guidelines    Progress Note Due on Visit 23    OT Start Time 1318    OT Stop Time 1400    OT Time Calculation (min) 42 min    Equipment Utilized During Treatment sliding board    Activity Tolerance Patient tolerated treatment well    Behavior During Therapy WFL for tasks assessed/performed              Past Medical History:  Diagnosis Date   Allergic rhinitis    Arthritis    Carpal tunnel syndrome    Deep venous thrombosis (HCC)    bilateral legs   Degenerative arthritis    right knee   Hypertension    Lumbar degenerative disc disease    Lump or mass in breast    Paresthesia    Syncope 05/24/2013   Vitamin D deficiency    Past Surgical History:  Procedure Laterality Date   ABDOMINAL HYSTERECTOMY     CARPAL TUNNEL RELEASE Right    CATARACT EXTRACTION Bilateral    HEMORROIDECTOMY     IVC Filter     TOTAL HIP ARTHROPLASTY Right    ULNAR NERVE TRANSPOSITION Right    Patient Active Problem List   Diagnosis Date Noted   Obesity (BMI 30-39.9) 01/28/2021   Acute CVA (cerebrovascular accident) ([place]) 01/25/2021   Acute kidney injury ([place]) 01/25/2021   CVA (cerebral vascular accident) ([place]) 01/06/2021   Essential hypertension 01/06/2021   Arthritis 01/06/2021   Pain due to onychomycosis of toenails of both feet 12/14/2018   Presbycusis of both ears 09/22/2015   Syncope 05/24/2013    ONSET DATE: 07/13/21 Referral date   REFERRING DIAG: Hemiplegia and  hemiparesis following cerebral infarction affecting left non-dominant side ([place])   THERAPY DIAG:  Unsteadiness on feet  Muscle weakness (generalized)  Hemiplegia and hemiparesis following cerebral infarction affecting left non-dominant side (HCC)  Other lack of coordination  Other disturbances of skin sensation  Attention and concentration deficit  Visuospatial deficit   PERTINENT HISTORY: vertigo, smoking, hypertension, cervical DDD, bilateral DVTs  multiple strokes (09/22, 10/22) .  right MCA territory scattered infarcts.  MRI C-spine showed C3/4 severe spinal stenosis and mild spinal cord compression.  EF 60 to 65%   PRECAUTIONS: fall  SUBJECTIVE: daughter arrived with questions re: renewal vs discharge and scheduling more appts.   PAIN:  Are you having pain? Yes NPRS scale: 0/10 at rest, fluctuates w/ movement Pain location: knees Pain orientation: Bilateral  PAIN TYPE: aching Pain description: intermittent  Aggravating factors: passive movement  Relieving factors: rest, injections      OBJECTIVE:   TODAY'S TREATMENT:  TODAY'S TREATMENT:  10/04/21 Pt seen today for renewal for plan of care.   Reviewed goals. Pt continues to make slow and steady progress however limited with progress d/t poor carryover.  Table Slides x 10 with AAROM/SelfPROM  Worked on active flexion of LUE with minimal activation with finger flexion. Pt able  to maintain grasp on cone and 1 inch block with mod assistance at elbow for support. Pt with significant tightness in LUE impeding overall ability to move LUE actively and passively.     HOME EXERCISE PROGRAM: 08/18/21:  Table slides BUE  09/08/21:  ADL - Donning/doffing pull over shirt   09/14/21: splint wear and care     GOALS: Goals reviewed with patient? Yes   SHORT TERM GOALS: Target date: 08/31/2021   Patient and caregiver will complete an HEP designed to improve active range of motion in LUE Baseline: Stretching  program Goal status: Achieved   2.  Patient will demonstrate 30% composite flexion in digits Baseline: 20% then painful Goal status: Achieved   3.  Patient will sit at edge of mat table with intermittent min assistance x 30 seconds in preparation for active participation in ADL Baseline: 3-5 sec  Goal status: Achieved   4.  Patient will lean forward while seated at edge of mat as if reaching toward shoes/socks - reach past knees 2/3 down length of calf Baseline: just below knee Goal status: Achieved   5.  Patient will wash her face, chest and left arm after set up assistance Baseline: Dependent Goal status: Achieved 10/04/21   LONG TERM GOALS: Target date: 12/06/21   Patient will complete home activity program designed to improve functional use of LUE in simple self care tasks Baseline: No HEP Goal status: Ongoing   2.  Patient will report pain no greater than 3/10 in LEFT shoulder with active reach Baseline: 6/10 Goal status: Ongoing   3.  Patient will complete an active level surface transfer with max assist Baseline: dependent- hoyer Goal status: Ongoing - inconsistent with SB transfers   4.  Patient will assist with pulling up and down lower body clothing to reduce caregiver burden for dressing Baseline: Dependent Goal status: Ongoing - pt reports assisting some but not consistently.    5.  Patient will demonstrate at least 50% composite flexion in left hand to aide with grasp/release Baseline: 20% flex Goal status: Ongoing - approx 20% active flexion       ASSESSMENT:   CLINICAL IMPRESSION:   Renewal completed for patient for plan of care. Pt would benefit from skilled occupational therapy to continue working towards goals and increase independence with ADLs and increased carryover of HEPs.  PERFORMANCE DEFICITS in functional skills including ADLs, IADLs, coordination, dexterity, proprioception, sensation, edema, tone, ROM, strength, pain, fascial restrictions,  muscle spasms, flexibility, FMC, GMC, mobility, balance, body mechanics, endurance, cardiopulmonary status limiting function, decreased knowledge of use of DME, UE functional use, and vestibular, cognitive skills including attention, energy/drive, memory, orientation, perception, problem solving, safety awareness, and sequencing, and psychosocial skills including environmental adaptation and habits.    IMPAIRMENTS are limiting patient from ADLs, IADLs, rest and sleep, leisure, and social participation.    COMORBIDITIES may have co-morbidities  that affects occupational performance. Patient will benefit from skilled OT to address above impairments and improve overall function.   MODIFICATION OR ASSISTANCE TO COMPLETE EVALUATION: Min-Moderate modification of tasks or assist with assess necessary to complete an evaluation.   OT OCCUPATIONAL PROFILE AND HISTORY: Detailed assessment: Review of records and additional review of physical, cognitive, psychosocial history related to current functional performance.   CLINICAL DECISION MAKING: Moderate - several treatment options, min-mod task modification necessary   REHAB POTENTIAL: Good   EVALUATION COMPLEXITY: Moderate     PLAN: OT FREQUENCY: 1x/week   OT DURATION: 10 weeks 9 additional  visits at renewal over 10 weeks   PLANNED INTERVENTIONS: self care/ADL training, therapeutic exercise, therapeutic activity, neuromuscular re-education, manual therapy, passive range of motion, balance training, functional mobility training, splinting, electrical stimulation, ultrasound, paraffin, fluidotherapy, moist heat, cryotherapy, patient/family education, cognitive remediation/compensation, visual/perceptual remediation/compensation, energy conservation, coping strategies training, and DME and/or AE instructions   RECOMMENDED OTHER SERVICES: Receiving PT   CONSULTED AND AGREED WITH PLAN OF CARE: Patient and family member/caregiver   PLAN FOR NEXT SESSION:   slide board transfer, scooting, sit to stand in [place]?, Upper body dressing      Zachery Conch, OT 10/04/2021, 2:23 PM

## 2021-10-06 ENCOUNTER — Encounter: Payer: Self-pay | Admitting: Physical Therapy

## 2021-10-06 ENCOUNTER — Ambulatory Visit: Payer: Medicare Other | Admitting: Physical Therapy

## 2021-10-06 ENCOUNTER — Ambulatory Visit: Payer: Medicare Other | Admitting: Occupational Therapy

## 2021-10-06 VITALS — BP 139/65 | HR 60

## 2021-10-06 DIAGNOSIS — I69354 Hemiplegia and hemiparesis following cerebral infarction affecting left non-dominant side: Secondary | ICD-10-CM

## 2021-10-06 DIAGNOSIS — R278 Other lack of coordination: Secondary | ICD-10-CM

## 2021-10-06 DIAGNOSIS — M6281 Muscle weakness (generalized): Secondary | ICD-10-CM

## 2021-10-06 DIAGNOSIS — R41840 Attention and concentration deficit: Secondary | ICD-10-CM

## 2021-10-06 DIAGNOSIS — R208 Other disturbances of skin sensation: Secondary | ICD-10-CM

## 2021-10-06 DIAGNOSIS — R2681 Unsteadiness on feet: Secondary | ICD-10-CM

## 2021-10-06 DIAGNOSIS — R41842 Visuospatial deficit: Secondary | ICD-10-CM

## 2021-10-06 NOTE — Therapy (Signed)
OUTPATIENT OCCUPATIONAL THERAPY TREATMENT NOTE    Patient Name: [name] MRN: [id number] DOB:Sep 02, 1928, 88 y.o., female Today's Date: 10/06/2021  PCP: Nolene Ebbs, MD  REFERRING PROVIDER: Nolene Ebbs, MD   END OF SESSION:   OT End of Session - 10/06/21 1331     Visit Number 14    Number of Visits 22    Date for OT Re-Evaluation 12/06/21    Authorization Type [employer] Medicare and Medicaid,Covered 100% as long as she's enrolled with Medicaid  VL: Follows Medicare guidelines    Progress Note Due on Visit 78    OT Start Time 26    OT Stop Time 62    OT Time Calculation (min) 45 min    Equipment Utilized During Treatment sliding board    Activity Tolerance Patient limited by lethargy    Behavior During Therapy --   Lethargic             Past Medical History:  Diagnosis Date   Allergic rhinitis    Arthritis    Carpal tunnel syndrome    Deep venous thrombosis (HCC)    bilateral legs   Degenerative arthritis    right knee   Hypertension    Lumbar degenerative disc disease    Lump or mass in breast    Paresthesia    Syncope 05/24/2013   Vitamin D deficiency    Past Surgical History:  Procedure Laterality Date   ABDOMINAL HYSTERECTOMY     CARPAL TUNNEL RELEASE Right    CATARACT EXTRACTION Bilateral    HEMORROIDECTOMY     IVC Filter     TOTAL HIP ARTHROPLASTY Right    ULNAR NERVE TRANSPOSITION Right    Patient Active Problem List   Diagnosis Date Noted   Obesity (BMI 30-39.9) 01/28/2021   Acute CVA (cerebrovascular accident) ([place]) 01/25/2021   Acute kidney injury ([place]) 01/25/2021   CVA (cerebral vascular accident) ([place]) 01/06/2021   Essential hypertension 01/06/2021   Arthritis 01/06/2021   Pain due to onychomycosis of toenails of both feet 12/14/2018   Presbycusis of both ears 09/22/2015   Syncope 05/24/2013    ONSET DATE: 07/13/21 Referral date   REFERRING DIAG: Hemiplegia and hemiparesis following cerebral infarction affecting left  non-dominant side ([place])   THERAPY DIAG:  Unsteadiness on feet  Muscle weakness (generalized)  Hemiplegia and hemiparesis following cerebral infarction affecting left non-dominant side (HCC)  Other lack of coordination  Other disturbances of skin sensation  Attention and concentration deficit  Visuospatial deficit   PERTINENT HISTORY: vertigo, smoking, hypertension, cervical DDD, bilateral DVTs  multiple strokes (09/22, 10/22) .  right MCA territory scattered infarcts.  MRI C-spine showed C3/4 severe spinal stenosis and mild spinal cord compression.  EF 60 to 65%   PRECAUTIONS: fall  SUBJECTIVE: daughter arrived with questions re: renewal vs discharge and scheduling more appts.   PAIN:  Are you having pain? Yes NPRS scale: 0/10 at rest, fluctuates w/ movement Pain location: knees Pain orientation: Bilateral  PAIN TYPE: aching Pain description: intermittent  Aggravating factors: passive movement  Relieving factors: rest, injections      OBJECTIVE:   TODAY'S TREATMENT:  TODAY'S TREATMENT:  10/06/21 Patient's daughter reports patient has had periods of low BP - Diastolic number in the "14'H" BP taken at start of session 139/65, pulse 60.   Patient alert at start of session, however as session progressed, patient became more lethargic - slower to respond, and falling backward to right.   Worked to correct posture in  sitting and to address LUE active movement and stretch.  [place] hand into flexion over ball, then followed with active flexion onto rounded surface and release.  Also worked on pre-reach patterns in sitting on horizontal and then in inclined plane.   Shared with PT concerns regarding limited sitting balance and vitals from start of session.    HOME EXERCISE PROGRAM: 08/18/21:  Table slides BUE  09/08/21:  ADL - Donning/doffing pull over shirt   09/14/21: splint wear and care     GOALS: Goals reviewed with patient? Yes   SHORT TERM GOALS: Target date:  08/31/2021   Patient and caregiver will complete an HEP designed to improve active range of motion in LUE Baseline: Stretching program Goal status: Achieved   2.  Patient will demonstrate 30% composite flexion in digits Baseline: 20% then painful Goal status: Achieved   3.  Patient will sit at edge of mat table with intermittent min assistance x 30 seconds in preparation for active participation in ADL Baseline: 3-5 sec  Goal status: Achieved   4.  Patient will lean forward while seated at edge of mat as if reaching toward shoes/socks - reach past knees 2/3 down length of calf Baseline: just below knee Goal status: Achieved   5.  Patient will wash her face, chest and left arm after set up assistance Baseline: Dependent Goal status: Achieved 10/04/21   LONG TERM GOALS: Target date: 12/06/21   Patient will complete home activity program designed to improve functional use of LUE in simple self care tasks Baseline: No HEP Goal status: Ongoing   2.  Patient will report pain no greater than 3/10 in LEFT shoulder with active reach Baseline: 6/10 Goal status: Ongoing   3.  Patient will complete an active level surface transfer with max assist Baseline: dependent- hoyer Goal status: Ongoing - inconsistent with SB transfers   4.  Patient will assist with pulling up and down lower body clothing to reduce caregiver burden for dressing Baseline: Dependent Goal status: Ongoing - pt reports assisting some but not consistently.    5.  Patient will demonstrate at least 50% composite flexion in left hand to aide with grasp/release Baseline: 20% flex Goal status: Ongoing - approx 20% active flexion       ASSESSMENT:   CLINICAL IMPRESSION:   Patient with decrease in sitting balance this session - listing/falling backward and to right.  Reports feeling "a little drunk."  BP within normal range although daughter reports newer BP medication.    PERFORMANCE DEFICITS in functional skills  including ADLs, IADLs, coordination, dexterity, proprioception, sensation, edema, tone, ROM, strength, pain, fascial restrictions, muscle spasms, flexibility, FMC, GMC, mobility, balance, body mechanics, endurance, cardiopulmonary status limiting function, decreased knowledge of use of DME, UE functional use, and vestibular, cognitive skills including attention, energy/drive, memory, orientation, perception, problem solving, safety awareness, and sequencing, and psychosocial skills including environmental adaptation and habits.    IMPAIRMENTS are limiting patient from ADLs, IADLs, rest and sleep, leisure, and social participation.    COMORBIDITIES may have co-morbidities  that affects occupational performance. Patient will benefit from skilled OT to address above impairments and improve overall function.   MODIFICATION OR ASSISTANCE TO COMPLETE EVALUATION: Min-Moderate modification of tasks or assist with assess necessary to complete an evaluation.   OT OCCUPATIONAL PROFILE AND HISTORY: Detailed assessment: Review of records and additional review of physical, cognitive, psychosocial history related to current functional performance.   CLINICAL DECISION MAKING: Moderate - several treatment options, min-mod task  modification necessary   REHAB POTENTIAL: Good   EVALUATION COMPLEXITY: Moderate     PLAN: OT FREQUENCY: 1x/week   OT DURATION: 10 weeks 9 additional visits at renewal over 10 weeks   PLANNED INTERVENTIONS: self care/ADL training, therapeutic exercise, therapeutic activity, neuromuscular re-education, manual therapy, passive range of motion, balance training, functional mobility training, splinting, electrical stimulation, ultrasound, paraffin, fluidotherapy, moist heat, cryotherapy, patient/family education, cognitive remediation/compensation, visual/perceptual remediation/compensation, energy conservation, coping strategies training, and DME and/or AE instructions   RECOMMENDED OTHER  SERVICES: Receiving PT   CONSULTED AND AGREED WITH PLAN OF CARE: Patient and family member/caregiver   PLAN FOR NEXT SESSION:   Upper body dressing, NMR LUE, Postural control seated, weight shift forward, midline orientation      Mariah Milling, OT 10/06/2021, 1:37 PM

## 2021-10-06 NOTE — Therapy (Signed)
OUTPATIENT PHYSICAL THERAPY TREATMENT NOTE   Patient Name: LENISHA LACAP MRN: [id number] DOB:December 01, 1928, 88 y.o., female Today's Date: 10/06/2021  PCP: Nolene Ebbs, MD  REFERRING PROVIDER: Nolene Ebbs, MD  END OF SESSION:   PT End of Session - 10/06/21 1323     Visit Number 13    Number of Visits 29    Date for PT Re-Evaluation 11/26/21    Authorization Type [employer] medicare so 10th visit progress note    Progress Note Due on Visit 20    PT Start Time 1317    PT Stop Time 1404    PT Time Calculation (min) 47 min    Equipment Utilized During Treatment Other (comment);Gait belt   sliding board   Activity Tolerance Patient tolerated treatment well    Behavior During Therapy WFL for tasks assessed/performed                Past Medical History:  Diagnosis Date   Allergic rhinitis    Arthritis    Carpal tunnel syndrome    Deep venous thrombosis (HCC)    bilateral legs   Degenerative arthritis    right knee   Hypertension    Lumbar degenerative disc disease    Lump or mass in breast    Paresthesia    Syncope 05/24/2013   Vitamin D deficiency    Past Surgical History:  Procedure Laterality Date   ABDOMINAL HYSTERECTOMY     CARPAL TUNNEL RELEASE Right    CATARACT EXTRACTION Bilateral    HEMORROIDECTOMY     IVC Filter     TOTAL HIP ARTHROPLASTY Right    ULNAR NERVE TRANSPOSITION Right    Patient Active Problem List   Diagnosis Date Noted   Obesity (BMI 30-39.9) 01/28/2021   Acute CVA (cerebrovascular accident) ([place]) 01/25/2021   Acute kidney injury ([place]) 01/25/2021   CVA (cerebral vascular accident) ([place]) 01/06/2021   Essential hypertension 01/06/2021   Arthritis 01/06/2021   Pain due to onychomycosis of toenails of both feet 12/14/2018   Presbycusis of both ears 09/22/2015   Syncope 05/24/2013    REFERRING DIAG: I63.9 (ICD-10-CM) - CVA (cerebral vascular accident)    THERAPY DIAG:  Unsteadiness on feet  Muscle weakness  (generalized)  Hemiplegia and hemiparesis following cerebral infarction affecting left non-dominant side (HCC)  Other lack of coordination  PERTINENT HISTORY: 88 year old female with history of smoking, hypertension, cervical DDD, bilateral DVTs admitted on 01/05/2021 for bilateral hand weakness, numbness and the left leg weakness.    PRECAUTIONS: Fall   SUBJECTIVE: Pt complains of right knee pain today.   PAIN:  Are you having pain? No  VITALS: All BP readings taken in RUE while seated  Pre-session: BP 150/76  TODAY'S TREATMENT: Pt received seated at edge of mat after working with OT prior to PT session.    Sitting EOM w/ tech seated on physioball behind pt to provide intermittent support.  Pt cued to scoot forward.  Cued for use of RUE to pull with head-hip relationship to unweight bottom.  After several attempts w/ glut activation and limited movement, therapist cues pt to attempt lean back onto physioball to push hips forward.  Therapist provides minA to single LE with improved pt engagement to task.  Attempted lateral scooting with further edu about head-hip relationship w/ pt requiring maxA to scoot laterally to right.  Pt demonstrates increased fatigue and posterolateral right trunk lean following task requiring prolonged rest.  Progressed to pt sitting EOM w/o posterior support retrieving bean  bags with right hand from right side of mat tossing midline to engage core.  Progressed to targets fully on the left side of patient to engage trunk rotation.  Progressed to multidirectional retrieval of bean bags to promote functional reach outside BOS with left toss to further engage core and seated balance.  Pt requires intermittent support throughout and frequent cuing to maintain upright.  Ended session with +2 maxA sliding board transfer back to w/c and positioning due to pt fatigue. Assessed sitting Right BP end of session:  138/64, HR 60 bpm PATIENT EDUCATION: Education details:  continue with current HEP with caregivers.  Discussed having care aide sit pt at EOB when pt less fatigued to promote sitting balance and core engagement. Person educated: Patient and Child(ren) Education method: Explanation and Demonstration Education comprehension: verbalized understanding and needs further education      HOME EXERCISE PROGRAM:  Access Code: [email or web address] URL: [email or web address] Date: 08/18/2021 Prepared by: Willow Ora  Exercises - Seated Heel Raise  - 1 x daily - 5 x weekly - 1 sets - 10 reps - seated hamstring stretch  - 1 x daily - 5 x weekly - 1 sets - 3 reps - 30 seconds hold - Supine Transversus Abdominis Bracing - Hands on Ground  - 1 x daily - 5 x weekly - 1 sets - 10 reps - Hip and Knee Extension and Flexion Caregiver PROM  - 1 x daily - 5 x weekly - 1 sets - 10 reps - Supine Knee Extension Strengthening  - 1 x daily - 5 x weekly - 1 sets - 10 reps - Supine Lower Trunk Rotation  - 1 x daily - 5 x weekly - 1 sets - 5-6 reps - Supine Hip Adduction Isometric with Ball  - 1 x daily - 5 x weekly - 1 sets - 10 reps - 3 seconds hold      GOALS  Goals reviewed with patient? Yes   SHORT TERM GOALS:   Target date: 10/29/2021  Pt will be compliant to HEP with caregiver assist for stretching and strengthening x4 days a week. Baseline: Intermittent compliance as CNA available, daughter working on more consistent help. Goal status: INITIAL  2.  Pt will be able to perform slideboard transfer after board placed mod assist for improved mobility. Baseline: MaxA Goal status: INITIAL  3.  Pt will stand in [place] w/ maxA of 1 in order to work on standing tolerance and BLE weight bearing. Baseline: MaxA of 2 w/ [place] Goal status: INITIAL  4.  Pt will perform sit<>supine modA to improve functional mobility at home. Baseline: MaxA Goal status: INITIAL  LONG TERM GOALS:  Target date: 11/26/2021  Pt will report decreased pain in right knee during functional  transfers with sliding board in order to improve LE engagement to transfers and functional mobility. Baseline: 7/10 constant ache Goal status: INITIAL  2.  Pt will perform reach to floor from sitting and return to upright sitting supervision level to demonstrate improved functional balance for ADL performance. Baseline: Can reach forward and bilaterally outside BOS ~5 inches. Goal status: INITIAL  3.  Pt will tolerate standing in [place] w/o using seat x2 mins to improve weight bearing tolerance. Baseline: MaxA of 2 w/ [place], relies on seat Goal status: INITIAL  4.  Pt will perform sliding board transfer after board placed minA in order to improve safety and functional independence with transfers with decreased caregiver assist. Baseline: MaxA Goal status: INITIAL  ASSESSMENT:  CLINICAL IMPRESSION: Skilled session today limited by patient fatigue demonstrated by increased right posterolateral lean.  Pt increasingly fatigued as session progressed resulting in need for +2 transfer back to wheelchair via sliding board.  Focus of session on engaging core and trunk rotation to combat lean and promote improved sitting balance.  Will continue to follow-up at upcoming session and progress towards goals as medically able.   OBJECTIVE IMPAIRMENTS Abnormal gait, decreased balance, decreased knowledge of use of DME, decreased mobility, decreased ROM, decreased strength, dizziness, increased edema, impaired UE functional use, and pain.    ACTIVITY LIMITATIONS cleaning, community activity, driving, meal prep, laundry, medication management, yard work, and shopping.    PERSONAL FACTORS Age, Time since onset of injury/illness/exacerbation, and 3+ comorbidities: hypertension, cervical DDD, bilateral DVTs admitted on 01/05/2021 for bilateral hand weakness, numbness and the left leg weakness  are also affecting patient's functional outcome.      REHAB POTENTIAL: Fair     CLINICAL DECISION MAKING:  Evolving/moderate complexity   EVALUATION COMPLEXITY: Moderate   PLAN: PT FREQUENCY: 2x/week for 4 weeks followed by 1x/week for 4 weeks   PT DURATION: 8 weeks   PLANNED INTERVENTIONS: Therapeutic exercises, Therapeutic activity, Neuromuscular re-education, Balance training, Gait training, Patient/Family education, Joint mobilization, Vestibular training, Canalith repositioning, Orthotic/Fit training, DME instructions, Wheelchair mobility training, Cryotherapy, Moist heat, and Manual therapy   PLAN FOR NEXT SESSION:   Stand w/ [place].  Sliding board transfers.  Weight shifting, practice multi-directional scooting EOM.  Continue multidirectional reaching and further assess trunk rotation in sitting.    Bed mobility. Is H[place] lift transfer at this time-progressing to sliding board (continue to assess/work on). Sitting EOM.  If 2 people practice transition supine <>sit.  Continue to work on [place] strengthening in sitting and supine.  Address IR/ER of hip in supine, further stretching-especially Left ankle, glut/quad sets, bridging, heel slides-use tapping at tendon insertion, trunk rotations in sitting     Elease Etienne, PT, DPT Outpatient Neuro [facility] [address], [place], [place] [address] [phone] 10/06/21, 4:53 PM

## 2021-10-11 ENCOUNTER — Ambulatory Visit: Payer: Medicare Other | Admitting: Physical Therapy

## 2021-10-11 ENCOUNTER — Encounter: Payer: Self-pay | Admitting: Physical Therapy

## 2021-10-11 DIAGNOSIS — R2681 Unsteadiness on feet: Secondary | ICD-10-CM

## 2021-10-11 DIAGNOSIS — M6281 Muscle weakness (generalized): Secondary | ICD-10-CM

## 2021-10-11 DIAGNOSIS — I69354 Hemiplegia and hemiparesis following cerebral infarction affecting left non-dominant side: Secondary | ICD-10-CM

## 2021-10-12 ENCOUNTER — Ambulatory Visit (INDEPENDENT_AMBULATORY_CARE_PROVIDER_SITE_OTHER): Payer: Medicare Other | Admitting: Orthopaedic Surgery

## 2021-10-12 ENCOUNTER — Encounter: Payer: Self-pay | Admitting: Orthopaedic Surgery

## 2021-10-12 DIAGNOSIS — M1712 Unilateral primary osteoarthritis, left knee: Secondary | ICD-10-CM

## 2021-10-12 DIAGNOSIS — M1711 Unilateral primary osteoarthritis, right knee: Secondary | ICD-10-CM

## 2021-10-12 DIAGNOSIS — M170 Bilateral primary osteoarthritis of knee: Secondary | ICD-10-CM | POA: Diagnosis not present

## 2021-10-12 MED ORDER — LIDOCAINE HCL 1 % IJ SOLN
3.0000 mL | INTRAMUSCULAR | Status: AC | PRN
  Administered 2021-10-12: 3 mL

## 2021-10-12 MED ORDER — HYLAN G-F 20 48 MG/6ML IX SOSY
48.0000 mg | PREFILLED_SYRINGE | INTRA_ARTICULAR | Status: AC | PRN
  Administered 2021-10-12: 48 mg via INTRA_ARTICULAR

## 2021-10-12 MED ORDER — LIDOCAINE HCL 1 % IJ SOLN
3.0000 mL | INTRAMUSCULAR | Status: AC | PRN
Start: 2021-10-12 — End: 2021-10-12
  Administered 2021-10-12: 3 mL

## 2021-10-13 ENCOUNTER — Ambulatory Visit: Payer: Medicare Other

## 2021-10-13 DIAGNOSIS — R2681 Unsteadiness on feet: Secondary | ICD-10-CM

## 2021-10-13 DIAGNOSIS — I69354 Hemiplegia and hemiparesis following cerebral infarction affecting left non-dominant side: Secondary | ICD-10-CM | POA: Diagnosis not present

## 2021-10-13 DIAGNOSIS — M6281 Muscle weakness (generalized): Secondary | ICD-10-CM

## 2021-10-13 DIAGNOSIS — R278 Other lack of coordination: Secondary | ICD-10-CM

## 2021-10-13 NOTE — Therapy (Signed)
OUTPATIENT PHYSICAL THERAPY TREATMENT NOTE   Patient Name: [name] MRN: [id number] DOB:May 01, 1928, 88 y.o., female Today's Date: 10/13/2021  PCP: Nolene Ebbs, MD  REFERRING PROVIDER: Nolene Ebbs, MD  END OF SESSION:   PT End of Session - 10/13/21 1404     Visit Number 15    Number of Visits 29    Date for PT Re-Evaluation 11/26/21    Authorization Type [employer] medicare so 10th visit progress note    Progress Note Due on Visit 20    PT Start Time 1401    PT Stop Time 1445    PT Time Calculation (min) 44 min    Equipment Utilized During Treatment Gait belt    Activity Tolerance Patient tolerated treatment well    Behavior During Therapy WFL for tasks assessed/performed                Past Medical History:  Diagnosis Date   Allergic rhinitis    Arthritis    Carpal tunnel syndrome    Deep venous thrombosis (HCC)    bilateral legs   Degenerative arthritis    right knee   Hypertension    Lumbar degenerative disc disease    Lump or mass in breast    Paresthesia    Syncope 05/24/2013   Vitamin D deficiency    Past Surgical History:  Procedure Laterality Date   ABDOMINAL HYSTERECTOMY     CARPAL TUNNEL RELEASE Right    CATARACT EXTRACTION Bilateral    HEMORROIDECTOMY     IVC Filter     TOTAL HIP ARTHROPLASTY Right    ULNAR NERVE TRANSPOSITION Right    Patient Active Problem List   Diagnosis Date Noted   Obesity (BMI 30-39.9) 01/28/2021   Acute CVA (cerebrovascular accident) ([place]) 01/25/2021   Acute kidney injury ([place]) 01/25/2021   CVA (cerebral vascular accident) ([place]) 01/06/2021   Essential hypertension 01/06/2021   Arthritis 01/06/2021   Pain due to onychomycosis of toenails of both feet 12/14/2018   Presbycusis of both ears 09/22/2015   Syncope 05/24/2013    REFERRING DIAG: I63.9 (ICD-10-CM) - CVA (cerebral vascular accident)    THERAPY DIAG:  Muscle weakness (generalized)  Unsteadiness on feet  Other lack of coordination  PERTINENT  HISTORY: 88 year old female with history of smoking, hypertension, cervical DDD, bilateral DVTs admitted on 01/05/2021 for bilateral hand weakness, numbness and the left leg weakness.    PRECAUTIONS: Fall   SUBJECTIVE: Patient reports doing well- no falls at home. She reports getting her "knee shots" yesterday. Per family, no fluid on knees. Family requesting info on in-home care, stating that insurance may have approved them for more hours of support.    PAIN:  Are you having pain? No  TODAY'S TREATMENT: Theract: -trial STS x5 TotalA x2 (unable to clear bottom due to strong posterior lean/fear of falling(?), inadequate posterior chain strength and poor effort put forth) -[place] to PT assist-> ant ball roll out -extensive conversation with both patient and family member regarding effort put forth in HEP and in therapy in order to see meaningful progress  -PT provided daughter with info on in-home care per her request -HEP (see below)   PATIENT EDUCATION: Education details: Continue HEP, in-home care and effort levels Person educated: Patient and Child(ren) Education method: Customer service manager Education comprehension: verbalized understanding and needs further education      HOME EXERCISE PROGRAM:  Access Code: [email or web address] URL: [email or web address] Date: 08/18/2021 Prepared by: Willow Ora  Exercises - Seated Heel  Raise  - 1 x daily - 5 x weekly - 1 sets - 10 reps - seated hamstring stretch  - 1 x daily - 5 x weekly - 1 sets - 3 reps - 30 seconds hold - Supine Transversus Abdominis Bracing - Hands on Ground  - 1 x daily - 5 x weekly - 1 sets - 10 reps - Hip and Knee Extension and Flexion Caregiver PROM  - 1 x daily - 5 x weekly - 1 sets - 10 reps - Supine Knee Extension Strengthening  - 1 x daily - 5 x weekly - 1 sets - 10 reps - Supine Lower Trunk Rotation  - 1 x daily - 5 x weekly - 1 sets - 5-6 reps - Supine Hip Adduction Isometric with Ball  - 1 x daily  - 5 x weekly - 1 sets - 10 reps - 3 seconds hold  - Seated [place]  - 1 x daily - 7 x weekly - 3 sets - 10 reps - Seated Long Arc Quad  - 1 x daily - 7 x weekly - 3 sets - 10 reps - Seated Hip Abduction  - 1 x daily - 7 x weekly - 3 sets - 10 reps  GOALS  Goals reviewed with patient? Yes   SHORT TERM GOALS:   Target date: 10/29/2021  Pt will be compliant to HEP with caregiver assist for stretching and strengthening x4 days a week. Baseline: Intermittent compliance as CNA available, daughter working on more consistent help. Goal status: INITIAL  2.  Pt will be able to perform slideboard transfer after board placed mod assist for improved mobility. Baseline: MaxA Goal status: INITIAL  3.  Pt will stand in [place] w/ maxA of 1 in order to work on standing tolerance and BLE weight bearing. Baseline: MaxA of 2 w/ [place] Goal status: INITIAL  4.  Pt will perform sit<>supine modA to improve functional mobility at home. Baseline: MaxA Goal status: INITIAL  LONG TERM GOALS:  Target date: 11/26/2021  Pt will report decreased pain in right knee during functional transfers with sliding board in order to improve LE engagement to transfers and functional mobility. Baseline: 7/10 constant ache Goal status: INITIAL  2.  Pt will perform reach to floor from sitting and return to upright sitting supervision level to demonstrate improved functional balance for ADL performance. Baseline: Can reach forward and bilaterally outside BOS ~5 inches. Goal status: INITIAL  3.  Pt will tolerate standing in [place] w/o using seat x2 mins to improve weight bearing tolerance. Baseline: MaxA of 2 w/ [place], relies on seat Goal status: INITIAL  4.  Pt will perform sliding board transfer after board placed minA in order to improve safety and functional independence with transfers with decreased caregiver assist. Baseline: MaxA Goal status: INITIAL  ASSESSMENT:   CLINICAL IMPRESSION: Patient seen for skilled PT  session with emphasis on anterior weight shifts and attempting progression to standing. Patient unable to progress to standing at this time despite recent knee injections to help mitigate pain. She remains with profound difficulty initiating sit <> stand despite skilled [place] x2. Discussed with patient and family the importance of putting forth effort in therapy and with HEP in order to see meaningful progress. Patient questionably receptive to this conversation. She is able to initiate some movement in her L LE, though unable to move through functional ROM at this time. Continue POC.    OBJECTIVE IMPAIRMENTS Abnormal gait, decreased balance, decreased knowledge of use of  DME, decreased mobility, decreased ROM, decreased strength, dizziness, increased edema, impaired UE functional use, and pain.    ACTIVITY LIMITATIONS cleaning, community activity, driving, meal prep, laundry, medication management, yard work, and shopping.    PERSONAL FACTORS Age, Time since onset of injury/illness/exacerbation, and 3+ comorbidities: hypertension, cervical DDD, bilateral DVTs admitted on 01/05/2021 for bilateral hand weakness, numbness and the left leg weakness  are also affecting patient's functional outcome.      REHAB POTENTIAL: Fair     CLINICAL DECISION MAKING: Evolving/moderate complexity   EVALUATION COMPLEXITY: Moderate   PLAN: PT FREQUENCY: 2x/week for 4 weeks followed by 1x/week for 4 weeks   PT DURATION: 8 weeks   PLANNED INTERVENTIONS: Therapeutic exercises, Therapeutic activity, Neuromuscular re-education, Balance training, Gait training, Patient/Family education, Joint mobilization, Vestibular training, Canalith repositioning, Orthotic/Fit training, DME instructions, Wheelchair mobility training, Cryotherapy, Moist heat, and Manual therapy   PLAN FOR NEXT SESSION:     Sliding board transfers.  Weight shifting, practice multi-directional scooting EOM.  Continue multidirectional reaching and  further assess trunk rotation in sitting.    Bed mobility. Is [place]oyer lift transfer at this time-progressing to sliding board (continue to assess/work on). Sitting EOM.  If 2 people practice transition supine <>sit.  Continue to work on [place] strengthening in sitting and supine.  Address IR/ER of hip in supine, further stretching-especially Left ankle, glut/quad sets, bridging, heel slides-use tapping at tendon insertion, trunk rotations in sitting, anterior weight shifts   Debbora Dus, PT, DPT, CBIS

## 2021-10-18 ENCOUNTER — Ambulatory Visit: Payer: Medicare Other | Attending: Internal Medicine

## 2021-10-18 ENCOUNTER — Ambulatory Visit: Payer: Medicare Other | Admitting: Occupational Therapy

## 2021-10-18 DIAGNOSIS — R208 Other disturbances of skin sensation: Secondary | ICD-10-CM | POA: Diagnosis present

## 2021-10-18 DIAGNOSIS — R2681 Unsteadiness on feet: Secondary | ICD-10-CM | POA: Diagnosis present

## 2021-10-18 DIAGNOSIS — R41842 Visuospatial deficit: Secondary | ICD-10-CM | POA: Diagnosis present

## 2021-10-18 DIAGNOSIS — I69354 Hemiplegia and hemiparesis following cerebral infarction affecting left non-dominant side: Secondary | ICD-10-CM

## 2021-10-18 DIAGNOSIS — R2689 Other abnormalities of gait and mobility: Secondary | ICD-10-CM | POA: Diagnosis present

## 2021-10-18 DIAGNOSIS — R278 Other lack of coordination: Secondary | ICD-10-CM | POA: Diagnosis present

## 2021-10-18 DIAGNOSIS — M6281 Muscle weakness (generalized): Secondary | ICD-10-CM

## 2021-10-18 DIAGNOSIS — R41840 Attention and concentration deficit: Secondary | ICD-10-CM | POA: Diagnosis present

## 2021-10-18 NOTE — Therapy (Signed)
OUTPATIENT PHYSICAL THERAPY TREATMENT NOTE   Patient Name: [name] MRN: [id number] DOB:January 06, 1929, 88 y.o., female Today's Date: 10/18/2021  PCP: Nolene Ebbs, MD  REFERRING PROVIDER: Nolene Ebbs, MD  END OF SESSION:   PT End of Session - 10/18/21 1404     Visit Number 16    Number of Visits 29    Date for PT Re-Evaluation 11/26/21    Authorization Type [employer] medicare so 10th visit progress note    Progress Note Due on Visit 20    PT Start Time 1403    PT Stop Time 1445    PT Time Calculation (min) 42 min    Activity Tolerance Patient tolerated treatment well    Behavior During Therapy WFL for tasks assessed/performed                Past Medical History:  Diagnosis Date   Allergic rhinitis    Arthritis    Carpal tunnel syndrome    Deep venous thrombosis (HCC)    bilateral legs   Degenerative arthritis    right knee   Hypertension    Lumbar degenerative disc disease    Lump or mass in breast    Paresthesia    Syncope 05/24/2013   Vitamin D deficiency    Past Surgical History:  Procedure Laterality Date   ABDOMINAL HYSTERECTOMY     CARPAL TUNNEL RELEASE Right    CATARACT EXTRACTION Bilateral    HEMORROIDECTOMY     IVC Filter     TOTAL HIP ARTHROPLASTY Right    ULNAR NERVE TRANSPOSITION Right    Patient Active Problem List   Diagnosis Date Noted   Obesity (BMI 30-39.9) 01/28/2021   Acute CVA (cerebrovascular accident) ([place]) 01/25/2021   Acute kidney injury ([place]) 01/25/2021   CVA (cerebral vascular accident) ([place]) 01/06/2021   Essential hypertension 01/06/2021   Arthritis 01/06/2021   Pain due to onychomycosis of toenails of both feet 12/14/2018   Presbycusis of both ears 09/22/2015   Syncope 05/24/2013    REFERRING DIAG: I63.9 (ICD-10-CM) - CVA (cerebral vascular accident)    THERAPY DIAG:  Muscle weakness (generalized)  Unsteadiness on feet  PERTINENT HISTORY: 88 year old female with history of smoking, hypertension, cervical DDD,  bilateral DVTs admitted on 01/05/2021 for bilateral hand weakness, numbness and the left leg weakness.    PRECAUTIONS: Fall   SUBJECTIVE: Patient reports doing well. No falls/near falls. Per dtr, patient has been approved for additional hours at home, but unsure of the actual hours.   PAIN:  Are you having pain? No  TODAY'S TREATMENT: Theract: -anterior ball roll out with resistance 3x5 -isometric core on peanut ball 3x10  -AAROM L hip flexion  -AAROM L LAQ  -SBT back to chair dependent + MinA -scifit x4 mins B LE only    PATIENT EDUCATION: Education details: Continue HEP, in-home care and effort levels Person educated: Patient and Child(ren) Education method: Explanation and Demonstration Education comprehension: verbalized understanding and needs further education      HOME EXERCISE PROGRAM:  Access Code: [email or web address] URL: [email or web address] Date: 08/18/2021 Prepared by: Willow Ora  Exercises - Seated Heel Raise  - 1 x daily - 5 x weekly - 1 sets - 10 reps - seated hamstring stretch  - 1 x daily - 5 x weekly - 1 sets - 3 reps - 30 seconds hold - Supine Transversus Abdominis Bracing - Hands on Ground  - 1 x daily - 5 x weekly - 1 sets - 10 reps - Hip  and Knee Extension and Flexion Caregiver PROM  - 1 x daily - 5 x weekly - 1 sets - 10 reps - Supine Knee Extension Strengthening  - 1 x daily - 5 x weekly - 1 sets - 10 reps - Supine Lower Trunk Rotation  - 1 x daily - 5 x weekly - 1 sets - 5-6 reps - Supine Hip Adduction Isometric with Ball  - 1 x daily - 5 x weekly - 1 sets - 10 reps - 3 seconds hold  - Seated March  - 1 x daily - 7 x weekly - 3 sets - 10 reps - Seated Long Arc Quad  - 1 x daily - 7 x weekly - 3 sets - 10 reps - Seated Hip Abduction  - 1 x daily - 7 x weekly - 3 sets - 10 reps  GOALS  Goals reviewed with patient? Yes   SHORT TERM GOALS:   Target date: 10/29/2021  Pt will be compliant to HEP with caregiver assist for stretching and  strengthening x4 days a week. Baseline: Intermittent compliance as CNA available, daughter working on more consistent help. Goal status: INITIAL  2.  Pt will be able to perform slideboard transfer after board placed mod assist for improved mobility. Baseline: MaxA Goal status: INITIAL  3.  Pt will stand in [place] w/ maxA of 1 in order to work on standing tolerance and BLE weight bearing. Baseline: MaxA of 2 w/ [place] Goal status: INITIAL  4.  Pt will perform sit<>supine modA to improve functional mobility at home. Baseline: MaxA Goal status: INITIAL  LONG TERM GOALS:  Target date: 11/26/2021  Pt will report decreased pain in right knee during functional transfers with sliding board in order to improve LE engagement to transfers and functional mobility. Baseline: 7/10 constant ache Goal status: INITIAL  2.  Pt will perform reach to floor from sitting and return to upright sitting supervision level to demonstrate improved functional balance for ADL performance. Baseline: Can reach forward and bilaterally outside BOS ~5 inches. Goal status: INITIAL  3.  Pt will tolerate standing in [place] w/o using seat x2 mins to improve weight bearing tolerance. Baseline: MaxA of 2 w/ [place], relies on seat Goal status: INITIAL  4.  Pt will perform sliding board transfer after board placed minA in order to improve safety and functional independence with transfers with decreased caregiver assist. Baseline: MaxA Goal status: INITIAL  ASSESSMENT:   CLINICAL IMPRESSION: Patient seen for skilled PT session with emphasis on anterior weight shifts functional LE strengthening. She remains with fair engagement with exercises and requires encouragement to put forth more effort. Patient able to achieve minimal AROM with hip flexion and LAQ, but improving initiation. Continue POC.    OBJECTIVE IMPAIRMENTS Abnormal gait, decreased balance, decreased knowledge of use of DME, decreased mobility, decreased ROM,  decreased strength, dizziness, increased edema, impaired UE functional use, and pain.    ACTIVITY LIMITATIONS cleaning, community activity, driving, meal prep, laundry, medication management, yard work, and shopping.    PERSONAL FACTORS Age, Time since onset of injury/illness/exacerbation, and 3+ comorbidities: hypertension, cervical DDD, bilateral DVTs admitted on 01/05/2021 for bilateral hand weakness, numbness and the left leg weakness  are also affecting patient's functional outcome.      REHAB POTENTIAL: Fair     CLINICAL DECISION MAKING: Evolving/moderate complexity   EVALUATION COMPLEXITY: Moderate   PLAN: PT FREQUENCY: 2x/week for 4 weeks followed by 1x/week for 4 weeks   PT DURATION: 8 weeks  PLANNED INTERVENTIONS: Therapeutic exercises, Therapeutic activity, Neuromuscular re-education, Balance training, Gait training, Patient/Family education, Joint mobilization, Vestibular training, Canalith repositioning, Orthotic/Fit training, DME instructions, Wheelchair mobility training, Cryotherapy, Moist heat, and Manual therapy   PLAN FOR NEXT SESSION:     Sliding board transfers.  Weight shifting, practice multi-directional scooting EOM.  Continue multidirectional reaching and further assess trunk rotation in sitting.    Bed mobility. Is Hoyer lift transfer at this time-progressing to sliding board (continue to assess/work on). Sitting EOM.  If 2 people practice transition supine <>sit.  Continue to work on [place] strengthening in sitting and supine.  Address IR/ER of hip in supine, further stretching-especially Left ankle, glut/quad sets, bridging, heel slides-use tapping at tendon insertion, trunk rotations in sitting, anterior weight shifts   Debbora Dus, PT, DPT, CBIS

## 2021-10-18 NOTE — Therapy (Signed)
OUTPATIENT OCCUPATIONAL THERAPY TREATMENT NOTE    Patient Name: [name] MRN: [id number] DOB:08-07-1928, 88 y.o., female Today's Date: 10/18/2021  PCP: Nolene Ebbs, MD  REFERRING PROVIDER: Nolene Ebbs, MD   END OF SESSION:   OT End of Session - 10/18/21 1507     Visit Number 15    Number of Visits 22    Date for OT Re-Evaluation 12/06/21    Authorization Type UHC Medicare and Medicaid,Covered 100% as long as she's enrolled with Medicaid  VL: Follows Medicare guidelines    OT Start Time 1330    OT Stop Time 1400    OT Time Calculation (min) 30 min    Equipment Utilized During Treatment sliding board, sarah    Activity Tolerance Patient tolerated treatment well    Behavior During Therapy WFL for tasks assessed/performed              Past Medical History:  Diagnosis Date   Allergic rhinitis    Arthritis    Carpal tunnel syndrome    Deep venous thrombosis (HCC)    bilateral legs   Degenerative arthritis    right knee   Hypertension    Lumbar degenerative disc disease    Lump or mass in breast    Paresthesia    Syncope 05/24/2013   Vitamin D deficiency    Past Surgical History:  Procedure Laterality Date   ABDOMINAL HYSTERECTOMY     CARPAL TUNNEL RELEASE Right    CATARACT EXTRACTION Bilateral    HEMORROIDECTOMY     IVC Filter     TOTAL HIP ARTHROPLASTY Right    ULNAR NERVE TRANSPOSITION Right    Patient Active Problem List   Diagnosis Date Noted   Obesity (BMI 30-39.9) 01/28/2021   Acute CVA (cerebrovascular accident) ([place]) 01/25/2021   Acute kidney injury ([employer]) 01/25/2021   CVA (cerebral vascular accident) ([place]) 01/06/2021   Essential hypertension 01/06/2021   Arthritis 01/06/2021   Pain due to onychomycosis of toenails of both feet 12/14/2018   Presbycusis of both ears 09/22/2015   Syncope 05/24/2013    ONSET DATE: 07/13/21 Referral date   REFERRING DIAG: Hemiplegia and hemiparesis following cerebral infarction affecting left  non-dominant side ([place])   THERAPY DIAG:  Hemiplegia and hemiparesis following cerebral infarction affecting left non-dominant side (HCC)  Muscle weakness (generalized)  Unsteadiness on feet   PERTINENT HISTORY: vertigo, smoking, hypertension, cervical DDD, bilateral DVTs  multiple strokes (09/22, 10/22) .  right MCA territory scattered infarcts.  MRI C-spine showed C3/4 severe spinal stenosis and mild spinal cord compression.  EF 60 to 65%   PRECAUTIONS: fall  SUBJECTIVE: daughter arrived with questions re: renewal vs discharge and scheduling more appts.   PAIN:  Are you having pain? Yes NPRS scale: 0/10 at rest, fluctuates w/ movement Pain location: knees Pain orientation: Bilateral  PAIN TYPE: aching Pain description: intermittent  Aggravating factors: passive movement  Relieving factors: rest, injections      OBJECTIVE:   TODAY'S TREATMENT:  TODAY'S TREATMENT:  10/18/21: Transferred from w/c to mat using sliding board w/ max assist x 2. Worked on forward reaching and cross body reaching RUE w/ slight wt bearing as tolerated over LUE. Pt leans posteriorly and requires max cues and motivation to stay forward.   Practiced standing x2 w/ use of Sarah lift - pt able to tolerate almost  2 minutes of standing w/ 2nd attempt. (P.T. assisted in setting up Sarah and hooking loops, and rehab tech behind pt to provide support)  HOME EXERCISE PROGRAM: 08/18/21:  Table slides BUE  09/08/21:  ADL - Donning/doffing pull over shirt   09/14/21: splint wear and care     GOALS: Goals reviewed with patient? Yes   SHORT TERM GOALS: Target date: 08/31/2021   Patient and caregiver will complete an HEP designed to improve active range of motion in LUE Baseline: Stretching program Goal status: Achieved   2.  Patient will demonstrate 30% composite flexion in digits Baseline: 20% then painful Goal status: Achieved   3.  Patient will sit at edge of mat table with intermittent min  assistance x 30 seconds in preparation for active participation in ADL Baseline: 3-5 sec  Goal status: Achieved   4.  Patient will lean forward while seated at edge of mat as if reaching toward shoes/socks - reach past knees 2/3 down length of calf Baseline: just below knee Goal status: Achieved   5.  Patient will wash her face, chest and left arm after set up assistance Baseline: Dependent Goal status: Achieved 10/04/21   LONG TERM GOALS: Target date: 12/06/21   Patient will complete home activity program designed to improve functional use of LUE in simple self care tasks Baseline: No HEP Goal status: Ongoing   2.  Patient will report pain no greater than 3/10 in LEFT shoulder with active reach Baseline: 6/10 Goal status: Ongoing   3.  Patient will complete an active level surface transfer with max assist Baseline: dependent- hoyer Goal status: Ongoing - inconsistent with SB transfers   4.  Patient will assist with pulling up and down lower body clothing to reduce caregiver burden for dressing Baseline: Dependent Goal status: Ongoing - pt reports assisting some but not consistently.    5.  Patient will demonstrate at least 50% composite flexion in left hand to aide with grasp/release Baseline: 20% flex Goal status: Ongoing - approx 20% active flexion       ASSESSMENT:   CLINICAL IMPRESSION:   Patient more alert today than last session and participating during session, but needs cues to stay forward in static sitting balance and to assist w/ sliding board transfers.  PERFORMANCE DEFICITS in functional skills including ADLs, IADLs, coordination, dexterity, proprioception, sensation, edema, tone, ROM, strength, pain, fascial restrictions, muscle spasms, flexibility, FMC, GMC, mobility, balance, body mechanics, endurance, cardiopulmonary status limiting function, decreased knowledge of use of DME, UE functional use, and vestibular, cognitive skills including attention,  energy/drive, memory, orientation, perception, problem solving, safety awareness, and sequencing, and psychosocial skills including environmental adaptation and habits.    IMPAIRMENTS are limiting patient from ADLs, IADLs, rest and sleep, leisure, and social participation.    COMORBIDITIES may have co-morbidities  that affects occupational performance. Patient will benefit from skilled OT to address above impairments and improve overall function.   MODIFICATION OR ASSISTANCE TO COMPLETE EVALUATION: Min-Moderate modification of tasks or assist with assess necessary to complete an evaluation.   OT OCCUPATIONAL PROFILE AND HISTORY: Detailed assessment: Review of records and additional review of physical, cognitive, psychosocial history related to current functional performance.   CLINICAL DECISION MAKING: Moderate - several treatment options, min-mod task modification necessary   REHAB POTENTIAL: Good   EVALUATION COMPLEXITY: Moderate     PLAN: OT FREQUENCY: 1x/week   OT DURATION: 10 weeks 9 additional visits at renewal over 10 weeks   PLANNED INTERVENTIONS: self care/ADL training, therapeutic exercise, therapeutic activity, neuromuscular re-education, manual therapy, passive range of motion, balance training, functional mobility training, splinting, electrical stimulation, ultrasound, paraffin, fluidotherapy, moist  heat, cryotherapy, patient/family education, cognitive remediation/compensation, visual/perceptual remediation/compensation, energy conservation, coping strategies training, and DME and/or AE instructions   RECOMMENDED OTHER SERVICES: Receiving PT   CONSULTED AND AGREED WITH PLAN OF CARE: Patient and family member/caregiver   PLAN FOR NEXT SESSION:   Upper body dressing, NMR LUE, Postural control seated, weight shift forward, midline orientation      Hans Eden, OT 10/18/2021, 3:08 PM

## 2021-10-20 ENCOUNTER — Ambulatory Visit: Payer: Medicare Other | Admitting: Physical Therapy

## 2021-10-25 ENCOUNTER — Encounter: Payer: Self-pay | Admitting: Physical Therapy

## 2021-10-25 ENCOUNTER — Encounter: Payer: Self-pay | Admitting: Occupational Therapy

## 2021-10-25 ENCOUNTER — Ambulatory Visit: Payer: Medicare Other | Admitting: Occupational Therapy

## 2021-10-25 ENCOUNTER — Ambulatory Visit: Payer: Medicare Other | Admitting: Physical Therapy

## 2021-10-25 VITALS — BP 120/56 | HR 58

## 2021-10-25 DIAGNOSIS — R208 Other disturbances of skin sensation: Secondary | ICD-10-CM

## 2021-10-25 DIAGNOSIS — R41840 Attention and concentration deficit: Secondary | ICD-10-CM

## 2021-10-25 DIAGNOSIS — M6281 Muscle weakness (generalized): Secondary | ICD-10-CM | POA: Diagnosis not present

## 2021-10-25 DIAGNOSIS — R278 Other lack of coordination: Secondary | ICD-10-CM

## 2021-10-25 DIAGNOSIS — R2681 Unsteadiness on feet: Secondary | ICD-10-CM

## 2021-10-25 DIAGNOSIS — I69354 Hemiplegia and hemiparesis following cerebral infarction affecting left non-dominant side: Secondary | ICD-10-CM

## 2021-10-25 DIAGNOSIS — R41842 Visuospatial deficit: Secondary | ICD-10-CM

## 2021-10-25 NOTE — Therapy (Signed)
OUTPATIENT PHYSICAL THERAPY TREATMENT NOTE   Patient Name: [name] MRN: [id number] DOB:15-Dec-1928, 88 y.o., female Today's Date: 10/25/2021  PCP: Nolene Ebbs, MD  REFERRING PROVIDER: Nolene Ebbs, MD  END OF SESSION:   PT End of Session - 10/25/21 1411     Visit Number 17    Number of Visits 29    Date for PT Re-Evaluation 11/26/21    Authorization Type [employer] medicare so 10th visit progress note    Progress Note Due on Visit 51    PT Start Time 1403   received from OT   PT Stop Time 1453    PT Time Calculation (min) 50 min    Equipment Utilized During Treatment Gait belt    Activity Tolerance Patient tolerated treatment well    Behavior During Therapy WFL for tasks assessed/performed                Past Medical History:  Diagnosis Date   Allergic rhinitis    Arthritis    Carpal tunnel syndrome    Deep venous thrombosis (HCC)    bilateral legs   Degenerative arthritis    right knee   Hypertension    Lumbar degenerative disc disease    Lump or mass in breast    Paresthesia    Syncope 05/24/2013   Vitamin D deficiency    Past Surgical History:  Procedure Laterality Date   ABDOMINAL HYSTERECTOMY     CARPAL TUNNEL RELEASE Right    CATARACT EXTRACTION Bilateral    HEMORROIDECTOMY     IVC Filter     TOTAL HIP ARTHROPLASTY Right    ULNAR NERVE TRANSPOSITION Right    Patient Active Problem List   Diagnosis Date Noted   Obesity (BMI 30-39.9) 01/28/2021   Acute CVA (cerebrovascular accident) ([place]) 01/25/2021   Acute kidney injury ([place]) 01/25/2021   CVA (cerebral vascular accident) ([place]) 01/06/2021   Essential hypertension 01/06/2021   Arthritis 01/06/2021   Pain due to onychomycosis of toenails of both feet 12/14/2018   Presbycusis of both ears 09/22/2015   Syncope 05/24/2013    REFERRING DIAG: I63.9 (ICD-10-CM) - CVA (cerebral vascular accident)    THERAPY DIAG:  Hemiplegia and hemiparesis following cerebral infarction affecting left  non-dominant side (HCC)  Muscle weakness (generalized)  Unsteadiness on feet  PERTINENT HISTORY: 88 year old female with history of smoking, hypertension, cervical DDD, bilateral DVTs admitted on 01/05/2021 for bilateral hand weakness, numbness and the left leg weakness.    PRECAUTIONS: Fall   SUBJECTIVE: Patient's daughter states patient's hip has been bothering her for the past couple weeks and they've been addressing with pillows to position her and heat as needed with mild relief.  She states her mom tries to move the best she can.  PAIN:  Are you having pain? Yes: NPRS scale: 8/10 Pain location: Right knee Pain description: OA related Aggravating factors: weight-bearing Relieving factors: voltaren mildly helped  TODAY'S TREATMENT: Self-care and home management: Extensive discussion of current functional level, plateau in progress and recurrence of posterior lean.  Discussed home setup for potential use of sliding board for level transfers to and from patient's hospital bed.  Further discussion of sliding board transfers, in-home aide and daughter's frustration with home health agencies.  Discussed pursuit of respite care with daughter as she becomes labile mid-discussion.  Discussed safe cuing of sliding board transfers w/ teachback to daughter w/ daughter stating she feels comfortable passing this information on to the aide at home.  Daughter is adamant that  she is not physically able to perform transfers.  Discussed out-of-bed schedule and progression with daughter to help patient stay upright and alert in hospital bed vs w/c w/ use of pillows to reposition and hoyer to offload for 2 minutes at a time as needed.  Daughter states she feels comfortable with attempting these things at home.  Discussed pt motivation to help with transfers and work on leg mobility in bed when daughter is working.  Pt states she will try to do more.  Discussed realistic goals with pt and daughter and functional  plateau with transition to focusing on most functional way to transfer at home and opportunities for furthered pt engagement in care at home to promote maintenance of gains.  Edu on finishing visits in current cert w/o re-cert to promote transition into self-maintenance at home.  Daughter in agreement to this plan at end of session.  Pt performs sliding board transfer maxA x1 to right and left.  When transferring right pt demonstrates pushing w/ RUE requiring increased cuing and eventual placement of the arm in lap as pt unable to complete transfer without pushing.  Discussed having care aide transfer to the left when at home with ability to perform level or downhill transfers w/ hospital bed and w/c.  Pt better able to assist with intermittent pushing with RUE when transferring left.  PATIENT EDUCATION: Education details: Continue HEP.  Re-iterated in-home care and effort levels and reviewed discussion from OT about sitting upright more often at home.  Discussed continued pain management efforts with positioning, ice, heat, voltaren as needed.  Discussed concerns with and monitoring of diastolic BP that daughter has, she states the MD cut losartan in half to address this but that mother has only been taking this way for 4 days.  Pt's BP has been 120s/low 80s which daughter states is unusual for pt.  See self-care section for further details. Person educated: Patient and Child(ren) Education method: Explanation and Demonstration Education comprehension: verbalized understanding and needs further education      HOME EXERCISE PROGRAM:  Access Code: [email or web address] URL: [email or web address] Date: 08/18/2021 Prepared by: Willow Ora  Exercises - Seated Heel Raise  - 1 x daily - 5 x weekly - 1 sets - 10 reps - seated hamstring stretch  - 1 x daily - 5 x weekly - 1 sets - 3 reps - 30 seconds hold - Supine Transversus Abdominis Bracing - Hands on Ground  - 1 x daily - 5 x weekly - 1 sets - 10  reps - Hip and Knee Extension and Flexion Caregiver PROM  - 1 x daily - 5 x weekly - 1 sets - 10 reps - Supine Knee Extension Strengthening  - 1 x daily - 5 x weekly - 1 sets - 10 reps - Supine Lower Trunk Rotation  - 1 x daily - 5 x weekly - 1 sets - 5-6 reps - Supine Hip Adduction Isometric with Ball  - 1 x daily - 5 x weekly - 1 sets - 10 reps - 3 seconds hold  - Seated March  - 1 x daily - 7 x weekly - 3 sets - 10 reps - Seated Long Arc Quad  - 1 x daily - 7 x weekly - 3 sets - 10 reps - Seated Hip Abduction  - 1 x daily - 7 x weekly - 3 sets - 10 reps  GOALS  Goals reviewed with patient? Yes   SHORT TERM GOALS:   Target date: 10/29/2021  Pt will be compliant to HEP with caregiver assist for stretching and strengthening x4 days a week. Baseline: Intermittent compliance as CNA available, daughter working on more consistent help. Goal status: INITIAL  2.  Pt will be able to perform slideboard transfer after board placed mod assist for improved mobility. Baseline: MaxA Goal status: INITIAL  3.  Pt will stand in [place] w/ maxA of 1 in order to work on standing tolerance and BLE weight bearing. Baseline: MaxA of 2 w/ [place] Goal status: INITIAL  4.  Pt will perform sit<>supine modA to improve functional mobility at home. Baseline: MaxA Goal status: INITIAL  LONG TERM GOALS:  Target date: 11/26/2021  Pt will report decreased pain in right knee during functional transfers with sliding board in order to improve LE engagement to transfers and functional mobility. Baseline: 7/10 constant ache Goal status: INITIAL  2.  Pt will perform reach to floor from sitting and return to upright sitting supervision level to demonstrate improved functional balance for ADL performance. Baseline: Can reach forward and bilaterally outside BOS ~5 inches. Goal status: INITIAL  3.  Pt will tolerate standing in [place] w/o using seat x2 mins to improve weight bearing tolerance. Baseline: MaxA of 2 w/ [place],  relies on seat Goal status: INITIAL  4.  Pt will perform sliding board transfer after board placed minA in order to improve safety and functional independence with transfers with decreased caregiver assist. Baseline: MaxA Goal status: INITIAL  ASSESSMENT:   CLINICAL IMPRESSION: Focus of skilled session today on transitioning from progression of strength to functionality in home and preparing for discharge at end of cert.  Extensive discussion had with daughter and patient about realistic goals, functional plateau, and promotion of engagement at home via upright sitting during awake hours and further pt engagement in ADLs and transfers.  Discussed progression to sliding board with aide following daughter's discussion with aide after session.  Will continue to progress towards LTGs as able and STGs to be assessed at next visit.   OBJECTIVE IMPAIRMENTS Abnormal gait, decreased balance, decreased knowledge of use of DME, decreased mobility, decreased ROM, decreased strength, dizziness, increased edema, impaired UE functional use, and pain.    ACTIVITY LIMITATIONS cleaning, community activity, driving, meal prep, laundry, medication management, yard work, and shopping.    PERSONAL FACTORS Age, Time since onset of injury/illness/exacerbation, and 3+ comorbidities: hypertension, cervical DDD, bilateral DVTs admitted on 01/05/2021 for bilateral hand weakness, numbness and the left leg weakness  are also affecting patient's functional outcome.      REHAB POTENTIAL: Fair     CLINICAL DECISION MAKING: Evolving/moderate complexity   EVALUATION COMPLEXITY: Moderate   PLAN: PT FREQUENCY: 2x/week for 4 weeks followed by 1x/week for 4 weeks   PT DURATION: 8 weeks   PLANNED INTERVENTIONS: Therapeutic exercises, Therapeutic activity, Neuromuscular re-education, Balance training, Gait training, Patient/Family education, Joint mobilization, Vestibular training, Canalith repositioning, Orthotic/Fit training,  DME instructions, Wheelchair mobility training, Cryotherapy, Moist heat, and Manual therapy   PLAN FOR NEXT SESSION:   Did daughter ask primary aide about ability to perform sliding board transfers?  If aide feels comfortable with transfer and daughter feels confident in instruction of sliding board use (cues, etc.) then they can order sliding board out of pocket.  Assess STGs!  Weight shifting, practice multi-directional scooting EOM.  Continue multidirectional reaching and further assess trunk rotation in sitting.    Bed mobility. Is Hoyer lift transfer at this time-progressing to sliding board (continue to assess/work on). Sitting EOM.  If 2 people practice transition supine <>sit.  Continue to work on [place] strengthening in sitting and supine.  Address IR/ER of hip in supine, further stretching-especially Left ankle, glut/quad sets, bridging, heel slides-use tapping at tendon insertion, trunk rotations in sitting, anterior weight shifts   Elease Etienne, PT, DPT

## 2021-10-25 NOTE — Therapy (Signed)
OUTPATIENT OCCUPATIONAL THERAPY TREATMENT NOTE    Patient Name: [name] MRN: [id number] DOB:[birth year], 88 y.o., female Today's Date: 10/25/2021  PCP: Nolene Ebbs, MD  REFERRING PROVIDER: Nolene Ebbs, MD   END OF SESSION:   OT End of Session - 10/25/21 1322     Visit Number 16    Number of Visits 22    Date for OT Re-Evaluation 12/06/21    Authorization Type UHC Medicare and Medicaid,Covered 100% as long as she's enrolled with Medicaid  VL: Follows Medicare guidelines    OT Start Time 22    OT Stop Time 1400    OT Time Calculation (min) 40 min    Equipment Utilized During Treatment sliding board, sarah    Activity Tolerance Patient tolerated treatment well    Behavior During Therapy WFL for tasks assessed/performed              Past Medical History:  Diagnosis Date   Allergic rhinitis    Arthritis    Carpal tunnel syndrome    Deep venous thrombosis (HCC)    bilateral legs   Degenerative arthritis    right knee   Hypertension    Lumbar degenerative disc disease    Lump or mass in breast    Paresthesia    Syncope 05/24/2013   Vitamin D deficiency    Past Surgical History:  Procedure Laterality Date   ABDOMINAL HYSTERECTOMY     CARPAL TUNNEL RELEASE Right    CATARACT EXTRACTION Bilateral    HEMORROIDECTOMY     IVC Filter     TOTAL HIP ARTHROPLASTY Right    ULNAR NERVE TRANSPOSITION Right    Patient Active Problem List   Diagnosis Date Noted   Obesity (BMI 30-39.9) 01/28/2021   Acute CVA (cerebrovascular accident) ([place]) 01/25/2021   Acute kidney injury ([place]) 01/25/2021   CVA (cerebral vascular accident) ([place]) 01/06/2021   Essential hypertension 01/06/2021   Arthritis 01/06/2021   Pain due to onychomycosis of toenails of both feet 12/14/2018   Presbycusis of both ears 09/22/2015   Syncope 05/24/2013    ONSET DATE: 07/13/21 Referral date   REFERRING DIAG: Hemiplegia and hemiparesis following cerebral infarction affecting left  non-dominant side ([place])   THERAPY DIAG:  Hemiplegia and hemiparesis following cerebral infarction affecting left non-dominant side (HCC)  Muscle weakness (generalized)  Unsteadiness on feet  Other lack of coordination  Other disturbances of skin sensation  Attention and concentration deficit  Visuospatial deficit   PERTINENT HISTORY: vertigo, smoking, hypertension, cervical DDD, bilateral DVTs  multiple strokes (09/22, 10/22) .  right MCA territory scattered infarcts.  MRI C-spine showed C3/4 severe spinal stenosis and mild spinal cord compression.  EF 60 to 65%   PRECAUTIONS: fall  SUBJECTIVE: pt reports had a case of vertigo last week  PAIN:  Are you having pain? Yes NPRS scale: 0/10 at rest, fluctuates w/ movement Pain location: knees Pain orientation: Bilateral  PAIN TYPE: aching Pain description: intermittent  Aggravating factors: passive movement  Relieving factors: rest, injections      OBJECTIVE:   TODAY'S TREATMENT:  TODAY'S TREATMENT:  10/25/21   Discussed goals with patient and caregiver regarding anticipated progress and level of care anticipated at discharge.  Worked in postural work with leaning off back of w/c and reaching towards knees and floor with BUE for increasing shoulder range of motion and core/trunk strengthening for sitting against back of w/c.   Attempted pulling forward on steady with use of RUE for pulling up with cueing  for anterior lean with approaching standing.   HOME EXERCISE PROGRAM: 08/18/21:  Table slides BUE  09/08/21:  ADL - Donning/doffing pull over shirt   09/14/21: splint wear and care     GOALS: Goals reviewed with patient? Yes   SHORT TERM GOALS: Target date: 08/31/2021   Patient and caregiver will complete an HEP designed to improve active range of motion in LUE Baseline: Stretching program Goal status: Achieved   2.  Patient will demonstrate 30% composite flexion in digits Baseline: 20% then painful Goal  status: Achieved   3.  Patient will sit at edge of mat table with intermittent min assistance x 30 seconds in preparation for active participation in ADL Baseline: 3-5 sec  Goal status: Achieved   4.  Patient will lean forward while seated at edge of mat as if reaching toward shoes/socks - reach past knees 2/3 down length of calf Baseline: just below knee Goal status: Achieved   5.  Patient will wash her face, chest and left arm after set up assistance Baseline: Dependent Goal status: Achieved 10/04/21   LONG TERM GOALS: Target date: 12/06/21   Patient will complete home activity program designed to improve functional use of LUE in simple self care tasks Baseline: No HEP Goal status: Ongoing   2.  Patient will report pain no greater than 3/10 in LEFT shoulder with active reach Baseline: 6/10 Goal status: Ongoing   3.  Patient will complete an active level surface transfer with max assist Baseline: dependent- hoyer Goal status: Ongoing - inconsistent with SB transfers   4.  Patient will assist with pulling up and down lower body clothing to reduce caregiver burden for dressing Baseline: Dependent Goal status: Ongoing - pt reports assisting some but not consistently.    5.  Patient will demonstrate at least 50% composite flexion in left hand to aide with grasp/release Baseline: 20% flex Goal status: Ongoing - approx 20% active flexion       ASSESSMENT:   CLINICAL IMPRESSION:   Patient's daughter seemed more overwhelmed today and reports falling.  PERFORMANCE DEFICITS in functional skills including ADLs, IADLs, coordination, dexterity, proprioception, sensation, edema, tone, ROM, strength, pain, fascial restrictions, muscle spasms, flexibility, FMC, GMC, mobility, balance, body mechanics, endurance, cardiopulmonary status limiting function, decreased knowledge of use of DME, UE functional use, and vestibular, cognitive skills including attention, energy/drive, memory,  orientation, perception, problem solving, safety awareness, and sequencing, and psychosocial skills including environmental adaptation and habits.    IMPAIRMENTS are limiting patient from ADLs, IADLs, rest and sleep, leisure, and social participation.    COMORBIDITIES may have co-morbidities  that affects occupational performance. Patient will benefit from skilled OT to address above impairments and improve overall function.   MODIFICATION OR ASSISTANCE TO COMPLETE EVALUATION: Min-Moderate modification of tasks or assist with assess necessary to complete an evaluation.   OT OCCUPATIONAL PROFILE AND HISTORY: Detailed assessment: Review of records and additional review of physical, cognitive, psychosocial history related to current functional performance.   CLINICAL DECISION MAKING: Moderate - several treatment options, min-mod task modification necessary   REHAB POTENTIAL: Good   EVALUATION COMPLEXITY: Moderate     PLAN: OT FREQUENCY: 1x/week   OT DURATION: 10 weeks 9 additional visits at renewal over 10 weeks   PLANNED INTERVENTIONS: self care/ADL training, therapeutic exercise, therapeutic activity, neuromuscular re-education, manual therapy, passive range of motion, balance training, functional mobility training, splinting, electrical stimulation, ultrasound, paraffin, fluidotherapy, moist heat, cryotherapy, patient/family education, cognitive remediation/compensation, visual/perceptual remediation/compensation, energy conservation, coping  strategies training, and DME and/or AE instructions   RECOMMENDED OTHER SERVICES: Receiving PT   CONSULTED AND AGREED WITH PLAN OF CARE: Patient and family member/caregiver   PLAN FOR NEXT SESSION:   Upper body dressing, NMR LUE, Postural control seated, weight shift forward, midline orientation      Zachery Conch, OT 10/25/2021, 3:41 PM

## 2021-10-27 ENCOUNTER — Ambulatory Visit: Payer: Medicare Other | Admitting: Physical Therapy

## 2021-10-29 ENCOUNTER — Ambulatory Visit (INDEPENDENT_AMBULATORY_CARE_PROVIDER_SITE_OTHER): Payer: Medicare Other | Admitting: Podiatry

## 2021-10-29 ENCOUNTER — Encounter: Payer: Self-pay | Admitting: Podiatry

## 2021-10-29 DIAGNOSIS — M79674 Pain in right toe(s): Secondary | ICD-10-CM | POA: Diagnosis not present

## 2021-10-29 DIAGNOSIS — B351 Tinea unguium: Secondary | ICD-10-CM | POA: Diagnosis not present

## 2021-10-29 DIAGNOSIS — M79675 Pain in left toe(s): Secondary | ICD-10-CM | POA: Diagnosis not present

## 2021-10-29 DIAGNOSIS — N179 Acute kidney failure, unspecified: Secondary | ICD-10-CM | POA: Diagnosis not present

## 2021-10-29 NOTE — Progress Notes (Addendum)
This patient returns to my office for at risk foot care.  This patient requires this care by a professional since this patient will be at risk due to having neuropathy.  This patient is unable to cut nails herself since the patient cannot reach her nails.These nails are painful walking and wearing shoes. She presents to the office with her daughter in a wheelchair. This patient presents for at risk foot care today.  General Appearance  Alert, conversant and in no acute stress.  Vascular  Dorsalis pedis and posterior tibial  pulses are weakly  palpable  bilaterally.  Capillary return is within normal limits  bilaterally. Temperature is within normal limits  bilaterally.  Neurologic  Senn-Weinstein monofilament wire test within normal limits  bilaterally. Muscle power within normal limits bilaterally.  Nails Thick disfigured discolored nails with subungual debris  from hallux to fifth toes bilaterally. No evidence of bacterial infection or drainage bilaterally.  Orthopedic  No limitations of motion  feet .  No crepitus or effusions noted.  No bony pathology or digital deformities noted.  Skin  normotropic skin with no porokeratosis noted bilaterally.  No signs of infections or ulcers noted.     Onychomycosis  Pain in right toes  Pain in left toes  Consent was obtained for treatment procedures.   Mechanical debridement of nails 1-5  bilaterally performed with a nail nipper.  Filed with dremel without incident.    Return office visit   3 months                   Told patient to return for periodic foot care and evaluation due to potential at risk complications.   Gardiner Barefoot DPM

## 2021-11-01 ENCOUNTER — Encounter: Payer: Self-pay | Admitting: Occupational Therapy

## 2021-11-01 ENCOUNTER — Encounter: Payer: Self-pay | Admitting: Physical Therapy

## 2021-11-01 ENCOUNTER — Other Ambulatory Visit: Payer: Self-pay

## 2021-11-01 ENCOUNTER — Ambulatory Visit: Payer: Medicare Other | Admitting: Occupational Therapy

## 2021-11-01 ENCOUNTER — Ambulatory Visit: Payer: Medicare Other | Admitting: Physical Therapy

## 2021-11-01 DIAGNOSIS — R208 Other disturbances of skin sensation: Secondary | ICD-10-CM

## 2021-11-01 DIAGNOSIS — R2681 Unsteadiness on feet: Secondary | ICD-10-CM

## 2021-11-01 DIAGNOSIS — R41840 Attention and concentration deficit: Secondary | ICD-10-CM

## 2021-11-01 DIAGNOSIS — I69354 Hemiplegia and hemiparesis following cerebral infarction affecting left non-dominant side: Secondary | ICD-10-CM

## 2021-11-01 DIAGNOSIS — M6281 Muscle weakness (generalized): Secondary | ICD-10-CM | POA: Diagnosis not present

## 2021-11-01 DIAGNOSIS — R278 Other lack of coordination: Secondary | ICD-10-CM

## 2021-11-01 DIAGNOSIS — M1711 Unilateral primary osteoarthritis, right knee: Secondary | ICD-10-CM

## 2021-11-01 DIAGNOSIS — M1712 Unilateral primary osteoarthritis, left knee: Secondary | ICD-10-CM

## 2021-11-01 DIAGNOSIS — R41842 Visuospatial deficit: Secondary | ICD-10-CM

## 2021-11-01 NOTE — Therapy (Signed)
OUTPATIENT OCCUPATIONAL THERAPY TREATMENT NOTE    Patient Name: [name] MRN: [id number] DOB:[birth year], 88 y.o., female Today's Date: 11/01/2021  PCP: Nolene Ebbs, MD  REFERRING PROVIDER: Nolene Ebbs, MD   END OF SESSION:   OT End of Session - 11/01/21 1320     Visit Number 17    Number of Visits 22    Date for OT Re-Evaluation 12/06/21    Authorization Type UHC Medicare and Medicaid,Covered 100% as long as she's enrolled with Medicaid  VL: Follows Medicare guidelines    OT Start Time 70    OT Stop Time 1400    OT Time Calculation (min) 40 min    Equipment Utilized During Treatment sliding board, sarah    Activity Tolerance Patient tolerated treatment well    Behavior During Therapy WFL for tasks assessed/performed              Past Medical History:  Diagnosis Date   Allergic rhinitis    Arthritis    Carpal tunnel syndrome    Deep venous thrombosis (HCC)    bilateral legs   Degenerative arthritis    right knee   Hypertension    Lumbar degenerative disc disease    Lump or mass in breast    Paresthesia    Syncope 05/24/2013   Vitamin D deficiency    Past Surgical History:  Procedure Laterality Date   ABDOMINAL HYSTERECTOMY     CARPAL TUNNEL RELEASE Right    CATARACT EXTRACTION Bilateral    HEMORROIDECTOMY     IVC Filter     TOTAL HIP ARTHROPLASTY Right    ULNAR NERVE TRANSPOSITION Right    Patient Active Problem List   Diagnosis Date Noted   Obesity (BMI 30-39.9) 01/28/2021   Acute CVA (cerebrovascular accident) ([employer]) 01/25/2021   Acute kidney injury ([place]) 01/25/2021   CVA (cerebral vascular accident) ([place]) 01/06/2021   Essential hypertension 01/06/2021   Arthritis 01/06/2021   Pain due to onychomycosis of toenails of both feet 12/14/2018   Presbycusis of both ears 09/22/2015   Syncope 05/24/2013    ONSET DATE: 07/13/21 Referral date   REFERRING DIAG: Hemiplegia and hemiparesis following cerebral infarction affecting left  non-dominant side ([place])   THERAPY DIAG:  Hemiplegia and hemiparesis following cerebral infarction affecting left non-dominant side (HCC)  Muscle weakness (generalized)  Unsteadiness on feet  Other lack of coordination  Other disturbances of skin sensation  Attention and concentration deficit  Visuospatial deficit   PERTINENT HISTORY: vertigo, smoking, hypertension, cervical DDD, bilateral DVTs  multiple strokes (09/22, 10/22) .  right MCA territory scattered infarcts.  MRI C-spine showed C3/4 severe spinal stenosis and mild spinal cord compression.  EF 60 to 65%   PRECAUTIONS: fall  SUBJECTIVE: "this leg be hurting me"  PAIN:  Are you having pain? Yes NPRS scale: 8/10 at rest, fluctuates w/ movement Pain location: knees Pain orientation: Bilateral  PAIN TYPE: aching Pain description: intermittent  Aggravating factors: passive movement  Relieving factors: rest, injections      OBJECTIVE:   TODAY'S TREATMENT:  TODAY'S TREATMENT:  11/01/21  Pt reports pain in RLE knee and distally. UB Dressing pt able to doff shirt overhead with min A from w/c level and donned shirt with mod A Sliding Board transfer with mod A to left with assistance for set up and lift assistance. X 2 therapist for management of sliding board Manual Therapy to LUE wrist and carpals and hand/digits. Sitting Balance much improved as patient with minimal to none  posterior and lateral lean today.     HOME EXERCISE PROGRAM: 08/18/21:  Table slides BUE  09/08/21:  ADL - Donning/doffing pull over shirt   09/14/21: splint wear and care     GOALS: Goals reviewed with patient? Yes   SHORT TERM GOALS: Target date: 08/31/2021   Patient and caregiver will complete an HEP designed to improve active range of motion in LUE Baseline: Stretching program Goal status: Achieved   2.  Patient will demonstrate 30% composite flexion in digits Baseline: 20% then painful Goal status: Achieved   3.  Patient  will sit at edge of mat table with intermittent min assistance x 30 seconds in preparation for active participation in ADL Baseline: 3-5 sec  Goal status: Achieved   4.  Patient will lean forward while seated at edge of mat as if reaching toward shoes/socks - reach past knees 2/3 down length of calf Baseline: just below knee Goal status: Achieved   5.  Patient will wash her face, chest and left arm after set up assistance Baseline: Dependent Goal status: Achieved 10/04/21   LONG TERM GOALS: Target date: 12/06/21   Patient will complete home activity program designed to improve functional use of LUE in simple self care tasks Baseline: No HEP Goal status: Ongoing   2.  Patient will report pain no greater than 3/10 in LEFT shoulder with active reach Baseline: 6/10 Goal status: Ongoing   3.  Patient will complete an active level surface transfer with max assist Baseline: dependent- hoyer Goal status: Ongoing - inconsistent with SB transfers   4.  Patient will assist with pulling up and down lower body clothing to reduce caregiver burden for dressing Baseline: Dependent Goal status: Ongoing - pt reports assisting some but not consistently.    5.  Patient will demonstrate at least 50% composite flexion in left hand to aide with grasp/release Baseline: 20% flex Goal status: Ongoing - approx 20% active flexion       ASSESSMENT:   CLINICAL IMPRESSION:   Patient's sliding board transfer with less assistance req'd today. Sitting balance much improved.  PERFORMANCE DEFICITS in functional skills including ADLs, IADLs, coordination, dexterity, proprioception, sensation, edema, tone, ROM, strength, pain, fascial restrictions, muscle spasms, flexibility, FMC, GMC, mobility, balance, body mechanics, endurance, cardiopulmonary status limiting function, decreased knowledge of use of DME, UE functional use, and vestibular, cognitive skills including attention, energy/drive, memory, orientation,  perception, problem solving, safety awareness, and sequencing, and psychosocial skills including environmental adaptation and habits.    IMPAIRMENTS are limiting patient from ADLs, IADLs, rest and sleep, leisure, and social participation.    COMORBIDITIES may have co-morbidities  that affects occupational performance. Patient will benefit from skilled OT to address above impairments and improve overall function.   MODIFICATION OR ASSISTANCE TO COMPLETE EVALUATION: Min-Moderate modification of tasks or assist with assess necessary to complete an evaluation.   OT OCCUPATIONAL PROFILE AND HISTORY: Detailed assessment: Review of records and additional review of physical, cognitive, psychosocial history related to current functional performance.   CLINICAL DECISION MAKING: Moderate - several treatment options, min-mod task modification necessary   REHAB POTENTIAL: Good   EVALUATION COMPLEXITY: Moderate     PLAN: OT FREQUENCY: 1x/week   OT DURATION: 10 weeks 9 additional visits at renewal over 10 weeks   PLANNED INTERVENTIONS: self care/ADL training, therapeutic exercise, therapeutic activity, neuromuscular re-education, manual therapy, passive range of motion, balance training, functional mobility training, splinting, electrical stimulation, ultrasound, paraffin, fluidotherapy, moist heat, cryotherapy, patient/family education, cognitive remediation/compensation,  visual/perceptual remediation/compensation, energy conservation, coping strategies training, and DME and/or AE instructions   RECOMMENDED OTHER SERVICES: Receiving PT   CONSULTED AND AGREED WITH PLAN OF CARE: Patient and family member/caregiver   PLAN FOR NEXT SESSION:   Upper body dressing, NMR LUE, Postural control seated, weight shift forward, midline orientation      Zachery Conch, OT 11/01/2021, 3:31 PM

## 2021-11-01 NOTE — Therapy (Signed)
OUTPATIENT PHYSICAL THERAPY TREATMENT NOTE   Patient Name: [name] MRN: [id number] DOB:[birth year], 88 y.o., female Today's Date: 11/01/2021  PCP: Fleet Contras, MD  REFERRING PROVIDER: Fleet Contras, MD  END OF SESSION:   PT End of Session - 11/01/21 1404     Visit Number 18    Number of Visits 29    Date for PT Re-Evaluation 11/26/21    Authorization Type UHC medicare so 10th visit progress note    Progress Note Due on Visit 20    PT Start Time 1401    PT Stop Time 1450    PT Time Calculation (min) 49 min    Equipment Utilized During Treatment Gait belt    Activity Tolerance Patient tolerated treatment well    Behavior During Therapy WFL for tasks assessed/performed                Past Medical History:  Diagnosis Date   Allergic rhinitis    Arthritis    Carpal tunnel syndrome    Deep venous thrombosis (HCC)    bilateral legs   Degenerative arthritis    right knee   Hypertension    Lumbar degenerative disc disease    Lump or mass in breast    Paresthesia    Syncope 05/24/2013   Vitamin D deficiency    Past Surgical History:  Procedure Laterality Date   ABDOMINAL HYSTERECTOMY     CARPAL TUNNEL RELEASE Right    CATARACT EXTRACTION Bilateral    HEMORROIDECTOMY     IVC Filter     TOTAL HIP ARTHROPLASTY Right    ULNAR NERVE TRANSPOSITION Right    Patient Active Problem List   Diagnosis Date Noted   Obesity (BMI 30-39.9) 01/28/2021   Acute CVA (cerebrovascular accident) (HCC) 01/25/2021   Acute kidney injury (HCC) 01/25/2021   CVA (cerebral vascular accident) (HCC) 01/06/2021   Essential hypertension 01/06/2021   Arthritis 01/06/2021   Pain due to onychomycosis of toenails of both feet 12/14/2018   Presbycusis of both ears 09/22/2015   Syncope 05/24/2013    REFERRING DIAG: I63.9 (ICD-10-CM) - CVA (cerebral vascular accident)    THERAPY DIAG:  Hemiplegia and hemiparesis following cerebral infarction affecting left non-dominant side  (HCC)  Muscle weakness (generalized)  Unsteadiness on feet  Other lack of coordination  PERTINENT HISTORY: 88 year old female with history of smoking, hypertension, cervical DDD, bilateral DVTs admitted on 01/05/2021 for bilateral hand weakness, numbness and the left leg weakness.    PRECAUTIONS: Fall   SUBJECTIVE: Patient states she slept well and has not had much problem with that lately.  She denies falls during transfers or other transfer-related issues.  Nothing new at home.  PAIN:  Are you having pain? Yes: NPRS scale: 8/10 Pain location: Right knee Pain description: OA related, radiates into foot Aggravating factors: weight-bearing Relieving factors: voltaren mildly helped  TODAY'S TREATMENT: Self-care and home management: Brief discussion with patient and daughter regarding purchase of sliding board and requirement of daughter to be behind patient during transfer with aide providing force as daughter reiterates that aide has voiced comfort with this type of transfer.  Reiterated daughter's role in helping maintain patient's forward lean and allowing aide to provide the sliding force as needed.  Daughter states confidence in aide performing transfer.  Discussed using best judgement based on patient fatigue (used inc posterolateral lean as example of fatigue) to judge when to allow pt to transfer via hoyer vs sliding board.  Further discussed using gravity assist with  downhill sliding board transfers as able with brief demo.  Daughter did not want printed purchase resources as she is going to call the medical supply company.  TA/Goal Assessment: Pt performs sit<>supine w/ TotalA of 1.  Pt demonstrates decreased engagement to task even when cued in later part of transfer to further assess performance, she required inc assistance for trunk and LE management endorsing pain in R knee with movement.  Performs scooting w/ maxA to EOM on return to sitting w/ pt demonstrating inc posterior  lean and dec trunk control.  Frequent cuing for anterior weight shifting and use of RUE to lean forward to maintain sitting balance.  PT provides modA several times to get patient forward with pt having difficulty maintaining so regressed to sitting EOM w/ ball support behind trunk x6 mins.  Rest mildly improved trunk control and engagement to anterior weight shifting.  Attempted standing with manual Stedy using right dycem grip to improve functional engagement.  Pt does not demonstrate adequate LE engagement to task and does not maintain forward lean w/o therapist facilitation.  TotalA +2 for initiation without clearance of bottom from mat and pt endorsing right knee pain.  Attempted x2.  Inc time provided with cuing to have pt lean for sliding board placement as pt does not initiate assistance with task without cuing.  TotalA for sliding board placement with maxA for initiation of sliding board transfer to the right, pt uses RUE to pull from edge of board without much success.  PT places RUE on armrest with improved engagement to task and modA for middle part of transfer.  MaxA for scoot when in w/c and removal of sliding board with pt demonstrating improved initiation of right lean to offweight.  PATIENT EDUCATION: Education details: Continue HEP.  See self-care section. Person educated: Patient and Child(ren) Education method: Explanation and Demonstration Education comprehension: verbalized understanding and needs further education      HOME EXERCISE PROGRAM:  Access Code: [email or web address] URL: [email or web address] Date: 08/18/2021 Prepared by: Sallyanne Kuster  Exercises - Seated Heel Raise  - 1 x daily - 5 x weekly - 1 sets - 10 reps - seated hamstring stretch  - 1 x daily - 5 x weekly - 1 sets - 3 reps - 30 seconds hold - Supine Transversus Abdominis Bracing - Hands on Ground  - 1 x daily - 5 x weekly - 1 sets - 10 reps - Hip and Knee Extension and Flexion Caregiver PROM  - 1 x daily  - 5 x weekly - 1 sets - 10 reps - Supine Knee Extension Strengthening  - 1 x daily - 5 x weekly - 1 sets - 10 reps - Supine Lower Trunk Rotation  - 1 x daily - 5 x weekly - 1 sets - 5-6 reps - Supine Hip Adduction Isometric with Ball  - 1 x daily - 5 x weekly - 1 sets - 10 reps - 3 seconds hold  - Seated March  - 1 x daily - 7 x weekly - 3 sets - 10 reps - Seated Long Arc Quad  - 1 x daily - 7 x weekly - 3 sets - 10 reps - Seated Hip Abduction  - 1 x daily - 7 x weekly - 3 sets - 10 reps  GOALS  Goals reviewed with patient? Yes   SHORT TERM GOALS:   Target date: 10/29/2021  Pt will be compliant to HEP with caregiver assist for stretching and strengthening x4 days a week. Baseline:  Intermittent compliance as CNA available, daughter working on more consistent help; daughter states new aide is working on HEP more frequently with patient. Goal status: MET  2.  Pt will be able to perform slideboard transfer after board placed mod assist for improved mobility. Baseline: MaxA; 11/01/2021 mod-maxA Goal status: NOT MET  3.  Pt will stand in [place] w/ maxA of 1 in order to work on standing tolerance and BLE weight bearing. Baseline: MaxA of 2 w/ Stedy; TotalA of 2 w/ Stedy Goal status: NOT MET  4.  Pt will perform sit<>supine modA to improve functional mobility at home. Baseline: MaxA; 11/01/2021 TotalA of 1 Goal status: NOT MET  LONG TERM GOALS:  Target date: 11/26/2021  Pt will report decreased pain in right knee during functional transfers with sliding board in order to improve LE engagement to transfers and functional mobility. Baseline: 7/10 constant ache Goal status: INITIAL  2.  Pt will perform reach to floor from sitting and return to upright sitting supervision level to demonstrate improved functional balance for ADL performance. Baseline: Can reach forward and bilaterally outside BOS ~5 inches. Goal status: INITIAL  3.  Pt will tolerate standing in [place] x2 mins to improve  weight bearing tolerance. Baseline: MaxA of 2 w/ manual Stedy, relies on seat Goal status: REVISED  4.  Pt will perform sliding board transfer after board placed modA in order to improve safety and functional independence with transfers with decreased caregiver assist. Baseline: MaxA Goal status: REVISED  ASSESSMENT:   CLINICAL IMPRESSION: Assessment of STGs today with patient not meeting 3 of 4 goals.  Patient is reaching a functional plateau and will be appropriate for continued care at home with aide and daughter following current episode of outpatient care.  Provided instructions on how to purchase sliding board for aide to trial transfers at home when appropriate w/ daughter's assistance as able.  Pt required total assistance for sit<>supine and standing attempts with Stedy using +2 assistance.  She is able to perform parts of sliding board transfer to right w/ modA, but requires greater assist to initiate transfer.  She does best with frequent cuing to promote engagement to task and maintain proper posture for safety with transfer tasks.  Will continue progressing towards LTGs, revised today, as able.      OBJECTIVE IMPAIRMENTS Abnormal gait, decreased balance, decreased knowledge of use of DME, decreased mobility, decreased ROM, decreased strength, dizziness, increased edema, impaired UE functional use, and pain.    ACTIVITY LIMITATIONS cleaning, community activity, driving, meal prep, laundry, medication management, yard work, and shopping.    PERSONAL FACTORS Age, Time since onset of injury/illness/exacerbation, and 3+ comorbidities: hypertension, cervical DDD, bilateral DVTs admitted on 01/05/2021 for bilateral hand weakness, numbness and the left leg weakness  are also affecting patient's functional outcome.      REHAB POTENTIAL: Fair     CLINICAL DECISION MAKING: Evolving/moderate complexity   EVALUATION COMPLEXITY: Moderate   PLAN: PT FREQUENCY: 2x/week for 4 weeks followed by  1x/week for 4 weeks   PT DURATION: 8 weeks   PLANNED INTERVENTIONS: Therapeutic exercises, Therapeutic activity, Neuromuscular re-education, Balance training, Gait training, Patient/Family education, Joint mobilization, Vestibular training, Canalith repositioning, Orthotic/Fit training, DME instructions, Wheelchair mobility training, Cryotherapy, Moist heat, and Manual therapy   PLAN FOR NEXT SESSION:    Weight shifting, practice multi-directional scooting EOM.  Continue multidirectional reaching and further assess trunk rotation in sitting.    Bed mobility. Is Hoyer lift transfer at this time-progressing to  sliding board (continue to assess/work on). Sitting EOM.  Supine <>sit.  Continue to work on LE/core strengthening in sitting and supine.  Address IR/ER of hip in supine, further stretching-especially Left ankle, glut/quad sets, bridging, heel slides-use tapping at tendon insertion, trunk rotations in sitting, anterior weight shifts   Camille Bal, PT, DPT

## 2021-11-03 ENCOUNTER — Ambulatory Visit: Payer: Medicare Other

## 2021-11-03 DIAGNOSIS — R2681 Unsteadiness on feet: Secondary | ICD-10-CM

## 2021-11-03 DIAGNOSIS — M6281 Muscle weakness (generalized): Secondary | ICD-10-CM

## 2021-11-03 DIAGNOSIS — R278 Other lack of coordination: Secondary | ICD-10-CM

## 2021-11-03 NOTE — Therapy (Signed)
OUTPATIENT PHYSICAL THERAPY TREATMENT NOTE   Patient Name: [name] MRN: [id number] DOB:1928/07/24, 88 y.o., female Today's Date: 11/03/2021  PCP: Nolene Ebbs, MD  REFERRING PROVIDER: Nolene Ebbs, MD  END OF SESSION:   PT End of Session - 11/03/21 1404     Visit Number 19    Number of Visits 29    Date for PT Re-Evaluation 11/26/21    Authorization Type [employer] medicare so 10th visit progress note    Progress Note Due on Visit 20    PT Start Time 1401    PT Stop Time 1445    PT Time Calculation (min) 44 min    Activity Tolerance Patient tolerated treatment well    Behavior During Therapy WFL for tasks assessed/performed              Past Medical History:  Diagnosis Date   Allergic rhinitis    Arthritis    Carpal tunnel syndrome    Deep venous thrombosis (HCC)    bilateral legs   Degenerative arthritis    right knee   Hypertension    Lumbar degenerative disc disease    Lump or mass in breast    Paresthesia    Syncope 05/24/2013   Vitamin D deficiency    Past Surgical History:  Procedure Laterality Date   ABDOMINAL HYSTERECTOMY     CARPAL TUNNEL RELEASE Right    CATARACT EXTRACTION Bilateral    HEMORROIDECTOMY     IVC Filter     TOTAL HIP ARTHROPLASTY Right    ULNAR NERVE TRANSPOSITION Right    Patient Active Problem List   Diagnosis Date Noted   Obesity (BMI 30-39.9) 01/28/2021   Acute CVA (cerebrovascular accident) ([place]) 01/25/2021   Acute kidney injury ([place]) 01/25/2021   CVA (cerebral vascular accident) ([place]) 01/06/2021   Essential hypertension 01/06/2021   Arthritis 01/06/2021   Pain due to onychomycosis of toenails of both feet 12/14/2018   Presbycusis of both ears 09/22/2015   Syncope 05/24/2013    REFERRING DIAG: I63.9 (ICD-10-CM) - CVA (cerebral vascular accident)    THERAPY DIAG:  Muscle weakness (generalized)  Unsteadiness on feet  Other lack of coordination  PERTINENT HISTORY: 88 year old female with history of smoking,  hypertension, cervical DDD, bilateral DVTs admitted on 01/05/2021 for bilateral hand weakness, numbness and the left leg weakness.    PRECAUTIONS: Fall   SUBJECTIVE: Patient reports doing well. No falls at home. Has not purchased slideboard yet. Dtr reports that day aide is comfortable doing SBT, but reports that it's a "2 person operation" and dtr is comfortable providing assist despite continued orthopedic issues. Dtr states that patient spends ~1 hour on the [facility] to complete her crossword puzzle and exercises  PAIN:  Are you having pain? Yes: NPRS scale: 8/10 Pain location: Right knee Pain description: OA related, radiates into foot Aggravating factors: weight-bearing Relieving factors: voltaren mildly helped  TODAY'S TREATMENT: NMR:  -ant ball roll out for low back stretch/anterior weight shift   -trunk control reaching outside BOS with R UE  -LAQ -> 2# ankle weight 2x12  -scifit x10 mins, B LE, R UE  PATIENT EDUCATION: Education details: Continue HEP.  See self-care section. Person educated: Patient and Child(ren) Education method: Explanation and Demonstration Education comprehension: verbalized understanding and needs further education      HOME EXERCISE PROGRAM:  Access Code: [email or web address] URL: [email or web address] Date: 08/18/2021 Prepared by: Willow Ora  Exercises - Seated Heel Raise  - 1 x daily - 5 x weekly -  1 sets - 10 reps - seated hamstring stretch  - 1 x daily - 5 x weekly - 1 sets - 3 reps - 30 seconds hold - Supine Transversus Abdominis Bracing - Hands on Ground  - 1 x daily - 5 x weekly - 1 sets - 10 reps - Hip and Knee Extension and Flexion Caregiver PROM  - 1 x daily - 5 x weekly - 1 sets - 10 reps - Supine Knee Extension Strengthening  - 1 x daily - 5 x weekly - 1 sets - 10 reps - Supine Lower Trunk Rotation  - 1 x daily - 5 x weekly - 1 sets - 5-6 reps - Supine Hip Adduction Isometric with Ball  - 1 x daily - 5 x weekly - 1 sets - 10 reps - 3  seconds hold  - Seated March  - 1 x daily - 7 x weekly - 3 sets - 10 reps - Seated Long Arc Quad  - 1 x daily - 7 x weekly - 3 sets - 10 reps - Seated Hip Abduction  - 1 x daily - 7 x weekly - 3 sets - 10 reps  GOALS  Goals reviewed with patient? Yes   SHORT TERM GOALS:   Target date: 10/29/2021  Pt will be compliant to HEP with caregiver assist for stretching and strengthening x4 days a week. Baseline: Intermittent compliance as CNA available, daughter working on more consistent help; daughter states new aide is working on HEP more frequently with patient. Goal status: MET  2.  Pt will be able to perform slideboard transfer after board placed mod assist for improved mobility. Baseline: MaxA; 11/01/2021 mod-maxA Goal status: NOT MET  3.  Pt will stand in [place] w/ maxA of 1 in order to work on standing tolerance and BLE weight bearing. Baseline: MaxA of 2 w/ Stedy; TotalA of 2 w/ Stedy Goal status: NOT MET  4.  Pt will perform sit<>supine modA to improve functional mobility at home. Baseline: MaxA; 11/01/2021 TotalA of 1 Goal status: NOT MET  LONG TERM GOALS:  Target date: 11/26/2021  Pt will report decreased pain in right knee during functional transfers with sliding board in order to improve LE engagement to transfers and functional mobility. Baseline: 7/10 constant ache Goal status: INITIAL  2.  Pt will perform reach to floor from sitting and return to upright sitting supervision level to demonstrate improved functional balance for ADL performance. Baseline: Can reach forward and bilaterally outside BOS ~5 inches. Goal status: INITIAL  3.  Pt will tolerate standing in [place] x2 mins to improve weight bearing tolerance. Baseline: MaxA of 2 w/ manual Stedy, relies on seat Goal status: REVISED  4.  Pt will perform sliding board transfer after board placed modA in order to improve safety and functional independence with transfers with decreased caregiver assist. Baseline:  MaxA Goal status: REVISED  ASSESSMENT:   CLINICAL IMPRESSION: Patient seen for skilled PT session with emphasis on functional strengthening and anterior weight shifting in preparation for future transfers. She remains with difficulty leaning forward noting increased tone in low back + fear of falling. Used ball to facilitate weight bearing through L UE as well. She demonstrated improved AROM in L LE able to kick ball set at target and progressed to ~50% AROM with 2# ankle weight on. Continue POC.    OBJECTIVE IMPAIRMENTS Abnormal gait, decreased balance, decreased knowledge of use of DME, decreased mobility, decreased ROM, decreased strength, dizziness, increased edema, impaired  UE functional use, and pain.    ACTIVITY LIMITATIONS cleaning, community activity, driving, meal prep, laundry, medication management, yard work, and shopping.    PERSONAL FACTORS Age, Time since onset of injury/illness/exacerbation, and 3+ comorbidities: hypertension, cervical DDD, bilateral DVTs admitted on 01/05/2021 for bilateral hand weakness, numbness and the left leg weakness  are also affecting patient's functional outcome.      REHAB POTENTIAL: Fair     CLINICAL DECISION MAKING: Evolving/moderate complexity   EVALUATION COMPLEXITY: Moderate   PLAN: PT FREQUENCY: 2x/week for 4 weeks followed by 1x/week for 4 weeks   PT DURATION: 8 weeks   PLANNED INTERVENTIONS: Therapeutic exercises, Therapeutic activity, Neuromuscular re-education, Balance training, Gait training, Patient/Family education, Joint mobilization, Vestibular training, Canalith repositioning, Orthotic/Fit training, DME instructions, Wheelchair mobility training, Cryotherapy, Moist heat, and Manual therapy   PLAN FOR NEXT SESSION:    Weight shifting, practice multi-directional scooting EOM.  Continue multidirectional reaching and further assess trunk rotation in sitting.    Bed mobility. Is Hoyer lift transfer at this time-progressing to sliding  board (continue to assess/work on). Sitting EOM.  Supine <>sit.  Continue to work on [place] strengthening in sitting and supine.  Address IR/ER of hip in supine, further stretching-especially Left ankle, glut/quad sets, bridging, heel slides-use tapping at tendon insertion, trunk rotations in sitting, anterior weight shifts  Debbora Dus, PT, DPT, CBIS

## 2021-11-08 ENCOUNTER — Ambulatory Visit: Payer: Medicare Other | Admitting: Physical Therapy

## 2021-11-08 ENCOUNTER — Encounter: Payer: Self-pay | Admitting: Physical Therapy

## 2021-11-08 DIAGNOSIS — R2689 Other abnormalities of gait and mobility: Secondary | ICD-10-CM

## 2021-11-08 DIAGNOSIS — I69354 Hemiplegia and hemiparesis following cerebral infarction affecting left non-dominant side: Secondary | ICD-10-CM

## 2021-11-08 DIAGNOSIS — M6281 Muscle weakness (generalized): Secondary | ICD-10-CM | POA: Diagnosis not present

## 2021-11-08 NOTE — Therapy (Signed)
OUTPATIENT PHYSICAL THERAPY TREATMENT NOTE   Patient Name: [name] MRN: [id number] DOB:1928-06-29, 88 y.o., female Today's Date: 11/08/2021  PCP: Nolene Ebbs, MD  REFERRING PROVIDER: Nolene Ebbs, MD  PT progress note for [name].  Reporting period 09/27/2021 to 11/08/2021  See Note below for Objective Data and Assessment of Progress/Goals  Thank you for the referral of this patient. Elease Etienne, PT, DPT   END OF SESSION:   PT End of Session - 11/08/21 1421     Visit Number 20    Number of Visits 29    Date for PT Re-Evaluation 11/26/21    Authorization Type [employer] medicare so 10th visit progress note    Progress Note Due on Visit 20    PT Start Time 1403    PT Stop Time 1454    PT Time Calculation (min) 51 min    Activity Tolerance Patient tolerated treatment well    Behavior During Therapy WFL for tasks assessed/performed              Past Medical History:  Diagnosis Date   Allergic rhinitis    Arthritis    Carpal tunnel syndrome    Deep venous thrombosis (HCC)    bilateral legs   Degenerative arthritis    right knee   Hypertension    Lumbar degenerative disc disease    Lump or mass in breast    Paresthesia    Syncope 05/24/2013   Vitamin D deficiency    Past Surgical History:  Procedure Laterality Date   ABDOMINAL HYSTERECTOMY     CARPAL TUNNEL RELEASE Right    CATARACT EXTRACTION Bilateral    HEMORROIDECTOMY     IVC Filter     TOTAL HIP ARTHROPLASTY Right    ULNAR NERVE TRANSPOSITION Right    Patient Active Problem List   Diagnosis Date Noted   Obesity (BMI 30-39.9) 01/28/2021   Acute CVA (cerebrovascular accident) ([place]) 01/25/2021   Acute kidney injury ([place]) 01/25/2021   CVA (cerebral vascular accident) ([place]) 01/06/2021   Essential hypertension 01/06/2021   Arthritis 01/06/2021   Pain due to onychomycosis of toenails of both feet 12/14/2018   Presbycusis of both ears 09/22/2015   Syncope 05/24/2013     REFERRING DIAG: I63.9 (ICD-10-CM) - CVA (cerebral vascular accident)    THERAPY DIAG:  Muscle weakness (generalized)  Hemiplegia and hemiparesis following cerebral infarction affecting left non-dominant side (HCC)  Other abnormalities of gait and mobility  PERTINENT HISTORY: 88 year old female with history of smoking, hypertension, cervical DDD, bilateral DVTs admitted on 01/05/2021 for bilateral hand weakness, numbness and the left leg weakness.    PRECAUTIONS: Fall   SUBJECTIVE: Patient reports nothing new, no other changes.  PAIN:  Are you having pain? Yes: NPRS scale: 6/10 Pain location: Right knee Pain description: OA related-only when moving, at rest pain is 0/10 Aggravating factors: weight-bearing Relieving factors: pt recently received shot for pain on right side and it is helping  TODAY'S TREATMENT: Discussed d/c plan with daughter and realistic goal expectations due to chronicity of CVA and plateau with outpatient therapy.  Discussed transition to home health to promote functional carryover from setting to home with daughter to pursue referral as she sees fit.  Time spent addressing scheduling and canceling appt scheduled past cert date as appt is functionally not needed.  Discussed functional activities at home to promote posture and core activation to prevent functional regression.  NMR:  -seated march w/ AAROM on left LE  -transfer maxA +  2 to maintain forward lean, place slide board, and level transfer w/c <> mat table  -lateral right reach w/ bean bag retrieval w/ crossbody reach to place in basket to engage core, frequent rest breaks against large physioball required to complete task  PATIENT EDUCATION: Education details: Continue HEP.   Person educated: Patient and Child(ren) Education method: Explanation and Demonstration Education comprehension: verbalized understanding and needs further education      HOME EXERCISE PROGRAM:  Access Code: [email or web address] URL:  [email or web address] Date: 08/18/2021 Prepared by: Willow Ora  Exercises - Seated Heel Raise  - 1 x daily - 5 x weekly - 1 sets - 10 reps - seated hamstring stretch  - 1 x daily - 5 x weekly - 1 sets - 3 reps - 30 seconds hold - Supine Transversus Abdominis Bracing - Hands on Ground  - 1 x daily - 5 x weekly - 1 sets - 10 reps - Hip and Knee Extension and Flexion Caregiver PROM  - 1 x daily - 5 x weekly - 1 sets - 10 reps - Supine Knee Extension Strengthening  - 1 x daily - 5 x weekly - 1 sets - 10 reps - Supine Lower Trunk Rotation  - 1 x daily - 5 x weekly - 1 sets - 5-6 reps - Supine Hip Adduction Isometric with Ball  - 1 x daily - 5 x weekly - 1 sets - 10 reps - 3 seconds hold  - Seated March  - 1 x daily - 7 x weekly - 3 sets - 10 reps - Seated Long Arc Quad  - 1 x daily - 7 x weekly - 3 sets - 10 reps - Seated Hip Abduction  - 1 x daily - 7 x weekly - 3 sets - 10 reps  GOALS  Goals reviewed with patient? Yes   SHORT TERM GOALS:   Target date: 10/29/2021  Pt will be compliant to HEP with caregiver assist for stretching and strengthening x4 days a week. Baseline: Intermittent compliance as CNA available, daughter working on more consistent help; daughter states new aide is working on HEP more frequently with patient. Goal status: MET  2.  Pt will be able to perform slideboard transfer after board placed mod assist for improved mobility. Baseline: MaxA; 11/01/2021 mod-maxA Goal status: NOT MET  3.  Pt will stand in [place] w/ maxA of 1 in order to work on standing tolerance and BLE weight bearing. Baseline: MaxA of 2 w/ Stedy; TotalA of 2 w/ Stedy Goal status: NOT MET  4.  Pt will perform sit<>supine modA to improve functional mobility at home. Baseline: MaxA; 11/01/2021 TotalA of 1 Goal status: NOT MET  LONG TERM GOALS:  Target date: 11/26/2021  Pt will report decreased pain in right knee during functional transfers with sliding board in order to improve LE  engagement to transfers and functional mobility. Baseline: 7/10 constant ache Goal status: INITIAL  2.  Pt will perform reach to floor from sitting and return to upright sitting supervision level to demonstrate improved functional balance for ADL performance. Baseline: Can reach forward and bilaterally outside BOS ~5 inches. Goal status: INITIAL  3.  Pt will tolerate standing in [place] x2 mins to improve weight bearing tolerance. Baseline: MaxA of 2 w/ manual Stedy, relies on seat Goal status: REVISED  4.  Pt will perform sliding board transfer after board placed modA in order to improve safety and functional independence with transfers with decreased caregiver assist. Baseline: MaxA Goal  status: REVISED  ASSESSMENT:   CLINICAL IMPRESSION: Continuation of prior conversations about realistic goals and expectations had with daughter and patient today in regards to standing and ambulation in relation to current functional level.  Reviewed in detail some functional tasks patient can work on at home to promote maintenance of gains with instructions to seek further home health PT for carryover to home environment as desired.  Focused remainder of session on core activation in unsupported sitting, transfer training with slide board, and activation of hip flexors with active-assist.  Pt demonstrates increased need of assistance with transfers today and difficulty maintaining forward lean without excess posterior support.  Will continue to address this deficit and others in remainder of visits.   OBJECTIVE IMPAIRMENTS Abnormal gait, decreased balance, decreased knowledge of use of DME, decreased mobility, decreased ROM, decreased strength, dizziness, increased edema, impaired UE functional use, and pain.    ACTIVITY LIMITATIONS cleaning, community activity, driving, meal prep, laundry, medication management, yard work, and shopping.    PERSONAL FACTORS Age, Time since onset of  injury/illness/exacerbation, and 3+ comorbidities: hypertension, cervical DDD, bilateral DVTs admitted on 01/05/2021 for bilateral hand weakness, numbness and the left leg weakness  are also affecting patient's functional outcome.      REHAB POTENTIAL: Fair     CLINICAL DECISION MAKING: Evolving/moderate complexity   EVALUATION COMPLEXITY: Moderate   PLAN: PT FREQUENCY: 2x/week for 4 weeks followed by 1x/week for 4 weeks   PT DURATION: 8 weeks   PLANNED INTERVENTIONS: Therapeutic exercises, Therapeutic activity, Neuromuscular re-education, Balance training, Gait training, Patient/Family education, Joint mobilization, Vestibular training, Canalith repositioning, Orthotic/Fit training, DME instructions, Wheelchair mobility training, Cryotherapy, Moist heat, and Manual therapy   PLAN FOR NEXT SESSION:    Use SciFit -per daughter's request!  Weight shifting, practice multi-directional scooting EOM.  Continue multidirectional reaching and further assess trunk rotation in sitting.    Bed mobility. Is Hoyer lift transfer at this time-progressing to sliding board (continue to assess/work on). Sitting EOM.  Supine <>sit.  Continue to work on [place] strengthening in sitting and supine.  Address IR/ER of hip in supine, further stretching-especially Left ankle, glut/quad sets, bridging, heel slides-use tapping at tendon insertion, trunk rotations in sitting, anterior weight shifts  Debbora Dus, PT, DPT, CBIS

## 2021-11-09 ENCOUNTER — Other Ambulatory Visit (HOSPITAL_COMMUNITY): Payer: Self-pay | Admitting: Internal Medicine

## 2021-11-09 DIAGNOSIS — I82402 Acute embolism and thrombosis of unspecified deep veins of left lower extremity: Secondary | ICD-10-CM

## 2021-11-15 ENCOUNTER — Ambulatory Visit: Payer: Medicare Other | Admitting: Occupational Therapy

## 2021-11-15 ENCOUNTER — Encounter: Payer: Self-pay | Admitting: Physical Therapy

## 2021-11-15 ENCOUNTER — Ambulatory Visit: Payer: Medicare Other | Admitting: Physical Therapy

## 2021-11-15 DIAGNOSIS — M6281 Muscle weakness (generalized): Secondary | ICD-10-CM | POA: Diagnosis not present

## 2021-11-15 DIAGNOSIS — I69354 Hemiplegia and hemiparesis following cerebral infarction affecting left non-dominant side: Secondary | ICD-10-CM

## 2021-11-15 DIAGNOSIS — R2689 Other abnormalities of gait and mobility: Secondary | ICD-10-CM

## 2021-11-15 NOTE — Patient Instructions (Signed)
Access Code: [email or web address] URL: [email or web address] Date: 11/15/2021 Prepared by: Elease Etienne  Exercises - Seated Heel Raise  - 1 x daily - 5 x weekly - 1 sets - 10 reps - Supine Transversus Abdominis Bracing - Hands on Ground  - 1 x daily - 5 x weekly - 1 sets - 6 reps - Hip and Knee Extension and Flexion Caregiver PROM  - 1 x daily - 5 x weekly - 1 sets - 10 reps - Seated March  - 1 x daily - 7 x weekly - 3 sets - 10 reps - Seated Long Arc Quad  - 1 x daily - 7 x weekly - 3 sets - 10 reps - Seated Hip Abduction  - 1 x daily - 7 x weekly - 3 sets - 10 reps - Supine Ankle Dorsiflexion and Plantarflexion AROM  - 1 x daily - 7 x weekly - 3 sets - 10 reps

## 2021-11-15 NOTE — Patient Instructions (Signed)
Recommendations for [name]:   1) Use sliding board for level surface transfers when safe to do so. She requires max assist. Otherwise, use hoyer lift  2) Have patient sit up at edge of bed (feet flat on ground) to eat meals when possible.  Try and have her sit unsupported edge of bed w/ supervision 3x/day for at least 10 minutes  3) Have her reach for things in front of her and to Rt side with Rt arm (can place cup slightly to Rt side, reach for her shirt on Rt, etc)

## 2021-11-15 NOTE — Therapy (Signed)
OUTPATIENT PHYSICAL THERAPY TREATMENT NOTE   Patient Name: [name] MRN: [id number] DOB:04/13/1929, 88 y.o., female Today's Date: 11/15/2021  PCP: Nolene Ebbs, MD  REFERRING PROVIDER: Nolene Ebbs, MD    END OF SESSION:   PT End of Session - 11/15/21 1402     Visit Number 21    Number of Visits 29    Date for PT Re-Evaluation 11/26/21    Authorization Type [employer] medicare so 10th visit progress note    Progress Note Due on Visit 88    PT Start Time 1402   received from OT   PT Stop Time 1446    PT Time Calculation (min) 44 min    Activity Tolerance Patient tolerated treatment well    Behavior During Therapy WFL for tasks assessed/performed              Past Medical History:  Diagnosis Date   Allergic rhinitis    Arthritis    Carpal tunnel syndrome    Deep venous thrombosis (HCC)    bilateral legs   Degenerative arthritis    right knee   Hypertension    Lumbar degenerative disc disease    Lump or mass in breast    Paresthesia    Syncope 05/24/2013   Vitamin D deficiency    Past Surgical History:  Procedure Laterality Date   ABDOMINAL HYSTERECTOMY     CARPAL TUNNEL RELEASE Right    CATARACT EXTRACTION Bilateral    HEMORROIDECTOMY     IVC Filter     TOTAL HIP ARTHROPLASTY Right    ULNAR NERVE TRANSPOSITION Right    Patient Active Problem List   Diagnosis Date Noted   Obesity (BMI 30-39.9) 01/28/2021   Acute CVA (cerebrovascular accident) ([place]) 01/25/2021   Acute kidney injury ([place]) 01/25/2021   CVA (cerebral vascular accident) ([place]) 01/06/2021   Essential hypertension 01/06/2021   Arthritis 01/06/2021   Pain due to onychomycosis of toenails of both feet 12/14/2018   Presbycusis of both ears 09/22/2015   Syncope 05/24/2013    REFERRING DIAG: I63.9 (ICD-10-CM) - CVA (cerebral vascular accident)    THERAPY DIAG:  Muscle weakness (generalized)  Hemiplegia and hemiparesis following cerebral infarction affecting left non-dominant side  (HCC)  Other abnormalities of gait and mobility  PERTINENT HISTORY: 88 year old female with history of smoking, hypertension, cervical DDD, bilateral DVTs admitted on 01/05/2021 for bilateral hand weakness, numbness and the left leg weakness.    PRECAUTIONS: Fall   SUBJECTIVE: Pt and daughter deny any falls or other acute issues.  Daughter states recent change in home health aide due to current agency dropping them as they cannot meet the hours needed and accommodate daughter's night shift schedule.  Daughter states she has 2 agencies that will provide CNA/RN level care as last agency states pt requires more skill than they can provide with their level of care.    PAIN:  Are you having pain? Yes: NPRS scale: 6/10 Pain location: Right knee Pain description: OA related-only when moving, at rest pain is 0/10 Aggravating factors: weight-bearing Relieving factors: pt recently received shot for pain on right side and it is helping  TODAY'S TREATMENT:  Condensed and made additions to HEP.  See below for details: -Seated march focused on AAROM of LLE and AROM of RLE x4 -Seated LAQ x3 RLE, attempted in partial range w/ active assist on LLE w/o production of movement but mildly palpable contraction w/ tapping at tendon insertion -Reviewed hamstring stretch w/ deletion of task due to  increased discomfort and need for caregiver assist (daughter is not able to provide and they are b/w agencies) to obtain adequate stretch -Semi-fowler TrA activation 6x5 sec w/ difficulty holding contraction w/o holding breath  -PROM to BLE w/ AROM of bilateral DF/PF to fatigue -multiple bouts of return to sitting EOM from semi-fowler's w/ functional push/pull w/ RUE  -Seated heel raises x5 prior to fatigue -Seated hip abduction x8 w/ tactile cue to engage LLE in minimal ROM  NMR: -Scooting 4 directions w/ maxA -Sliding board transfer to right maxA w/ pt intermittently pulling w/ RUE at initiation of  transfer  PATIENT EDUCATION: Education details: Continue HEP. D/C plan for next session.  Re-iterated discussion of functional activities at home to promote posture and core activation to prevent functional regression - OT writing functional directions for caregivers to assist with ADL completion. Person educated: Patient and Child(ren) Education method: Explanation and Demonstration Education comprehension: verbalized understanding and needs further education      HOME EXERCISE PROGRAM:  Access Code: [email or web address] URL: [email or web address] Date: 11/15/2021 Prepared by: Elease Etienne  Exercises - Seated Heel Raise  - 1 x daily - 5 x weekly - 1 sets - 10 reps - Supine Transversus Abdominis Bracing - Hands on Ground  - 1 x daily - 5 x weekly - 1 sets - 6 reps - Hip and Knee Extension and Flexion Caregiver PROM  - 1 x daily - 5 x weekly - 1 sets - 10 reps - Seated March  - 1 x daily - 7 x weekly - 3 sets - 10 reps - Seated Long Arc Quad  - 1 x daily - 7 x weekly - 3 sets - 10 reps - Seated Hip Abduction  - 1 x daily - 7 x weekly - 3 sets - 10 reps - Supine Ankle Dorsiflexion and Plantarflexion AROM  - 1 x daily - 7 x weekly - 3 sets - 10 reps   GOALS  Goals reviewed with patient? Yes   SHORT TERM GOALS:   Target date: 10/29/2021  Pt will be compliant to HEP with caregiver assist for stretching and strengthening x4 days a week. Baseline: Intermittent compliance as CNA available, daughter working on more consistent help; daughter states new aide is working on HEP more frequently with patient. Goal status: MET  2.  Pt will be able to perform slideboard transfer after board placed mod assist for improved mobility. Baseline: MaxA; 11/01/2021 mod-maxA Goal status: NOT MET  3.  Pt will stand in [place] w/ maxA of 1 in order to work on standing tolerance and BLE weight bearing. Baseline: MaxA of 2 w/ Stedy; TotalA of 2 w/ Stedy Goal status: NOT MET  4.  Pt will perform  sit<>supine modA to improve functional mobility at home. Baseline: MaxA; 11/01/2021 TotalA of 1 Goal status: NOT MET  LONG TERM GOALS:  Target date: 11/26/2021  Pt will report decreased pain in right knee during functional transfers with sliding board in order to improve LE engagement to transfers and functional mobility. Baseline: 7/10 constant ache Goal status: INITIAL  2.  Pt will perform reach to floor from sitting and return to upright sitting supervision level to demonstrate improved functional balance for ADL performance. Baseline: Can reach forward and bilaterally outside BOS ~5 inches. Goal status: INITIAL  3.  Pt will tolerate standing in [place] x2 mins to improve weight bearing tolerance. Baseline: MaxA of 2 w/ manual Stedy, relies on seat; pt cannot tolerate due to return in  inc R knee pain-goal D/C'd 11/15/2021. Goal status: REVISED  4.  Pt will perform sliding board transfer after board placed modA in order to improve safety and functional independence with transfers with decreased caregiver assist. Baseline: MaxA Goal status: REVISED  ASSESSMENT:   CLINICAL IMPRESSION: Reviewed and condensed HEP this session to promote ease of transition of care to maintenance at home with caregiver or aide.  Reviewed previous HEP and new additions made today with instructions to daughter as able.  Pt demonstrates improved core activation this session with inc time and max encouragement to engage to task.  This improved sliding board transfer initiation with better anterior weight shift and functional RUE use.  Pt still requiring maxA for most transfer tasks and does require intermittent rest between activities.  Discussed continuing to engage LLE as able to HEP to prevent further weakening of muscles.  Will assess LTGs and discharge pt next session.   OBJECTIVE IMPAIRMENTS Abnormal gait, decreased balance, decreased knowledge of use of DME, decreased mobility, decreased ROM, decreased  strength, dizziness, increased edema, impaired UE functional use, and pain.    ACTIVITY LIMITATIONS cleaning, community activity, driving, meal prep, laundry, medication management, yard work, and shopping.    PERSONAL FACTORS Age, Time since onset of injury/illness/exacerbation, and 3+ comorbidities: hypertension, cervical DDD, bilateral DVTs admitted on 01/05/2021 for bilateral hand weakness, numbness and the left leg weakness  are also affecting patient's functional outcome.      REHAB POTENTIAL: Fair     CLINICAL DECISION MAKING: Evolving/moderate complexity   EVALUATION COMPLEXITY: Moderate   PLAN: PT FREQUENCY: 2x/week for 4 weeks followed by 1x/week for 4 weeks   PT DURATION: 8 weeks   PLANNED INTERVENTIONS: Therapeutic exercises, Therapeutic activity, Neuromuscular re-education, Balance training, Gait training, Patient/Family education, Joint mobilization, Vestibular training, Canalith repositioning, Orthotic/Fit training, DME instructions, Wheelchair mobility training, Cryotherapy, Moist heat, and Manual therapy   PLAN FOR NEXT SESSION:   [place] on [employer] -per daughter's request! Assess LTGs-D/C!   Elease Etienne, PT, DPT

## 2021-11-15 NOTE — Therapy (Signed)
OUTPATIENT OCCUPATIONAL THERAPY TREATMENT NOTE    Patient Name: [name] MRN: [id number] DOB:[birth year], 88 y.o., female Today's Date: 11/15/2021  PCP: Nolene Ebbs, MD  REFERRING PROVIDER: Nolene Ebbs, MD   END OF SESSION:   OT End of Session - 11/15/21 1454     Visit Number 18    Number of Visits 22    Date for OT Re-Evaluation 12/06/21    Authorization Type UHC Medicare and Medicaid,Covered 100% as long as she's enrolled with Medicaid  VL: Follows Medicare guidelines    OT Start Time 66    OT Stop Time 1400    OT Time Calculation (min) 40 min    Equipment Utilized During Treatment sliding board    Activity Tolerance Patient tolerated treatment well    Behavior During Therapy WFL for tasks assessed/performed              Past Medical History:  Diagnosis Date   Allergic rhinitis    Arthritis    Carpal tunnel syndrome    Deep venous thrombosis (HCC)    bilateral legs   Degenerative arthritis    right knee   Hypertension    Lumbar degenerative disc disease    Lump or mass in breast    Paresthesia    Syncope 05/24/2013   Vitamin D deficiency    Past Surgical History:  Procedure Laterality Date   ABDOMINAL HYSTERECTOMY     CARPAL TUNNEL RELEASE Right    CATARACT EXTRACTION Bilateral    HEMORROIDECTOMY     IVC Filter     TOTAL HIP ARTHROPLASTY Right    ULNAR NERVE TRANSPOSITION Right    Patient Active Problem List   Diagnosis Date Noted   Obesity (BMI 30-39.9) 01/28/2021   Acute CVA (cerebrovascular accident) ([place]) 01/25/2021   Acute kidney injury ([place]) 01/25/2021   CVA (cerebral vascular accident) ([place]) 01/06/2021   Essential hypertension 01/06/2021   Arthritis 01/06/2021   Pain due to onychomycosis of toenails of both feet 12/14/2018   Presbycusis of both ears 09/22/2015   Syncope 05/24/2013    ONSET DATE: 07/13/21 Referral date   REFERRING DIAG: Hemiplegia and hemiparesis following cerebral infarction affecting left non-dominant side  ([place])   THERAPY DIAG:  Hemiplegia and hemiparesis following cerebral infarction affecting left non-dominant side (HCC)  Muscle weakness (generalized)   PERTINENT HISTORY: vertigo, smoking, hypertension, cervical DDD, bilateral DVTs  multiple strokes (09/22, 10/22) .  right MCA territory scattered infarcts.  MRI C-spine showed C3/4 severe spinal stenosis and mild spinal cord compression.  EF 60 to 65%   PRECAUTIONS: fall  SUBJECTIVE: Pt denies shoulder pain today  PAIN:  Are you having pain? Yes NPRS scale: 8/10 at rest, fluctuates w/ movement Pain location: knees Pain orientation: Bilateral  PAIN TYPE: aching Pain description: intermittent  Aggravating factors: passive movement  Relieving factors: rest, injections      OBJECTIVE:   TODAY'S TREATMENT:  TODAY'S TREATMENT:  11/15/21  Level sliding board transfer to Lt side w/ total assist (max assist x 1, min to mod assist x 1).   Seated EOB working on dynamic sitting balance, forward reach, reaching to Rt side and forward as pt tends to lean posteriorly and to Lt side. Worked on going from semi reclined to upright position, reaching forward towards floor and coming back up, wt bearing over each elbow and coming back upright.   Pt's daughter reported she is getting more assistance up to 40 hrs/week through [place] beginning this week. Recommended having her  sit up at edge of bed to eat and t/o day when possible w/ supervision   HOME EXERCISE PROGRAM: 08/18/21:  Table slides BUE  09/08/21:  ADL - Donning/doffing pull over shirt   09/14/21: splint wear and care     GOALS: Goals reviewed with patient? Yes   SHORT TERM GOALS: Target date: 08/31/2021   Patient and caregiver will complete an HEP designed to improve active range of motion in LUE Baseline: Stretching program Goal status: Achieved   2.  Patient will demonstrate 30% composite flexion in digits Baseline: 20% then painful Goal status: Achieved   3.  Patient  will sit at edge of mat table with intermittent min assistance x 30 seconds in preparation for active participation in ADL Baseline: 3-5 sec  Goal status: Achieved   4.  Patient will lean forward while seated at edge of mat as if reaching toward shoes/socks - reach past knees 2/3 down length of calf Baseline: just below knee Goal status: Achieved   5.  Patient will wash her face, chest and left arm after set up assistance Baseline: Dependent Goal status: Achieved 10/04/21   LONG TERM GOALS: Target date: 12/06/21   Patient will complete home activity program designed to improve functional use of LUE in simple self care tasks Baseline: No HEP Goal status: Ongoing   2.  Patient will report pain no greater than 3/10 in LEFT shoulder with active reach Baseline: 6/10 Goal status: MET   3.  Patient will complete an active level surface transfer with max assist Baseline: dependent- hoyer Goal status: Partially met - inconsistent with SB transfers    4.  Patient will assist with pulling up and down lower body clothing to reduce caregiver burden for dressing Baseline: Dependent Goal status: NOT MET- pt reports assisting some but not consistently.    5.  Patient will demonstrate at least 50% composite flexion in left hand to aide with grasp/release Baseline: 20% flex Goal status: Ongoing - approx 20% active flexion       ASSESSMENT:   CLINICAL IMPRESSION:   Patient's sliding board transfer with less assistance but still occasionally needs second person to assist. Sitting balance much improved.  PERFORMANCE DEFICITS in functional skills including ADLs, IADLs, coordination, dexterity, proprioception, sensation, edema, tone, ROM, strength, pain, fascial restrictions, muscle spasms, flexibility, FMC, GMC, mobility, balance, body mechanics, endurance, cardiopulmonary status limiting function, decreased knowledge of use of DME, UE functional use, and vestibular, cognitive skills including  attention, energy/drive, memory, orientation, perception, problem solving, safety awareness, and sequencing, and psychosocial skills including environmental adaptation and habits.    IMPAIRMENTS are limiting patient from ADLs, IADLs, rest and sleep, leisure, and social participation.    COMORBIDITIES may have co-morbidities  that affects occupational performance. Patient will benefit from skilled OT to address above impairments and improve overall function.   MODIFICATION OR ASSISTANCE TO COMPLETE EVALUATION: Min-Moderate modification of tasks or assist with assess necessary to complete an evaluation.   OT OCCUPATIONAL PROFILE AND HISTORY: Detailed assessment: Review of records and additional review of physical, cognitive, psychosocial history related to current functional performance.   CLINICAL DECISION MAKING: Moderate - several treatment options, min-mod task modification necessary   REHAB POTENTIAL: Good   EVALUATION COMPLEXITY: Moderate     PLAN: OT FREQUENCY: 1x/week   OT DURATION: 10 weeks 9 additional visits at renewal over 10 weeks   PLANNED INTERVENTIONS: self care/ADL training, therapeutic exercise, therapeutic activity, neuromuscular re-education, manual therapy, passive range of motion, balance training,  functional mobility training, splinting, electrical stimulation, ultrasound, paraffin, fluidotherapy, moist heat, cryotherapy, patient/family education, cognitive remediation/compensation, visual/perceptual remediation/compensation, energy conservation, coping strategies training, and DME and/or AE instructions   RECOMMENDED OTHER SERVICES: Receiving PT   CONSULTED AND AGREED WITH PLAN OF CARE: Patient and family member/caregiver   PLAN FOR NEXT SESSION:   check remaining goals and d/c next visit. Issue handout on recommendations for CAPS to do with patient      Hans Eden, OT 11/15/2021, 2:55 PM

## 2021-11-18 ENCOUNTER — Ambulatory Visit (HOSPITAL_COMMUNITY)
Admission: RE | Admit: 2021-11-18 | Discharge: 2021-11-18 | Disposition: A | Discharge status: Home / Self Care | Payer: Medicare Other | Source: Ambulatory Visit | Attending: Internal Medicine | Admitting: Internal Medicine

## 2021-11-18 DIAGNOSIS — I82402 Acute embolism and thrombosis of unspecified deep veins of left lower extremity: Secondary | ICD-10-CM | POA: Insufficient documentation

## 2021-11-22 ENCOUNTER — Encounter: Payer: Self-pay | Admitting: Occupational Therapy

## 2021-11-22 ENCOUNTER — Encounter: Payer: Self-pay | Admitting: Physical Therapy

## 2021-11-22 ENCOUNTER — Ambulatory Visit: Payer: Medicare Other | Attending: Internal Medicine | Admitting: Physical Therapy

## 2021-11-22 ENCOUNTER — Ambulatory Visit: Payer: Medicare Other | Admitting: Occupational Therapy

## 2021-11-22 DIAGNOSIS — M6281 Muscle weakness (generalized): Secondary | ICD-10-CM | POA: Insufficient documentation

## 2021-11-22 DIAGNOSIS — R2681 Unsteadiness on feet: Secondary | ICD-10-CM | POA: Diagnosis present

## 2021-11-22 DIAGNOSIS — R2689 Other abnormalities of gait and mobility: Secondary | ICD-10-CM | POA: Insufficient documentation

## 2021-11-22 DIAGNOSIS — I69354 Hemiplegia and hemiparesis following cerebral infarction affecting left non-dominant side: Secondary | ICD-10-CM

## 2021-11-22 NOTE — Therapy (Signed)
OUTPATIENT OCCUPATIONAL THERAPY TREATMENT NOTE    Patient Name: [name] MRN: [id number] DOB:[birth year], 88 y.o., female Today's Date: 11/22/2021  PCP: Nolene Ebbs, MD  REFERRING PROVIDER: Nolene Ebbs, MD   END OF SESSION:   OT End of Session - 11/22/21 1319     Visit Number 19    Number of Visits 22    Date for OT Re-Evaluation 12/06/21    Authorization Type UHC Medicare and Medicaid,Covered 100% as long as she's enrolled with Medicaid  VL: Follows Medicare guidelines    OT Start Time 1320    OT Stop Time 1400    OT Time Calculation (min) 40 min    Activity Tolerance Patient tolerated treatment well    Behavior During Therapy WFL for tasks assessed/performed              Past Medical History:  Diagnosis Date   Allergic rhinitis    Arthritis    Carpal tunnel syndrome    Deep venous thrombosis (HCC)    bilateral legs   Degenerative arthritis    right knee   Hypertension    Lumbar degenerative disc disease    Lump or mass in breast    Paresthesia    Syncope 05/24/2013   Vitamin D deficiency    Past Surgical History:  Procedure Laterality Date   ABDOMINAL HYSTERECTOMY     CARPAL TUNNEL RELEASE Right    CATARACT EXTRACTION Bilateral    HEMORROIDECTOMY     IVC Filter     TOTAL HIP ARTHROPLASTY Right    ULNAR NERVE TRANSPOSITION Right    Patient Active Problem List   Diagnosis Date Noted   Obesity (BMI 30-39.9) 01/28/2021   Acute CVA (cerebrovascular accident) ([place]) 01/25/2021   Acute kidney injury ([place]) 01/25/2021   CVA (cerebral vascular accident) ([place]) 01/06/2021   Essential hypertension 01/06/2021   Arthritis 01/06/2021   Pain due to onychomycosis of toenails of both feet 12/14/2018   Presbycusis of both ears 09/22/2015   Syncope 05/24/2013    ONSET DATE: 07/13/21 Referral date   REFERRING DIAG: Hemiplegia and hemiparesis following cerebral infarction affecting left non-dominant side ([place])   THERAPY DIAG:  Hemiplegia and hemiparesis  following cerebral infarction affecting left non-dominant side (HCC)  Muscle weakness (generalized)   PERTINENT HISTORY: vertigo, smoking, hypertension, cervical DDD, bilateral DVTs  multiple strokes (09/22, 10/22) .  right MCA territory scattered infarcts.  MRI C-spine showed C3/4 severe spinal stenosis and mild spinal cord compression.  EF 60 to 65%   PRECAUTIONS: fall  SUBJECTIVE: No changes  PAIN:  Are you having pain? Yes NPRS scale: 8/10 at rest, fluctuates w/ movement Pain location: Rt knee Pain orientation: Bilateral  PAIN TYPE: aching Pain description: intermittent  Aggravating factors: passive movement  Relieving factors: rest, injections      OBJECTIVE:   TODAY'S TREATMENT:  11/22/21  Reviewed recommendations for home aide and issued updated HEP - pt's daughter videotaped exercises. See pt instructions for details  PATIENT EDUCATION: Education details: HEP for LUE Person educated: Patient and Child(ren) Education method: Explanation, Demonstration, Verbal cues, and Handouts Education comprehension: verbalized understanding, returned demonstration, verbal cues required, and tactile cues required    HOME EXERCISE PROGRAM: 08/18/21:  Table slides BUE  09/08/21:  ADL - Donning/doffing pull over shirt   09/14/21: splint wear and care 11/22/21: updated HEP      GOALS: Goals reviewed with patient? Yes   SHORT TERM GOALS: Target date: 08/31/2021   Patient and caregiver will complete  an HEP designed to improve active range of motion in LUE Baseline: Stretching program Goal status: Achieved   2.  Patient will demonstrate 30% composite flexion in digits Baseline: 20% then painful Goal status: Achieved   3.  Patient will sit at edge of mat table with intermittent min assistance x 30 seconds in preparation for active participation in ADL Baseline: 3-5 sec  Goal status: Achieved   4.  Patient will lean forward while seated at edge of mat as if reaching toward  shoes/socks - reach past knees 1/2 down length of calf Baseline: just below knee Goal status: Achieved   5.  Patient will wash her face, chest and left arm after set up assistance Baseline: Dependent Goal status: Achieved 10/04/21   LONG TERM GOALS: Target date: 12/06/21   Patient will complete home activity program designed to improve functional use of LUE in simple self care tasks Baseline: No HEP Goal status: MET   2.  Patient will report pain no greater than 3/10 in LEFT shoulder with active reach Baseline: 6/10 Goal status: MET   3.  Patient will complete an active level surface transfer with max assist Baseline: dependent- hoyer Goal status: Partially met - inconsistent with SB transfers - usually needs 2nd person assist   4.  Patient will assist with pulling up and down lower body clothing to reduce caregiver burden for dressing Baseline: Dependent Goal status: NOT MET- pt reports assisting some but not consistently.    5.  Patient will demonstrate at least 50% composite flexion in left hand to aide with grasp/release Baseline: 20% flex Goal status: NOT MET - approx 20% active flexion       ASSESSMENT:   CLINICAL IMPRESSION:  Pt met all STG's and 2 LTG's. Pt has reached maximal rehab potential  PERFORMANCE DEFICITS in functional skills including ADLs, IADLs, coordination, dexterity, proprioception, sensation, edema, tone, ROM, strength, pain, fascial restrictions, muscle spasms, flexibility, FMC, GMC, mobility, balance, body mechanics, endurance, cardiopulmonary status limiting function, decreased knowledge of use of DME, UE functional use, and vestibular, cognitive skills including attention, energy/drive, memory, orientation, perception, problem solving, safety awareness, and sequencing, and psychosocial skills including environmental adaptation and habits.    IMPAIRMENTS are limiting patient from ADLs, IADLs, rest and sleep, leisure, and social participation.     COMORBIDITIES may have co-morbidities  that affects occupational performance. Patient will benefit from skilled OT to address above impairments and improve overall function.   MODIFICATION OR ASSISTANCE TO COMPLETE EVALUATION: Min-Moderate modification of tasks or assist with assess necessary to complete an evaluation.   OT OCCUPATIONAL PROFILE AND HISTORY: Detailed assessment: Review of records and additional review of physical, cognitive, psychosocial history related to current functional performance.   CLINICAL DECISION MAKING: Moderate - several treatment options, min-mod task modification necessary   REHAB POTENTIAL: Good   EVALUATION COMPLEXITY: Moderate     PLAN: OT FREQUENCY: 1x/week   OT DURATION: 10 weeks 9 additional visits at renewal over 10 weeks   PLANNED INTERVENTIONS: self care/ADL training, therapeutic exercise, therapeutic activity, neuromuscular re-education, manual therapy, passive range of motion, balance training, functional mobility training, splinting, electrical stimulation, ultrasound, paraffin, fluidotherapy, moist heat, cryotherapy, patient/family education, cognitive remediation/compensation, visual/perceptual remediation/compensation, energy conservation, coping strategies training, and DME and/or AE instructions   RECOMMENDED OTHER SERVICES: Receiving PT   CONSULTED AND AGREED WITH PLAN OF CARE: Patient and family member/caregiver   PLAN FOR NEXT SESSION:   check remaining goals and d/c next visit. Issue handout on recommendations  for CAPS to do with patient   OCCUPATIONAL THERAPY DISCHARGE SUMMARY  Visits from Start of Care: 19  Current functional level related to goals / functional outcomes: See above   Remaining deficits: Same as initial evaluation. Pt w/c dependent and requires hoyer lift for transfers or sliding board w/ 2 person assist. Pt requires assist for all ADLS   Education / Equipment: HEP's, splint wear and care   Patient agrees to  discharge. Patient goals were partially met. Patient is being discharged due to lack of progress.Hans Eden, OT 11/22/2021, 1:20 PM

## 2021-11-22 NOTE — Patient Instructions (Signed)
HOME PROGRAM FOR LT ARM (Perform 2-3 times per day) :   (First 3 exercises will need someone else to do. Have Dot do exercises 4-5)  1) Laying down or sitting up: gentle raise arm up to 90 degrees slowly in front of her x 10 reps (Laying down is easier - if laying down, arm will be up towards ceiling). Refer to video  2) Gently straighten elbow and hold 5-10 sec. Repeat 10 times. Refer to video  3) Gently bend fingers and hold 5-10 sec. Repeat 10 times. Refer to video  4) Sitting up at table, perform table slides: fold hand towel up and place on table, guide Lt arm w/ Rt hand and slide towel in front of her and back towards chest x 10 (refer to older video). May still need assist to help straighten elbow and cues to lean forward towards table  5) Sitting in wheelchair, hold Lt wrist with Rt hand and reach towards floor (must lean forward) to mid shin, then back to belly button x 10 reps

## 2021-11-22 NOTE — Therapy (Unsigned)
OUTPATIENT PHYSICAL THERAPY TREATMENT NOTE   Patient Name: [name] MRN: [id number] DOB:Sep 14, 1928, 88 y.o., female Today's Date: 11/22/2021  PCP: Nolene Ebbs, MD  REFERRING PROVIDER: Nolene Ebbs, MD  PHYSICAL THERAPY DISCHARGE SUMMARY  Visits from Start of Care: 22  Current functional level related to goals / functional outcomes: See clinical impression statement.   Remaining deficits: Unsupported sitting tolerance, functional use of LUE, unable to stand due to ongoing weakness and bilateral knee pain, mod-maxA (intermittently +2) with use of sliding board and to transition supine<>sit.   Education / Equipment: D/C plan, continue updated HEP, functional ADL tasks with caregivers   Patient agrees to discharge. Patient goals were not met. Patient is being discharged due to maximized rehab potential.    END OF SESSION:   PT End of Session - 11/22/21 1424     Visit Number 22    Number of Visits 29    Date for PT Re-Evaluation 11/26/21    Authorization Type [employer]HC medicare so 10th visit progress note    Progress Note Due on Visit 106    PT Start Time 1405   received from OT   PT Stop Time 1446    PT Time Calculation (min) 41 min    Activity Tolerance Patient tolerated treatment well    Behavior During Therapy WFL for tasks assessed/performed              Past Medical History:  Diagnosis Date   Allergic rhinitis    Arthritis    Carpal tunnel syndrome    Deep venous thrombosis (HCC)    bilateral legs   Degenerative arthritis    right knee   Hypertension    Lumbar degenerative disc disease    Lump or mass in breast    Paresthesia    Syncope 05/24/2013   Vitamin D deficiency    Past Surgical History:  Procedure Laterality Date   ABDOMINAL HYSTERECTOMY     CARPAL TUNNEL RELEASE Right    CATARACT EXTRACTION Bilateral    HEMORROIDECTOMY     IVC Filter     TOTAL HIP ARTHROPLASTY Right    ULNAR NERVE TRANSPOSITION Right    Patient Active Problem List    Diagnosis Date Noted   Obesity (BMI 30-39.9) 01/28/2021   Acute CVA (cerebrovascular accident) ([place]) 01/25/2021   Acute kidney injury ([place]) 01/25/2021   CVA (cerebral vascular accident) ([place]) 01/06/2021   Essential hypertension 01/06/2021   Arthritis 01/06/2021   Pain due to onychomycosis of toenails of both feet 12/14/2018   Presbycusis of both ears 09/22/2015   Syncope 05/24/2013    REFERRING DIAG: I63.9 (ICD-10-CM) - CVA (cerebral vascular accident)    THERAPY DIAG:  Hemiplegia and hemiparesis following cerebral infarction affecting left non-dominant side (HCC)  Muscle weakness (generalized)  Other abnormalities of gait and mobility  Unsteadiness on feet  PERTINENT HISTORY: 88 year old female with history of smoking, hypertension, cervical DDD, bilateral DVTs admitted on 01/05/2021 for bilateral hand weakness, numbness and the left leg weakness.    PRECAUTIONS: Fall   SUBJECTIVE: Daughter states they will be receiving 40-50 hrs of care aide services a week through new agency that has not started yet, they will have a CNA.    PAIN:  Are you having pain? Yes: NPRS scale: 8/10 Pain location: Right knee Pain description: achy Aggravating factors: weight-bearing Relieving factors: unsure  TODAY'S TREATMENT: -SciFit for dynamic warmup and reciprocal mobility x8 mins at 28 steps/min with AAROM using BLE/RUE.  Pt requires rest after  task due to inc discomfort in right knee.  -Pt attempts multiple reaches to floor from w/c and successfully reaches tip of 8" cone x2.  Cued to use strategies from prior sessions after initial 3 attempts without success.  Allowed extended time to perform to promote core engagement as able.  Further discussion with patient and daughter about caregivers and functional tasks/transfers (supine <> sit EOB-w/ or w/o hoyer as able, sitting up in wheelchair, and pt sitting unsupported or reaching outside BOS w/ supervision) during daily tasks to encourage  participation in ADLs as able and maintenance of gains outside HEP.  PATIENT EDUCATION: Education details: Confirmed they received functional instructions typed out from OT.  Daughter states they understand them.  Discussed discharge plan for today. Person educated: Patient and Child(ren)-Daughter leaves gym shortly after onset of session and returns at end. Education method: Customer service manager Education comprehension: verbalized understanding and needs further education      HOME EXERCISE PROGRAM:  Access Code: [email or web address] URL: [email or web address] Date: 11/15/2021 Prepared by: Elease Etienne  Exercises - Seated Heel Raise  - 1 x daily - 5 x weekly - 1 sets - 10 reps - Supine Transversus Abdominis Bracing - Hands on Ground  - 1 x daily - 5 x weekly - 1 sets - 6 reps - Hip and Knee Extension and Flexion Caregiver PROM  - 1 x daily - 5 x weekly - 1 sets - 10 reps - Seated March  - 1 x daily - 7 x weekly - 3 sets - 10 reps - Seated Long Arc Quad  - 1 x daily - 7 x weekly - 3 sets - 10 reps - Seated Hip Abduction  - 1 x daily - 7 x weekly - 3 sets - 10 reps - Supine Ankle Dorsiflexion and Plantarflexion AROM  - 1 x daily - 7 x weekly - 3 sets - 10 reps   GOALS  Goals reviewed with patient? Yes   SHORT TERM GOALS:   Target date: 10/29/2021  Pt will be compliant to HEP with caregiver assist for stretching and strengthening x4 days a week. Baseline: Intermittent compliance as CNA available, daughter working on more consistent help; daughter states new aide is working on HEP more frequently with patient. Goal status: MET  2.  Pt will be able to perform slideboard transfer after board placed mod assist for improved mobility. Baseline: MaxA; 11/01/2021 mod-maxA Goal status: NOT MET  3.  Pt will stand in [place] w/ maxA of 1 in order to work on standing tolerance and BLE weight bearing. Baseline: MaxA of 2 w/ [place]; TotalA of 2 w/ Stedy Goal status: NOT MET  4.   Pt will perform sit<>supine modA to improve functional mobility at home. Baseline: MaxA; 11/01/2021 TotalA of 1 Goal status: NOT MET  LONG TERM GOALS:  Target date: 11/26/2021  Pt will report decreased pain in right knee during functional transfers with sliding board in order to improve LE engagement to transfers and functional mobility. Baseline: 7/10 constant ache; Increased pain during transfers and general ROM of right knee. Goal status: NOT MET  2.  Pt will perform reach to floor from sitting and return to upright sitting supervision level to demonstrate improved functional balance for ADL performance. Baseline: Can reach forward and bilaterally outside BOS ~5 inches. Goal status: NOT MET  3.  Pt will tolerate standing in [place] x2 mins to improve weight bearing tolerance. Baseline: MaxA of 2 w/ manual Stedy, relies on seat; pt  cannot tolerate due to return in inc R knee pain-goal D/C'd 11/15/2021. Goal status: D/C'd 11/15/2021 due to pain  4.  Pt will perform sliding board transfer after board placed modA in order to improve safety and functional independence with transfers with decreased caregiver assist. Baseline: MaxA; pt remains a maxA, intermittently +2 Goal status: NOT MET  ASSESSMENT:   CLINICAL IMPRESSION: Reviewed and condensed HEP last session to promote ease of transition of care to maintenance at home with caregiver or aide.  Further discussion of benefit of functional tasks with patient participation as able with caregivers to help maintain gains from episode of therapy.  Pt remains an intermittent mod-maxA +2 w/ sliding board transfer.  She continues to be limited in LE engagement to transfers and progression to standing regardless of DME used due to ongoing bilateral knee pain, right worse than left.  She can successfully reach to approximately 8" from floor in wheelchair with only supervision.  Overall her tolerance to activity has greatly improved as well as her safety  in sitting still requiring intermittent posterior trunk support.  At this time she has maximized her functional gains with outpatient rehab and would most benefit from transition to maintenance of gains in the home thus patient is being discharged today.  Daughter and patient are in agreement to plan at this time.     OBJECTIVE IMPAIRMENTS Abnormal gait, decreased balance, decreased knowledge of use of DME, decreased mobility, decreased ROM, decreased strength, dizziness, increased edema, impaired UE functional use, and pain.    ACTIVITY LIMITATIONS cleaning, community activity, driving, meal prep, laundry, medication management, yard work, and shopping.    PERSONAL FACTORS Age, Time since onset of injury/illness/exacerbation, and 3+ comorbidities: hypertension, cervical DDD, bilateral DVTs admitted on 01/05/2021 for bilateral hand weakness, numbness and the left leg weakness  are also affecting patient's functional outcome.      REHAB POTENTIAL: Fair     CLINICAL DECISION MAKING: Evolving/moderate complexity   EVALUATION COMPLEXITY: Moderate   PLAN: PT FREQUENCY: 2x/week for 4 weeks followed by 1x/week for 4 weeks   PT DURATION: 8 weeks   PLANNED INTERVENTIONS: Therapeutic exercises, Therapeutic activity, Neuromuscular re-education, Balance training, Gait training, Patient/Family education, Joint mobilization, Vestibular training, Canalith repositioning, Orthotic/Fit training, DME instructions, Wheelchair mobility training, Cryotherapy, Moist heat, and Manual therapy   PLAN FOR NEXT SESSION:   N/A  Elease Etienne, PT, DPT

## 2021-11-29 ENCOUNTER — Encounter: Payer: Medicare Other | Admitting: Occupational Therapy

## 2021-11-29 ENCOUNTER — Ambulatory Visit: Payer: Medicare Other | Admitting: Physical Therapy

## 2022-01-21 ENCOUNTER — Observation Stay (HOSPITAL_COMMUNITY)
Admission: EM | Admit: 2022-01-21 | Discharge: 2022-01-24 | Disposition: A | Discharge status: Home / Self Care | Payer: Medicare Other | Attending: Internal Medicine | Admitting: Internal Medicine

## 2022-01-21 ENCOUNTER — Encounter (HOSPITAL_COMMUNITY): Payer: Self-pay

## 2022-01-21 ENCOUNTER — Emergency Department (HOSPITAL_COMMUNITY): Payer: Medicare Other

## 2022-01-21 DIAGNOSIS — Z20822 Contact with and (suspected) exposure to covid-19: Secondary | ICD-10-CM | POA: Diagnosis not present

## 2022-01-21 DIAGNOSIS — I10 Essential (primary) hypertension: Secondary | ICD-10-CM | POA: Diagnosis not present

## 2022-01-21 DIAGNOSIS — R195 Other fecal abnormalities: Secondary | ICD-10-CM | POA: Diagnosis not present

## 2022-01-21 DIAGNOSIS — K921 Melena: Secondary | ICD-10-CM | POA: Diagnosis not present

## 2022-01-21 DIAGNOSIS — D1809 Hemangioma of other sites: Secondary | ICD-10-CM | POA: Diagnosis not present

## 2022-01-21 DIAGNOSIS — N179 Acute kidney failure, unspecified: Secondary | ICD-10-CM | POA: Diagnosis not present

## 2022-01-21 DIAGNOSIS — K8020 Calculus of gallbladder without cholecystitis without obstruction: Secondary | ICD-10-CM | POA: Insufficient documentation

## 2022-01-21 DIAGNOSIS — Z79899 Other long term (current) drug therapy: Secondary | ICD-10-CM | POA: Insufficient documentation

## 2022-01-21 DIAGNOSIS — Z96641 Presence of right artificial hip joint: Secondary | ICD-10-CM | POA: Insufficient documentation

## 2022-01-21 DIAGNOSIS — R197 Diarrhea, unspecified: Secondary | ICD-10-CM | POA: Diagnosis present

## 2022-01-21 DIAGNOSIS — Z86718 Personal history of other venous thrombosis and embolism: Secondary | ICD-10-CM | POA: Insufficient documentation

## 2022-01-21 DIAGNOSIS — D649 Anemia, unspecified: Secondary | ICD-10-CM | POA: Diagnosis present

## 2022-01-21 DIAGNOSIS — I639 Cerebral infarction, unspecified: Secondary | ICD-10-CM | POA: Diagnosis present

## 2022-01-21 DIAGNOSIS — R319 Hematuria, unspecified: Secondary | ICD-10-CM | POA: Diagnosis not present

## 2022-01-21 DIAGNOSIS — E669 Obesity, unspecified: Secondary | ICD-10-CM | POA: Insufficient documentation

## 2022-01-21 DIAGNOSIS — Z6841 Body Mass Index (BMI) 40.0 and over, adult: Secondary | ICD-10-CM | POA: Diagnosis not present

## 2022-01-21 DIAGNOSIS — I69359 Hemiplegia and hemiparesis following cerebral infarction affecting unspecified side: Secondary | ICD-10-CM

## 2022-01-21 DIAGNOSIS — F17210 Nicotine dependence, cigarettes, uncomplicated: Secondary | ICD-10-CM | POA: Insufficient documentation

## 2022-01-21 DIAGNOSIS — Z8673 Personal history of transient ischemic attack (TIA), and cerebral infarction without residual deficits: Secondary | ICD-10-CM | POA: Insufficient documentation

## 2022-01-21 DIAGNOSIS — Z7901 Long term (current) use of anticoagulants: Secondary | ICD-10-CM | POA: Diagnosis not present

## 2022-01-21 LAB — URINALYSIS, ROUTINE W REFLEX MICROSCOPIC
Bilirubin Urine: NEGATIVE
Glucose, UA: NEGATIVE mg/dL
Ketones, ur: NEGATIVE mg/dL
Nitrite: NEGATIVE
Protein, ur: NEGATIVE mg/dL
RBC / HPF: >50 RBC/hpf — ABNORMAL HIGH (ref 0–5)
Specific Gravity, Urine: 1.021 (ref 1.005–1.030)
pH: 5 (ref 5.0–8.0)

## 2022-01-21 LAB — COMPREHENSIVE METABOLIC PANEL
ALT: 19 U/L (ref 0–44)
AST: 26 U/L (ref 15–41)
Albumin: 3.2 g/dL — ABNORMAL LOW (ref 3.5–5.0)
Alkaline Phosphatase: 82 U/L (ref 38–126)
Anion gap: 9 (ref 5–15)
BUN: 15 mg/dL (ref 8–23)
CO2: 22 mmol/L (ref 22–32)
Calcium: 9.1 mg/dL (ref 8.9–10.3)
Chloride: 107 mmol/L (ref 98–111)
Creatinine, Ser: 1.05 mg/dL — ABNORMAL HIGH (ref 0.44–1.00)
GFR, Estimated: 50 mL/min — ABNORMAL LOW (ref >60)
Glucose, Bld: 106 mg/dL — ABNORMAL HIGH (ref 70–99)
Potassium: 4 mmol/L (ref 3.5–5.1)
Sodium: 138 mmol/L (ref 135–145)
Total Bilirubin: 0.9 mg/dL (ref 0.3–1.2)
Total Protein: 7 g/dL (ref 6.5–8.1)

## 2022-01-21 LAB — CBC WITH DIFFERENTIAL/PLATELET
Abs Immature Granulocytes: 0.13 10*3/uL — ABNORMAL HIGH (ref 0.00–0.07)
Basophils Absolute: 0.1 10*3/uL (ref 0.0–0.1)
Basophils Relative: 1 %
Eosinophils Absolute: 0.2 10*3/uL (ref 0.0–0.5)
Eosinophils Relative: 2 %
HCT: 35.7 % — ABNORMAL LOW (ref 36.0–46.0)
Hemoglobin: 11.2 g/dL — ABNORMAL LOW (ref 12.0–15.0)
Immature Granulocytes: 1 %
Lymphocytes Relative: 21 %
Lymphs Abs: 2 10*3/uL (ref 0.7–4.0)
MCH: 27.3 pg (ref 26.0–34.0)
MCHC: 31.4 g/dL (ref 30.0–36.0)
MCV: 87.1 fL (ref 80.0–100.0)
Monocytes Absolute: 0.7 10*3/uL (ref 0.1–1.0)
Monocytes Relative: 8 %
Neutro Abs: 6.5 10*3/uL (ref 1.7–7.7)
Neutrophils Relative %: 67 %
Platelets: UNDETERMINED 10*3/uL (ref 150–400)
RBC: 4.1 MIL/uL (ref 3.87–5.11)
RDW: 18.5 % — ABNORMAL HIGH (ref 11.5–15.5)
WBC: 9.7 10*3/uL (ref 4.0–10.5)
nRBC: 0 % (ref 0.0–0.2)

## 2022-01-21 LAB — C DIFFICILE QUICK SCREEN W PCR REFLEX
C Diff antigen: NEGATIVE
C Diff interpretation: NOT DETECTED
C Diff toxin: NEGATIVE

## 2022-01-21 LAB — SARS CORONAVIRUS 2 BY RT PCR: SARS Coronavirus 2 by RT PCR: NEGATIVE

## 2022-01-21 LAB — LIPASE, BLOOD: Lipase: 24 U/L (ref 11–51)

## 2022-01-21 LAB — POC OCCULT BLOOD, ED: Fecal Occult Bld: POSITIVE — AB

## 2022-01-21 MED ORDER — LOPERAMIDE HCL 2 MG PO CAPS: 4.0000 mg | ORAL_CAPSULE | Freq: Once | ORAL | Status: DC

## 2022-01-21 MED ORDER — ONDANSETRON HCL 4 MG PO TABS: 4.0000 mg | ORAL_TABLET | Freq: Four times a day (QID) | ORAL | Status: DC | PRN

## 2022-01-21 MED ORDER — ACETAMINOPHEN 650 MG RE SUPP: 650.0000 mg | Freq: Four times a day (QID) | RECTAL | Status: DC | PRN

## 2022-01-21 MED ORDER — MECLIZINE HCL 25 MG PO TABS: 25.0000 mg | ORAL_TABLET | Freq: Three times a day (TID) | ORAL | Status: DC | PRN

## 2022-01-21 MED ORDER — PANTOPRAZOLE INFUSION (NEW) - SIMPLE MED
8.0000 mg/h | INTRAVENOUS | Status: DC
  Administered 2022-01-21 – 2022-01-22 (×2): 8 mg/h via INTRAVENOUS
  Filled 2022-01-21: qty 100
  Filled 2022-01-21: qty 80
  Filled 2022-01-21 (×3): qty 100

## 2022-01-21 MED ORDER — PANTOPRAZOLE 80MG IVPB - SIMPLE MED
80.0000 mg | Freq: Once | INTRAVENOUS | Status: AC
  Administered 2022-01-21: 80 mg via INTRAVENOUS
  Filled 2022-01-21: qty 100

## 2022-01-21 MED ORDER — LOPERAMIDE HCL 2 MG PO CAPS: 2.0000 mg | ORAL_CAPSULE | ORAL | Status: DC | PRN

## 2022-01-21 MED ORDER — IOHEXOL 300 MG/ML  SOLN
80.0000 mL | Freq: Once | INTRAMUSCULAR | Status: AC | PRN
  Administered 2022-01-21: 80 mL via INTRAVENOUS

## 2022-01-21 MED ORDER — ALBUTEROL SULFATE (2.5 MG/3ML) 0.083% IN NEBU: 3.0000 mL | INHALATION_SOLUTION | Freq: Four times a day (QID) | RESPIRATORY_TRACT | Status: DC | PRN

## 2022-01-21 MED ORDER — PREGABALIN 50 MG PO CAPS
100.0000 mg | ORAL_CAPSULE | Freq: Three times a day (TID) | ORAL | Status: DC
  Administered 2022-01-22 – 2022-01-24 (×5): 100 mg via ORAL
  Filled 2022-01-21 (×6): qty 2

## 2022-01-21 MED ORDER — HYDRALAZINE HCL 20 MG/ML IJ SOLN: 5.0000 mg | INTRAMUSCULAR | Status: DC | PRN

## 2022-01-21 MED ORDER — RISAQUAD PO CAPS
2.0000 | ORAL_CAPSULE | Freq: Three times a day (TID) | ORAL | Status: DC
  Administered 2022-01-22 – 2022-01-24 (×7): 2 via ORAL
  Filled 2022-01-21 (×9): qty 2

## 2022-01-21 MED ORDER — ACETAMINOPHEN 325 MG PO TABS: 650.0000 mg | ORAL_TABLET | Freq: Four times a day (QID) | ORAL | Status: DC | PRN

## 2022-01-21 MED ORDER — LOSARTAN POTASSIUM 50 MG PO TABS
25.0000 mg | ORAL_TABLET | Freq: Every day | ORAL | Status: DC
  Administered 2022-01-22 – 2022-01-24 (×3): 25 mg via ORAL
  Filled 2022-01-21 (×3): qty 1

## 2022-01-21 MED ORDER — ONDANSETRON HCL 4 MG/2ML IJ SOLN: 4.0000 mg | Freq: Four times a day (QID) | INTRAMUSCULAR | Status: DC | PRN

## 2022-01-21 MED ORDER — SODIUM CHLORIDE 0.9 % IV BOLUS
1000.0000 mL | Freq: Once | INTRAVENOUS | Status: AC
  Administered 2022-01-21: 1000 mL via INTRAVENOUS

## 2022-01-21 NOTE — ED Provider Notes (Signed)
[facility] DEPT Provider Note   CSN: [id number] Arrival date & time: 01/21/22  1326     History  Chief Complaint  Patient presents with   Diarrhea    [name] is a 88 y.o. female.  Pt is a 88 yo female with a pmhx significant for CVA, HTN, DVT (on Eliquis), and depression.  Pt lives at home.  She has had diarrhea for 2 days.  She was given imodium at home which has not helped.  Family describes the amount of diarrhea as an "ocean."  Pt denies any pain currently, but has pain and bloating prior to a diarrhea episode.  Pt has not had any fevers.  Pt did finish an abx for UTI recently.       Home Medications Prior to Admission medications   Medication Sig Start Date End Date Taking? Authorizing Provider  albuterol (VENTOLIN HFA) 108 (90 Base) MCG/ACT inhaler Inhale 1-2 puffs into the lungs every 6 (six) hours as needed for wheezing or shortness of breath.   Yes [provider]  apixaban (ELIQUIS) 5 MG TABS tablet Take 5 mg by mouth 2 (two) times daily.   Yes [provider]  atorvastatin (LIPITOR) 20 MG tablet Take 1 tablet (20 mg total) by mouth daily. Patient taking differently: Take 20 mg by mouth at bedtime. 01/07/21  Yes Dennison Mascot, PA-C  diclofenac sodium (VOLTAREN) 1 % GEL Apply 1 application topically 4 (four) times daily as needed (knee and hand pain).   Yes [provider]  escitalopram (LEXAPRO) 10 MG tablet Take 10 mg by mouth at bedtime. 05/25/21  Yes [provider]  losartan (COZAAR) 25 MG tablet Take 25 mg by mouth daily. 11/04/21  Yes [provider]  meclizine (ANTIVERT) 25 MG tablet Take 25 mg by mouth 3 (three) times daily as needed for dizziness. As needed for vertigo   Yes [provider]  pregabalin (LYRICA) 100 MG capsule Take 100 mg by mouth 3 (three) times daily. 05/31/21  Yes [provider]      Allergies    Codeine and Penicillins    Review of  Systems   Review of Systems  Gastrointestinal:  Positive for diarrhea.  All other systems reviewed and are negative.   Physical Exam Updated Vital Signs BP (!) 146/67   Pulse 74   Temp 98.7 F (37.1 C)   Resp 16   SpO2 96%  Physical Exam Vitals and nursing note reviewed.  Constitutional:      Appearance: Normal appearance.  HENT:     Head: Normocephalic and atraumatic.     Right Ear: External ear normal.     Left Ear: External ear normal.     Nose: Nose normal.     Mouth/Throat:     Mouth: Mucous membranes are dry.  Eyes:     Extraocular Movements: Extraocular movements intact.     Conjunctiva/sclera: Conjunctivae normal.     Pupils: Pupils are equal, round, and reactive to light.  Cardiovascular:     Rate and Rhythm: Normal rate and regular rhythm.     Pulses: Normal pulses.     Heart sounds: Normal heart sounds.  Pulmonary:     Effort: Pulmonary effort is normal.     Breath sounds: Normal breath sounds.  Abdominal:     General: Abdomen is flat. Bowel sounds are normal.     Palpations: Abdomen is soft.  Musculoskeletal:        General:  Normal range of motion.     Cervical back: Normal range of motion and neck supple.  Skin:    General: Skin is warm.     Capillary Refill: Capillary refill takes less than 2 seconds.  Neurological:     Mental Status: She is alert and oriented to person, place, and time.     Comments: Left sided paralysis secondary to stroke last year  Psychiatric:        Mood and Affect: Mood normal.     ED Results / Procedures / Treatments   Labs (all labs ordered are listed, but only abnormal results are displayed) Labs Reviewed  CBC WITH DIFFERENTIAL/PLATELET - Abnormal; Notable for the following components:      Result Value   Hemoglobin 11.2 (*)    HCT 35.7 (*)    RDW 18.5 (*)    Abs Immature Granulocytes 0.13 (*)    All other components within normal limits  COMPREHENSIVE METABOLIC PANEL - Abnormal; Notable for the following  components:   Glucose, Bld 106 (*)    Creatinine, Ser 1.05 (*)    Albumin 3.2 (*)    GFR, Estimated 50 (*)    All other components within normal limits  URINALYSIS, ROUTINE W REFLEX MICROSCOPIC - Abnormal; Notable for the following components:   APPearance HAZY (*)    Hgb urine dipstick LARGE (*)    Leukocytes,Ua TRACE (*)    RBC / HPF >50 (*)    Bacteria, UA MANY (*)    All other components within normal limits  POC OCCULT BLOOD, ED - Abnormal; Notable for the following components:   Fecal Occult Bld POSITIVE (*)    All other components within normal limits  SARS CORONAVIRUS 2 BY RT PCR  C DIFFICILE QUICK SCREEN W PCR REFLEX    GASTROINTESTINAL PANEL BY PCR, STOOL (REPLACES STOOL CULTURE)  LIPASE, BLOOD  VITAMIN B12  FOLATE  IRON AND TIBC  FERRITIN  RETICULOCYTES  PROTIME-INR  TYPE AND SCREEN    EKG None  Radiology CT ABDOMEN PELVIS W CONTRAST  Result Date: 01/21/2022 CLINICAL DATA:  Diarrhea for the past 2 days. Left-sided hemiplegia. Large amount of non-bloody diarrhea for the past 2 days. EXAM: CT ABDOMEN AND PELVIS WITH CONTRAST TECHNIQUE: Multidetector CT imaging of the abdomen and pelvis was performed using the standard protocol following bolus administration of intravenous contrast. RADIATION DOSE REDUCTION: This exam was performed according to the departmental dose-optimization program which includes automated exposure control, adjustment of the mA and/or kV according to patient size and/or use of iterative reconstruction technique. CONTRAST:  56m OMNIPAQUE IOHEXOL 300 MG/ML  SOLN COMPARISON:  None Available. FINDINGS: Lower chest: Unremarkable. Hepatobiliary: The lateral segment of the left lobe of the liver is enlarged by large mass with central low density and peripheral contrast puddling. This measures 12.1 x 9.2 cm on image number 20/2. There is a smaller similar-appearing mass in the posterior right lobe of the liver, measuring 2.0 cm in maximum diameter on image  number 24/2. Multiple gallstones in the gallbladder. The largest measures 1.1 cm in maximum diameter. No gallbladder wall thickening or pericholecystic fluid. Pancreas: Moderate to marked diffuse pancreatic atrophy. Spleen: Normal in size without focal abnormality. Adrenals/Urinary Tract: Normal appearing adrenal glands. Elongated upper pole right renal calculus measuring 1.1 cm on coronal image number 95/6. Similar-appearing calculus in the upper pole of the left kidney measuring 9 mm on image number 154/7. No hydronephrosis on either side. Unremarkable ureters and urinary bladder. Stomach/Bowel: Small hiatal  hernia. Large number of colonic diverticula large number of diverticula throughout the majority of the colon without evidence of diverticulitis. Unremarkable small bowel. No evidence of appendicitis. Vascular/Lymphatic: Atheromatous arterial calcifications without aneurysm. No enlarged lymph nodes. Inferior vena cava filter with its tip inferior to the renal veins. Reproductive: Status post hysterectomy. No adnexal masses. Other: Small amount of free peritoneal fluid in the pelvis. Musculoskeletal: Right hip prosthesis. Lumbar and lower thoracic spine degenerative changes. IMPRESSION: 1. No acute abnormality. 2. Large mass in the lateral segment of the left lobe of the liver with a smaller similar-appearing mass in the posterior right lobe of the liver. These are compatible with hemangiomas. 3. Cholelithiasis without evidence of cholecystitis. 4. Extensive colonic diverticulosis. 5. Small hiatal hernia. 6. Bilateral nonobstructing renal calculi. 7. Small amount of free peritoneal fluid in the pelvis. Electronically Signed   By: Claudie Revering M.D.   On: 01/21/2022 20:08   DG Chest Portable 1 View  Result Date: 01/21/2022 CLINICAL DATA:  Diarrhea and weakness for several days. EXAM: PORTABLE CHEST 1 VIEW COMPARISON:  01/25/2021 FINDINGS: The heart size and mediastinal contours are within normal limits. Both  lungs are clear. The visualized skeletal structures are unremarkable. IMPRESSION: No active disease. Electronically Signed   By: Marlaine Hind M.D.   On: 01/21/2022 16:26    Procedures Procedures    Medications Ordered in ED Medications  pantoprazole (PROTONIX) 80 mg /NS 100 mL IVPB (has no administration in time range)  pantoprozole (PROTONIX) 80 mg /NS 100 mL infusion (has no administration in time range)  sodium chloride 0.9 % bolus 1,000 mL (0 mLs Intravenous Stopped 01/21/22 2001)  iohexol (OMNIPAQUE) 300 MG/ML solution 80 mL (80 mLs Intravenous Contrast Given 01/21/22 1939)    ED Course/ Medical Decision Making/ A&P                           Medical Decision Making Amount and/or Complexity of Data Reviewed Labs: ordered. Radiology: ordered.  Risk Prescription drug management. Decision regarding hospitalization.   This patient presents to the ED for concern of diarrhea, this involves an extensive number of treatment options, and is a complaint that carries with it a high risk of complications and morbidity.  The differential diagnosis includes bacterial, viral, colitis   Co morbidities that complicate the patient evaluation  CVA, HTN, DVT (on Eliquis), and depression   Additional history obtained:  Additional history obtained from epic chart review External records from outside source obtained and reviewed including family   Lab Tests:  I Ordered, and personally interpreted labs.  The pertinent results include:  ua + lg hgb, cbc with nl wbc, hgb 11.2 (hgb 14.3 1 year ago)   Imaging Studies ordered:  I ordered imaging studies including ct abd/pelvis and cxr  I independently visualized and interpreted imaging which showed  CXR: IMPRESSION:  No active disease.  CT abd/pelvis: IMPRESSION:  1. No acute abnormality.  2. Large mass in the lateral segment of the left lobe of the liver  with a smaller similar-appearing mass in the posterior right lobe of  the liver.  These are compatible with hemangiomas.  3. Cholelithiasis without evidence of cholecystitis.  4. Extensive colonic diverticulosis.  5. Small hiatal hernia.  6. Bilateral nonobstructing renal calculi.  7. Small amount of free peritoneal fluid in the pelvis.   I agree with the radiologist interpretation   Cardiac Monitoring:  The patient was maintained on a cardiac monitor.  I personally viewed and interpreted the cardiac monitored which showed an underlying rhythm of: nsr   Medicines ordered and prescription drug management:  I ordered medication including protonix  for gi bleed  Reevaluation of the patient after these medicines showed that the patient improved I have reviewed the patients home medicines and have made adjustments as needed   Test Considered:  ct   Critical Interventions:  ct   Consultations Obtained:  I requested consultation with the hospitalist (Dr. Claria Dice,  and discussed lab and imaging findings as well as pertinent plan -she will admit   Problem List / ED Course:  Diarrhea:  c.diff neg.  CT neg for colitis.  Stool panel pending.  Pt is guaiac + and hgb has dropped 3 grams in 1 year.  She is on Eliquis and took her dose tonight.  Pt started on protonix.     Reevaluation:  After the interventions noted above, I reevaluated the patient and found that they have :improved   Social Determinants of Health:  Lives at home with daughter   Dispostion:  After consideration of the diagnostic results and the patients response to treatment, I feel that the patent would benefit from admission.          Final Clinical Impression(s) / ED Diagnoses Final diagnoses:  Diarrhea, unspecified type  On apixaban therapy  Hematuria, unspecified type  Anemia, unspecified type    Rx / DC Orders ED Discharge Orders     None         Isla Pence, MD 01/21/22 2236

## 2022-01-21 NOTE — H&P (Addendum)
PCP:   Nolene Ebbs, MD    Chief Complaint:  Diarrhea  HPI: This is a 88 year old female who lives at home with her daughter.  She has a prior history of CVA with left-sided hemiplegia.  Patient is bedbound.  She lives with her daughter.  Three days ago she developed diarrhea, per patient and daughter her diarrhea is copious and continuous.  She had diarrhea every hour and on awakening she will be in the puddle of liquid feces.  Today she developed abdominal discomfort.  She states is not severe it is more of an ache but its bothersome.  She took Imodium but states she only took 2 pills had no effect prior to coming to the ER.  Approximately 1 month ago patient had a UR, right at that she is a UTI.  Both were treated with antibiotics.  The patient denies blood in her stool. She denies fever, chills, nausea, vomiting.  She denies being lightheaded or dizzy.  Per patient she has decreased appetite which has been pushing fluids.  The patient has a history of blood clots and has IVC filter in place.  She is on Eliquis for her blood clots and stroke.  The patient does not eat spicy foods, she does not take NSAIDs and she denies any history of GERD.  Her daughter took her to the ER due to her significant diarrhea.  In the ER, patient's occult stool positive.  Hospitalist have been asked admit.  History provided by the patient was alert oriented.   Review of Systems:  Per HPI.  The patient denies anorexia, fever, weight loss,, vision loss, decreased hearing, hoarseness, chest pain, syncope, dyspnea on exertion, peripheral edema, balance deficits, hemoptysis, abdominal pain, melena, hematochezia, severe indigestion/heartburn, hematuria, incontinence, genital sores, muscle weakness, suspicious skin lesions, transient blindness, difficulty walking, depression, unusual weight change, abnormal bleeding, enlarged lymph nodes, angioedema, and breast masses.  Past Medical History: Past Medical History:   Diagnosis Date   Allergic rhinitis    Arthritis    Carpal tunnel syndrome    Deep venous thrombosis (HCC)    bilateral legs   Degenerative arthritis    right knee   Hypertension    Lumbar degenerative disc disease    Lump or mass in breast    Paresthesia    Syncope 05/24/2013   Vitamin D deficiency    Past Surgical History:  Procedure Laterality Date   ABDOMINAL HYSTERECTOMY     CARPAL TUNNEL RELEASE Right    CATARACT EXTRACTION Bilateral    HEMORROIDECTOMY     IVC Filter     TOTAL HIP ARTHROPLASTY Right    ULNAR NERVE TRANSPOSITION Right     Medications: Prior to Admission medications   Medication Sig Start Date End Date Taking? Authorizing Provider  albuterol (VENTOLIN HFA) 108 (90 Base) MCG/ACT inhaler Inhale 1-2 puffs into the lungs every 6 (six) hours as needed for wheezing or shortness of breath.   Yes [provider]  apixaban (ELIQUIS) 5 MG TABS tablet Take 5 mg by mouth 2 (two) times daily.   Yes [provider]  atorvastatin (LIPITOR) 20 MG tablet Take 1 tablet (20 mg total) by mouth daily. Patient taking differently: Take 20 mg by mouth at bedtime. 01/07/21  Yes Dennison Mascot, PA-C  diclofenac sodium (VOLTAREN) 1 % GEL Apply 1 application topically 4 (four) times daily as needed (knee and hand pain).   Yes [provider]  escitalopram (LEXAPRO) 10 MG tablet Take 10 mg by  mouth at bedtime. 05/25/21  Yes [provider]  losartan (COZAAR) 25 MG tablet Take 25 mg by mouth daily. 11/04/21  Yes [provider]  meclizine (ANTIVERT) 25 MG tablet Take 25 mg by mouth 3 (three) times daily as needed for dizziness. As needed for vertigo   Yes [provider]  pregabalin (LYRICA) 100 MG capsule Take 100 mg by mouth 3 (three) times daily. 05/31/21  Yes [provider]    Allergies:   Allergies  Allergen Reactions   Codeine Other (See Comments)    Passed out   Penicillins Other (See Comments)    Passed  out Has patient had a PCN reaction causing immediate rash, facial/tongue/throat swelling, SOB or lightheadedness with hypotension: Yes Has patient had a PCN reaction causing severe rash involving mucus membranes or skin necrosis: Unk Has patient had a PCN reaction that required hospitalization: No Has patient had a PCN reaction occurring within the last 10 years: No If all of the above answers are "NO", then may proceed with Cephalosporin use.     Social History:  reports a h/o tobacco use. She has a 300.00 pack-year smoking history. She has never used smokeless tobacco. She deniescurrent alcohol use. She reports that she does not use drugs.  Family History: Family History  Problem Relation Age of Onset   Diabetes Mother    Kidney failure Father    Cancer - Lung Brother    Cancer - Prostate Brother    Kidney failure Son     Physical Exam: Vitals:   01/21/22 2100 01/21/22 2145 01/21/22 2157 01/21/22 2200  BP: (!) 154/71   (!) 146/67  Pulse: 75 75  74  Resp: 16   16  Temp:   98.7 F (37.1 C)   TempSrc:      SpO2: 97% 97%  96%    General:  Alert and oriented times three, well developed and nourished, no acute distress Eyes: PERRLA, pink conjunctiva, no scleral icterus ENT: Moist oral mucosa, neck supple, no thyromegaly Lungs: clear to ascultation, no wheeze, no crackles, no use of accessory muscles Cardiovascular: regular rate and rhythm, no regurgitation, no gallops, no murmurs. No carotid bruits, no JVD Abdomen: soft, positive BS, non-tender, non-distended, no organomegaly, not an acute abdomen GU: not examined Neuro: CN II - XII grossly intact, sensation intact Musculoskeletal: RUE/RLE paralysis. RLE >LLE, +1 B/L pitting edema Skin: no rash, no subcutaneous crepitation, no decubitus Psych: appropriate patient   Labs on Admission:  Recent Labs    01/21/22 1543  NA 138  K 4.0  CL 107  CO2 22  GLUCOSE 106*  BUN 15  CREATININE 1.05*  CALCIUM 9.1   Recent Labs     01/21/22 1543  AST 26  ALT 19  ALKPHOS 82  BILITOT 0.9  PROT 7.0  ALBUMIN 3.2*   Recent Labs    01/21/22 1543  LIPASE 24   Recent Labs    01/21/22 1543  WBC 9.7  NEUTROABS 6.5  HGB 11.2*  HCT 35.7*  MCV 87.1  PLT PLATELET CLUMPS NOTED ON SMEAR, UNABLE TO ESTIMATE   No results for input(s): "CKTOTAL", "CKMB", "CKMBINDEX", "TROPONINI" in the last 72 hours. Invalid input(s): "POCBNP" No results for input(s): "DDIMER" in the last 72 hours. No results for input(s): "HGBA1C" in the last 72 hours. No results for input(s): "CHOL", "HDL", "LDLCALC", "TRIG", "CHOLHDL", "LDLDIRECT" in the last 72 hours. No results for input(s): "TSH", "T4TOTAL", "T3FREE", "THYROIDAB" in the last 72 hours.  Invalid  input(s): "FREET3" No results for input(s): "VITAMINB12", "FOLATE", "FERRITIN", "TIBC", "IRON", "RETICCTPCT" in the last 72 hours.  Micro Results: Recent Results (from the past 240 hour(s))  SARS Coronavirus 2 by RT PCR (hospital order, performed in [facility] hospital lab) *cepheid single result test* Anterior Nasal Swab     Status: None   Collection Time: 01/21/22  7:16 PM   Specimen: Anterior Nasal Swab  Result Value Ref Range Status   SARS Coronavirus 2 by RT PCR NEGATIVE NEGATIVE Final    Comment: (NOTE) SARS-CoV-2 target nucleic acids are NOT DETECTED.  The SARS-CoV-2 RNA is generally detectable in upper and lower respiratory specimens during the acute phase of infection. The lowest concentration of SARS-CoV-2 viral copies this assay can detect is 250 copies / mL. A negative result does not preclude SARS-CoV-2 infection and should not be used as the sole basis for treatment or other patient management decisions.  A negative result may occur with improper specimen collection / handling, submission of specimen other than nasopharyngeal swab, presence of viral mutation(s) within the areas targeted by this assay, and inadequate number of viral copies (<250 copies / mL). A  negative result must be combined with clinical observations, patient history, and epidemiological information.  Fact Sheet for Patients:   https://www.patel.info/  Fact Sheet for Healthcare Providers: https://hall.com/  This test is not yet approved or  cleared by the [place] FDA and has been authorized for detection and/or diagnosis of SARS-CoV-2 by FDA under an Emergency Use Authorization (EUA).  This EUA will remain in effect (meaning this test can be used) for the duration of the COVID-19 declaration under Section 564(b)(1) of the Act, 21 U.S.C. section 360bbb-3(b)(1), unless the authorization is terminated or revoked sooner.  Performed at [facility], [place] [address] [place], [place] [address]   C Difficile Quick Screen w PCR reflex     Status: None   Collection Time: 01/21/22  8:18 PM  Result Value Ref Range Status   C Diff antigen NEGATIVE NEGATIVE Final   C Diff toxin NEGATIVE NEGATIVE Final   C Diff interpretation No C. difficile detected.  Final    Comment: Performed at [facility], [place] [address] [place], [place] [address]     Radiological Exams on Admission: CT ABDOMEN PELVIS W CONTRAST  Result Date: 01/21/2022 CLINICAL DATA:  Diarrhea for the past 2 days. Left-sided hemiplegia. Large amount of non-bloody diarrhea for the past 2 days. EXAM: CT ABDOMEN AND PELVIS WITH CONTRAST TECHNIQUE: Multidetector CT imaging of the abdomen and pelvis was performed using the standard protocol following bolus administration of intravenous contrast. RADIATION DOSE REDUCTION: This exam was performed according to the departmental dose-optimization program which includes automated exposure control, adjustment of the mA and/or kV according to patient size and/or use of iterative reconstruction technique. CONTRAST:  75m OMNIPAQUE IOHEXOL 300 MG/ML  SOLN COMPARISON:  None Available. FINDINGS: Lower chest:  Unremarkable. Hepatobiliary: The lateral segment of the left lobe of the liver is enlarged by large mass with central low density and peripheral contrast puddling. This measures 12.1 x 9.2 cm on image number 20/2. There is a smaller similar-appearing mass in the posterior right lobe of the liver, measuring 2.0 cm in maximum diameter on image number 24/2. Multiple gallstones in the gallbladder. The largest measures 1.1 cm in maximum diameter. No gallbladder wall thickening or pericholecystic fluid. Pancreas: Moderate to marked diffuse pancreatic atrophy. Spleen: Normal in size without focal abnormality. Adrenals/Urinary Tract: Normal appearing adrenal  glands. Elongated upper pole right renal calculus measuring 1.1 cm on coronal image number 95/6. Similar-appearing calculus in the upper pole of the left kidney measuring 9 mm on image number 154/7. No hydronephrosis on either side. Unremarkable ureters and urinary bladder. Stomach/Bowel: Small hiatal hernia. Large number of colonic diverticula large number of diverticula throughout the majority of the colon without evidence of diverticulitis. Unremarkable small bowel. No evidence of appendicitis. Vascular/Lymphatic: Atheromatous arterial calcifications without aneurysm. No enlarged lymph nodes. Inferior vena cava filter with its tip inferior to the renal veins. Reproductive: Status post hysterectomy. No adnexal masses. Other: Small amount of free peritoneal fluid in the pelvis. Musculoskeletal: Right hip prosthesis. Lumbar and lower thoracic spine degenerative changes. IMPRESSION: 1. No acute abnormality. 2. Large mass in the lateral segment of the left lobe of the liver with a smaller similar-appearing mass in the posterior right lobe of the liver. These are compatible with hemangiomas. 3. Cholelithiasis without evidence of cholecystitis. 4. Extensive colonic diverticulosis. 5. Small hiatal hernia. 6. Bilateral nonobstructing renal calculi. 7. Small amount of free  peritoneal fluid in the pelvis. Electronically Signed   By: Claudie Revering M.D.   On: 01/21/2022 20:08   DG Chest Portable 1 View  Result Date: 01/21/2022 CLINICAL DATA:  Diarrhea and weakness for several days. EXAM: PORTABLE CHEST 1 VIEW COMPARISON:  01/25/2021 FINDINGS: The heart size and mediastinal contours are within normal limits. Both lungs are clear. The visualized skeletal structures are unremarkable. IMPRESSION: No active disease. Electronically Signed   By: Marlaine Hind M.D.   On: 01/21/2022 16:26    Assessment/Plan Present on Admission:  Diarrhea, copious -Plan for overnight observation on MedSurg -C diff done and negative.  Culture pending -Schedule Imodium and IV fluids -probiotics initiated  Occult stool positive -Hemoglobin normal at 11.2. -Likely due to patient's diarrhea -Will order CEA. R/o colon cancer given patient older than 50years, occult stool positive, anemic with no recent colonoscopy. -H&H every 12 -hold eliquis, reevaluate in a.m. -Defer to a.m. team further work-up -CT abdomen/pelvis extensive diverticulitis  Possible hematuria -blood in UA maybe from stool cross contamination -Repeat UA, in and out cath  Peripheral neuropathy -Resume pregabalin   CVA (cerebral vascular accident) ([place]) -Resume losartan, first dose now   Essential hypertension -Stable, resume losartan   Code status: Full Code  Norma Montemurro 01/21/2022, 10:56 PM

## 2022-01-21 NOTE — ED Triage Notes (Signed)
Pt BIB [facility] from home for diarrhea x2 days, doctor said to come to ED. Family gave 2 immodium today.No other complaints. A&Ox4, non ambulatory at baseline d/t prior stroke. Afebrile.  136/62 HR 88 RR 20 95% RA

## 2022-01-21 NOTE — ED Provider Triage Note (Signed)
Emergency Medicine Provider Triage Evaluation Note  [name] , a 88 y.o. female  was evaluated in triage.  Pt complains of diarrhea for 2 days.  Patient is bedbound at baseline and has left-sided plegia.  Per caregiver she has had a ton of diarrhea over the past couple days has been nonbloody.  She is acting normal otherwise.  Apparently she has recently been on antibiotics for UTI but they are not sure what she was on  Review of Systems  Positive:  Negative:   Physical Exam  BP (!) 115/59 (BP Location: Right Arm)   Pulse 65   Temp 98.7 F (37.1 C) (Oral)   Resp 16   SpO2 95%  Gen:   Awake, no distress   Resp:  Normal effort  MSK:   Moves extremities without difficulty  Other:  Abd soft nontender  Medical Decision Making  Medically screening exam initiated at 2:29 PM.  Appropriate orders placed.  SHELVIA FOJTIK was informed that the remainder of the evaluation will be completed by another provider, this initial triage assessment does not replace that evaluation, and the importance of remaining in the ED until their evaluation is complete.     Adolphus Birchwood, PA-C 01/21/22 1431

## 2022-01-22 ENCOUNTER — Encounter (HOSPITAL_COMMUNITY): Payer: Self-pay | Admitting: Family Medicine

## 2022-01-22 ENCOUNTER — Other Ambulatory Visit: Payer: Self-pay

## 2022-01-22 DIAGNOSIS — R197 Diarrhea, unspecified: Secondary | ICD-10-CM | POA: Diagnosis not present

## 2022-01-22 DIAGNOSIS — I69359 Hemiplegia and hemiparesis following cerebral infarction affecting unspecified side: Secondary | ICD-10-CM

## 2022-01-22 DIAGNOSIS — R319 Hematuria, unspecified: Secondary | ICD-10-CM

## 2022-01-22 LAB — GASTROINTESTINAL PANEL BY PCR, STOOL (REPLACES STOOL CULTURE)

## 2022-01-22 LAB — RETICULOCYTES
Immature Retic Fract: 14.8 % (ref 2.3–15.9)
RBC.: 4.09 MIL/uL (ref 3.87–5.11)
Retic Count, Absolute: 58.1 10*3/uL (ref 19.0–186.0)
Retic Ct Pct: 1.4 % (ref 0.4–3.1)

## 2022-01-22 LAB — BASIC METABOLIC PANEL
Anion gap: 5 (ref 5–15)
BUN: 14 mg/dL (ref 8–23)
CO2: 24 mmol/L (ref 22–32)
Calcium: 8.9 mg/dL (ref 8.9–10.3)
Chloride: 108 mmol/L (ref 98–111)
Creatinine, Ser: 0.86 mg/dL (ref 0.44–1.00)
GFR, Estimated: >60 mL/min (ref >60)
Glucose, Bld: 108 mg/dL — ABNORMAL HIGH (ref 70–99)
Potassium: 3.9 mmol/L (ref 3.5–5.1)
Sodium: 137 mmol/L (ref 135–145)

## 2022-01-22 LAB — PROTIME-INR
INR: 1.4 — ABNORMAL HIGH (ref 0.8–1.2)
Prothrombin Time: 16.8 seconds — ABNORMAL HIGH (ref 11.4–15.2)

## 2022-01-22 LAB — CBC
HCT: 36.7 % (ref 36.0–46.0)
Hemoglobin: 11.3 g/dL — ABNORMAL LOW (ref 12.0–15.0)
MCH: 27.5 pg (ref 26.0–34.0)
MCHC: 30.8 g/dL (ref 30.0–36.0)
MCV: 89.3 fL (ref 80.0–100.0)
Platelets: 172 10*3/uL (ref 150–400)
RBC: 4.11 MIL/uL (ref 3.87–5.11)
RDW: 18.5 % — ABNORMAL HIGH (ref 11.5–15.5)
WBC: 9 10*3/uL (ref 4.0–10.5)
nRBC: 0 % (ref 0.0–0.2)

## 2022-01-22 LAB — TYPE AND SCREEN
ABO/RH(D): A POS
Antibody Screen: NEGATIVE

## 2022-01-22 LAB — IRON AND TIBC
Iron: 35 ug/dL (ref 28–170)
Saturation Ratios: 11 % (ref 10.4–31.8)
TIBC: 317 ug/dL (ref 250–450)
UIBC: 282 ug/dL

## 2022-01-22 LAB — ABO/RH: ABO/RH(D): A POS

## 2022-01-22 LAB — FOLATE: Folate: 9.6 ng/mL (ref >5.9)

## 2022-01-22 LAB — VITAMIN B12: Vitamin B-12: 361 pg/mL (ref 180–914)

## 2022-01-22 LAB — FERRITIN: Ferritin: 45 ng/mL (ref 11–307)

## 2022-01-22 MED ORDER — ORAL CARE MOUTH RINSE: 15.0000 mL | OROMUCOSAL | Status: DC | PRN

## 2022-01-22 MED ORDER — ATORVASTATIN CALCIUM 10 MG PO TABS
20.0000 mg | ORAL_TABLET | Freq: Every day | ORAL | Status: DC
  Administered 2022-01-22 – 2022-01-23 (×2): 20 mg via ORAL
  Filled 2022-01-22 (×2): qty 2

## 2022-01-22 MED ORDER — INFLUENZA VAC A&B SA ADJ QUAD 0.5 ML IM PRSY
0.5000 mL | PREFILLED_SYRINGE | INTRAMUSCULAR | Status: DC
  Filled 2022-01-22: qty 0.5

## 2022-01-22 MED ORDER — ESCITALOPRAM OXALATE 10 MG PO TABS
10.0000 mg | ORAL_TABLET | Freq: Every day | ORAL | Status: DC
  Administered 2022-01-22 – 2022-01-23 (×2): 10 mg via ORAL
  Filled 2022-01-22 (×2): qty 1

## 2022-01-22 MED ORDER — DICLOFENAC SODIUM 1 % EX GEL
4.0000 g | Freq: Four times a day (QID) | CUTANEOUS | Status: DC | PRN
  Administered 2022-01-22 – 2022-01-24 (×2): 4 g via TOPICAL
  Filled 2022-01-22: qty 100

## 2022-01-22 NOTE — Care Management CC44 (Signed)
Condition Code 44 Documentation Completed  Patient Details  Name: [name] MRN: [id number] Date of Birth: Aug 23, 1928   Condition Code 44 given:  Yes Patient signature on Condition Code 44 notice:  Yes Documentation of 2 MD's agreement:  Yes Code 44 added to claim:  Yes    Ross Ludwig, LCSW 01/22/2022, 1:39 PM

## 2022-01-22 NOTE — Progress Notes (Signed)
PROGRESS NOTE [name]  YSA:[id number] DOB: [birth year] DOA: 01/21/2022 PCP: Nolene Ebbs, MD   Brief Narrative/Hospital Course: 77-yof who lives at home with her daughter, has hx of prior CVA with left-sided hemiplegia, bedbound, history of blood clot, status post IVC filter, on Eliquis who developed diarrhea, placed on continuous x3 days, abdominal discomfort so came to the ED.Approximately 1 month ago patient was treated with antibiotics. denies blood in her stool,fever, chills, nausea, vomiting In the ED Hemoccult positive, lasted stable renal function and electrolytes and CBC.   UA with WBC 6-10 RBC more than 50 C Diff ordered and negative, GI panel pending.  Chest x-ray clear. CT abdomen large mass in lateral segment of left lobe of the liver, compatible with hemangiomas, cholelithiasis without cholecystitis, colonic diverticulosis, small hiatal hernia, bilateral renal calculi nonobstructing.  Admitted for further management   Subjective: Seen and examined this morning mildly confused Was having profuse diarrhea got cleaned and getting ready to put Flexi-Seal in-but no diarrhea since Patient is alert awake oriented to self place current president  Assessment and Plan: Principal Problem:   Diarrhea Active Problems:   CVA (cerebral vascular accident) ([place])   Essential hypertension   Acute kidney injury ([place])   Anemia   Occult blood positive stool   Hematuria   Hemiplegia as late effect of cerebral infarction ([place])   Copious Diarrhea with abdominal pain Hemoccult positive stool/diarrhea: C Diff neg,GI panel pending.  CT abdomen as above no acute finding. Hemoccult positive stool, and was placed on serial H&H, PPI drip,probiotics. Cont supportive care, consider GI eval.  Recent Labs  Lab 01/21/22 1543 01/22/22 0548  HGB 11.2* 11.3*  HCT 35.7* 36.7    Mild hematuria monitor  Mild delirium: Patient with stroke, she is at times forgetful at home and does get confused as  per the daughter, minimize psychotropic medication continue supportive care and hydration.  Hypertension-BP stable.  On losartan. History of previous CVA with left side hemiplegia/bedbound-on eliquis History of CVTA S/P  IVC filter: IF H&H remains stable and no further GI intervention and if okay with GI we will resume anticoagulation soon  Peripheral neuropathy on pregabalin Class III Obesity:Patient's Body mass index is 40.22 kg/m. : Will benefit with PCP follow-up, weight loss  healthy lifestyle and outpatient sleep evaluation.   DVT prophylaxis: SCDs Start: 01/21/22 2314 Code Status:   Code Status: Full Code Family Communication: plan of care discussed with patient/DAUGHTER UPDATED ON PHONE  Patient status is: Remains inpatient because of diarrhea and further management, hemoglobin monitoring with FOBT positive, Level of care: Med-Surg   Dispo: The patient is from: HOME             Anticipated disposition:  tbd  Mobility Assessment (last 72 hours)     Mobility Assessment     Row Name 01/22/22 0300           Does patient have an order for bedrest or is patient medically unstable No - Continue assessment       What is the highest level of mobility based on the progressive mobility assessment? Level 1 (Bedfast) - Unable to balance while sitting on edge of bed       Is the above level different from baseline mobility prior to current illness? No - Consider discontinuing PT/OT                 Objective: Vitals last 24 hrs: Vitals:   01/22/22 0331 01/22/22 0334 01/22/22 0749 01/22/22 1207  BP: (!) 145/61 (!) 145/61 (!) 134/57 135/60  Pulse: 70 70 67 64  Resp: '18 18 16 18  '$ Temp: 97.8 F (36.6 C) 97.8 F (36.6 C) 97.9 F (36.6 C) 97.8 F (36.6 C)  TempSrc: Oral Oral Oral Oral  SpO2: 95%  100% 100%  Weight:  120 kg    Height:  '5\' 8"'$  (1.727 m)     Weight change:   Physical Examination:  General exam: alert awake, older than stated age HEENT:Oral mucosa moist,  Ear/Nose WNL grossly Respiratory system: bilaterally clear BS, no use of accessory muscle Cardiovascular system: S1 & S2 +, No JVD. Gastrointestinal system: Abdomen soft,NT,ND, BS+ Nervous System:Alert, awake, moving extremities. Extremities: LE edema neg,distal peripheral pulses palpable.  Skin: No rashes,no icterus. MSK: Normal muscle bulk,tone, power  Medications reviewed:  Scheduled Meds:  acidophilus  2 capsule Oral TID   [START ON 01/23/2022] influenza vaccine adjuvanted  0.5 mL Intramuscular Tomorrow-1000   loperamide  4 mg Oral Once   losartan  25 mg Oral Daily   pregabalin  100 mg Oral TID   Continuous Infusions:  pantoprazole 8 mg/hr (01/21/22 2235)      Diet Order             Diet Heart Room service appropriate? Yes; Fluid consistency: Thin  Diet effective now                  Intake/Output Summary (Last 24 hours) at 01/22/2022 1319 Last data filed at 01/22/2022 0900 Gross per 24 hour  Intake 1254.17 ml  Output 4 ml  Net 1250.17 ml   Net IO Since Admission: 1,250.17 mL [01/22/22 1319]  Wt Readings from Last 3 Encounters:  01/22/22 120 kg  01/27/21 95 kg  01/06/21 97.2 kg     Unresulted Labs (From admission, onward)     Start     Ordered   01/23/22 9379  Basic metabolic panel  Daily at 5am,   R      01/22/22 0833   01/23/22 0500  CBC  Daily at 5am,   R      01/22/22 0833   01/23/22 0500  Magnesium  Tomorrow morning,   R        01/22/22 0833   01/22/22 0700  ABO/Rh  Once,   R        01/22/22 0700   01/21/22 2321  CEA  Once,   R        01/21/22 2320   01/21/22 1543  Gastrointestinal Panel by PCR , Stool  (Gastrointestinal Panel by PCR, Stool                                                                                                                                                     **Does Not include CLOSTRIDIUM DIFFICILE testing. **If CDIFF testing is  needed, place order from the "C Difficile Testing" order set.**)  Once,   URGENT         01/21/22 1543          Data Reviewed: I have personally reviewed following labs and imaging studies CBC: Recent Labs  Lab 01/21/22 1543 01/22/22 0548  WBC 9.7 9.0  NEUTROABS 6.5  --   HGB 11.2* 11.3*  HCT 35.7* 36.7  MCV 87.1 89.3  PLT PLATELET CLUMPS NOTED ON SMEAR, UNABLE TO ESTIMATE 086   Basic Metabolic Panel: Recent Labs  Lab 01/21/22 1543 01/22/22 0548  NA 138 137  K 4.0 3.9  CL 107 108  CO2 22 24  GLUCOSE 106* 108*  BUN 15 14  CREATININE 1.05* 0.86  CALCIUM 9.1 8.9   GFR: Estimated Creatinine Clearance: 58 mL/min (by C-G formula based on SCr of 0.86 mg/dL). Liver Function Tests: Recent Labs  Lab 01/21/22 1543  AST 26  ALT 19  ALKPHOS 82  BILITOT 0.9  PROT 7.0  ALBUMIN 3.2*   Recent Labs  Lab 01/21/22 1543  LIPASE 24   No results for input(s): "AMMONIA" in the last 168 hours. Coagulation Profile: Recent Labs  Lab 01/22/22 0548  INR 1.4*   Recent Results (from the past 240 hour(s))  SARS Coronavirus 2 by RT PCR (hospital order, performed in [facility] hospital lab) *cepheid single result test* Anterior Nasal Swab     Status: None   Collection Time: 01/21/22  7:16 PM   Specimen: Anterior Nasal Swab  Result Value Ref Range Status   SARS Coronavirus 2 by RT PCR NEGATIVE NEGATIVE Final    Comment: (NOTE) SARS-CoV-2 target nucleic acids are NOT DETECTED.  The SARS-CoV-2 RNA is generally detectable in upper and lower respiratory specimens during the acute phase of infection. The lowest concentration of SARS-CoV-2 viral copies this assay can detect is 250 copies / mL. A negative result does not preclude SARS-CoV-2 infection and should not be used as the sole basis for treatment or other patient management decisions.  A negative result may occur with improper specimen collection / handling, submission of specimen other than nasopharyngeal swab, presence of viral mutation(s) within the areas targeted by this assay, and inadequate number of viral  copies (<250 copies / mL). A negative result must be combined with clinical observations, patient history, and epidemiological information.  Fact Sheet for Patients:   https://www.patel.info/  Fact Sheet for Healthcare Providers: https://hall.com/  This test is not yet approved or  cleared by the [place] FDA and has been authorized for detection and/or diagnosis of SARS-CoV-2 by FDA under an Emergency Use Authorization (EUA).  This EUA will remain in effect (meaning this test can be used) for the duration of the COVID-19 declaration under Section 564(b)(1) of the Act, 21 U.S.C. section 360bbb-3(b)(1), unless the authorization is terminated or revoked sooner.  Performed at [facility], [place] [address], [place], [place] [address]   C Difficile Quick Screen w PCR reflex     Status: None   Collection Time: 01/21/22  8:18 PM  Result Value Ref Range Status   C Diff antigen NEGATIVE NEGATIVE Final   C Diff toxin NEGATIVE NEGATIVE Final   C Diff interpretation No C. difficile detected.  Final    Comment: Performed at [facility], [place] [address], [place], [place] [address]    Antimicrobials: Anti-infectives (From admission, onward)    None     Radiology Studies: CT ABDOMEN PELVIS W CONTRAST  Result Date: 01/21/2022 CLINICAL DATA:  Diarrhea for the past 2 days. Left-sided hemiplegia. Large amount of non-bloody diarrhea for the past 2 days. EXAM: CT ABDOMEN AND PELVIS WITH CONTRAST TECHNIQUE: Multidetector CT imaging of the abdomen and pelvis was performed using the standard protocol following bolus administration of intravenous contrast. RADIATION DOSE REDUCTION: This exam was performed according to the departmental dose-optimization program which includes automated exposure control, adjustment of the mA and/or kV according to patient size and/or use of iterative reconstruction technique. CONTRAST:   18m OMNIPAQUE IOHEXOL 300 MG/ML  SOLN COMPARISON:  None Available. FINDINGS: Lower chest: Unremarkable. Hepatobiliary: The lateral segment of the left lobe of the liver is enlarged by large mass with central low density and peripheral contrast puddling. This measures 12.1 x 9.2 cm on image number 20/2. There is a smaller similar-appearing mass in the posterior right lobe of the liver, measuring 2.0 cm in maximum diameter on image number 24/2. Multiple gallstones in the gallbladder. The largest measures 1.1 cm in maximum diameter. No gallbladder wall thickening or pericholecystic fluid. Pancreas: Moderate to marked diffuse pancreatic atrophy. Spleen: Normal in size without focal abnormality. Adrenals/Urinary Tract: Normal appearing adrenal glands. Elongated upper pole right renal calculus measuring 1.1 cm on coronal image number 95/6. Similar-appearing calculus in the upper pole of the left kidney measuring 9 mm on image number 154/7. No hydronephrosis on either side. Unremarkable ureters and urinary bladder. Stomach/Bowel: Small hiatal hernia. Large number of colonic diverticula large number of diverticula throughout the majority of the colon without evidence of diverticulitis. Unremarkable small bowel. No evidence of appendicitis. Vascular/Lymphatic: Atheromatous arterial calcifications without aneurysm. No enlarged lymph nodes. Inferior vena cava filter with its tip inferior to the renal veins. Reproductive: Status post hysterectomy. No adnexal masses. Other: Small amount of free peritoneal fluid in the pelvis. Musculoskeletal: Right hip prosthesis. Lumbar and lower thoracic spine degenerative changes. IMPRESSION: 1. No acute abnormality. 2. Large mass in the lateral segment of the left lobe of the liver with a smaller similar-appearing mass in the posterior right lobe of the liver. These are compatible with hemangiomas. 3. Cholelithiasis without evidence of cholecystitis. 4. Extensive colonic diverticulosis. 5.  Small hiatal hernia. 6. Bilateral nonobstructing renal calculi. 7. Small amount of free peritoneal fluid in the pelvis. Electronically Signed   By: SClaudie ReveringM.D.   On: 01/21/2022 20:08   DG Chest Portable 1 View  Result Date: 01/21/2022 CLINICAL DATA:  Diarrhea and weakness for several days. EXAM: PORTABLE CHEST 1 VIEW COMPARISON:  01/25/2021 FINDINGS: The heart size and mediastinal contours are within normal limits. Both lungs are clear. The visualized skeletal structures are unremarkable. IMPRESSION: No active disease. Electronically Signed   By: JMarlaine HindM.D.   On: 01/21/2022 16:26     LOS: 1 day   RAntonieta Pert MD [facility]  01/22/2022, 1:19 PM

## 2022-01-22 NOTE — Hospital Course (Addendum)
89-yof who lives at home with her daughter, has hx of prior CVA with left-sided hemiplegia, bedbound, history of blood clot, status post IVC filter, on Eliquis who developed diarrhea, placed on continuous x3 days, abdominal discomfort so came to the ED.Approximately 1 month ago patient was treated with antibiotics. denies blood in her stool,fever, chills, nausea, vomiting In the ED Hemoccult positive, lasted stable renal function and electrolytes and CBC.   UA with WBC 6-10 RBC more than 50 C Diff ordered and negative, GI panel pending.  Chest x-ray clear. CT abdomen large mass in lateral segment of left lobe of the liver, compatible with hemangiomas, cholelithiasis without cholecystitis, colonic diverticulosis, small hiatal hernia, bilateral renal calculi nonobstructing.  Admitted for further management-GI panel came back negative C. difficile was negative.Hemoglobin has remained stable although FOBT was positive. Patient was managed symptomatically for copious diarrhea C. difficile and GI panel came back negative.  Hemoglobin remains stable although FOBT is positive, put back on Eliquis after cleared by GI and after discussion the patient's risk benefits. At this time diarrhea much improved continue current symptomatic management follow-up outpatient, patient and patient's daughter would like to be discharged home today.

## 2022-01-22 NOTE — Care Management Obs Status (Signed)
[facility] NOTIFICATION   Patient Details  Name: [name] MRN: [id number] Date of Birth: 29-Oct-1928   Medicare Observation Status Notification Given:  Yes    Ross Ludwig, LCSW 01/22/2022, 1:39 PM

## 2022-01-23 DIAGNOSIS — R197 Diarrhea, unspecified: Secondary | ICD-10-CM | POA: Diagnosis not present

## 2022-01-23 LAB — BASIC METABOLIC PANEL
Anion gap: 6 (ref 5–15)
BUN: 14 mg/dL (ref 8–23)
CO2: 25 mmol/L (ref 22–32)
Calcium: 8.8 mg/dL — ABNORMAL LOW (ref 8.9–10.3)
Chloride: 107 mmol/L (ref 98–111)
Creatinine, Ser: 0.95 mg/dL (ref 0.44–1.00)
GFR, Estimated: 57 mL/min — ABNORMAL LOW (ref >60)
Glucose, Bld: 102 mg/dL — ABNORMAL HIGH (ref 70–99)
Potassium: 4 mmol/L (ref 3.5–5.1)
Sodium: 138 mmol/L (ref 135–145)

## 2022-01-23 LAB — CBC
HCT: 36.9 % (ref 36.0–46.0)
Hemoglobin: 11.4 g/dL — ABNORMAL LOW (ref 12.0–15.0)
MCH: 27.1 pg (ref 26.0–34.0)
MCHC: 30.9 g/dL (ref 30.0–36.0)
MCV: 87.6 fL (ref 80.0–100.0)
Platelets: 186 10*3/uL (ref 150–400)
RBC: 4.21 MIL/uL (ref 3.87–5.11)
RDW: 18.4 % — ABNORMAL HIGH (ref 11.5–15.5)
WBC: 7.4 10*3/uL (ref 4.0–10.5)
nRBC: 0 % (ref 0.0–0.2)

## 2022-01-23 LAB — MAGNESIUM: Magnesium: 2 mg/dL (ref 1.7–2.4)

## 2022-01-23 LAB — CEA: CEA: 2.8 ng/mL (ref 0.0–4.7)

## 2022-01-23 MED ORDER — LACTATED RINGERS IV SOLN: INTRAVENOUS | Status: DC

## 2022-01-23 MED ORDER — ZINC OXIDE 40 % EX OINT
TOPICAL_OINTMENT | CUTANEOUS | Status: DC
  Filled 2022-01-23: qty 57

## 2022-01-23 NOTE — Consult Note (Signed)
WOC Nurse Consult Note: Reason for Consult:moisture and maceration plus partial thickness skin loss in inguinal areas secondary to urinary incontinence  ICD-10 CM Codes for Irritant Dermatitis L24A2 - Due to fecal, urinary or dual incontinence L30.4  - Erythema intertrigo. Also used for abrasion of the hand, chafing of the skin, dermatitis due to sweating and friction, friction dermatitis, friction eczema, and genital/thigh intertrigo.  Wound type: ICD due to urinary incontinence Pressure Injury POA: N/A Measurement:scattered in bilateral inguinal, maceration to medial thighs, posterior thighs, buttocks Wound QAE:SLPN, moist Drainage (amount, consistency, odor) scant serous Periwound:macerated Dressing procedure/placement/frequency: Guidance is provided for Nursing for turning and repositioning to minimize time in the supine position. Desitin ointment is provided for moisture barrier provision. A sacral foam is ordered for PI prevention and bilateral pressure redistribution heel boots are provided for PI prevention in those areas.  Our house antimicrobial moisture wicking textile, [employer] Kellie Simmering # [phone]) is to be used in the intertriginous areas as able to wick moisture.   [facility] nursing team will not follow, but will remain available to this patient, the nursing and medical teams.  Please re-consult if needed.  Thank you for inviting [place] to participate in this patient's Plan of Care.  Maudie Flakes, MSN, RN, CNS, [place], Serita Grammes, [employer], [employer] phone:  [phone]

## 2022-01-23 NOTE — Consult Note (Signed)
[facility] Gastroenterology Consult  Referring Provider: [facility] hospitalist/Dr. Lupita Leash Primary Care Physician:  Nolene Ebbs, MD Primary Gastroenterologist: Althia Forts  Reason for Consultation: Diarrhea  HPI: [name] is a 88 y.o. female was admitted on 01/21/2022 with 3 days of diarrhea.  Patient states she had sudden onset of lower abdominal pain associated with loose, liquid bowel movements about 4-5 episodes each day which did not improve for over 2 days and prompted her to come to the ER.  Patient reports taking Imodium at home with minimal improvement.  She has not noted any blood in stool or black stools.  Since being in the hospital she is continued on Imodium and feels that the frequency of bowel movements has decreased and today stools are more formed. Patient states she may have had a colonoscopy more than 10 years ago, is unaware of the indication or the findings.  She denies recent travel, sick contact, recent antibiotic use. She denies acid reflux, heartburn, difficulty swallowing, pain on swallowing, otherwise reports no change in bowel habits previous to this episode, denies unintentional weight loss or loss of appetite.  Denies fever, chills or rigors.   Past Medical History:  Diagnosis Date   Allergic rhinitis    Arthritis    Carpal tunnel syndrome    Deep venous thrombosis (HCC)    bilateral legs   Degenerative arthritis    right knee   Hypertension    Lumbar degenerative disc disease    Lump or mass in breast    Paresthesia    Syncope 05/24/2013   Vitamin D deficiency     Past Surgical History:  Procedure Laterality Date   ABDOMINAL HYSTERECTOMY     CARPAL TUNNEL RELEASE Right    CATARACT EXTRACTION Bilateral    HEMORROIDECTOMY     IVC Filter     TOTAL HIP ARTHROPLASTY Right    ULNAR NERVE TRANSPOSITION Right     Prior to Admission medications   Medication Sig Start Date End Date Taking? Authorizing Provider  albuterol (VENTOLIN HFA) 108 (90 Base)  MCG/ACT inhaler Inhale 1-2 puffs into the lungs every 6 (six) hours as needed for wheezing or shortness of breath.   Yes [provider]  apixaban (ELIQUIS) 5 MG TABS tablet Take 5 mg by mouth 2 (two) times daily.   Yes [provider]  atorvastatin (LIPITOR) 20 MG tablet Take 1 tablet (20 mg total) by mouth daily. Patient taking differently: Take 20 mg by mouth at bedtime. 01/07/21  Yes Dennison Mascot, PA-C  diclofenac sodium (VOLTAREN) 1 % GEL Apply 1 application topically 4 (four) times daily as needed (knee and hand pain).   Yes [provider]  escitalopram (LEXAPRO) 10 MG tablet Take 10 mg by mouth at bedtime. 05/25/21  Yes [provider]  losartan (COZAAR) 25 MG tablet Take 25 mg by mouth daily. 11/04/21  Yes [provider]  meclizine (ANTIVERT) 25 MG tablet Take 25 mg by mouth 3 (three) times daily as needed for dizziness. As needed for vertigo   Yes [provider]  pregabalin (LYRICA) 100 MG capsule Take 100 mg by mouth 3 (three) times daily. 05/31/21  Yes [provider]    Current Facility-Administered Medications  Medication Dose Route Frequency Provider Last Rate Last Admin   acetaminophen (TYLENOL) tablet 650 mg  650 mg Oral Q6H PRN Crosley, Debby, MD       Or   acetaminophen (TYLENOL) suppository 650 mg  650 mg Rectal Q6H PRN Crosley, Debby,  MD       acidophilus (RISAQUAD) capsule 2 capsule  2 capsule Oral TID Quintella Baton, MD   2 capsule at 01/23/22 1014   albuterol (PROVENTIL) (2.5 MG/3ML) 0.083% nebulizer solution 3 mL  3 mL Inhalation Q6H PRN Claria Dice, Debby, MD       atorvastatin (LIPITOR) tablet 20 mg  20 mg Oral QHS Kc, Maren Beach, MD   20 mg at 01/22/22 2109   diclofenac Sodium (VOLTAREN) 1 % topical gel 4 g  4 g Topical QID PRN Antonieta Pert, MD   4 g at 01/22/22 1929   escitalopram (LEXAPRO) tablet 10 mg  10 mg Oral QHS Kc, Maren Beach, MD   10 mg at 01/22/22 2109   hydrALAZINE (APRESOLINE) injection 5 mg  5 mg  Intravenous Q4H PRN Crosley, Debby, MD       influenza vaccine adjuvanted (FLUAD) injection 0.5 mL  0.5 mL Intramuscular Tomorrow-1000 Crosley, Debby, MD       liver oil-zinc oxide (DESITIN) 40 % ointment   Topical Q4H Kc, Maren Beach, MD   Given at 01/23/22 1250   loperamide (IMODIUM) capsule 4 mg  4 mg Oral Once Crosley, Debby, MD       Followed by   loperamide (IMODIUM) capsule 2 mg  2 mg Oral PRN Claria Dice, Debby, MD       losartan (COZAAR) tablet 25 mg  25 mg Oral Daily Crosley, Debby, MD   25 mg at 01/23/22 1014   meclizine (ANTIVERT) tablet 25 mg  25 mg Oral TID PRN Quintella Baton, MD       ondansetron (ZOFRAN) tablet 4 mg  4 mg Oral Q6H PRN Crosley, Debby, MD       Or   ondansetron (ZOFRAN) injection 4 mg  4 mg Intravenous Q6H PRN Crosley, Debby, MD       Oral care mouth rinse  15 mL Mouth Rinse PRN Kc, Ramesh, MD       pantoprozole (PROTONIX) 80 mg /NS 100 mL infusion  8 mg/hr Intravenous Continuous Isla Pence, MD 10 mL/hr at 01/22/22 2110 8 mg/hr at 01/22/22 2110   pregabalin (LYRICA) capsule 100 mg  100 mg Oral TID Quintella Baton, MD   100 mg at 01/23/22 1014    Allergies as of 01/21/2022 - Review Complete 01/21/2022  Allergen Reaction Noted   Codeine Other (See Comments) 05/23/2013   Penicillins Other (See Comments) 05/23/2013    Family History  Problem Relation Age of Onset   Diabetes Mother    Kidney failure Father    Cancer - Lung Brother    Cancer - Prostate Brother    Kidney failure Son     Social History   Socioeconomic History   Marital status: Widowed    Spouse name: Not on file   Number of children: 3   Years of education: 46   Highest education level: Not on file  Occupational History   Occupation: retired  Tobacco Use   Smoking status: Every Day    Packs/day: 10.00    Years: 30.00    Total pack years: 300.00    Types: Cigarettes    Passive exposure: Never   Smokeless tobacco: Never  Substance and Sexual Activity   Alcohol use: Yes    Comment:  occasional   Drug use: No   Sexual activity: Not on file  Other Topics Concern   Not on file  Social History Narrative   Not on file   Social Determinants of Health   Financial Resource  Strain: Not on file  Food Insecurity: No Food Insecurity (01/22/2022)   Hunger Vital Sign    Worried About Running Out of Food in the Last Year: Never true    Ran Out of Food in the Last Year: Never true  Transportation Needs: No Transportation Needs (01/22/2022)   PRAPARE - Hydrologist (Medical): No    Lack of Transportation (Non-Medical): No  Physical Activity: Not on file  Stress: Not on file  Social Connections: Not on file  Intimate Partner Violence: Not At Risk (01/22/2022)   Humiliation, Afraid, Rape, and Kick questionnaire    Fear of Current or Ex-Partner: No    Emotionally Abused: No    Physically Abused: No    Sexually Abused: No    Review of Systems:  GI: Described in detail in HPI.    Gen: Denies any fever, chills, rigors, night sweats, anorexia, fatigue, weakness, malaise, involuntary weight loss, and sleep disorder CV: Denies chest pain, angina, palpitations, syncope, orthopnea, PND, peripheral edema, and claudication. Resp: Denies dyspnea, cough, sputum, wheezing, coughing up blood. GU : Denies urinary burning, blood in urine, urinary frequency, urinary hesitancy, nocturnal urination, and urinary incontinence. MS: Denies joint pain or swelling.  Denies muscle weakness, cramps, atrophy.  Derm: Denies rash, itching, oral ulcerations, hives, unhealing ulcers.  Psych: Denies depression, anxiety, memory loss, suicidal ideation, hallucinations,  and confusion. Heme: Denies bruising, bleeding, and enlarged lymph nodes. Neuro: Left-sided hemiplegia, bedbound Endo:  Denies any problems with DM, thyroid, adrenal function.  Physical Exam: Vital signs in last 24 hours: Temp:  [97.7 F (36.5 C)-98.4 F (36.9 C)] 98.4 F (36.9 C) (10/08 0439) Pulse Rate:   [63-72] 72 (10/08 0439) Resp:  [18-20] 20 (10/08 0439) BP: (123-137)/(57-71) 135/71 (10/08 0439) SpO2:  [95 %-100 %] 95 % (10/08 0439) Last BM Date : 01/23/22  General:   Alert,  Well-developed, over weight, pleasant and cooperative in NAD Head:  Normocephalic and atraumatic. Eyes:  Sclera clear, no icterus.   Conjunctiva pink. Ears:  Normal auditory acuity. Nose:  No deformity, discharge,  or lesions. Mouth:  No deformity or lesions.  Oropharynx pink & moist. Neck:  Supple; no masses or thyromegaly. Lungs:  Clear throughout to auscultation.   No wheezes, crackles, or rhonchi. No acute distress. Heart:  Regular rate and rhythm; no murmurs, clicks, rubs,  or gallops. Extremities:  Without clubbing or edema. Neurologic:  Alert and  oriented x4;  grossly normal neurologically. Skin:  Intact without significant lesions or rashes. Psych:  Alert and cooperative. Normal mood and affect. Abdomen:  Soft, nontender and nondistended. No masses, hepatosplenomegaly or hernias noted. Normal bowel sounds, without guarding, and without rebound.         Lab Results: Recent Labs    01/21/22 1543 01/22/22 0548 01/23/22 0647  WBC 9.7 9.0 7.4  HGB 11.2* 11.3* 11.4*  HCT 35.7* 36.7 36.9  PLT PLATELET CLUMPS NOTED ON SMEAR, UNABLE TO ESTIMATE 172 186   BMET Recent Labs    01/21/22 1543 01/22/22 0548 01/23/22 0647  NA 138 137 138  K 4.0 3.9 4.0  CL 107 108 107  CO2 '22 24 25  '$ GLUCOSE 106* 108* 102*  BUN '15 14 14  '$ CREATININE 1.05* 0.86 0.95  CALCIUM 9.1 8.9 8.8*   LFT Recent Labs    01/21/22 1543  PROT 7.0  ALBUMIN 3.2*  AST 26  ALT 19  ALKPHOS 82  BILITOT 0.9   PT/INR Recent Labs  01/22/22 0548  LABPROT 16.8*  INR 1.4*    Studies/Results: CT ABDOMEN PELVIS W CONTRAST  Result Date: 01/21/2022 CLINICAL DATA:  Diarrhea for the past 2 days. Left-sided hemiplegia. Large amount of non-bloody diarrhea for the past 2 days. EXAM: CT ABDOMEN AND PELVIS WITH CONTRAST TECHNIQUE:  Multidetector CT imaging of the abdomen and pelvis was performed using the standard protocol following bolus administration of intravenous contrast. RADIATION DOSE REDUCTION: This exam was performed according to the departmental dose-optimization program which includes automated exposure control, adjustment of the mA and/or kV according to patient size and/or use of iterative reconstruction technique. CONTRAST:  68m OMNIPAQUE IOHEXOL 300 MG/ML  SOLN COMPARISON:  None Available. FINDINGS: Lower chest: Unremarkable. Hepatobiliary: The lateral segment of the left lobe of the liver is enlarged by large mass with central low density and peripheral contrast puddling. This measures 12.1 x 9.2 cm on image number 20/2. There is a smaller similar-appearing mass in the posterior right lobe of the liver, measuring 2.0 cm in maximum diameter on image number 24/2. Multiple gallstones in the gallbladder. The largest measures 1.1 cm in maximum diameter. No gallbladder wall thickening or pericholecystic fluid. Pancreas: Moderate to marked diffuse pancreatic atrophy. Spleen: Normal in size without focal abnormality. Adrenals/Urinary Tract: Normal appearing adrenal glands. Elongated upper pole right renal calculus measuring 1.1 cm on coronal image number 95/6. Similar-appearing calculus in the upper pole of the left kidney measuring 9 mm on image number 154/7. No hydronephrosis on either side. Unremarkable ureters and urinary bladder. Stomach/Bowel: Small hiatal hernia. Large number of colonic diverticula large number of diverticula throughout the majority of the colon without evidence of diverticulitis. Unremarkable small bowel. No evidence of appendicitis. Vascular/Lymphatic: Atheromatous arterial calcifications without aneurysm. No enlarged lymph nodes. Inferior vena cava filter with its tip inferior to the renal veins. Reproductive: Status post hysterectomy. No adnexal masses. Other: Small amount of free peritoneal fluid in the  pelvis. Musculoskeletal: Right hip prosthesis. Lumbar and lower thoracic spine degenerative changes. IMPRESSION: 1. No acute abnormality. 2. Large mass in the lateral segment of the left lobe of the liver with a smaller similar-appearing mass in the posterior right lobe of the liver. These are compatible with hemangiomas. 3. Cholelithiasis without evidence of cholecystitis. 4. Extensive colonic diverticulosis. 5. Small hiatal hernia. 6. Bilateral nonobstructing renal calculi. 7. Small amount of free peritoneal fluid in the pelvis. Electronically Signed   By: SClaudie ReveringM.D.   On: 01/21/2022 20:08   DG Chest Portable 1 View  Result Date: 01/21/2022 CLINICAL DATA:  Diarrhea and weakness for several days. EXAM: PORTABLE CHEST 1 VIEW COMPARISON:  01/25/2021 FINDINGS: The heart size and mediastinal contours are within normal limits. Both lungs are clear. The visualized skeletal structures are unremarkable. IMPRESSION: No active disease. Electronically Signed   By: JMarlaine HindM.D.   On: 01/21/2022 16:26    Impression: Acute diarrhea-C. difficile negative, GI pathogen panel negative FOBT positive in the setting of erythema intertrigo without melena or hematochezia CT did not show any abnormality of stomach or small bowel except small hiatal hernia and large number of colonic diverticulosis  12.1 cm large liver hemangioma Cholelithiasis  Comorbidities-mild delirium, hypertension, CVA with left-sided hemiaplasia, bedbound on Eliquis, IVC filter placement, peripheral neuropathy, obesity  Plan: Unclear etiology of acute diarrhea, plan to manage conservatively.  Tolerating regular diet.  Has been started on acidophilus 2 capsules 3 times a day and loperamide 2 to 4 mg as needed.  If this does not help with  diarrhea we can consider use of cholestyramine or colestipol.  No indication for Protonix drip, will discontinue this, although stool is FOBT positive, patient does not have any evidence of melena  or hematochezia and hemoglobin is minimally low at 11.2/11.3/11.4.  Dr. Randel Pigg will follow patient tomorrow.   LOS: 1 day   Ronnette Juniper, MD  01/23/2022, 1:12 PM

## 2022-01-23 NOTE — Plan of Care (Signed)

## 2022-01-23 NOTE — Progress Notes (Signed)
PROGRESS NOTE [name]  IRS:[id number] DOB: 12/19/1928 DOA: 01/21/2022 PCP: Nolene Ebbs, MD   Brief Narrative/Hospital Course: 37-yof who lives at home with her daughter, has hx of prior CVA with left-sided hemiplegia, bedbound, history of blood clot, status post IVC filter, on Eliquis who developed diarrhea, placed on continuous x3 days, abdominal discomfort so came to the ED.Approximately 1 month ago patient was treated with antibiotics. denies blood in her stool,fever, chills, nausea, vomiting In the ED Hemoccult positive, lasted stable renal function and electrolytes and CBC.   UA with WBC 6-10 RBC more than 50 C Diff ordered and negative, GI panel pending.  Chest x-ray clear. CT abdomen large mass in lateral segment of left lobe of the liver, compatible with hemangiomas, cholelithiasis without cholecystitis, colonic diverticulosis, small hiatal hernia, bilateral renal calculi nonobstructing.  Admitted for further management-GI panel came back negative C. difficile was negative.Hemoglobin has remained stable although FOBT was positive.   Subjective: Seen and examined.  Patient complains of ongoing diarrhea no nausea or vomiting.    Assessment and Plan: Principal Problem:   Diarrhea Active Problems:   CVA (cerebral vascular accident) ([place])   Essential hypertension   Acute kidney injury ([place])   Anemia   Occult blood positive stool   Hematuria   Hemiplegia as late effect of cerebral infarction ([place])   Copious Diarrhea with abdominal pain Hemoccult positive stool/diarrhea: Unclear etiology of diarrhea, C. difficile and GI panel negative.  Appreciate GI input, PPI discontinued-although FOBT positive does not have evidence of melena or hematochezia and hemoglobin minimally low.  On acidophilus, loperamide, if persistent consider trial of cholestyramine or colestipol. CT abdomen as above no acute finding.  Keep on gentle IV rehydration while having diarrhea Recent Labs  Lab  01/21/22 1543 01/22/22 0548 01/23/22 0647  HGB 11.2* 11.3* 11.4*  HCT 35.7* 36.7 36.9     Mild hematuria monitor  Mild delirium: Patient with stroke, she is at times forgetful at home and does get confused as per the daughter, minimize psychotropic medication continue supportive care and hydration.  Currently stable improved  Hypertension-BP stable.  cont losartan. History of previous CVA with left side hemiplegia/bedbound-on eliquis History of CVTA S/P  IVC filter: IF H&H remains stable and no further GI intervention and if okay with GI we will resume anticoagulation soon  Peripheral neuropathy on pregabalin Class III Obesity:Patient's Body mass index is 40.22 kg/m. : Will benefit with PCP follow-up, weight loss  healthy lifestyle and outpatient sleep evaluation.   DVT prophylaxis: SCDs Start: 01/21/22 2314 Code Status:   Code Status: Full Code Family Communication: plan of care discussed with patient/DAUGHTER UPDATED ON PHONE  Patient status is: Remains inpatient because of diarrhea and further management, hemoglobin monitoring with FOBT positive, Level of care: Med-Surg   Dispo: The patient is from: HOME             Anticipated disposition:  tbd  Mobility Assessment (last 72 hours)     Mobility Assessment     Row Name 01/23/22 1100 01/22/22 1942 01/22/22 0300       Does patient have an order for bedrest or is patient medically unstable Yes- Bedfast (Level 1) - Complete Yes- Bedfast (Level 1) - Complete No - Continue assessment     What is the highest level of mobility based on the progressive mobility assessment? Level 1 (Bedfast) - Unable to balance while sitting on edge of bed Level 1 (Bedfast) - Unable to balance while sitting on edge of  bed Level 1 (Bedfast) - Unable to balance while sitting on edge of bed     Is the above level different from baseline mobility prior to current illness? No - Consider discontinuing PT/OT Yes - Recommend PT order No - Consider  discontinuing PT/OT               Objective: Vitals last 24 hrs: Vitals:   01/22/22 1613 01/22/22 1938 01/23/22 0439 01/23/22 1411  BP: (!) 137/57 123/68 135/71 (!) 134/50  Pulse: 63 67 72 68  Resp: '18 20 20 18  '$ Temp: 97.7 F (36.5 C) 97.9 F (36.6 C) 98.4 F (36.9 C) 97.6 F (36.4 C)  TempSrc: Oral Oral Oral Oral  SpO2: 100% 100% 95% 99%  Weight:      Height:       Weight change:   Physical Examination: General exam: alert awake, oriented to self place current president, pleasant, older than stated age HEENT:Oral mucosa moist, Ear/Nose WNL grossly Respiratory system: Bilaterally clear BS, no use of accessory muscle Cardiovascular system: S1 & S2 +, No JVD. Gastrointestinal system: Abdomen soft,NT,ND, BS+ Nervous System: Alert, awake, moving extremities with left-sided weakness  Extremities: LE edema chronic appearing,distal peripheral pulses palpable.  Skin: No rashes,no icterus. MSK: obese muscle bulk,tone, power   Medications reviewed:  Scheduled Meds:  acidophilus  2 capsule Oral TID   atorvastatin  20 mg Oral QHS   escitalopram  10 mg Oral QHS   influenza vaccine adjuvanted  0.5 mL Intramuscular Tomorrow-1000   liver oil-zinc oxide   Topical Q4H   loperamide  4 mg Oral Once   losartan  25 mg Oral Daily   pregabalin  100 mg Oral TID   Continuous Infusions:      Diet Order             Diet Heart Room service appropriate? Yes; Fluid consistency: Thin  Diet effective now                  Intake/Output Summary (Last 24 hours) at 01/23/2022 1449 Last data filed at 01/23/2022 1300 Gross per 24 hour  Intake 905.84 ml  Output 675 ml  Net 230.84 ml    Net IO Since Admission: 1,681.01 mL [01/23/22 1449]  Wt Readings from Last 3 Encounters:  01/22/22 120 kg  01/27/21 95 kg  01/06/21 97.2 kg     Unresulted Labs (From admission, onward)     Start     Ordered   01/23/22 6213  Basic metabolic panel  Daily at 5am,   R      01/22/22 0833    01/23/22 0500  CBC  Daily at 5am,   R      01/22/22 0833   01/21/22 2321  CEA  Once,   R        01/21/22 2320          Data Reviewed: I have personally reviewed following labs and imaging studies CBC: Recent Labs  Lab 01/21/22 1543 01/22/22 0548 01/23/22 0647  WBC 9.7 9.0 7.4  NEUTROABS 6.5  --   --   HGB 11.2* 11.3* 11.4*  HCT 35.7* 36.7 36.9  MCV 87.1 89.3 87.6  PLT PLATELET CLUMPS NOTED ON SMEAR, UNABLE TO ESTIMATE 172 086    Basic Metabolic Panel: Recent Labs  Lab 01/21/22 1543 01/22/22 0548 01/23/22 0647  NA 138 137 138  K 4.0 3.9 4.0  CL 107 108 107  CO2 '22 24 25  '$ GLUCOSE 106* 108* 102*  BUN '15 14 14  '$ CREATININE 1.05* 0.86 0.95  CALCIUM 9.1 8.9 8.8*  MG  --   --  2.0    GFR: Estimated Creatinine Clearance: 52.5 mL/min (by C-G formula based on SCr of 0.95 mg/dL). Liver Function Tests: Recent Labs  Lab 01/21/22 1543  AST 26  ALT 19  ALKPHOS 82  BILITOT 0.9  PROT 7.0  ALBUMIN 3.2*    Recent Labs  Lab 01/21/22 1543  LIPASE 24    No results for input(s): "AMMONIA" in the last 168 hours. Coagulation Profile: Recent Labs  Lab 01/22/22 0548  INR 1.4*    Recent Results (from the past 240 hour(s))  SARS Coronavirus 2 by RT PCR (hospital order, performed in [facility] hospital lab) *cepheid single result test* Anterior Nasal Swab     Status: None   Collection Time: 01/21/22  7:16 PM   Specimen: Anterior Nasal Swab  Result Value Ref Range Status   SARS Coronavirus 2 by RT PCR NEGATIVE NEGATIVE Final    Comment: (NOTE) SARS-CoV-2 target nucleic acids are NOT DETECTED.  The SARS-CoV-2 RNA is generally detectable in upper and lower respiratory specimens during the acute phase of infection. The lowest concentration of SARS-CoV-2 viral copies this assay can detect is 250 copies / mL. A negative result does not preclude SARS-CoV-2 infection and should not be used as the sole basis for treatment or other patient management decisions.  A negative  result may occur with improper specimen collection / handling, submission of specimen other than nasopharyngeal swab, presence of viral mutation(s) within the areas targeted by this assay, and inadequate number of viral copies (<250 copies / mL). A negative result must be combined with clinical observations, patient history, and epidemiological information.  Fact Sheet for Patients:   https://www.patel.info/  Fact Sheet for Healthcare Providers: https://hall.com/  This test is not yet approved or  cleared by the [place] FDA and has been authorized for detection and/or diagnosis of SARS-CoV-2 by FDA under an Emergency Use Authorization (EUA).  This EUA will remain in effect (meaning this test can be used) for the duration of the COVID-19 declaration under Section 564(b)(1) of the Act, 21 U.S.C. section 360bbb-3(b)(1), unless the authorization is terminated or revoked sooner.  Performed at [facility], [place] [address] [place], [place] [address]   Gastrointestinal Panel by PCR , Stool     Status: None   Collection Time: 01/21/22  8:18 PM   Specimen: Stool  Result Value Ref Range Status   Campylobacter species NOT DETECTED NOT DETECTED Final   Plesimonas shigelloides NOT DETECTED NOT DETECTED Final   Salmonella species NOT DETECTED NOT DETECTED Final   Yersinia enterocolitica NOT DETECTED NOT DETECTED Final   Vibrio species NOT DETECTED NOT DETECTED Final   Vibrio cholerae NOT DETECTED NOT DETECTED Final   Enteroaggregative E coli (EAEC) NOT DETECTED NOT DETECTED Final   Enteropathogenic E coli (EPEC) NOT DETECTED NOT DETECTED Final   Enterotoxigenic E coli (ETEC) NOT DETECTED NOT DETECTED Final   Shiga like toxin producing E coli (STEC) NOT DETECTED NOT DETECTED Final   Shigella/Enteroinvasive E coli (EIEC) NOT DETECTED NOT DETECTED Final   Cryptosporidium NOT DETECTED NOT DETECTED Final   Cyclospora cayetanensis  NOT DETECTED NOT DETECTED Final   Entamoeba histolytica NOT DETECTED NOT DETECTED Final   Giardia lamblia NOT DETECTED NOT DETECTED Final   Adenovirus F40/41 NOT DETECTED NOT DETECTED Final   Astrovirus NOT DETECTED NOT DETECTED Final   Norovirus GI/GII  NOT DETECTED NOT DETECTED Final   Rotavirus A NOT DETECTED NOT DETECTED Final   Sapovirus (I, II, IV, and V) NOT DETECTED NOT DETECTED Final    Comment: Performed at [facility], [place], [place] [address]  C Difficile Quick Screen w PCR reflex     Status: None   Collection Time: 01/21/22  8:18 PM  Result Value Ref Range Status   C Diff antigen NEGATIVE NEGATIVE Final   C Diff toxin NEGATIVE NEGATIVE Final   C Diff interpretation No C. difficile detected.  Final    Comment: Performed at [facility], [place] [address], [place], [place] [address]    Antimicrobials: Anti-infectives (From admission, onward)    None     Radiology Studies: CT ABDOMEN PELVIS W CONTRAST  Result Date: 01/21/2022 CLINICAL DATA:  Diarrhea for the past 2 days. Left-sided hemiplegia. Large amount of non-bloody diarrhea for the past 2 days. EXAM: CT ABDOMEN AND PELVIS WITH CONTRAST TECHNIQUE: Multidetector CT imaging of the abdomen and pelvis was performed using the standard protocol following bolus administration of intravenous contrast. RADIATION DOSE REDUCTION: This exam was performed according to the departmental dose-optimization program which includes automated exposure control, adjustment of the mA and/or kV according to patient size and/or use of iterative reconstruction technique. CONTRAST:  21m OMNIPAQUE IOHEXOL 300 MG/ML  SOLN COMPARISON:  None Available. FINDINGS: Lower chest: Unremarkable. Hepatobiliary: The lateral segment of the left lobe of the liver is enlarged by large mass with central low density and peripheral contrast puddling. This measures 12.1 x 9.2 cm on image number 20/2. There is a smaller  similar-appearing mass in the posterior right lobe of the liver, measuring 2.0 cm in maximum diameter on image number 24/2. Multiple gallstones in the gallbladder. The largest measures 1.1 cm in maximum diameter. No gallbladder wall thickening or pericholecystic fluid. Pancreas: Moderate to marked diffuse pancreatic atrophy. Spleen: Normal in size without focal abnormality. Adrenals/Urinary Tract: Normal appearing adrenal glands. Elongated upper pole right renal calculus measuring 1.1 cm on coronal image number 95/6. Similar-appearing calculus in the upper pole of the left kidney measuring 9 mm on image number 154/7. No hydronephrosis on either side. Unremarkable ureters and urinary bladder. Stomach/Bowel: Small hiatal hernia. Large number of colonic diverticula large number of diverticula throughout the majority of the colon without evidence of diverticulitis. Unremarkable small bowel. No evidence of appendicitis. Vascular/Lymphatic: Atheromatous arterial calcifications without aneurysm. No enlarged lymph nodes. Inferior vena cava filter with its tip inferior to the renal veins. Reproductive: Status post hysterectomy. No adnexal masses. Other: Small amount of free peritoneal fluid in the pelvis. Musculoskeletal: Right hip prosthesis. Lumbar and lower thoracic spine degenerative changes. IMPRESSION: 1. No acute abnormality. 2. Large mass in the lateral segment of the left lobe of the liver with a smaller similar-appearing mass in the posterior right lobe of the liver. These are compatible with hemangiomas. 3. Cholelithiasis without evidence of cholecystitis. 4. Extensive colonic diverticulosis. 5. Small hiatal hernia. 6. Bilateral nonobstructing renal calculi. 7. Small amount of free peritoneal fluid in the pelvis. Electronically Signed   By: SClaudie ReveringM.D.   On: 01/21/2022 20:08   DG Chest Portable 1 View  Result Date: 01/21/2022 CLINICAL DATA:  Diarrhea and weakness for several days. EXAM: PORTABLE CHEST 1  VIEW COMPARISON:  01/25/2021 FINDINGS: The heart size and mediastinal contours are within normal limits. Both lungs are clear. The visualized skeletal structures are unremarkable. IMPRESSION: No active disease. Electronically Signed  By: Marlaine Hind M.D.   On: 01/21/2022 16:26     LOS: 1 day   Antonieta Pert, MD [facility]  01/23/2022, 2:49 PM

## 2022-01-24 DIAGNOSIS — R197 Diarrhea, unspecified: Secondary | ICD-10-CM | POA: Diagnosis not present

## 2022-01-24 LAB — CBC
HCT: 38.5 % (ref 36.0–46.0)
Hemoglobin: 11.8 g/dL — ABNORMAL LOW (ref 12.0–15.0)
MCH: 27.1 pg (ref 26.0–34.0)
MCHC: 30.6 g/dL (ref 30.0–36.0)
MCV: 88.3 fL (ref 80.0–100.0)
Platelets: 178 10*3/uL (ref 150–400)
RBC: 4.36 MIL/uL (ref 3.87–5.11)
RDW: 18.2 % — ABNORMAL HIGH (ref 11.5–15.5)
WBC: 6.5 10*3/uL (ref 4.0–10.5)
nRBC: 0 % (ref 0.0–0.2)

## 2022-01-24 LAB — BASIC METABOLIC PANEL
Anion gap: 8 (ref 5–15)
BUN: 14 mg/dL (ref 8–23)
CO2: 22 mmol/L (ref 22–32)
Calcium: 9 mg/dL (ref 8.9–10.3)
Chloride: 108 mmol/L (ref 98–111)
Creatinine, Ser: 0.98 mg/dL (ref 0.44–1.00)
GFR, Estimated: 54 mL/min — ABNORMAL LOW (ref >60)
Glucose, Bld: 105 mg/dL — ABNORMAL HIGH (ref 70–99)
Potassium: 3.9 mmol/L (ref 3.5–5.1)
Sodium: 138 mmol/L (ref 135–145)

## 2022-01-24 MED ORDER — LOPERAMIDE HCL 2 MG PO CAPS: 2.0000 mg | ORAL_CAPSULE | ORAL | 0 refills | Status: AC | PRN

## 2022-01-24 MED ORDER — APIXABAN 5 MG PO TABS
5.0000 mg | ORAL_TABLET | Freq: Two times a day (BID) | ORAL | Status: DC
  Administered 2022-01-24: 5 mg via ORAL
  Filled 2022-01-24: qty 1

## 2022-01-24 MED ORDER — RISAQUAD PO CAPS: 2.0000 | ORAL_CAPSULE | Freq: Three times a day (TID) | ORAL | 0 refills | Status: AC

## 2022-01-24 NOTE — Progress Notes (Signed)
[facility] Gastroenterology Progress Note  [name] 88 y.o. 09/07/1928  CC:  Diarrhea   Subjective: States she is feeling much better today.  Denies any further diarrhea since yesterday, no bowel movement overnight.  Continues to tolerate diet without nausea or vomiting.  Denies abdominal pain, fever, chills.  ROS : Review of Systems  Constitutional:  Negative for chills and fever.  Gastrointestinal:  Negative for abdominal pain, constipation, diarrhea, heartburn, nausea and vomiting.      Objective: Vital signs in last 24 hours: Vitals:   01/23/22 1411 01/23/22 2015  BP: (!) 134/50 123/69  Pulse: 68 68  Resp: 18 20  Temp: 97.6 F (36.4 C) 97.8 F (36.6 C)  SpO2: 99% 97%    Physical Exam:  General:  Elderly, alert, cooperative, no distress  Head:  Normocephalic, without obvious abnormality, atraumatic  Eyes:  Anicteric sclera, EOM's intact  Lungs:   Clear to auscultation bilaterally, respirations unlabored  Heart:  Regular rate and rhythm, S1, S2 normal  Abdomen:   Soft, non-tender, mildly distended, bowel sounds active all four quadrants  Extremities: Extremities normal, atraumatic, no  edema  Pulses: 2+ and symmetric    Lab Results: Recent Labs    01/23/22 0647 01/24/22 0506  NA 138 138  K 4.0 3.9  CL 107 108  CO2 25 22  GLUCOSE 102* 105*  BUN 14 14  CREATININE 0.95 0.98  CALCIUM 8.8* 9.0  MG 2.0  --    Recent Labs    01/21/22 1543  AST 26  ALT 19  ALKPHOS 82  BILITOT 0.9  PROT 7.0  ALBUMIN 3.2*   Recent Labs    01/21/22 1543 01/22/22 0548 01/23/22 0647 01/24/22 0506  WBC 9.7   < > 7.4 6.5  NEUTROABS 6.5  --   --   --   HGB 11.2*   < > 11.4* 11.8*  HCT 35.7*   < > 36.9 38.5  MCV 87.1   < > 87.6 88.3  PLT PLATELET CLUMPS NOTED ON SMEAR, UNABLE TO ESTIMATE   < > 186 178   < > = values in this interval not displayed.   Recent Labs    01/22/22 0548  LABPROT 16.8*  INR 1.4*      Assessment Acute diarrhea - C. difficile and GI  pathogen panel negative - FOBT positive in setting of erythema intertrigo without melena or hematochezia - CT did not show any abnormality of stomach or small bowel except small hiatal hernia and extensive colonic diverticulosis. Large liver hemangioma. Cholelithiasis. - Hemoglobin 11.8, stable - No leukocytosis  Comorbidities - Mild delirium, HTN, history of CVA with left-sided hemiaplasia, bedbound on Eliquis, IVC filter placement, peripheral neuropathy, obesity   Plan: Conservative management with acidophilus 2 capsules 3 times a day and loperamide 2 to 4 mg as needed. Continue regular diet as tolerated. [facility] will follow.  Angelique Holm PA-C 01/24/2022, 11:19 AM  Contact #  [phone]

## 2022-01-24 NOTE — Progress Notes (Signed)
PTAR arrived to unit packet given with AVS and pt discharged.

## 2022-01-24 NOTE — TOC Transition Note (Signed)
Transition of Care [facility]) - CM/SW Discharge Note   Patient Details  Name: [name] MRN: [id number] Date of Birth: [birth year]  Transition of [facility] CM/SW Contact:  Vassie Moselle, LCSW Phone Number: 01/24/2022, 1:34 PM   Clinical Narrative:    Pt to discharge home w/ 24/7 caregiver in place. Pt is nonambulatory and will need PTAR for transportation home. Pt's daughter aware and agreeable to discharge plans.    Final next level of care: Home/Self Care Barriers to Discharge: No Barriers Identified   Patient Goals and CMS Choice Patient states their goals for this hospitalization and ongoing recovery are:: Per daughter for pt to return home CMS Medicare.gov Compare Post Acute Care list provided to:: Legal Guardian Choice offered to / list presented to : [facility] POA / Guardian, Adult Children  Discharge Placement                Patient to be transferred to facility by: [place] Name of family member notified: Clinton Quant Patient and family notified of of transfer: 01/24/22  Discharge Plan and Services                DME Arranged: N/A DME Agency: NA                  Social Determinants of Health ([place]) Interventions     Readmission Risk Interventions    01/24/2022    8:45 AM  Readmission Risk Prevention Plan  Post Dischage Appt Complete  Medication Screening Complete  Transportation Screening Complete

## 2022-01-24 NOTE — Discharge Summary (Signed)
Physician Discharge Summary  [name] MBT:[id number] DOB: Jul 11, 1928 DOA: 01/21/2022  PCP: Nolene Ebbs, MD  Admit date: 01/21/2022 Discharge date: 01/24/2022 Recommendations for Outpatient Follow-up:  Follow up with PCP in 1 weeks-call for appointment Please obtain BMP/CBC in one week  Discharge Dispo: home Discharge Condition: Stable Code Status:   Code Status: Full Code Diet recommendation:  Diet Order             Diet Heart Room service appropriate? Yes; Fluid consistency: Thin  Diet effective now                    Brief/Interim Summary: 89-yof who lives at home with her daughter, has hx of prior CVA with left-sided hemiplegia, bedbound, history of blood clot, status post IVC filter, on Eliquis who developed diarrhea, placed on continuous x3 days, abdominal discomfort so came to the ED.Approximately 1 month ago patient was treated with antibiotics. denies blood in her stool,fever, chills, nausea, vomiting In the ED Hemoccult positive, lasted stable renal function and electrolytes and CBC.   UA with WBC 6-10 RBC more than 50 C Diff ordered and negative, GI panel pending.  Chest x-ray clear. CT abdomen large mass in lateral segment of left lobe of the liver, compatible with hemangiomas, cholelithiasis without cholecystitis, colonic diverticulosis, small hiatal hernia, bilateral renal calculi nonobstructing.  Admitted for further management-GI panel came back negative C. difficile was negative.Hemoglobin has remained stable although FOBT was positive. Patient was managed symptomatically for copious diarrhea C. difficile and GI panel came back negative.  Hemoglobin remains stable although FOBT is positive, put back on Eliquis after cleared by GI and after discussion the patient's risk benefits. At this time diarrhea much improved continue current symptomatic management follow-up outpatient, patient and patient's daughter would like to be discharged home today.   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 difficile and GI panel negative.  Appreciate GI input, PPI discontinued-although FOBT positive does not have evidence of melena or hematochezia and hemoglobin minimally low.  On acidophilus, loperamide, if persistent consider trial of cholestyramine or colestipol. CT abdomen as above no acute finding diarrhea improved, being discharged home today after GI clearance.  Mild hematuria monitor   Mild delirium: Patient with stroke, she is at times forgetful at home and does get confused as per the daughter, minimize psychotropic medication continue supportive care and hydration.  Mental is currently at baseline   Hypertension-BP stable.  cont losartan. History of previous CVA with left side hemiplegia/bedbound-on eliquis History of CVTA S/P  IVC filter: Eliquis resumed after GI clearance and after discussion with patient and patient's daughter about risk benefits.    Peripheral neuropathy on pregabalin Class III Obesity:Patient's Body mass index is 40.22 kg/m. : Will benefit with PCP follow-up, weight loss  healthy lifestyle and outpatient sleep evaluation.    Consults:  Follows with Subjective: Alert awake oriented resting comfortably no BM since 1 episode yesterday and had one today Stable for discharge today  Discharge Exam: Vitals:   01/23/22 1411 01/23/22 2015  BP: (!) 134/50 123/69  Pulse: 68 68  Resp: 18 20  Temp: 97.6 F (36.4 C) 97.8 F (36.6 C)  SpO2: 99% 97%   General: Pt is alert, awake, not in acute distress Cardiovascular: RRR, S1/S2 +, no rubs, no gallops Respiratory: CTA bilaterally, no wheezing,  no rhonchi Abdominal: Soft, NT, ND, bowel sounds + Extremities: no edema, no cyanosis  Discharge Instructions  Discharge Instructions     Discharge instructions   Complete by: As directed    Follow-up with your primary care doctor in 1 week.  Please call call MD or return to ER for similar or worsening recurring problem that brought you to hospital or if any fever,nausea/vomiting,abdominal pain, uncontrolled pain, chest pain,  shortness of breath or any other alarming symptoms.  Please follow-up your doctor as instructed in a week time and call the office for appointment.  Please avoid alcohol, smoking, or any other illicit substance and maintain healthy habits including taking your regular medications as prescribed.  You were cared for by a hospitalist during your hospital stay. If you have any questions about your discharge medications or the care you received while you were in the hospital after you are discharged, you can call the unit and ask to speak with the hospitalist on call if the hospitalist that took care of you is not available.  Once you are discharged, your primary care physician will handle any further medical issues. Please note that NO REFILLS for any discharge medications will be authorized once you are discharged, as it is imperative that you return to your primary care physician (or establish a relationship with a primary care physician if you do not have one) for your aftercare needs so that they can reassess your need for medications and monitor your lab values   Increase activity slowly   Complete by: As directed       Allergies as of 01/24/2022       Reactions   Codeine Other (See Comments)   Passed out   Penicillins Other (See Comments)   Passed out Has patient had a PCN reaction causing immediate rash, facial/tongue/throat swelling, SOB or lightheadedness with hypotension: Yes Has patient had a PCN reaction causing severe rash involving mucus membranes or  skin necrosis: Unk Has patient had a PCN reaction that required hospitalization: No Has patient had a PCN reaction occurring within the last 10 years: No If all of the above answers are "NO", then may proceed with Cephalosporin use.        Medication List     TAKE these medications    acidophilus Caps capsule Take 2 capsules by mouth 3 (three) times daily for 7 days.   albuterol 108 (90 Base) MCG/ACT inhaler Commonly known as: VENTOLIN HFA Inhale 1-2 puffs into the lungs every 6 (six) hours as needed for wheezing or shortness of breath.   apixaban 5 MG Tabs tablet Commonly known as: ELIQUIS Take 5 mg by mouth 2 (two) times daily.   atorvastatin 20 MG tablet Commonly known as: LIPITOR Take 1 tablet (20 mg total) by mouth daily. What changed: when to take this   diclofenac sodium 1 % Gel Commonly known as: VOLTAREN Apply 1 application topically 4 (four) times daily as needed (knee and hand pain).   escitalopram 10 MG tablet Commonly known as: LEXAPRO Take 10 mg by mouth at bedtime.   loperamide 2 MG capsule Commonly known as: IMODIUM Take 1 capsule (2 mg total) by mouth as needed for diarrhea or loose stools.   losartan 25 MG tablet Commonly known as: COZAAR Take 25 mg by mouth daily.   meclizine  25 MG tablet Commonly known as: ANTIVERT Take 25 mg by mouth 3 (three) times daily as needed for dizziness. As needed for vertigo   pregabalin 100 MG capsule Commonly known as: LYRICA Take 100 mg by mouth 3 (three) times daily.        Follow-up Information     Nolene Ebbs, MD Follow up in 1 week(s).   Specialty: Internal Medicine Contact information: [place] [address] [phone]                Allergies  Allergen Reactions   Codeine Other (See Comments)    Passed out   Penicillins Other (See Comments)    Passed out Has patient had a PCN reaction causing immediate rash, facial/tongue/throat swelling, SOB or  lightheadedness with hypotension: Yes Has patient had a PCN reaction causing severe rash involving mucus membranes or skin necrosis: Unk Has patient had a PCN reaction that required hospitalization: No Has patient had a PCN reaction occurring within the last 10 years: No If all of the above answers are "NO", then may proceed with Cephalosporin use.     The results of significant diagnostics from this hospitalization (including imaging, microbiology, ancillary and laboratory) are listed below for reference.    Microbiology: Recent Results (from the past 240 hour(s))  SARS Coronavirus 2 by RT PCR (hospital order, performed in [facility] hospital lab) *cepheid single result test* Anterior Nasal Swab     Status: None   Collection Time: 01/21/22  7:16 PM   Specimen: Anterior Nasal Swab  Result Value Ref Range Status   SARS Coronavirus 2 by RT PCR NEGATIVE NEGATIVE Final    Comment: (NOTE) SARS-CoV-2 target nucleic acids are NOT DETECTED.  The SARS-CoV-2 RNA is generally detectable in upper and lower respiratory specimens during the acute phase of infection. The lowest concentration of SARS-CoV-2 viral copies this assay can detect is 250 copies / mL. A negative result does not preclude SARS-CoV-2 infection and should not be used as the sole basis for treatment or other patient management decisions.  A negative result may occur with improper specimen collection / handling, submission of specimen other than nasopharyngeal swab, presence of viral mutation(s) within the areas targeted by this assay, and inadequate number of viral copies (<250 copies / mL). A negative result must be combined with clinical observations, patient history, and epidemiological information.  Fact Sheet for Patients:   https://www.patel.info/  Fact Sheet for Healthcare Providers: https://hall.com/  This test is not yet approved or  cleared by the [place] FDA  and has been authorized for detection and/or diagnosis of SARS-CoV-2 by FDA under an Emergency Use Authorization (EUA).  This EUA will remain in effect (meaning this test can be used) for the duration of the COVID-19 declaration under Section 564(b)(1) of the Act, 21 U.S.C. section 360bbb-3(b)(1), unless the authorization is terminated or revoked sooner.  Performed at [facility], [place] [address], [place],  [address]   Gastrointestinal Panel by PCR , Stool     Status: None   Collection Time: 01/21/22  8:18 PM   Specimen: Stool  Result Value Ref Range Status   Campylobacter species NOT DETECTED NOT DETECTED Final   Plesimonas shigelloides NOT DETECTED NOT DETECTED Final   Salmonella species NOT DETECTED NOT DETECTED Final   Yersinia enterocolitica NOT DETECTED NOT DETECTED Final   Vibrio species NOT DETECTED NOT DETECTED Final   Vibrio cholerae NOT DETECTED NOT DETECTED Final   Enteroaggregative E coli (  EAEC) NOT DETECTED NOT DETECTED Final   Enteropathogenic E coli (EPEC) NOT DETECTED NOT DETECTED Final   Enterotoxigenic E coli (ETEC) NOT DETECTED NOT DETECTED Final   Shiga like toxin producing E coli (STEC) NOT DETECTED NOT DETECTED Final   Shigella/Enteroinvasive E coli (EIEC) NOT DETECTED NOT DETECTED Final   Cryptosporidium NOT DETECTED NOT DETECTED Final   Cyclospora cayetanensis NOT DETECTED NOT DETECTED Final   Entamoeba histolytica NOT DETECTED NOT DETECTED Final   Giardia lamblia NOT DETECTED NOT DETECTED Final   Adenovirus F40/41 NOT DETECTED NOT DETECTED Final   Astrovirus NOT DETECTED NOT DETECTED Final   Norovirus GI/GII NOT DETECTED NOT DETECTED Final   Rotavirus A NOT DETECTED NOT DETECTED Final   Sapovirus (I, II, IV, and V) NOT DETECTED NOT DETECTED Final    Comment: Performed at [facility], [address] [place], [place] [address]  C Difficile Quick Screen w PCR reflex     Status: None   Collection Time: 01/21/22  8:18 PM   Result Value Ref Range Status   C Diff antigen NEGATIVE NEGATIVE Final   C Diff toxin NEGATIVE NEGATIVE Final   C Diff interpretation No C. difficile detected.  Final    Comment: Performed at [facility], [place] [address], [place], [place] [address]    Procedures/Studies: CT ABDOMEN PELVIS W CONTRAST  Result Date: 01/21/2022 CLINICAL DATA:  Diarrhea for the past 2 days. Left-sided hemiplegia. Large amount of non-bloody diarrhea for the past 2 days. EXAM: CT ABDOMEN AND PELVIS WITH CONTRAST TECHNIQUE: Multidetector CT imaging of the abdomen and pelvis was performed using the standard protocol following bolus administration of intravenous contrast. RADIATION DOSE REDUCTION: This exam was performed according to the departmental dose-optimization program which includes automated exposure control, adjustment of the mA and/or kV according to patient size and/or use of iterative reconstruction technique. CONTRAST:  15m OMNIPAQUE IOHEXOL 300 MG/ML  SOLN COMPARISON:  None Available. FINDINGS: Lower chest: Unremarkable. Hepatobiliary: The lateral segment of the left lobe of the liver is enlarged by large mass with central low density and peripheral contrast puddling. This measures 12.1 x 9.2 cm on image number 20/2. There is a smaller similar-appearing mass in the posterior right lobe of the liver, measuring 2.0 cm in maximum diameter on image number 24/2. Multiple gallstones in the gallbladder. The largest measures 1.1 cm in maximum diameter. No gallbladder wall thickening or pericholecystic fluid. Pancreas: Moderate to marked diffuse pancreatic atrophy. Spleen: Normal in size without focal abnormality. Adrenals/Urinary Tract: Normal appearing adrenal glands. Elongated upper pole right renal calculus measuring 1.1 cm on coronal image number 95/6. Similar-appearing calculus in the upper pole of the left kidney measuring 9 mm on image number 154/7. No hydronephrosis on either side. Unremarkable  ureters and urinary bladder. Stomach/Bowel: Small hiatal hernia. Large number of colonic diverticula large number of diverticula throughout the majority of the colon without evidence of diverticulitis. Unremarkable small bowel. No evidence of appendicitis. Vascular/Lymphatic: Atheromatous arterial calcifications without aneurysm. No enlarged lymph nodes. Inferior vena cava filter with its tip inferior to the renal veins. Reproductive: Status post hysterectomy. No adnexal masses. Other: Small amount of free peritoneal fluid in the pelvis. Musculoskeletal: Right hip prosthesis. Lumbar and lower thoracic spine degenerative changes. IMPRESSION: 1. No acute abnormality. 2. Large mass in the lateral segment of the left lobe of the liver with a smaller similar-appearing mass in the posterior right lobe of the liver. These are compatible with hemangiomas. 3. Cholelithiasis without evidence of  cholecystitis. 4. Extensive colonic diverticulosis. 5. Small hiatal hernia. 6. Bilateral nonobstructing renal calculi. 7. Small amount of free peritoneal fluid in the pelvis. Electronically Signed   By: Claudie Revering M.D.   On: 01/21/2022 20:08   DG Chest Portable 1 View  Result Date: 01/21/2022 CLINICAL DATA:  Diarrhea and weakness for several days. EXAM: PORTABLE CHEST 1 VIEW COMPARISON:  01/25/2021 FINDINGS: The heart size and mediastinal contours are within normal limits. Both lungs are clear. The visualized skeletal structures are unremarkable. IMPRESSION: No active disease. Electronically Signed   By: Marlaine Hind M.D.   On: 01/21/2022 16:26    Labs: BNP (last 3 results) No results for input(s): "BNP" in the last 8760 hours. Basic Metabolic Panel: Recent Labs  Lab 01/21/22 1543 01/22/22 0548 01/23/22 0647 01/24/22 0506  NA 138 137 138 138  K 4.0 3.9 4.0 3.9  CL 107 108 107 108  CO2 '22 24 25 22  '$ GLUCOSE 106* 108* 102* 105*  BUN '15 14 14 14  '$ CREATININE 1.05* 0.86 0.95 0.98  CALCIUM 9.1 8.9 8.8* 9.0  MG  --    --  2.0  --    Liver Function Tests: Recent Labs  Lab 01/21/22 1543  AST 26  ALT 19  ALKPHOS 82  BILITOT 0.9  PROT 7.0  ALBUMIN 3.2*   Recent Labs  Lab 01/21/22 1543  LIPASE 24   No results for input(s): "AMMONIA" in the last 168 hours. CBC: Recent Labs  Lab 01/21/22 1543 01/22/22 0548 01/23/22 0647 01/24/22 0506  WBC 9.7 9.0 7.4 6.5  NEUTROABS 6.5  --   --   --   HGB 11.2* 11.3* 11.4* 11.8*  HCT 35.7* 36.7 36.9 38.5  MCV 87.1 89.3 87.6 88.3  PLT PLATELET CLUMPS NOTED ON SMEAR, UNABLE TO ESTIMATE 172 186 178   Cardiac Enzymes: No results for input(s): "CKTOTAL", "CKMB", "CKMBINDEX", "TROPONINI" in the last 168 hours. BNP: Invalid input(s): "POCBNP" CBG: No results for input(s): "GLUCAP" in the last 168 hours. D-Dimer No results for input(s): "DDIMER" in the last 72 hours. Hgb A1c No results for input(s): "HGBA1C" in the last 72 hours. Lipid Profile No results for input(s): "CHOL", "HDL", "LDLCALC", "TRIG", "CHOLHDL", "LDLDIRECT" in the last 72 hours. Thyroid function studies No results for input(s): "TSH", "T4TOTAL", "T3FREE", "THYROIDAB" in the last 72 hours.  Invalid input(s): "FREET3" Anemia work up Recent Labs    01/22/22 0548  VITAMINB12 361  FOLATE 9.6  FERRITIN 45  TIBC 317  IRON 35  RETICCTPCT 1.4   Urinalysis    Component Value Date/Time   COLORURINE YELLOW 01/21/2022 [place] (A) 01/21/2022 1854   LABSPEC 1.021 01/21/2022 1854   PHURINE 5.0 01/21/2022 1854   GLUCOSEU NEGATIVE 01/21/2022 1854   [place] (A) 01/21/2022 [place] 01/21/2022 [place] 01/21/2022 [place] 01/21/2022 1854   UROBILINOGEN 1.0 11/07/2009 2127   NITRITE NEGATIVE 01/21/2022 1854   LEUKOCYTESUR TRACE (A) 01/21/2022 1854   Sepsis Labs Recent Labs  Lab 01/21/22 1543 01/22/22 0548 01/23/22 0647 01/24/22 0506  WBC 9.7 9.0 7.4 6.5   Microbiology Recent Results (from the past 240  hour(s))  SARS Coronavirus 2 by RT PCR (hospital order, performed in [facility] hospital lab) *cepheid single result test* Anterior Nasal Swab     Status: None   Collection Time: 01/21/22  7:16 PM   Specimen: Anterior Nasal Swab  Result Value Ref Range Status  SARS Coronavirus 2 by RT PCR NEGATIVE NEGATIVE Final    Comment: (NOTE) SARS-CoV-2 target nucleic acids are NOT DETECTED.  The SARS-CoV-2 RNA is generally detectable in upper and lower respiratory specimens during the acute phase of infection. The lowest concentration of SARS-CoV-2 viral copies this assay can detect is 250 copies / mL. A negative result does not preclude SARS-CoV-2 infection and should not be used as the sole basis for treatment or other patient management decisions.  A negative result may occur with improper specimen collection / handling, submission of specimen other than nasopharyngeal swab, presence of viral mutation(s) within the areas targeted by this assay, and inadequate number of viral copies (<250 copies / mL). A negative result must be combined with clinical observations, patient history, and epidemiological information.  Fact Sheet for Patients:   https://www.patel.info/  Fact Sheet for Healthcare Providers: https://hall.com/  This test is not yet approved or  cleared by the [place] FDA and has been authorized for detection and/or diagnosis of SARS-CoV-2 by FDA under an Emergency Use Authorization (EUA).  This EUA will remain in effect (meaning this test can be used) for the duration of the COVID-19 declaration under Section 564(b)(1) of the Act, 21 U.S.C. section 360bbb-3(b)(1), unless the authorization is terminated or revoked sooner.  Performed at [facility], [place] [address], [place], [place] [address]   Gastrointestinal Panel by PCR , Stool     Status: None   Collection Time: 01/21/22  8:18 PM   Specimen: Stool  Result  Value Ref Range Status   Campylobacter species NOT DETECTED NOT DETECTED Final   Plesimonas shigelloides NOT DETECTED NOT DETECTED Final   Salmonella species NOT DETECTED NOT DETECTED Final   Yersinia enterocolitica NOT DETECTED NOT DETECTED Final   Vibrio species NOT DETECTED NOT DETECTED Final   Vibrio cholerae NOT DETECTED NOT DETECTED Final   Enteroaggregative E coli (EAEC) NOT DETECTED NOT DETECTED Final   Enteropathogenic E coli (EPEC) NOT DETECTED NOT DETECTED Final   Enterotoxigenic E coli (ETEC) NOT DETECTED NOT DETECTED Final   Shiga like toxin producing E coli (STEC) NOT DETECTED NOT DETECTED Final   Shigella/Enteroinvasive E coli (EIEC) NOT DETECTED NOT DETECTED Final   Cryptosporidium NOT DETECTED NOT DETECTED Final   Cyclospora cayetanensis NOT DETECTED NOT DETECTED Final   Entamoeba histolytica NOT DETECTED NOT DETECTED Final   Giardia lamblia NOT DETECTED NOT DETECTED Final   Adenovirus F40/41 NOT DETECTED NOT DETECTED Final   Astrovirus NOT DETECTED NOT DETECTED Final   Norovirus GI/GII NOT DETECTED NOT DETECTED Final   Rotavirus A NOT DETECTED NOT DETECTED Final   Sapovirus (I, II, IV, and V) NOT DETECTED NOT DETECTED Final    Comment: Performed at [facility], [place] [place], [place] [address]  C Difficile Quick Screen w PCR reflex     Status: None   Collection Time: 01/21/22  8:18 PM  Result Value Ref Range Status   C Diff antigen NEGATIVE NEGATIVE Final   C Diff toxin NEGATIVE NEGATIVE Final   C Diff interpretation No C. difficile detected.  Final    Comment: Performed at [facility], [place] [address], [place], [place] [address]     Time coordinating discharge: 25 minutes  SIGNED: Antonieta Pert, MD  [facility] 01/24/2022, 1:22 PM  If 7PM-7AM, please contact night-coverage [email or web address]

## 2022-01-24 NOTE — Progress Notes (Signed)
Verbal AVS instructions given to [name], via phone, who let this RN know that they have a PCP follow up on the 19th of October and is unsure can be seen sooner. [name] and [name] verbalized understanding of AVS education. Education provided on heart healthy diet. Patient to be transported via [facility] to home.

## 2022-01-27 ENCOUNTER — Telehealth: Payer: Self-pay | Admitting: Podiatry

## 2022-01-27 NOTE — Telephone Encounter (Signed)
Pts daughter called pt was scheduled to see Dr Prudence Davidson  next week and just got out of the hospital and they need to cxl the appt. She stated her feet look really good and they did not need to come in at this time.  Pts daughter stated that she will need a afternoon appt as pt needs a lift to get her up and a special van to transport with 3 days notice. Pts daughter is to call back to r/s and will ask for an afternoon appt

## 2022-02-04 ENCOUNTER — Ambulatory Visit: Payer: Medicare Other | Admitting: Podiatry

## 2022-03-03 ENCOUNTER — Telehealth: Payer: Self-pay | Admitting: Orthopaedic Surgery

## 2022-03-03 NOTE — Telephone Encounter (Signed)
Patient's daughter [name] called asked if patient can get the gel injection in both knees. [name] said the right knee is bothering her quite a bit. The number to contact [name] is [phone]

## 2022-03-04 NOTE — Telephone Encounter (Signed)
Talked with patient's daughter [name] about gel injections for patient.  Next available gel injections would need to be after 04/13/2022.  Will submit March 18, 2022.

## 2022-03-22 ENCOUNTER — Telehealth: Payer: Self-pay

## 2022-03-22 NOTE — Telephone Encounter (Signed)
VOB submitted for [facility], bilateral knee Appt.needs to be after 12/27/20233.

## 2022-03-24 ENCOUNTER — Telehealth: Payer: Self-pay

## 2022-03-24 DIAGNOSIS — M1712 Unilateral primary osteoarthritis, left knee: Secondary | ICD-10-CM

## 2022-03-24 DIAGNOSIS — M1711 Unilateral primary osteoarthritis, right knee: Secondary | ICD-10-CM

## 2022-03-24 NOTE — Telephone Encounter (Signed)
Talked with patient's daughter and scheduled appointment for gel injection

## 2022-03-24 NOTE — Telephone Encounter (Signed)
Pt daughter returning call... Pt daughter would like to schedule appt for pt but have other questions... Pt daughter requesting callback.Marland KitchenMarland Kitchen

## 2022-03-24 NOTE — Telephone Encounter (Signed)
Called and left a VM for patient's daughter to CB to schedule for gel injection with Dr. Ninfa Linden or Artis Delay after 04/13/2022, but before 04/18/2022 if patient would like to proceed.  Check referral tab

## 2022-04-14 ENCOUNTER — Encounter: Payer: Self-pay | Admitting: Orthopaedic Surgery

## 2022-04-14 ENCOUNTER — Ambulatory Visit (INDEPENDENT_AMBULATORY_CARE_PROVIDER_SITE_OTHER): Payer: Medicare Other | Admitting: Orthopaedic Surgery

## 2022-04-14 DIAGNOSIS — M1712 Unilateral primary osteoarthritis, left knee: Secondary | ICD-10-CM | POA: Diagnosis not present

## 2022-04-14 DIAGNOSIS — M1711 Unilateral primary osteoarthritis, right knee: Secondary | ICD-10-CM

## 2022-04-14 MED ORDER — HYLAN G-F 20 48 MG/6ML IX SOSY
48.0000 mg | PREFILLED_SYRINGE | INTRA_ARTICULAR | Status: AC | PRN
  Administered 2022-04-14: 48 mg via INTRA_ARTICULAR

## 2022-04-14 NOTE — Progress Notes (Signed)
   Procedure Note  Patient: [name]             Date of Birth: 05/02/1928           MRN: [id number]             Visit Date: 04/14/2022  Procedures: Visit Diagnoses:  1. Primary osteoarthritis of right knee   2. Primary osteoarthritis of left knee     Large Joint Inj: R knee on 04/14/2022 1:10 PM Indications: pain and diagnostic evaluation Details: 22 G 1.5 in needle, superolateral approach  Arthrogram: No  Medications: 48 mg Hylan G-F 20 48 MG/6ML Outcome: tolerated well, no immediate complications Procedure, treatment alternatives, risks and benefits explained, specific risks discussed. Consent was given by the patient. Immediately prior to procedure a time out was called to verify the correct patient, procedure, equipment, support staff and site/side marked as required. Patient was prepped and draped in the usual sterile fashion.    Large Joint Inj: L knee on 04/14/2022 1:10 PM Indications: pain and diagnostic evaluation Details: 22 G 1.5 in needle, superolateral approach  Arthrogram: No  Medications: 48 mg Hylan G-F 20 48 MG/6ML Outcome: tolerated well, no immediate complications Procedure, treatment alternatives, risks and benefits explained, specific risks discussed. Consent was given by the patient. Immediately prior to procedure a time out was called to verify the correct patient, procedure, equipment, support staff and site/side marked as required. Patient was prepped and draped in the usual sterile fashion.     The patient is here today for bilateral Synvisc 1 injections in her knees to treat the pain from osteoarthritis.  She is 88 years old.  She does not ambulate.  I did place Synvisc 1 in both knees without difficulty.  She knows to wait at least 3 months before steroid injection in 6 months before these injections.  Follow-up otherwise as as needed.

## 2022-06-16 ENCOUNTER — Other Ambulatory Visit: Payer: Self-pay | Admitting: Internal Medicine

## 2022-06-22 LAB — TIQ-MISC

## 2022-07-02 LAB — TSH: TSH: 1.33 mIU/L (ref 0.40–4.50)

## 2022-07-25 ENCOUNTER — Ambulatory Visit: Payer: Medicare Other | Admitting: Podiatry

## 2022-08-01 ENCOUNTER — Ambulatory Visit (INDEPENDENT_AMBULATORY_CARE_PROVIDER_SITE_OTHER): Payer: 59 | Admitting: Podiatry

## 2022-08-01 ENCOUNTER — Encounter: Payer: Self-pay | Admitting: Podiatry

## 2022-08-01 DIAGNOSIS — M79675 Pain in left toe(s): Secondary | ICD-10-CM | POA: Diagnosis not present

## 2022-08-01 DIAGNOSIS — B351 Tinea unguium: Secondary | ICD-10-CM | POA: Diagnosis not present

## 2022-08-01 DIAGNOSIS — M79674 Pain in right toe(s): Secondary | ICD-10-CM | POA: Diagnosis not present

## 2022-08-01 NOTE — Progress Notes (Signed)
This patient returns to my office for at risk foot care.  This patient requires this care by a professional since this patient will be at risk due to having neuropathy and coagulation defect..  This patient is unable to cut nails herself since the patient cannot reach her nails.These nails are painful walking and wearing shoes. She presents to the office with her daughter in a wheelchair. This patient presents for at risk foot care today.  Patient has not been seen in over 9 months.  General Appearance  Alert, conversant and in no acute stress.  Vascular  Dorsalis pedis and posterior tibial  pulses are weakly  palpable  bilaterally.  Capillary return is within normal limits  bilaterally. Temperature is within normal limits  bilaterally.  Neurologic  Senn-Weinstein monofilament wire test within normal limits  bilaterally. Muscle power within normal limits bilaterally.  Nails Thick disfigured discolored nails with subungual debris  from hallux to fifth toes bilaterally. No evidence of bacterial infection or drainage bilaterally.  Orthopedic  No limitations of motion  feet .  No crepitus or effusions noted.  No bony pathology or digital deformities noted.  Skin  normotropic skin with no porokeratosis noted bilaterally.  No signs of infections or ulcers noted.     Onychomycosis  Pain in right toes  Pain in left toes  Consent was obtained for treatment procedures.   Mechanical debridement of nails 1-5  bilaterally performed with a nail nipper.  Filed with dremel without incident. Third toe left foot was cauterized to stop bleeding subungually.  She is taking eliquis.  The third toe was bandged with compression on the bleeding site.   Return office visit   3 months                   Told patient to return for periodic foot care and evaluation due to potential at risk complications.   Helane Gunther DPM

## 2022-08-17 ENCOUNTER — Telehealth: Payer: Self-pay | Admitting: *Deleted

## 2022-08-17 NOTE — Telephone Encounter (Signed)
Daughter is calling because she was seen 3 weeks ago and had noticed dark bruising around the treat toenail,please advise./schedule to come in.

## 2022-08-19 NOTE — Telephone Encounter (Signed)
Spoke with patient's daughter,scheduled upcoming appointment 08/22/22, approved by physician,confirmed w daughter. She was given instructions to apply peroxide until appointment per physician.

## 2022-08-22 ENCOUNTER — Encounter: Payer: Self-pay | Admitting: Podiatry

## 2022-08-22 ENCOUNTER — Ambulatory Visit (INDEPENDENT_AMBULATORY_CARE_PROVIDER_SITE_OTHER): Payer: 59 | Admitting: Podiatry

## 2022-08-22 DIAGNOSIS — R234 Changes in skin texture: Secondary | ICD-10-CM

## 2022-08-22 NOTE — Progress Notes (Signed)
This patient presents to the office concerned about her third toe left foot.  He daughter brought her mother into the office 3 weeks ago for nail care.  He daughter is concerned because a  black skin lesion developed  on the tip of third toe left foot.  She denies drainage and the skin lesion hs mild pain.   The toe has normal color .  She presents to the office for evaluation of skin lesion.  General Appearance  Alert, conversant and in no acute stress.  Vascular  Dorsalis pedis and posterior tibial  pulses are  weakly palpable  bilaterally.  Capillary return is within normal limits  bilaterally. Temperature is within normal limits  bilaterally.  Neurologic  Senn-Weinstein monofilament wire test within normal limits  bilaterally. Muscle power within normal limits bilaterally.  Nails Thick disfigured discolored nails with subungual debris  from hallux to fifth toes bilaterally. No evidence of bacterial infection or drainage bilaterally.  Orthopedic  No limitations of motion  feet .  No crepitus or effusions noted.  No bony pathology .   Hammer toes  B/L  Skin  normotropic skin with no porokeratosis noted bilaterally.  No signs of infections or ulcers noted.    Black eschar has developed on the tip of third toe left foot.  Patient tip of third toe was cauterized at the time of nail care 3 weeks ago.  She has now developed a black eschar with no evidence of redness or swelling to the toe.  The eschar is scab-like.  It should heal uneventfully.  If the problem wornens she should return to the office. RTC 3 months.   Helane Gunther DPM

## 2022-10-11 ENCOUNTER — Telehealth: Payer: Self-pay | Admitting: Orthopaedic Surgery

## 2022-10-11 NOTE — Telephone Encounter (Signed)
Pt's daughter Britta Mccreedy called to submit for pt bil knee gel injection for Synvisc. Please call Pt's daughter when approved at [phone].

## 2022-10-13 NOTE — Telephone Encounter (Signed)
Holding for April. I no longer have a synvisc login

## 2022-10-18 NOTE — Telephone Encounter (Signed)
VOB submitted for Durolane, bilateral knee  

## 2022-10-28 ENCOUNTER — Other Ambulatory Visit: Payer: Self-pay

## 2022-10-28 DIAGNOSIS — M1712 Unilateral primary osteoarthritis, left knee: Secondary | ICD-10-CM

## 2022-10-28 DIAGNOSIS — M1711 Unilateral primary osteoarthritis, right knee: Secondary | ICD-10-CM

## 2022-10-31 ENCOUNTER — Ambulatory Visit: Payer: 59 | Admitting: Podiatry

## 2022-10-31 ENCOUNTER — Encounter: Payer: Self-pay | Admitting: Physician Assistant

## 2022-10-31 ENCOUNTER — Ambulatory Visit: Payer: 59 | Admitting: Physician Assistant

## 2022-10-31 DIAGNOSIS — M1712 Unilateral primary osteoarthritis, left knee: Secondary | ICD-10-CM

## 2022-10-31 DIAGNOSIS — M170 Bilateral primary osteoarthritis of knee: Secondary | ICD-10-CM | POA: Diagnosis not present

## 2022-10-31 DIAGNOSIS — M1711 Unilateral primary osteoarthritis, right knee: Secondary | ICD-10-CM

## 2022-10-31 MED ORDER — SODIUM HYALURONATE 60 MG/3ML IX PRSY
60.0000 mg | PREFILLED_SYRINGE | INTRA_ARTICULAR | Status: AC | PRN
  Administered 2022-10-31: 60 mg via INTRA_ARTICULAR

## 2022-10-31 NOTE — Progress Notes (Signed)
   Procedure Note  Patient: [name]             Date of Birth: [birth year]           MRN: [id number]             Visit Date: 10/31/2022 HPI: Comes in today for bilateral knee Durolane injections. States she has constant pain in both knees. But feels that the supplemental injections help with the knee pain.   Physical exam : Bilateral knees: No abnormal warth or erythema. Very limited painful passive range  both knees.  Procedures: Visit Diagnoses:  1. Primary osteoarthritis of right knee   2. Primary osteoarthritis of left knee     Large Joint Inj: bilateral knee on 10/31/2022 9:05 PM Indications: pain Details: 22 G 1.5 in needle, anterolateral approach  Arthrogram: No  Medications (Right): 60 mg Sodium Hyaluronate 60 MG/3ML Medications (Left): 60 mg Sodium Hyaluronate 60 MG/3ML Outcome: tolerated well, no immediate complications Procedure, treatment alternatives, risks and benefits explained, specific risks discussed. Consent was given by the patient. Immediately prior to procedure a time out was called to verify the correct patient, procedure, equipment, support staff and site/side marked as required. Patient was prepped and draped in the usual sterile fashion.    Plan:  Follow up as needed. Understands to wait 6 months between visco supplementation injections.

## 2022-11-02 ENCOUNTER — Telehealth: Payer: Self-pay | Admitting: Orthopaedic Surgery

## 2022-11-02 ENCOUNTER — Telehealth: Payer: Self-pay

## 2022-11-02 NOTE — Telephone Encounter (Signed)
Pt Daughter called back in stating they will call [facility] to get a ride to the same day clinic tomorrow so she can be seen by MA Persons as instructed in prev notes, please advise they will be there between 10 and 11 depending on transportation please inform MA Persons of situation

## 2022-11-02 NOTE — Telephone Encounter (Signed)
Please advise. Dr. Magnus Ivan said to see if Nita Sells could see it sooner

## 2022-11-02 NOTE — Telephone Encounter (Signed)
Patient's daughter called wanting to know if it is normal for fluid to be draining out of left knee 2 days later?  Left knee is draining all day and night and bandage is getting saturated over time, but uses towels and underpad.  CB# [phone].  Please advise.  Thank you

## 2022-11-02 NOTE — Telephone Encounter (Signed)
Called pt. She will go to the [facility] office to the same day clinic to have [name] look at it

## 2022-11-03 ENCOUNTER — Ambulatory Visit (INDEPENDENT_AMBULATORY_CARE_PROVIDER_SITE_OTHER): Payer: 59 | Admitting: Physician Assistant

## 2022-11-03 ENCOUNTER — Encounter: Payer: Self-pay | Admitting: Physician Assistant

## 2022-11-03 DIAGNOSIS — M1711 Unilateral primary osteoarthritis, right knee: Secondary | ICD-10-CM

## 2022-11-03 DIAGNOSIS — M1712 Unilateral primary osteoarthritis, left knee: Secondary | ICD-10-CM

## 2022-11-03 NOTE — Progress Notes (Signed)
HPI: [name] comes in today after having Durolane injections both knees on 10/31/2022.  She states she had significant amount of drainage from the left knee which was done from a superior lateral approach.  Her daughter states that it was clear to yellowish liquid with some blood in it.  She states that the drainage soaked the bandage.  Physical exam: Bilateral knees no abnormal warmth erythema or effusion.  No ecchymosis.  No acute active drainage.  Unable to express any drainage from the injection site on the left knee.  Significant edema bilateral lower extremities left greater than right.  Impression: Bilateral knee osteoarthritis  Plan: Discussed with her wrapping the legs with Ace bandage during the day removing them in the evenings.  Elevation of the legs to help with swelling.  Also active range of motion of the ankles and feet is she is able to perform given her history of CVA and hemiplegia on the left side.  Follow-up with [place] as needed notes that 6 months between supplemental injections.

## 2022-12-15 ENCOUNTER — Telehealth: Payer: Self-pay | Admitting: Physician Assistant

## 2022-12-15 NOTE — Telephone Encounter (Signed)
Patient daughter called and need a prescription for Mupirocin Ointment USP, 2%. He gives her this for the shots every 6 months. CB#[phone]

## 2023-01-09 ENCOUNTER — Emergency Department (HOSPITAL_COMMUNITY): Payer: 59

## 2023-01-09 ENCOUNTER — Inpatient Hospital Stay (HOSPITAL_COMMUNITY)
Admission: EM | Admit: 2023-01-09 | Discharge: 2023-01-13 | DRG: 602 | Disposition: A | Discharge status: Home Health | Payer: 59 | Attending: Internal Medicine | Admitting: Internal Medicine

## 2023-01-09 ENCOUNTER — Encounter (HOSPITAL_COMMUNITY): Payer: Self-pay

## 2023-01-09 DIAGNOSIS — Z7401 Bed confinement status: Secondary | ICD-10-CM | POA: Diagnosis not present

## 2023-01-09 DIAGNOSIS — L03116 Cellulitis of left lower limb: Secondary | ICD-10-CM | POA: Diagnosis present

## 2023-01-09 DIAGNOSIS — F32A Depression, unspecified: Secondary | ICD-10-CM | POA: Diagnosis present

## 2023-01-09 DIAGNOSIS — Z96641 Presence of right artificial hip joint: Secondary | ICD-10-CM | POA: Diagnosis present

## 2023-01-09 DIAGNOSIS — E785 Hyperlipidemia, unspecified: Secondary | ICD-10-CM | POA: Diagnosis present

## 2023-01-09 DIAGNOSIS — L0390 Cellulitis, unspecified: Secondary | ICD-10-CM | POA: Diagnosis present

## 2023-01-09 DIAGNOSIS — D649 Anemia, unspecified: Secondary | ICD-10-CM | POA: Diagnosis present

## 2023-01-09 DIAGNOSIS — I10 Essential (primary) hypertension: Secondary | ICD-10-CM | POA: Diagnosis present

## 2023-01-09 DIAGNOSIS — Z833 Family history of diabetes mellitus: Secondary | ICD-10-CM | POA: Diagnosis not present

## 2023-01-09 DIAGNOSIS — L89323 Pressure ulcer of left buttock, stage 3: Secondary | ICD-10-CM | POA: Diagnosis present

## 2023-01-09 DIAGNOSIS — E669 Obesity, unspecified: Secondary | ICD-10-CM | POA: Diagnosis present

## 2023-01-09 DIAGNOSIS — E872 Acidosis, unspecified: Secondary | ICD-10-CM | POA: Diagnosis present

## 2023-01-09 DIAGNOSIS — Z79899 Other long term (current) drug therapy: Secondary | ICD-10-CM | POA: Diagnosis not present

## 2023-01-09 DIAGNOSIS — L89223 Pressure ulcer of left hip, stage 3: Secondary | ICD-10-CM | POA: Diagnosis present

## 2023-01-09 DIAGNOSIS — Z880 Allergy status to penicillin: Secondary | ICD-10-CM | POA: Diagnosis not present

## 2023-01-09 DIAGNOSIS — Z7901 Long term (current) use of anticoagulants: Secondary | ICD-10-CM

## 2023-01-09 DIAGNOSIS — Z841 Family history of disorders of kidney and ureter: Secondary | ICD-10-CM

## 2023-01-09 DIAGNOSIS — L89222 Pressure ulcer of left hip, stage 2: Secondary | ICD-10-CM | POA: Diagnosis not present

## 2023-01-09 DIAGNOSIS — Z6841 Body Mass Index (BMI) 40.0 and over, adult: Secondary | ICD-10-CM | POA: Diagnosis not present

## 2023-01-09 DIAGNOSIS — L8990 Pressure ulcer of unspecified site, unspecified stage: Secondary | ICD-10-CM

## 2023-01-09 DIAGNOSIS — L089 Local infection of the skin and subcutaneous tissue, unspecified: Secondary | ICD-10-CM | POA: Diagnosis not present

## 2023-01-09 DIAGNOSIS — F419 Anxiety disorder, unspecified: Secondary | ICD-10-CM | POA: Diagnosis present

## 2023-01-09 DIAGNOSIS — Z86718 Personal history of other venous thrombosis and embolism: Secondary | ICD-10-CM

## 2023-01-09 DIAGNOSIS — Z885 Allergy status to narcotic agent status: Secondary | ICD-10-CM

## 2023-01-09 DIAGNOSIS — L03317 Cellulitis of buttock: Secondary | ICD-10-CM | POA: Diagnosis not present

## 2023-01-09 DIAGNOSIS — I69354 Hemiplegia and hemiparesis following cerebral infarction affecting left non-dominant side: Secondary | ICD-10-CM | POA: Diagnosis not present

## 2023-01-09 DIAGNOSIS — F17210 Nicotine dependence, cigarettes, uncomplicated: Secondary | ICD-10-CM | POA: Diagnosis present

## 2023-01-09 DIAGNOSIS — Z801 Family history of malignant neoplasm of trachea, bronchus and lung: Secondary | ICD-10-CM

## 2023-01-09 DIAGNOSIS — I639 Cerebral infarction, unspecified: Secondary | ICD-10-CM | POA: Diagnosis present

## 2023-01-09 DIAGNOSIS — M7989 Other specified soft tissue disorders: Secondary | ICD-10-CM | POA: Diagnosis not present

## 2023-01-09 LAB — CBC WITH DIFFERENTIAL/PLATELET
Abs Immature Granulocytes: 0.07 10*3/uL (ref 0.00–0.07)
Basophils Absolute: 0.1 10*3/uL (ref 0.0–0.1)
Basophils Relative: 0 %
Eosinophils Absolute: 0 10*3/uL (ref 0.0–0.5)
Eosinophils Relative: 0 %
HCT: 41.3 % (ref 36.0–46.0)
Hemoglobin: 12.7 g/dL (ref 12.0–15.0)
Immature Granulocytes: 1 %
Lymphocytes Relative: 10 %
Lymphs Abs: 1.4 10*3/uL (ref 0.7–4.0)
MCH: 28.5 pg (ref 26.0–34.0)
MCHC: 30.8 g/dL (ref 30.0–36.0)
MCV: 92.6 fL (ref 80.0–100.0)
Monocytes Absolute: 0.7 10*3/uL (ref 0.1–1.0)
Monocytes Relative: 5 %
Neutro Abs: 12.5 10*3/uL — ABNORMAL HIGH (ref 1.7–7.7)
Neutrophils Relative %: 84 %
Platelets: 195 10*3/uL (ref 150–400)
RBC: 4.46 MIL/uL (ref 3.87–5.11)
RDW: 17.2 % — ABNORMAL HIGH (ref 11.5–15.5)
WBC: 14.8 10*3/uL — ABNORMAL HIGH (ref 4.0–10.5)
nRBC: 0 % (ref 0.0–0.2)

## 2023-01-09 LAB — BASIC METABOLIC PANEL
Anion gap: 11 (ref 5–15)
BUN: 11 mg/dL (ref 8–23)
CO2: 21 mmol/L — ABNORMAL LOW (ref 22–32)
Calcium: 7.8 mg/dL — ABNORMAL LOW (ref 8.9–10.3)
Chloride: 109 mmol/L (ref 98–111)
Creatinine, Ser: 0.86 mg/dL (ref 0.44–1.00)
GFR, Estimated: >60 mL/min (ref >60)
Glucose, Bld: 141 mg/dL — ABNORMAL HIGH (ref 70–99)
Potassium: 3.6 mmol/L (ref 3.5–5.1)
Sodium: 141 mmol/L (ref 135–145)

## 2023-01-09 LAB — I-STAT CG4 LACTIC ACID, ED
Lactic Acid, Venous: 3.8 mmol/L (ref 0.5–1.9)
Lactic Acid, Venous: 2.8 mmol/L (ref 0.5–1.9)

## 2023-01-09 MED ORDER — APIXABAN 5 MG PO TABS
5.0000 mg | ORAL_TABLET | Freq: Two times a day (BID) | ORAL | Status: DC
  Administered 2023-01-10 – 2023-01-13 (×8): 5 mg via ORAL
  Filled 2023-01-09 (×9): qty 1

## 2023-01-09 MED ORDER — LACTATED RINGERS IV SOLN: INTRAVENOUS | Status: DC

## 2023-01-09 MED ORDER — NYSTATIN 100000 UNIT/GM EX POWD
Freq: Two times a day (BID) | CUTANEOUS | Status: DC
  Filled 2023-01-09: qty 15

## 2023-01-09 MED ORDER — CLINDAMYCIN PHOSPHATE 900 MG/50ML IV SOLN
900.0000 mg | Freq: Three times a day (TID) | INTRAVENOUS | Status: DC
  Administered 2023-01-10 (×2): 900 mg via INTRAVENOUS
  Filled 2023-01-09 (×3): qty 50

## 2023-01-09 MED ORDER — CLINDAMYCIN PHOSPHATE 900 MG/50ML IV SOLN
900.0000 mg | Freq: Once | INTRAVENOUS | Status: AC
  Administered 2023-01-09: 900 mg via INTRAVENOUS
  Filled 2023-01-09: qty 50

## 2023-01-09 MED ORDER — ACETAMINOPHEN 325 MG PO TABS
650.0000 mg | ORAL_TABLET | Freq: Four times a day (QID) | ORAL | Status: DC | PRN
  Administered 2023-01-10: 650 mg via ORAL
  Filled 2023-01-09: qty 2

## 2023-01-09 MED ORDER — ENOXAPARIN SODIUM 40 MG/0.4ML IJ SOSY: 40.0000 mg | PREFILLED_SYRINGE | INTRAMUSCULAR | Status: DC

## 2023-01-09 MED ORDER — SODIUM CHLORIDE 0.9% FLUSH
3.0000 mL | Freq: Two times a day (BID) | INTRAVENOUS | Status: DC
  Administered 2023-01-10 – 2023-01-12 (×6): 3 mL via INTRAVENOUS

## 2023-01-09 MED ORDER — POLYETHYLENE GLYCOL 3350 17 G PO PACK: 17.0000 g | PACK | Freq: Every day | ORAL | Status: DC | PRN

## 2023-01-09 MED ORDER — CLINDAMYCIN PHOSPHATE 600 MG/50ML IV SOLN: 600.0000 mg | Freq: Four times a day (QID) | INTRAVENOUS | Status: DC

## 2023-01-09 MED ORDER — CEFAZOLIN SODIUM-DEXTROSE 2-4 GM/100ML-% IV SOLN
2.0000 g | Freq: Two times a day (BID) | INTRAVENOUS | Status: DC
  Filled 2023-01-09: qty 100

## 2023-01-09 MED ORDER — ACETAMINOPHEN 650 MG RE SUPP: 650.0000 mg | Freq: Four times a day (QID) | RECTAL | Status: DC | PRN

## 2023-01-09 MED ORDER — SODIUM CHLORIDE 0.9 % IV BOLUS
2000.0000 mL | Freq: Once | INTRAVENOUS | Status: AC
  Administered 2023-01-09: 2000 mL via INTRAVENOUS

## 2023-01-09 MED ORDER — IOHEXOL 300 MG/ML  SOLN
100.0000 mL | Freq: Once | INTRAMUSCULAR | Status: AC | PRN
  Administered 2023-01-09: 100 mL via INTRAVENOUS

## 2023-01-09 MED ORDER — PREGABALIN 50 MG PO CAPS
100.0000 mg | ORAL_CAPSULE | Freq: Three times a day (TID) | ORAL | Status: DC
  Administered 2023-01-10 – 2023-01-13 (×11): 100 mg via ORAL
  Filled 2023-01-09 (×12): qty 2

## 2023-01-09 NOTE — Assessment & Plan Note (Signed)
Patient has separate cellulitis extending from the left inguinal all the way to just below the knee on the medial aspect of the thigh.  This may have been precipitated by skin disruption in the intertriginous area.  I do not think this is a purulent cellulitis.  Blood cultures have been drawn and patient has received clindamycin from the ER attending I will continue with the Unasyn.  Although patient does not meet sepsis criteria per se, her lactic acid is noted to be elevated, and she has received 2 L of NS bolus in the ER.  Patient does not appear toxic at this time.  I will maintain the patient on telemetry tonight with anticipated resolution of lactic acidosis -it is already downtrending from 3.8 to 2.8.

## 2023-01-09 NOTE — Consult Note (Signed)
Reason for Consult:left back wound Referring Provider: Andee Poles is an 89 y.o. female.  HPI: 89 yo female with history of stroke who is bedbound and unable to move her left side presents with worsening wound over her left buttock and back. It has been there for a few weeks but has been getting worse and now is draining a thin fluid.  Past Medical History:  Diagnosis Date   Allergic rhinitis    Arthritis    Carpal tunnel syndrome    Deep venous thrombosis (HCC)    bilateral legs   Degenerative arthritis    right knee   Hypertension    Lumbar degenerative disc disease    Lump or mass in breast    Paresthesia    Syncope 05/24/2013   Vitamin D deficiency     Past Surgical History:  Procedure Laterality Date   ABDOMINAL HYSTERECTOMY     CARPAL TUNNEL RELEASE Right    CATARACT EXTRACTION Bilateral    HEMORROIDECTOMY     IVC Filter     TOTAL HIP ARTHROPLASTY Right    ULNAR NERVE TRANSPOSITION Right     Family History  Problem Relation Age of Onset   Diabetes Mother    Kidney failure Father    Cancer - Lung Brother    Cancer - Prostate Brother    Kidney failure Son     Social History:  reports that she has been smoking cigarettes. She has a 300 pack-year smoking history. She has never been exposed to tobacco smoke. She has never used smokeless tobacco. She reports current alcohol use. She reports that she does not use drugs.  Allergies:  Allergies  Allergen Reactions   Codeine Other (See Comments)    Passed out   Penicillins Other (See Comments)    Passed out Has patient had a PCN reaction causing immediate rash, facial/tongue/throat swelling, SOB or lightheadedness with hypotension: Yes Has patient had a PCN reaction causing severe rash involving mucus membranes or skin necrosis: Unk Has patient had a PCN reaction that required hospitalization: No Has patient had a PCN reaction occurring within the last 10 years: No If all of the above answers  are "NO", then may proceed with Cephalosporin use.     Medications: I have reviewed the patient's current medications.  Results for orders placed or performed during the hospital encounter of 01/09/23 (from the past 48 hour(s))  Basic metabolic panel     Status: Abnormal   Collection Time: 01/09/23  4:23 PM  Result Value Ref Range   Sodium 141 135 - 145 mmol/L   Potassium 3.6 3.5 - 5.1 mmol/L   Chloride 109 98 - 111 mmol/L   CO2 21 (L) 22 - 32 mmol/L   Glucose, Bld 141 (H) 70 - 99 mg/dL    Comment: Glucose reference range applies only to samples taken after fasting for at least 8 hours.   BUN 11 8 - 23 mg/dL   Creatinine, Ser 1.61 0.44 - 1.00 mg/dL   Calcium 7.8 (L) 8.9 - 10.3 mg/dL   GFR, Estimated >09 >60 mL/min    Comment: (NOTE) Calculated using the CKD-EPI Creatinine Equation (2021)    Anion gap 11 5 - 15    Comment: Performed at [facility], [address], [place], [place] [address]  CBC with Differential     Status: Abnormal   Collection Time: 01/09/23  4:23 PM  Result Value Ref Range   WBC 14.8 (H)  4.0 - 10.5 K/uL   RBC 4.46 3.87 - 5.11 MIL/uL   Hemoglobin 12.7 12.0 - 15.0 g/dL   HCT 09.3 23.5 - 57.3 %   MCV 92.6 80.0 - 100.0 fL   MCH 28.5 26.0 - 34.0 pg   MCHC 30.8 30.0 - 36.0 g/dL   RDW 22.0 (H) 25.4 - 27.0 %   Platelets 195 150 - 400 K/uL   nRBC 0.0 0.0 - 0.2 %   Neutrophils Relative % 84 %   Neutro Abs 12.5 (H) 1.7 - 7.7 K/uL   Lymphocytes Relative 10 %   Lymphs Abs 1.4 0.7 - 4.0 K/uL   Monocytes Relative 5 %   Monocytes Absolute 0.7 0.1 - 1.0 K/uL   Eosinophils Relative 0 %   Eosinophils Absolute 0.0 0.0 - 0.5 K/uL   Basophils Relative 0 %   Basophils Absolute 0.1 0.0 - 0.1 K/uL   Immature Granulocytes 1 %   Abs Immature Granulocytes 0.07 0.00 - 0.07 K/uL    Comment: Performed at [facility], [address] [place], [place] [address]  I-Stat CG4 Lactic Acid     Status: Abnormal   Collection Time: 01/09/23  4:30  PM  Result Value Ref Range   Lactic Acid, Venous 3.8 (HH) 0.5 - 1.9 mmol/L   Comment NOTIFIED PHYSICIAN     No results found.  Review of Systems  Constitutional:  Negative for weight loss.  Cardiovascular:  Negative for chest pain.  Gastrointestinal:  Negative for abdominal pain, nausea and vomiting.  Neurological:  Positive for focal weakness.    PE Blood pressure (!) 151/61, pulse 86, temperature 98.2 F (36.8 C), resp. rate 17, SpO2 96%. Constitutional: NAD; conversant; no deformities Eyes: Moist conjunctiva; no lid lag; anicteric; PERRL Neck: Trachea midline; no thyromegaly Lungs: Normal respiratory effort; no tactile fremitus CV: RRR; no palpable thrills; no pitting edema GI: Abd soft, NT; no palpable hepatosplenomegaly MSK: bed bound; no clubbing/cyanosis Psychiatric: Appropriate affect; alert and oriented x3 Lymphatic: No palpable cervical or axillary lymphadenopathy Skin: No major subcutaneous nodules. Warm and dry   Assessment/Plan: 89 yo female with concern for pressure wound vs soft tissue infection, leukocytosis noted -follow up CT scan results -discussed possible need for surgery with patient and daughter.  I reviewed last 24 h vitals and pain scores, last 48 h intake and output, last 24 h labs and trends, and last 24 h imaging results.  This care required high  level of medical decision making.   De Blanch Harrol Novello 01/09/2023, 5:48 PM   CT scan results reviewed. Edema and fluid throughout the left subcutaneous tissue, no air or tracking. -more likely cellulitis on top of pressure wound -may need unroofing and debriding for pressure wound issue but does not appear to be emergent need  01/09/2023 18:02

## 2023-01-09 NOTE — ED Triage Notes (Addendum)
BIB [facility], c/o left hip skin infection that has traveled into lower left flank and groin area. Daughter cares for her at home and stated it began with skin tears that were treated with topical ABT. Estimated time of treatment about 2 weeks. Clear discharge noted, tender to touch, no odor present, warm to touch. Pt paralyzed on left side, bed bound at home

## 2023-01-09 NOTE — H&P (Signed)
History and Physical    Patient: ARIATNA RISTER WGN:[id number] DOB: April 04, 1929 DOA: 01/09/2023 DOS: the patient was seen and examined on 01/09/2023 PCP: Fleet Contras, MD  Patient coming from: Home  Chief Complaint:  Chief Complaint  Patient presents with   Wound Infection   HPI: ALISE HARPS is a 89 y.o. female with medical history significant of stroke complicated by left-sided hemiplegia with bedbound status for at least 2 years.  Patient has intermittent visits by her home health aide and majority of the care for the patient is carried out by daughter.  Patient typically gets turned once a day.  For about 3 weeks, there has been noticed by patient's daughter of left posterior hip as well as thigh area having a discoloration with intermittent intermittent skin breakage.  There has also been some weeping of clear fluid from there.  Patient has been given emollients to apply there by the daughter.  There has been no vomiting diarrhea fevers or rigors.  Patient had a telehealth visit by primary care doctor done today who reviewed the patient's skin over video.  Also the daughter felt that the patient was more painful on the affected area of the left posterior thigh and the left hip area posteriorly.  Given the entirety of these 2 factors, patient was advised to come to the ER by the primary care provider.  Patient offers no complaints does not have any pain and generally feels well.  There has been no change in patient's dietary habits or other behaviors. Review of Systems: As mentioned in the history of present illness. All other systems reviewed and are negative. Past Medical History:  Diagnosis Date   Allergic rhinitis    Arthritis    Carpal tunnel syndrome    Deep venous thrombosis (HCC)    bilateral legs   Degenerative arthritis    right knee   Hypertension    Lumbar degenerative disc disease    Lump or mass in breast    Paresthesia    Syncope 05/24/2013   Vitamin D  deficiency    Past Surgical History:  Procedure Laterality Date   ABDOMINAL HYSTERECTOMY     CARPAL TUNNEL RELEASE Right    CATARACT EXTRACTION Bilateral    HEMORROIDECTOMY     IVC Filter     TOTAL HIP ARTHROPLASTY Right    ULNAR NERVE TRANSPOSITION Right    Social History:  reports that she has been smoking cigarettes. She has a 300 pack-year smoking history. She has never been exposed to tobacco smoke. She has never used smokeless tobacco. She reports current alcohol use. She reports that she does not use drugs.  Allergies  Allergen Reactions   Codeine Other (See Comments)    Passed out   Penicillins Other (See Comments)    Passed out Has patient had a PCN reaction causing immediate rash, facial/tongue/throat swelling, SOB or lightheadedness with hypotension: Yes Has patient had a PCN reaction causing severe rash involving mucus membranes or skin necrosis: Unk Has patient had a PCN reaction that required hospitalization: No Has patient had a PCN reaction occurring within the last 10 years: No If all of the above answers are "NO", then may proceed with Cephalosporin use.     Family History  Problem Relation Age of Onset   Diabetes Mother    Kidney failure Father    Cancer - Lung Brother    Cancer - Prostate Brother    Kidney failure Son     Prior to  Admission medications   Medication Sig Start Date End Date Taking? Authorizing Provider  albuterol (VENTOLIN HFA) 108 (90 Base) MCG/ACT inhaler Inhale 1-2 puffs into the lungs every 6 (six) hours as needed for wheezing or shortness of breath.    [provider]  apixaban (ELIQUIS) 5 MG TABS tablet Take 5 mg by mouth 2 (two) times daily.    [provider]  atorvastatin (LIPITOR) 20 MG tablet Take 1 tablet (20 mg total) by mouth daily. Patient taking differently: Take 20 mg by mouth at bedtime. 01/07/21   Suzan Garibaldi, PA-C  diclofenac sodium (VOLTAREN) 1 % GEL Apply 1 application topically 4 (four) times  daily as needed (knee and hand pain).    [provider]  escitalopram (LEXAPRO) 10 MG tablet Take 10 mg by mouth at bedtime. 05/25/21   [provider]  loperamide (IMODIUM) 2 MG capsule Take 1 capsule (2 mg total) by mouth as needed for diarrhea or loose stools. 01/24/22   Lanae Boast, MD  losartan (COZAAR) 25 MG tablet Take 25 mg by mouth daily. 11/04/21   [provider]  meclizine (ANTIVERT) 25 MG tablet Take 25 mg by mouth 3 (three) times daily as needed for dizziness. As needed for vertigo    [provider]  pregabalin (LYRICA) 100 MG capsule Take 100 mg by mouth 3 (three) times daily. 05/31/21   [provider]    Physical Exam: Vitals:   01/09/23 1502 01/09/23 1600 01/09/23 1841 01/09/23 1900  BP: (!) 151/61 (!) 152/68 (!) 136/48 (!) 126/54  Pulse: 86 86 79 80  Resp: 17 18  16   Temp: 98.2 F (36.8 C)   97.9 F (36.6 C)  SpO2: 96% 94% 100% 94%   General: Patient is alert and awake and gives a reasonably coherent account of her history.  But the majority of the history is from the daughter at the bedside Respiratory exam: Bilateral air entry vesicular Cardiovascular exam S1-S2 normal Abdomen all quadrants soft nontender, patient is obese. Neuromuscular exam: Patient definitely has complete left hemiplegia and some mild dysarthria.  And drooping of the left side of the face. Patient is in good spirits.  There is maceration of intertriginous areas below the pannus of the abdomen. There is blanching erythema of the left medial thigh, left anterior knee skin and it extends just below the left knee anteriorly.  The knee per se is not affected in terms of any tests swelling or limitation of flexion.  There is 1+ edema of the affected left lower extremity.  Patient does not have any strength there.  Right lower extremity appears normal.  Patient was turned with assistance and her posterior thigh/hip on the left was examined.  There is an oval-shaped  area of hyperpigmentation at the junction of the left gluteal and left posterior thigh that is approximately 2 feet x 1 feet.  With's interspersed area of skin breakdown.  I did not appreciate any marked spreading erythema or any fluctuation.  No tenderness was elicited in this area by author Data Reviewed:  Labs on Admission:  Results for orders placed or performed during the hospital encounter of 01/09/23 (from the past 24 hour(s))  Basic metabolic panel     Status: Abnormal   Collection Time: 01/09/23  4:23 PM  Result Value Ref Range   Sodium 141 135 - 145 mmol/L   Potassium 3.6 3.5 - 5.1 mmol/L   Chloride 109 98 - 111 mmol/L   CO2 21 (L)  22 - 32 mmol/L   Glucose, Bld 141 (H) 70 - 99 mg/dL   BUN 11 8 - 23 mg/dL   Creatinine, Ser 8.11 0.44 - 1.00 mg/dL   Calcium 7.8 (L) 8.9 - 10.3 mg/dL   GFR, Estimated >91 >47 mL/min   Anion gap 11 5 - 15  CBC with Differential     Status: Abnormal   Collection Time: 01/09/23  4:23 PM  Result Value Ref Range   WBC 14.8 (H) 4.0 - 10.5 K/uL   RBC 4.46 3.87 - 5.11 MIL/uL   Hemoglobin 12.7 12.0 - 15.0 g/dL   HCT 82.9 56.2 - 13.0 %   MCV 92.6 80.0 - 100.0 fL   MCH 28.5 26.0 - 34.0 pg   MCHC 30.8 30.0 - 36.0 g/dL   RDW 86.5 (H) 78.4 - 69.6 %   Platelets 195 150 - 400 K/uL   nRBC 0.0 0.0 - 0.2 %   Neutrophils Relative % 84 %   Neutro Abs 12.5 (H) 1.7 - 7.7 K/uL   Lymphocytes Relative 10 %   Lymphs Abs 1.4 0.7 - 4.0 K/uL   Monocytes Relative 5 %   Monocytes Absolute 0.7 0.1 - 1.0 K/uL   Eosinophils Relative 0 %   Eosinophils Absolute 0.0 0.0 - 0.5 K/uL   Basophils Relative 0 %   Basophils Absolute 0.1 0.0 - 0.1 K/uL   Immature Granulocytes 1 %   Abs Immature Granulocytes 0.07 0.00 - 0.07 K/uL  I-Stat CG4 Lactic Acid     Status: Abnormal   Collection Time: 01/09/23  4:30 PM  Result Value Ref Range   Lactic Acid, Venous 3.8 (HH) 0.5 - 1.9 mmol/L   Comment NOTIFIED PHYSICIAN   I-Stat CG4 Lactic Acid     Status: Abnormal   Collection Time:  01/09/23  9:13 PM  Result Value Ref Range   Lactic Acid, Venous 2.8 (HH) 0.5 - 1.9 mmol/L   Comment NOTIFIED PHYSICIAN    Basic Metabolic Panel: Recent Labs  Lab 01/09/23 1623  NA 141  K 3.6  CL 109  CO2 21*  GLUCOSE 141*  BUN 11  CREATININE 0.86  CALCIUM 7.8*   Liver Function Tests: No results for input(s): "AST", "ALT", "ALKPHOS", "BILITOT", "PROT", "ALBUMIN" in the last 168 hours. No results for input(s): "LIPASE", "AMYLASE" in the last 168 hours. No results for input(s): "AMMONIA" in the last 168 hours. CBC: Recent Labs  Lab 01/09/23 1623  WBC 14.8*  NEUTROABS 12.5*  HGB 12.7  HCT 41.3  MCV 92.6  PLT 195   Cardiac Enzymes: No results for input(s): "CKTOTAL", "CKMB", "CKMBINDEX", "TROPONINIHS" in the last 168 hours.  BNP (last 3 results) No results for input(s): "PROBNP" in the last 8760 hours. CBG: No results for input(s): "GLUCAP" in the last 168 hours.  Radiological Exams on Admission:  CT ABDOMEN PELVIS W CONTRAST  Result Date: 01/09/2023 CLINICAL DATA:  Psoas abscess, left hip infection, left flank rash EXAM: CT ABDOMEN AND PELVIS WITH CONTRAST TECHNIQUE: Multidetector CT imaging of the abdomen and pelvis was performed using the standard protocol following bolus administration of intravenous contrast. RADIATION DOSE REDUCTION: This exam was performed according to the departmental dose-optimization program which includes automated exposure control, adjustment of the mA and/or kV according to patient size and/or use of iterative reconstruction technique. CONTRAST:  OMNIPAQUE IOHEXOL 300 MG/ML  SOLN COMPARISON:  01/21/2022 FINDINGS: Lower chest: No acute abnormality. Hepatobiliary: 13 cm lobulated mass essentially replacing the lateral segment the left hepatic lobe  demonstrates nodular peripheral enhancement with gradual fill-in on delayed images and is in keeping with a benign cavernous hemangioma. The liver is otherwise unremarkable. No intra or extrahepatic  biliary ductal dilation. Cholelithiasis noted without pericholecystic inflammatory change. Pancreas: Unremarkable Spleen: Unremarkable Adrenals/Urinary Tract: The adrenal glands are unremarkable. The kidneys are normal in size and position. Nonobstructing renal calculi are noted within the upper poles of the kidneys bilaterally measuring up to 11 mm. No ureteral calculi. No hydronephrosis. Normal renal cortical enhancement. The bladder is unremarkable. Stomach/Bowel: Severe pancolonic diverticulosis without superimposed acute inflammatory change. Large volume stool within the rectal vault. Stomach, small bowel, and large bowel are otherwise unremarkable. No obstruction. Appendix normal. No free intraperitoneal gas or fluid. Vascular/Lymphatic: Inferior vena cava filter in expected position within the infrarenal cava. Mild aortoiliac atherosclerotic calcification. No aortic aneurysm. No pathologic adenopathy within the abdomen and pelvis. Reproductive: Status post hysterectomy. No adnexal masses. Other: There is asymmetric subcutaneous edema noted within the left flank and visualized lateral left hip which may represent asymmetric, possibly positional, edema or a superimposed inflammatory change as can be seen with cellulitis. No loculated subcutaneous fluid collection is identified. No retroperitoneal fluid collection or hematoma identified. The iliopsoas musculature is unremarkable bilaterally. Musculoskeletal: Right total hip arthroplasty has been performed. Degenerative changes are seen within the lumbar spine and left hip. No acute bone abnormality. IMPRESSION: 1. Asymmetric subcutaneous edema within the left flank and visualized lateral left hip which may represent asymmetric, possibly positional, edema or a superimposed inflammatory change as can be seen with cellulitis. No loculated subcutaneous fluid collection is identified. No retroperitoneal fluid collection or hematoma identified. 2. Severe pancolonic  diverticulosis without superimposed acute inflammatory change. 3. Cholelithiasis. 4. Bilateral nonobstructing renal calculi. 5. 13 cm benign cavernous hemangioma within the left hepatic lobe. Aortic Atherosclerosis (ICD10-I70.0). Electronically Signed   By: Helyn Numbers M.D.   On: 01/09/2023 20:00       Assessment and Plan: * Cellulitis Patient has separate cellulitis extending from the left inguinal all the way to just below the knee on the medial aspect of the thigh.  This may have been precipitated by skin disruption in the intertriginous area.  I do not think this is a purulent cellulitis.  Blood cultures have been drawn and patient has received clindamycin from the ER attending I will continue with the Unasyn.  Although patient does not meet sepsis criteria per se, her lactic acid is noted to be elevated, and she has received 2 L of NS bolus in the ER.  Patient does not appear toxic at this time.  I will maintain the patient on telemetry tonight with anticipated resolution of lactic acidosis -it is already downtrending from 3.8 to 2.8.  Pressure ulcer Patient has an extensive pressure injury in an oval shape approximately 2 feet x 1 feet underlying her left pelvis and left thigh.  This seems to be a and a mixture of stage I and stage II injury.  Turning position patient request wound care.  CVA (cerebral vascular accident) (HCC) Chornic. Left hemiplegia. Patient takes eliquis for this indication based on record review as well as discusion with family.   Please reconcile other home meds after pharmacy review of meds. Hold Anti HTN agents tonight. Once lactic acidosis resolved, can restart.    Advance Care Planning:   Code Status: Full Code discussed with patient and family. Daughter is POA for health care purposes.  Consults: surgery was consulted by ER attending - thank you. See Dr.  Kissinger note. He may do debridement routinely as the wound/pressure injury evolves.  Family  Communication: daughter at bedside. All questions answered.  Severity of Illness: The appropriate patient status for this patient is INPATIENT. Inpatient status is judged to be reasonable and necessary in order to provide the required intensity of service to ensure the patient's safety. The patient's presenting symptoms, physical exam findings, and initial radiographic and laboratory data in the context of their chronic comorbidities is felt to place them at high risk for further clinical deterioration. Furthermore, it is not anticipated that the patient will be medically stable for discharge from the hospital within 2 midnights of admission.   * I certify that at the point of admission it is my clinical judgment that the patient will require inpatient hospital care spanning beyond 2 midnights from the point of admission due to high intensity of service, high risk for further deterioration and high frequency of surveillance required.*  Author: Nolberto Hanlon, MD 01/09/2023 9:27 PM  For on call review [email or web address].

## 2023-01-09 NOTE — ED Provider Notes (Signed)
[place] EMERGENCY DEPARTMENT AT [facility] Provider Note   CSN: [id number] Arrival date & time: 01/09/23  1448     History  Chief Complaint  Patient presents with   Wound Infection    [name] is a 89 y.o. female presented to the ED with concern for rapidly spreading rash on her left flank.  Patient has hemiplegia from a stroke and is bedbound.  She has a history of obesity.  Her daughter helps take care of her at home with wound care.  They are concerned that the patient has a rapidly spreading rash on her left flank involving the posterior left buttocks.  Her doctor prescribed a zinc topical powder which she had been applying but the rash feels worse and the patient is "soaking through sheets" with discharge from the wound.  HPI     Home Medications Prior to Admission medications   Medication Sig Start Date End Date Taking? Authorizing Provider  albuterol (VENTOLIN HFA) 108 (90 Base) MCG/ACT inhaler Inhale 1-2 puffs into the lungs every 6 (six) hours as needed for wheezing or shortness of breath.    [provider]  apixaban (ELIQUIS) 5 MG TABS tablet Take 5 mg by mouth 2 (two) times daily.    [provider]  atorvastatin (LIPITOR) 20 MG tablet Take 1 tablet (20 mg total) by mouth daily. Patient taking differently: Take 20 mg by mouth at bedtime. 01/07/21   Suzan Garibaldi, PA-C  diclofenac sodium (VOLTAREN) 1 % GEL Apply 1 application topically 4 (four) times daily as needed (knee and hand pain).    [provider]  escitalopram (LEXAPRO) 10 MG tablet Take 10 mg by mouth at bedtime. 05/25/21   [provider]  loperamide (IMODIUM) 2 MG capsule Take 1 capsule (2 mg total) by mouth as needed for diarrhea or loose stools. 01/24/22   Lanae Boast, MD  losartan (COZAAR) 25 MG tablet Take 25 mg by mouth daily. 11/04/21   [provider]  meclizine (ANTIVERT) 25 MG tablet Take 25 mg by mouth 3 (three) times daily as needed  for dizziness. As needed for vertigo    [provider]  pregabalin (LYRICA) 100 MG capsule Take 100 mg by mouth 3 (three) times daily. 05/31/21   [provider]      Allergies    Codeine and Penicillins    Review of Systems   Review of Systems  Physical Exam Updated Vital Signs BP (!) 151/61   Pulse 86   Temp 98.2 F (36.8 C)   Resp 17   SpO2 96%  Physical Exam Constitutional:      General: She is not in acute distress. HENT:     Head: Normocephalic and atraumatic.  Eyes:     Conjunctiva/sclera: Conjunctivae normal.     Pupils: Pupils are equal, round, and reactive to light.  Cardiovascular:     Rate and Rhythm: Normal rate and regular rhythm.  Pulmonary:     Effort: Pulmonary effort is normal. No respiratory distress.  Abdominal:     General: There is no distension.     Tenderness: There is no abdominal tenderness.  Musculoskeletal:     Comments: Large lesion that appears erythematous, edematous with some skin breakdown involving the left posterior buttock Erythema and irritation of the inguinal fold and beneath the pannus  Skin:    General: Skin is warm and dry.  Neurological:     General: No focal deficit present.  Mental Status: She is alert and oriented to person, place, and time. Mental status is at baseline.  Psychiatric:        Mood and Affect: Mood normal.        Behavior: Behavior normal.     ED Results / Procedures / Treatments   Labs (all labs ordered are listed, but only abnormal results are displayed) Labs Reviewed  BASIC METABOLIC PANEL - Abnormal; Notable for the following components:      Result Value   CO2 21 (*)    Glucose, Bld 141 (*)    Calcium 7.8 (*)    All other components within normal limits  CBC WITH DIFFERENTIAL/PLATELET - Abnormal; Notable for the following components:   WBC 14.8 (*)    RDW 17.2 (*)    Neutro Abs 12.5 (*)    All other components within normal limits  I-STAT CG4 LACTIC ACID, ED - Abnormal;  Notable for the following components:   Lactic Acid, Venous 3.8 (*)    All other components within normal limits  CULTURE, BLOOD (ROUTINE X 2)  CULTURE, BLOOD (ROUTINE X 2)    EKG None  Radiology No results found.  Procedures Procedures    Medications Ordered in ED Medications  clindamycin (CLEOCIN) IVPB 900 mg (has no administration in time range)  sodium chloride 0.9 % bolus 2,000 mL (has no administration in time range)  iohexol (OMNIPAQUE) 300 MG/ML solution 100 mL (100 mLs Intravenous Contrast Given 01/09/23 1749)    ED Course/ Medical Decision Making/ A&P Clinical Course as of 01/09/23 1752  Mon Jan 09, 2023  1702 General surgeon to see patient regarding possibility of Brandt Loosen [MT]    Clinical Course User Index [MT] Terald Sleeper, MD                                 Medical Decision Making Amount and/or Complexity of Data Reviewed Labs: ordered. Radiology: ordered.  Risk Prescription drug management.   Patient is presented to ED with a buttock rash and also evidence of likely intertrigo.  Differential would include worsening yeast infection versus edema versus cellulitis or skin or soft tissue infection versus other.  She does have a significant amount of watery discharge from this wound.  I have ordered labs, white blood cell count and a lactate.  Labs reviewed with elevated lactate level, elevated white blood cell count.  At this point I have ordered blood cultures, IV clindamycin.  I placed a stat consult to general surgery (Dr Sheliah Hatch) for the possibility of necrotizing fasciitis.  I have ordered CT imaging with contrast to evaluate for underlying abscess or infection.  I updated both the patient and her daughter as I am anticipating medical admission for this issue.  2 L of IV fluid ordered with elevated lactate level.  The patient's blood pressure otherwise remained stable, she is mentating well, and she does not appear to be in any significant  distress resting in the bed in the room.  The patient is signed out to Dr Marlene Bast EDP at 5 pm pending follow up on CT imaging and general surgeon consult.          Final Clinical Impression(s) / ED Diagnoses Final diagnoses:  Skin infection    Rx / DC Orders ED Discharge Orders     None         Knight Oelkers, Kermit Balo, MD 01/09/23 [phone]

## 2023-01-09 NOTE — Assessment & Plan Note (Signed)
Patient has an extensive pressure injury in an oval shape approximately 2 feet x 1 feet underlying her left pelvis and left thigh.  This seems to be a and a mixture of stage I and stage II injury.  Turning position patient request wound care.

## 2023-01-09 NOTE — ED Provider Notes (Signed)
Possible nec fasc.  Bed bound.  Wound on hip spreading.  Purulent.  Lactic acid up, leukocytosis.  Gen surg to see.  Getting CT, clinda.  Will need admit.

## 2023-01-09 NOTE — ED Notes (Signed)
Physician was notified of critical value on lactic

## 2023-01-09 NOTE — Assessment & Plan Note (Addendum)
Chornic. Left hemiplegia. Patient takes eliquis for this indication based on record review as well as discusion with family.

## 2023-01-09 NOTE — ED Notes (Signed)
ED TO INPATIENT HANDOFF REPORT  ED Nurse Name and Phone #:  Gypsy Balsam [phone]  S Name/Age/Gender [name] 89 y.o. female Room/Bed: WA16/WA16  Code Status   Code Status: Full Code  Home/SNF/Other Home Patient oriented to: self, place, time, and situation Is this baseline? Yes   Triage Complete: Triage complete  Chief Complaint Cellulitis [L03.90]  Triage Note BIB [facility], c/o left hip skin infection that has traveled into lower left flank and groin area. Daughter cares for her at home and stated it began with skin tears that were treated with topical ABT. Estimated time of treatment about 2 weeks. Clear discharge noted, tender to touch, no odor present, warm to touch. Pt paralyzed on left side, bed bound at home   Allergies Allergies  Allergen Reactions   Codeine Other (See Comments)    Passed out   Penicillins Other (See Comments)    Passed out Has patient had a PCN reaction causing immediate rash, facial/tongue/throat swelling, SOB or lightheadedness with hypotension: Yes Has patient had a PCN reaction causing severe rash involving mucus membranes or skin necrosis: Unk Has patient had a PCN reaction that required hospitalization: No Has patient had a PCN reaction occurring within the last 10 years: No If all of the above answers are "NO", then may proceed with Cephalosporin use.     Level of Care/Admitting Diagnosis ED Disposition     ED Disposition  Admit   Condition  --   Comment  Hospital Area: [facility] [[id number]]  Level of Care: Med-Surg [16]  May admit patient to [facility] or [facility] if equivalent level of care is available:: Yes  Covid Evaluation: Asymptomatic - no recent exposure (last 10 days) testing not required  Diagnosis: Cellulitis [[id number]]  Admitting Physician: Nolberto Hanlon [[id number]]  Attending Physician: Nolberto Hanlon [[id number]]  Certification:: I certify this patient will need inpatient services for at least 2  midnights  Expected Medical Readiness: 01/11/2023          B Medical/Surgery History Past Medical History:  Diagnosis Date   Allergic rhinitis    Arthritis    Carpal tunnel syndrome    Deep venous thrombosis (HCC)    bilateral legs   Degenerative arthritis    right knee   Hypertension    Lumbar degenerative disc disease    Lump or mass in breast    Paresthesia    Syncope 05/24/2013   Vitamin D deficiency    Past Surgical History:  Procedure Laterality Date   ABDOMINAL HYSTERECTOMY     CARPAL TUNNEL RELEASE Right    CATARACT EXTRACTION Bilateral    HEMORROIDECTOMY     IVC Filter     TOTAL HIP ARTHROPLASTY Right    ULNAR NERVE TRANSPOSITION Right      A IV Location/Drains/Wounds Patient Lines/Drains/Airways Status     Active Line/Drains/Airways     Name Placement date Placement time Site Days   Peripheral IV 01/09/23 22 G 1" Posterior;Right Forearm 01/09/23  1630  Forearm  less than 1   External Urinary Catheter 01/22/22  0300  --  352            Intake/Output Last 24 hours  Intake/Output Summary (Last 24 hours) at 01/09/2023 2144 Last data filed at 01/09/2023 2043 Gross per 24 hour  Intake 50 ml  Output --  Net 50 ml    Labs/Imaging Results for orders placed or performed during the hospital encounter of 01/09/23 (from the past 48 hour(s))  Basic metabolic panel     Status: Abnormal   Collection Time: 01/09/23  4:23 PM  Result Value Ref Range   Sodium 141 135 - 145 mmol/L   Potassium 3.6 3.5 - 5.1 mmol/L   Chloride 109 98 - 111 mmol/L   CO2 21 (L) 22 - 32 mmol/L   Glucose, Bld 141 (H) 70 - 99 mg/dL    Comment: Glucose reference range applies only to samples taken after fasting for at least 8 hours.   BUN 11 8 - 23 mg/dL   Creatinine, Ser 0.98 0.44 - 1.00 mg/dL   Calcium 7.8 (L) 8.9 - 10.3 mg/dL   GFR, Estimated >11 >91 mL/min    Comment: (NOTE) Calculated using the CKD-EPI Creatinine Equation (2021)    Anion gap 11 5 - 15    Comment:  Performed at [facility], [address], [place], [place] [address]  CBC with Differential     Status: Abnormal   Collection Time: 01/09/23  4:23 PM  Result Value Ref Range   WBC 14.8 (H) 4.0 - 10.5 K/uL   RBC 4.46 3.87 - 5.11 MIL/uL   Hemoglobin 12.7 12.0 - 15.0 g/dL   HCT 56.2 13.0 - 86.5 %   MCV 92.6 80.0 - 100.0 fL   MCH 28.5 26.0 - 34.0 pg   MCHC 30.8 30.0 - 36.0 g/dL   RDW 78.4 (H) 69.6 - 29.5 %   Platelets 195 150 - 400 K/uL   nRBC 0.0 0.0 - 0.2 %   Neutrophils Relative % 84 %   Neutro Abs 12.5 (H) 1.7 - 7.7 K/uL   Lymphocytes Relative 10 %   Lymphs Abs 1.4 0.7 - 4.0 K/uL   Monocytes Relative 5 %   Monocytes Absolute 0.7 0.1 - 1.0 K/uL   Eosinophils Relative 0 %   Eosinophils Absolute 0.0 0.0 - 0.5 K/uL   Basophils Relative 0 %   Basophils Absolute 0.1 0.0 - 0.1 K/uL   Immature Granulocytes 1 %   Abs Immature Granulocytes 0.07 0.00 - 0.07 K/uL    Comment: Performed at [facility], [address] [place], [place] [address]  I-Stat CG4 Lactic Acid     Status: Abnormal   Collection Time: 01/09/23  4:30 PM  Result Value Ref Range   Lactic Acid, Venous 3.8 (HH) 0.5 - 1.9 mmol/L   Comment NOTIFIED PHYSICIAN   I-Stat CG4 Lactic Acid     Status: Abnormal   Collection Time: 01/09/23  9:13 PM  Result Value Ref Range   Lactic Acid, Venous 2.8 (HH) 0.5 - 1.9 mmol/L   Comment NOTIFIED PHYSICIAN    CT ABDOMEN PELVIS W CONTRAST  Result Date: 01/09/2023 CLINICAL DATA:  Psoas abscess, left hip infection, left flank rash EXAM: CT ABDOMEN AND PELVIS WITH CONTRAST TECHNIQUE: Multidetector CT imaging of the abdomen and pelvis was performed using the standard protocol following bolus administration of intravenous contrast. RADIATION DOSE REDUCTION: This exam was performed according to the departmental dose-optimization program which includes automated exposure control, adjustment of the mA and/or kV according to patient size and/or use of iterative  reconstruction technique. CONTRAST:  OMNIPAQUE IOHEXOL 300 MG/ML  SOLN COMPARISON:  01/21/2022 FINDINGS: Lower chest: No acute abnormality. Hepatobiliary: 13 cm lobulated mass essentially replacing the lateral segment the left hepatic lobe demonstrates nodular peripheral enhancement with gradual fill-in on delayed images and is in keeping with a benign cavernous hemangioma. The liver is otherwise unremarkable. No intra or extrahepatic biliary ductal  dilation. Cholelithiasis noted without pericholecystic inflammatory change. Pancreas: Unremarkable Spleen: Unremarkable Adrenals/Urinary Tract: The adrenal glands are unremarkable. The kidneys are normal in size and position. Nonobstructing renal calculi are noted within the upper poles of the kidneys bilaterally measuring up to 11 mm. No ureteral calculi. No hydronephrosis. Normal renal cortical enhancement. The bladder is unremarkable. Stomach/Bowel: Severe pancolonic diverticulosis without superimposed acute inflammatory change. Large volume stool within the rectal vault. Stomach, small bowel, and large bowel are otherwise unremarkable. No obstruction. Appendix normal. No free intraperitoneal gas or fluid. Vascular/Lymphatic: Inferior vena cava filter in expected position within the infrarenal cava. Mild aortoiliac atherosclerotic calcification. No aortic aneurysm. No pathologic adenopathy within the abdomen and pelvis. Reproductive: Status post hysterectomy. No adnexal masses. Other: There is asymmetric subcutaneous edema noted within the left flank and visualized lateral left hip which may represent asymmetric, possibly positional, edema or a superimposed inflammatory change as can be seen with cellulitis. No loculated subcutaneous fluid collection is identified. No retroperitoneal fluid collection or hematoma identified. The iliopsoas musculature is unremarkable bilaterally. Musculoskeletal: Right total hip arthroplasty has been performed. Degenerative  changes are seen within the lumbar spine and left hip. No acute bone abnormality. IMPRESSION: 1. Asymmetric subcutaneous edema within the left flank and visualized lateral left hip which may represent asymmetric, possibly positional, edema or a superimposed inflammatory change as can be seen with cellulitis. No loculated subcutaneous fluid collection is identified. No retroperitoneal fluid collection or hematoma identified. 2. Severe pancolonic diverticulosis without superimposed acute inflammatory change. 3. Cholelithiasis. 4. Bilateral nonobstructing renal calculi. 5. 13 cm benign cavernous hemangioma within the left hepatic lobe. Aortic Atherosclerosis (ICD10-I70.0). Electronically Signed   By: Helyn Numbers M.D.   On: 01/09/2023 20:00    Pending Labs Unresulted Labs (From admission, onward)     Start     Ordered   01/16/23 0500  Creatinine, serum  (enoxaparin (LOVENOX)    CrCl >/= 30 ml/min)  Weekly,   R     Comments: while on enoxaparin therapy    01/09/23 2126   01/10/23 0500  APTT  Tomorrow morning,   R        01/09/23 2126   01/10/23 0500  Protime-INR  Tomorrow morning,   R        01/09/23 2126   01/10/23 0500  Basic metabolic panel  Tomorrow morning,   R        01/09/23 2126   01/10/23 0500  CBC  Tomorrow morning,   R        01/09/23 2126   01/09/23 2126  CBC  (enoxaparin (LOVENOX)    CrCl >/= 30 ml/min)  Once,   R       Comments: Baseline for enoxaparin therapy IF NOT ALREADY DRAWN.  Notify MD if PLT < 100 K.    01/09/23 2126   01/09/23 2126  Creatinine, serum  (enoxaparin (LOVENOX)    CrCl >/= 30 ml/min)  Once,   R       Comments: Baseline for enoxaparin therapy IF NOT ALREADY DRAWN.    01/09/23 2126   01/09/23 1703  Blood culture (routine x 2)  BLOOD CULTURE X 2,   R (with STAT occurrences)      01/09/23 1703            Vitals/Pain Today's Vitals   01/09/23 1502 01/09/23 1600 01/09/23 1841 01/09/23 1900  BP: (!) 151/61 (!) 152/68 (!) 136/48 (!) 126/54  Pulse: 86 86  79 80  Resp: 17 18  16  Temp: 98.2 F (36.8 C)   97.9 F (36.6 C)  SpO2: 96% 94% 100% 94%  PainSc:        Isolation Precautions No active isolations  Medications Medications  acetaminophen (TYLENOL) tablet 650 mg (has no administration in time range)    Or  acetaminophen (TYLENOL) suppository 650 mg (has no administration in time range)  polyethylene glycol (MIRALAX / GLYCOLAX) packet 17 g (has no administration in time range)  sodium chloride flush (NS) 0.9 % injection 3 mL (has no administration in time range)  nystatin (MYCOSTATIN/NYSTOP) topical powder (has no administration in time range)  lactated ringers infusion (has no administration in time range)  ceFAZolin (ANCEF) IVPB 2g/100 mL premix (has no administration in time range)  apixaban (ELIQUIS) tablet 5 mg (has no administration in time range)  clindamycin (CLEOCIN) IVPB 900 mg (0 mg Intravenous Stopped 01/09/23 2043)  sodium chloride 0.9 % bolus 2,000 mL (2,000 mLs Intravenous New Bag/Given 01/09/23 1816)  iohexol (OMNIPAQUE) 300 MG/ML solution 100 mL (100 mLs Intravenous Contrast Given 01/09/23 1749)    Mobility non-ambulatory/ bedbound     R Report given to:

## 2023-01-10 ENCOUNTER — Other Ambulatory Visit: Payer: Self-pay

## 2023-01-10 ENCOUNTER — Inpatient Hospital Stay (HOSPITAL_COMMUNITY): Payer: 59

## 2023-01-10 DIAGNOSIS — L03317 Cellulitis of buttock: Secondary | ICD-10-CM | POA: Diagnosis not present

## 2023-01-10 DIAGNOSIS — M7989 Other specified soft tissue disorders: Secondary | ICD-10-CM

## 2023-01-10 LAB — CBC
HCT: 35.2 % — ABNORMAL LOW (ref 36.0–46.0)
Hemoglobin: 11.1 g/dL — ABNORMAL LOW (ref 12.0–15.0)
MCH: 29 pg (ref 26.0–34.0)
MCHC: 31.5 g/dL (ref 30.0–36.0)
MCV: 91.9 fL (ref 80.0–100.0)
Platelets: 194 10*3/uL (ref 150–400)
RBC: 3.83 MIL/uL — ABNORMAL LOW (ref 3.87–5.11)
RDW: 17.1 % — ABNORMAL HIGH (ref 11.5–15.5)
WBC: 9.8 10*3/uL (ref 4.0–10.5)
nRBC: 0 % (ref 0.0–0.2)

## 2023-01-10 LAB — BASIC METABOLIC PANEL
Anion gap: 5 (ref 5–15)
BUN: 11 mg/dL (ref 8–23)
CO2: 23 mmol/L (ref 22–32)
Calcium: 7.8 mg/dL — ABNORMAL LOW (ref 8.9–10.3)
Chloride: 109 mmol/L (ref 98–111)
Creatinine, Ser: 0.81 mg/dL (ref 0.44–1.00)
GFR, Estimated: >60 mL/min (ref >60)
Glucose, Bld: 135 mg/dL — ABNORMAL HIGH (ref 70–99)
Potassium: 3.8 mmol/L (ref 3.5–5.1)
Sodium: 137 mmol/L (ref 135–145)

## 2023-01-10 LAB — APTT: aPTT: 34 seconds (ref 24–36)

## 2023-01-10 LAB — LACTIC ACID, PLASMA
Lactic Acid, Venous: 3.3 mmol/L (ref 0.5–1.9)
Lactic Acid, Venous: 3.2 mmol/L (ref 0.5–1.9)

## 2023-01-10 LAB — PROTIME-INR
INR: 1.8 — ABNORMAL HIGH (ref 0.8–1.2)
Prothrombin Time: 20.9 seconds — ABNORMAL HIGH (ref 11.4–15.2)

## 2023-01-10 MED ORDER — HYDROXYZINE HCL 10 MG PO TABS
10.0000 mg | ORAL_TABLET | Freq: Four times a day (QID) | ORAL | Status: DC | PRN
  Administered 2023-01-10 – 2023-01-12 (×4): 10 mg via ORAL
  Filled 2023-01-10 (×4): qty 1

## 2023-01-10 MED ORDER — CEFAZOLIN SODIUM-DEXTROSE 2-4 GM/100ML-% IV SOLN
2.0000 g | Freq: Three times a day (TID) | INTRAVENOUS | Status: DC
  Administered 2023-01-10 – 2023-01-12 (×5): 2 g via INTRAVENOUS
  Filled 2023-01-10 (×5): qty 100

## 2023-01-10 MED ORDER — ATORVASTATIN CALCIUM 10 MG PO TABS
20.0000 mg | ORAL_TABLET | Freq: Every day | ORAL | Status: DC
  Administered 2023-01-10 – 2023-01-12 (×3): 20 mg via ORAL
  Filled 2023-01-10 (×4): qty 2

## 2023-01-10 MED ORDER — ALBUTEROL SULFATE (2.5 MG/3ML) 0.083% IN NEBU
2.5000 mg | INHALATION_SOLUTION | Freq: Four times a day (QID) | RESPIRATORY_TRACT | Status: DC | PRN
  Administered 2023-01-11: 2.5 mg via RESPIRATORY_TRACT
  Filled 2023-01-10: qty 3

## 2023-01-10 MED ORDER — RISAQUAD PO CAPS
1.0000 | ORAL_CAPSULE | Freq: Every day | ORAL | Status: DC
  Administered 2023-01-10 – 2023-01-13 (×4): 1 via ORAL
  Filled 2023-01-10 (×4): qty 1

## 2023-01-10 MED ORDER — SIMETHICONE 80 MG PO CHEW
80.0000 mg | CHEWABLE_TABLET | Freq: Four times a day (QID) | ORAL | Status: DC | PRN
  Filled 2023-01-10: qty 1

## 2023-01-10 MED ORDER — LACTATED RINGERS IV SOLN: INTRAVENOUS | Status: AC

## 2023-01-10 MED ORDER — MEDIHONEY WOUND/BURN DRESSING EX PSTE
1.0000 | PASTE | Freq: Every day | CUTANEOUS | Status: DC
  Administered 2023-01-10 – 2023-01-13 (×4): 1 via TOPICAL
  Filled 2023-01-10: qty 44

## 2023-01-10 MED ORDER — ENSURE ENLIVE PO LIQD
237.0000 mL | Freq: Two times a day (BID) | ORAL | Status: DC
  Administered 2023-01-10 – 2023-01-13 (×7): 237 mL via ORAL

## 2023-01-10 MED ORDER — ESCITALOPRAM OXALATE 10 MG PO TABS
10.0000 mg | ORAL_TABLET | Freq: Every evening | ORAL | Status: DC
  Administered 2023-01-10 – 2023-01-12 (×3): 10 mg via ORAL
  Filled 2023-01-10 (×3): qty 1

## 2023-01-10 NOTE — Plan of Care (Signed)
  Problem: Education: Goal: Knowledge of General Education information will improve Description: Including pain rating scale, medication(s)/side effects and non-pharmacologic comfort measures Outcome: Progressing   Problem: Health Behavior/Discharge Planning: Goal: Ability to manage health-related needs will improve Outcome: Progressing   Problem: Clinical Measurements: Goal: Will remain free from infection Outcome: Progressing Goal: Diagnostic test results will improve Outcome: Progressing Note: Elevated lactic acid levels. MD aware, pt started in IVF.    Problem: Nutrition: Goal: Adequate nutrition will be maintained Outcome: Progressing   Problem: Skin Integrity: Goal: Risk for impaired skin integrity will decrease Outcome: Progressing Note: Wound care has assessed pt and placed wound care orders. Pt also being tuned Q2 hours and linens changed as needed to keep skin clean and dry.

## 2023-01-10 NOTE — Progress Notes (Signed)
Progress Note     Subjective: Patient seen at bedside with daughter present. Daughter reports they recently changed home health agencies. She tries to turn her mother frequently but also works full time and has aides that come in when she is at work. She is not willing to consider SNF at all. [name] has been eating well but in the last few weeks complained of more pain to left buttock and side and she noted more drainage from the area. She had some superficial abrasions that they were treating with local wound care. We discussed possible debridement of the area and patient's daughter is not willing to pursue that at this time. She does want to proceed with other conservative measures and trying to offload pressure which I agree will ultimately be essential. She also had concerns that her left knee seemed warm and more edematous. The patient is followed by Dr. Doneen Poisson for orthopedics.   Objective: Vital signs in last 24 hours: Temp:  [97.7 F (36.5 C)-98.2 F (36.8 C)] 97.8 F (36.6 C) (09/24 0650) Pulse Rate:  [60-86] 60 (09/24 0650) Resp:  [16-18] 16 (09/24 0650) BP: (117-190)/(48-82) 117/61 (09/24 0650) SpO2:  [93 %-100 %] 100 % (09/24 0650) Weight:  [123.5 kg] 123.5 kg (09/24 1121) Last BM Date : 01/09/23  Intake/Output from previous day: 09/23 0701 - 09/24 0700 In: 1438.7 [I.V.:338.7; IV Piggyback:1100] Out: 350 [Urine:350] Intake/Output this shift: No intake/output data recorded.  PE: General: pleasant, WD, obese female who is laying in bed in NAD Heart: regular, rate, and rhythm.   Lungs:  Respiratory effort nonlabored Abd: soft, NT, ND MS: scabbing present to knees bilaterally, mild edema and erythema of left medial knee, no ttp of left knee Skin: darkened large area to left buttock and flank without fluctuance or crepitance, small abraded areas with healthy granulation, edematous tissues with serous weeping on left side  Neuro: left hemiplegia   Lab  Results:  Recent Labs    01/09/23 1623 01/10/23 0531  WBC 14.8* 9.8  HGB 12.7 11.1*  HCT 41.3 35.2*  PLT 195 194   BMET Recent Labs    01/09/23 1623 01/10/23 0531  NA 141 137  K 3.6 3.8  CL 109 109  CO2 21* 23  GLUCOSE 141* 135*  BUN 11 11  CREATININE 0.86 0.81  CALCIUM 7.8* 7.8*   PT/INR Recent Labs    01/10/23 0531  LABPROT 20.9*  INR 1.8*   CMP     Component Value Date/Time   NA 137 01/10/2023 0531   K 3.8 01/10/2023 0531   CL 109 01/10/2023 0531   CO2 23 01/10/2023 0531   GLUCOSE 135 (H) 01/10/2023 0531   BUN 11 01/10/2023 0531   CREATININE 0.81 01/10/2023 0531   CREATININE 0.92 (H) 05/21/2020 0000   CALCIUM 7.8 (L) 01/10/2023 0531   PROT 7.0 01/21/2022 1543   ALBUMIN 3.2 (L) 01/21/2022 1543   AST 26 01/21/2022 1543   ALT 19 01/21/2022 1543   ALKPHOS 82 01/21/2022 1543   BILITOT 0.9 01/21/2022 1543   GFRNONAA >60 01/10/2023 0531   GFRNONAA 55 (L) 05/21/2020 0000   GFRAA 64 05/21/2020 0000   Lipase     Component Value Date/Time   LIPASE 24 01/21/2022 1543       Studies/Results: VAS [place] LOWER EXTREMITY VENOUS (DVT)  Result Date: 01/10/2023  Lower Venous DVT Study Patient Name:  [name]  Date of Exam:   01/10/2023 Medical Rec #: [id number]  Accession #:    [id number] Date of Birth: [birth year]         Patient Gender: F Patient Age:   89 years Exam Location:  [facility] Procedure:      VAS [place] LOWER EXTREMITY VENOUS (DVT) Referring Phys: [facility] GOEL --------------------------------------------------------------------------------  Indications: Swelling.  Risk Factors: None identified. Limitations: Poor ultrasound/tissue interface and patient positioning, patient immobility, patient pain tolerance. Comparison Study: No prior studies. Performing Technologist: Chanda Busing RVT  Examination Guidelines: A complete evaluation includes B-mode imaging, spectral Doppler, color Doppler, and power Doppler as needed of all accessible portions  of each vessel. Bilateral testing is considered an integral part of a complete examination. Limited examinations for reoccurring indications may be performed as noted. The reflux portion of the exam is performed with the patient in reverse Trendelenburg.  +---------+---------------+---------+-----------+----------+--------------+ RIGHT    CompressibilityPhasicitySpontaneityPropertiesThrombus Aging +---------+---------------+---------+-----------+----------+--------------+ CFV      Full           Yes      Yes                                 +---------+---------------+---------+-----------+----------+--------------+ SFJ      Full                                                        +---------+---------------+---------+-----------+----------+--------------+ FV Prox  Full                                                        +---------+---------------+---------+-----------+----------+--------------+ FV Mid   Full                                                        +---------+---------------+---------+-----------+----------+--------------+ FV DistalFull                                                        +---------+---------------+---------+-----------+----------+--------------+ PFV      Full                                                        +---------+---------------+---------+-----------+----------+--------------+ POP      Full           Yes      Yes                                 +---------+---------------+---------+-----------+----------+--------------+ PTV      Full                                                        +---------+---------------+---------+-----------+----------+--------------+  PERO     Full                                                        +---------+---------------+---------+-----------+----------+--------------+   +---------+---------------+---------+-----------+----------+-------------------+ LEFT      CompressibilityPhasicitySpontaneityPropertiesThrombus Aging      +---------+---------------+---------+-----------+----------+-------------------+ CFV      Full           Yes      Yes                                      +---------+---------------+---------+-----------+----------+-------------------+ SFJ      Full                                                             +---------+---------------+---------+-----------+----------+-------------------+ FV Prox  Full                                                             +---------+---------------+---------+-----------+----------+-------------------+ FV Mid                  Yes      Yes                                      +---------+---------------+---------+-----------+----------+-------------------+ FV Distal               Yes      Yes                                      +---------+---------------+---------+-----------+----------+-------------------+ PFV      Full                                                             +---------+---------------+---------+-----------+----------+-------------------+ POP                     Yes      Yes                                      +---------+---------------+---------+-----------+----------+-------------------+ PTV      Full                                                             +---------+---------------+---------+-----------+----------+-------------------+  PERO                                                  Not well visualized +---------+---------------+---------+-----------+----------+-------------------+    Summary: RIGHT: - There is no evidence of deep vein thrombosis in the lower extremity.  - No cystic structure found in the popliteal fossa.  LEFT: - There is no evidence of deep vein thrombosis in the lower extremity. However, portions of this examination were limited- see technologist comments above.  - No cystic structure found in  the popliteal fossa.  *See table(s) above for measurements and observations.    Preliminary    CT ABDOMEN PELVIS W CONTRAST  Result Date: 01/09/2023 CLINICAL DATA:  Psoas abscess, left hip infection, left flank rash EXAM: CT ABDOMEN AND PELVIS WITH CONTRAST TECHNIQUE: Multidetector CT imaging of the abdomen and pelvis was performed using the standard protocol following bolus administration of intravenous contrast. RADIATION DOSE REDUCTION: This exam was performed according to the departmental dose-optimization program which includes automated exposure control, adjustment of the mA and/or kV according to patient size and/or use of iterative reconstruction technique. CONTRAST:  OMNIPAQUE IOHEXOL 300 MG/ML  SOLN COMPARISON:  01/21/2022 FINDINGS: Lower chest: No acute abnormality. Hepatobiliary: 13 cm lobulated mass essentially replacing the lateral segment the left hepatic lobe demonstrates nodular peripheral enhancement with gradual fill-in on delayed images and is in keeping with a benign cavernous hemangioma. The liver is otherwise unremarkable. No intra or extrahepatic biliary ductal dilation. Cholelithiasis noted without pericholecystic inflammatory change. Pancreas: Unremarkable Spleen: Unremarkable Adrenals/Urinary Tract: The adrenal glands are unremarkable. The kidneys are normal in size and position. Nonobstructing renal calculi are noted within the upper poles of the kidneys bilaterally measuring up to 11 mm. No ureteral calculi. No hydronephrosis. Normal renal cortical enhancement. The bladder is unremarkable. Stomach/Bowel: Severe pancolonic diverticulosis without superimposed acute inflammatory change. Large volume stool within the rectal vault. Stomach, small bowel, and large bowel are otherwise unremarkable. No obstruction. Appendix normal. No free intraperitoneal gas or fluid. Vascular/Lymphatic: Inferior vena cava filter in expected position within the infrarenal cava. Mild aortoiliac  atherosclerotic calcification. No aortic aneurysm. No pathologic adenopathy within the abdomen and pelvis. Reproductive: Status post hysterectomy. No adnexal masses. Other: There is asymmetric subcutaneous edema noted within the left flank and visualized lateral left hip which may represent asymmetric, possibly positional, edema or a superimposed inflammatory change as can be seen with cellulitis. No loculated subcutaneous fluid collection is identified. No retroperitoneal fluid collection or hematoma identified. The iliopsoas musculature is unremarkable bilaterally. Musculoskeletal: Right total hip arthroplasty has been performed. Degenerative changes are seen within the lumbar spine and left hip. No acute bone abnormality. IMPRESSION: 1. Asymmetric subcutaneous edema within the left flank and visualized lateral left hip which may represent asymmetric, possibly positional, edema or a superimposed inflammatory change as can be seen with cellulitis. No loculated subcutaneous fluid collection is identified. No retroperitoneal fluid collection or hematoma identified. 2. Severe pancolonic diverticulosis without superimposed acute inflammatory change. 3. Cholelithiasis. 4. Bilateral nonobstructing renal calculi. 5. 13 cm benign cavernous hemangioma within the left hepatic lobe. Aortic Atherosclerosis (ICD10-I70.0). Electronically Signed   By: Helyn Numbers M.D.   On: 01/09/2023 20:00    Anti-infectives: Anti-infectives (From admission, onward)    Start     Dose/Rate Route Frequency Ordered Stop   01/10/23 0200  clindamycin (CLEOCIN)  IVPB 900 mg        900 mg 100 mL/hr over 30 Minutes Intravenous Every 8 hours 01/09/23 2148     01/10/23 0000  clindamycin (CLEOCIN) IVPB 600 mg  Status:  Discontinued        600 mg 100 mL/hr over 30 Minutes Intravenous Every 6 hours 01/09/23 2147 01/09/23 2148   01/09/23 2200  ceFAZolin (ANCEF) IVPB 2g/100 mL premix  Status:  Discontinued        2 g 200 mL/hr over 30 Minutes  Intravenous Every 12 hours 01/09/23 2140 01/09/23 2147   01/09/23 1700  clindamycin (CLEOCIN) IVPB 900 mg        900 mg 100 mL/hr over 30 Minutes Intravenous  Once 01/09/23 1655 01/09/23 2043        Assessment/Plan Hx of CVA with left hemiplegia   Stage III Pressure wound to left buttock/flank - CT with  Asymmetric subcutaneous edema within the left flank and visualized lateral left hip which may represent asymmetric, possibly positional, edema or a superimposed inflammatory change as can be seen with cellulitis - no crepitus or concern for NSTI on exam  - drainage appears more serous and weeping rather than purulent - reasonable to treat with pressure off loading and local wound care at this time. Patient and daughter to not wish to consider surgical debridement at this time. General surgery will sign off but please call if anything changes and they are willing to pursue surgery. Would recommend referral to wound care center on DC and help with obtaining air mattress for home if possible  FEN: reg diet VTE: Eliquis  ID: clindamycin  LOS: 1 day   I reviewed hospitalist notes, last 24 h vitals and pain scores, last 48 h intake and output, last 24 h labs and trends, and last 24 h imaging results.   Juliet Rude, [facility] Surgery 01/10/2023, 11:53 AM Please see Amion for pager number during day hours 7:00am-4:30pm

## 2023-01-10 NOTE — Consult Note (Addendum)
WOC Nurse Consult Note: this patient has been seen for surgical evaluation; see their note 01/09/2023; felt to be cellulitis  Reason for Consult: Posterior thigh wound  Wound type: 1.  Diffuse erythema with weeping to L lateral thigh and L flank; some scattered partial thickness skin loss  2.  Stage 3 PI L posterior thigh  Pressure Injury POA:  yes  Measurement: 1.  > 30 cm x 30 cm area of erythema, weeping with scattered small areas of partial thickness skin loss  2. Stage 3 PI L posterior thigh 5 cm x 1 cm 50% yellow 50% pink moist  3.  Irritant contact dermatitis under pannus L greater than R  ICD-10 CM Codes for Irritant Dermatitis  L30.4  - Erythema intertrigo. Also used for abrasion of the hand, chafing of the skin, dermatitis due to sweating and friction, friction dermatitis, friction eczema, and genital/thigh intertrigo.  Wound bed: as above  Drainage (amount, consistency, odor) serous fluid from area to L lateral thigh and flank; minimal tan exudate to L posterior thigh Stage 3  Periwound: erythematous, weeping  Dressing procedure/placement/frequency: Clean entire area L lateral thigh, posterior thigh and L flank with Vashe wound cleanser [employer] [place] [phone]), apply Medihoney to L posterior area of necrotic tissue daily and cover with dry gauze.  Place Interdry AG to entire area of erythematous weeping skin.  Order Hart [place] # [phone] Measure and cut length of InterDry to fit in skin folds that have skin breakdown Tuck InterDry fabric into skin folds in a single layer, allow for 2 inches of overhang from skin edges to allow for wicking to occur May remove to bathe; dry area thoroughly and then tuck into affected areas again Do not apply any creams or ointments when using InterDry DO NOT THROW AWAY FOR 5 DAYS unless soiled with stool DO NOT [facility] product, this will inactivate the silver in the material  New sheet of Interdry should be applied after 5 days of use if patient continues to have skin  breakdown    Also place Interdry underneath abdominal pannus same fashion as above    POC discussed with patient, sister and bedside nurse.    WOC team will not follow at this time. Re-consult if further needs arise.   Thank you,     Priscella Mann MSN, RN-BC, [employer] [phone]

## 2023-01-10 NOTE — TOC Initial Note (Signed)
Transition of Care [facility]) - Initial/Assessment Note    Patient Details  Name: [name] MRN: [id number] Date of Birth: 1929-04-13  Transition of [facility]) CM/SW Contact:    Harriett Sine, RN Phone Number:[phone]  01/10/2023, 9:57 PM  Clinical Narrative:                 Pt from home with nurse aid. No SDOH needs, pt has pcp, TOC following.   Expected Discharge Plan: Home w Home Health Services Barriers to Discharge: No Barriers Identified   Patient Goals and CMS Choice Patient states their goals for this hospitalization and ongoing recovery are:: none stated CMS Medicare.gov Compare Post Acute Care list provided to:: Patient        Expected Discharge Plan and Services       Living arrangements for the past 2 months: Single Family Home                                      Prior Living Arrangements/Services Living arrangements for the past 2 months: Single Family Home Lives with:: Adult Children, Self Patient language and need for interpreter reviewed:: Yes Do you feel safe going back to the place where you live?: Yes      Need for Family Participation in Patient Care: Yes (Comment) Care giver support system in place?: Yes (comment) Current home services: Housekeeping, Homehealth aide ([facility]) Criminal Activity/Legal Involvement Pertinent to Current Situation/Hospitalization: No - Comment as needed  Activities of Daily Living Home Assistive Devices/Equipment: Wheelchair ADL Screening (condition at time of admission) Does the patient have a NEW difficulty with bathing/dressing/toileting/self-feeding that is expected to last >3 days?: No Does the patient have a NEW difficulty with getting in/out of bed, walking, or climbing stairs that is expected to last >3 days?: No Does the patient have a NEW difficulty with communication that is expected to last >3 days?: No Is the patient deaf or have difficulty hearing?: No Does the patient have difficulty seeing,  even when wearing glasses/contacts?: No Does the patient have difficulty concentrating, remembering, or making decisions?: No  Permission Sought/Granted                  Emotional Assessment Appearance:: Appears stated age Attitude/Demeanor/Rapport: Engaged Affect (typically observed): Accepting, Calm Orientation: : Oriented to Self, Oriented to Place, Oriented to  Time, Oriented to Situation Alcohol / Substance Use: Tobacco Use, Alcohol Use Psych Involvement: No (comment)  Admission diagnosis:  Cellulitis [L03.90] Skin infection [L08.9] Patient Active Problem List   Diagnosis Date Noted   Cellulitis 01/09/2023   Pressure ulcer 01/09/2023   Eschar of toe 08/22/2022   Hematuria    Hemiplegia as late effect of cerebral infarction (HCC)    Anemia 01/21/2022   Diarrhea 01/21/2022   Occult blood positive stool 01/21/2022   Obesity (BMI 30-39.9) 01/28/2021   Acute CVA (cerebrovascular accident) (HCC) 01/25/2021   Acute kidney injury (HCC) 01/25/2021   CVA (cerebral vascular accident) (HCC) 01/06/2021   Essential hypertension 01/06/2021   Arthritis 01/06/2021   Pain due to onychomycosis of toenails of both feet 12/14/2018   Presbycusis of both ears 09/22/2015   Syncope 05/24/2013   PCP:  Fleet Contras, MD Pharmacy:   [facility] [id number] [place], [place] - [address] [place] [address] Phone: [phone] Fax: [phone]     Social Determinants of Health (SDOH) Social History: SDOH  Screenings   Food Insecurity: No Food Insecurity (01/10/2023)  Housing: Low Risk  (01/10/2023)  Transportation Needs: No Transportation Needs (01/10/2023)  Utilities: Not At Risk (01/10/2023)  Tobacco Use: High Risk (01/09/2023)   SDOH Interventions:     Readmission Risk Interventions    01/24/2022    8:45 AM  Readmission Risk Prevention Plan  Post Dischage Appt Complete  Medication Screening Complete  Transportation Screening Complete

## 2023-01-10 NOTE — Progress Notes (Addendum)
PROGRESS NOTE   [name]  WUJ:[id number]    DOB: 02-04-1929    DOA: 01/09/2023  PCP: Fleet Contras, MD   I have briefly reviewed patients previous medical records in [facility].  Chief Complaint  Patient presents with   Wound Infection    Brief Narrative:  89 year old female, lives at home with her daughter, baseline nonambulatory, has all DME at home including Michiel Sites lift, wheelchair etc., daughter works but has CNA's 40 hours/week in between the daughter and CNA, 24/7 coverage, PMH of CVA with residual left-sided hemiplegia HTN, HLD, DVT on Eliquis, presented to the ED on 01/09/2023 with skin breakdown, clear drainage, pain and redness around left hip, progressive over the last 3 weeks.  Seen by PCP by telehealth and advised to come to the ED for evaluation.  Admitted for left hip area/thigh cellulitis.  IV antibiotics.  Wound care.   Assessment & Plan:  Principal Problem:   Cellulitis Active Problems:   CVA (cerebral vascular accident) (HCC)   Pressure ulcer   Left hip/thigh cellulitis with left hip wound Precipitated by nonambulatory status and prolonged laying in bed without turning Per daughter, reportedly turned once every "9 hours".  Advised that she needs frequent turning in bed up to every 2 hours and good local hygiene. CT A/P showed subcutaneous edema within the left flank and visualized lateral left hip, could have edema with superimposed inflammatory changes such as cellulitis.  No loculated fluid collection. Bilateral lower extremity venous Dopplers negative for DVT. Blood cultures x 2: Negative to date. WOC RN has already seen and placed wound care instructions Was started on empiric IV clindamycin but as per communication with pharmacy, since only allergy to penicillin is passing out, recommended changing to Ancef-done.  Monitor closely. Frequent change of positions.  Added air overlay mattress. General surgery followed up and do not see role for  surgical intervention and signed off. May consider low-dose Lasix. Monitor closely.  Elevated lactate: Taking longer to clear.  However hesitant to give too much fluids given she has dependent edema on the left hip and lower extremity. Brief IV fluids and recheck lactate.  CVA with residual left hemiplegia Continue Eliquis. Nonambulatory.  Hyperlipidemia: Continue atorvastatin 20 mg daily at bedtime  Hypertension: Blood pressure soft.  Hold losartan for today and resume from tomorrow.  Anxiety/depression: Continue home dose of Lexapro  Anemia: Follow CBC in AM.  History of DVT: Continue Eliquis  There is no height or weight on file to calculate BMI.-Suspect morbid obesity pending weight Complicates care.   DVT prophylaxis: SCDs Start: 01/09/23 2126     Code Status: Full Code:  Family Communication: Daughter at bedside Disposition:  Status is: Inpatient Remains inpatient appropriate because: IV antibiotics     Consultants:     Procedures:     Antimicrobials:   IV clindamycin   Subjective:  Seen along with daughter at bedside.  Reports feels better.  Pain significantly improved.  Rest of history as noted above.  Objective:   Vitals:   01/09/23 1900 01/09/23 2326 01/10/23 0312 01/10/23 0650  BP: (!) 126/54 126/67 (!) 132/56 117/61  Pulse: 80 70 65 60  Resp: 16 17 17 16   Temp: 97.9 F (36.6 C) 98 F (36.7 C) 97.7 F (36.5 C) 97.8 F (36.6 C)  TempSrc:  Oral Oral Oral  SpO2: 94% 100% 97% 100%  Patient examined along with female charge nurse and patient's female nurse assistance.  General exam: Elderly female, moderately built and  obese lying comfortably propped up in bed without distress.  Eating breakfast with her right hand. Respiratory system: Clear to auscultation. Respiratory effort normal. Cardiovascular system: S1 & S2 heard, RRR. No JVD, murmurs, rubs, gallops or clicks. No pedal edema. Gastrointestinal system: Abdomen is nondistended but  obese, soft and nontender. No organomegaly or masses felt. Normal bowel sounds heard. Central nervous system: Alert and oriented. No focal neurological deficits. Extremities: Right upper extremity with grade 4+ by 5 power.  Right lower extremity with at least grade 2 x 5 power.  Left limbs grade 0 x 5 power. Skin: Left hip area, thigh, especially the medial thigh with swelling, faint redness especially the medial thigh with slight increase in local warmth, minimal tenderness, no fluctuance or overt drainage.  2 superficial ulcers noted on left buttock area as in picture below which are clean without evidence of overt infection. Psychiatry: Judgement and insight appear normal. Mood & affect appropriate.        Data Reviewed:   I have personally reviewed following labs and imaging studies   CBC: Recent Labs  Lab 01/09/23 1623 01/10/23 0531  WBC 14.8* 9.8  NEUTROABS 12.5*  --   HGB 12.7 11.1*  HCT 41.3 35.2*  MCV 92.6 91.9  PLT 195 194    Basic Metabolic Panel: Recent Labs  Lab 01/09/23 1623 01/10/23 0531  NA 141 137  K 3.6 3.8  CL 109 109  CO2 21* 23  GLUCOSE 141* 135*  BUN 11 11  CREATININE 0.86 0.81  CALCIUM 7.8* 7.8*    Liver Function Tests: No results for input(s): "AST", "ALT", "ALKPHOS", "BILITOT", "PROT", "ALBUMIN" in the last 168 hours.  CBG: No results for input(s): "GLUCAP" in the last 168 hours.  Microbiology Studies:   Recent Results (from the past 240 hour(s))  Blood culture (routine x 2)     Status: None (Preliminary result)   Collection Time: 01/09/23  5:28 PM   Specimen: BLOOD  Result Value Ref Range Status   Specimen Description   Final    BLOOD BLOOD RIGHT FOREARM Performed at [facility], [address] [place], [place] [address]    Special Requests   Final    BOTTLES DRAWN AEROBIC AND ANAEROBIC Blood Culture results may not be optimal due to an inadequate volume of blood received in culture bottles Performed at  [facility], [address], [place], [place] [address]    Culture   Final    NO GROWTH < 12 HOURS Performed at [facility], [address] [place], [place] [address]    Report Status PENDING  Incomplete  Blood culture (routine x 2)     Status: None (Preliminary result)   Collection Time: 01/09/23  5:32 PM   Specimen: BLOOD  Result Value Ref Range Status   Specimen Description   Final    BLOOD RIGHT ANTECUBITAL Performed at [facility], [address], [place], [place] [address]    Special Requests   Final    BOTTLES DRAWN AEROBIC AND ANAEROBIC Blood Culture results may not be optimal due to an inadequate volume of blood received in culture bottles Performed at [facility], [address], [place], [place] [address]    Culture   Final    NO GROWTH < 12 HOURS Performed at [facility], [address] [place], [place] [address]    Report Status PENDING  Incomplete    Radiology Studies:  CT ABDOMEN PELVIS W CONTRAST  Result Date: 01/09/2023 CLINICAL DATA:  Psoas abscess, left hip infection, left flank rash EXAM: CT ABDOMEN AND PELVIS WITH CONTRAST TECHNIQUE: Multidetector CT imaging of the abdomen and pelvis was performed using the standard protocol following bolus administration of intravenous contrast. RADIATION DOSE REDUCTION: This exam was performed according to the departmental dose-optimization program which includes automated exposure control, adjustment of the mA and/or kV according to patient size and/or use of iterative reconstruction technique. CONTRAST:  OMNIPAQUE IOHEXOL 300 MG/ML  SOLN COMPARISON:  01/21/2022 FINDINGS: Lower chest: No acute abnormality. Hepatobiliary: 13 cm lobulated mass essentially replacing the lateral segment the left hepatic lobe demonstrates nodular peripheral enhancement with gradual fill-in on delayed images and is in keeping with a benign cavernous hemangioma. The liver  is otherwise unremarkable. No intra or extrahepatic biliary ductal dilation. Cholelithiasis noted without pericholecystic inflammatory change. Pancreas: Unremarkable Spleen: Unremarkable Adrenals/Urinary Tract: The adrenal glands are unremarkable. The kidneys are normal in size and position. Nonobstructing renal calculi are noted within the upper poles of the kidneys bilaterally measuring up to 11 mm. No ureteral calculi. No hydronephrosis. Normal renal cortical enhancement. The bladder is unremarkable. Stomach/Bowel: Severe pancolonic diverticulosis without superimposed acute inflammatory change. Large volume stool within the rectal vault. Stomach, small bowel, and large bowel are otherwise unremarkable. No obstruction. Appendix normal. No free intraperitoneal gas or fluid. Vascular/Lymphatic: Inferior vena cava filter in expected position within the infrarenal cava. Mild aortoiliac atherosclerotic calcification. No aortic aneurysm. No pathologic adenopathy within the abdomen and pelvis. Reproductive: Status post hysterectomy. No adnexal masses. Other: There is asymmetric subcutaneous edema noted within the left flank and visualized lateral left hip which may represent asymmetric, possibly positional, edema or a superimposed inflammatory change as can be seen with cellulitis. No loculated subcutaneous fluid collection is identified. No retroperitoneal fluid collection or hematoma identified. The iliopsoas musculature is unremarkable bilaterally. Musculoskeletal: Right total hip arthroplasty has been performed. Degenerative changes are seen within the lumbar spine and left hip. No acute bone abnormality. IMPRESSION: 1. Asymmetric subcutaneous edema within the left flank and visualized lateral left hip which may represent asymmetric, possibly positional, edema or a superimposed inflammatory change as can be seen with cellulitis. No loculated subcutaneous fluid collection is identified. No retroperitoneal fluid  collection or hematoma identified. 2. Severe pancolonic diverticulosis without superimposed acute inflammatory change. 3. Cholelithiasis. 4. Bilateral nonobstructing renal calculi. 5. 13 cm benign cavernous hemangioma within the left hepatic lobe. Aortic Atherosclerosis (ICD10-I70.0). Electronically Signed   By: Helyn Numbers M.D.   On: 01/09/2023 20:00    Scheduled Meds:    apixaban  5 mg Oral BID   nystatin   Topical BID   pregabalin  100 mg Oral TID   sodium chloride flush  3 mL Intravenous Q12H    Continuous Infusions:    clindamycin (CLEOCIN) IV 900 mg (01/10/23 0041)   lactated ringers 125 mL/hr at 01/09/23 2205     LOS: 1 day     Marcellus Scott, MD,  FACP, [facility], [facility], [facility]   [facility]      To contact the attending provider between 7A-7P or the covering provider during after hours 7P-7A, please log into the web site [email or web address] and access using universal [place] password for that web site. If you do not have the password, please call the hospital operator.  01/10/2023, 9:14 AM

## 2023-01-10 NOTE — Progress Notes (Signed)
Bilateral lower extremity venous duplex has been completed. Preliminary results can be found in CV Proc through chart review.   01/10/23 9:21 AM Olen Cordial RVT

## 2023-01-10 NOTE — Plan of Care (Signed)
  Problem: Education: Goal: Knowledge of General Education information will improve Description: Including pain rating scale, medication(s)/side effects and non-pharmacologic comfort measures Outcome: Progressing   Problem: Safety: Goal: Ability to remain free from injury will improve Outcome: Progressing   Problem: Skin Integrity: Goal: Risk for impaired skin integrity will decrease Outcome: Not Progressing

## 2023-01-11 DIAGNOSIS — L03317 Cellulitis of buttock: Secondary | ICD-10-CM | POA: Diagnosis not present

## 2023-01-11 LAB — CBC
HCT: 40.7 % (ref 36.0–46.0)
Hemoglobin: 12.5 g/dL (ref 12.0–15.0)
MCH: 27.7 pg (ref 26.0–34.0)
MCHC: 30.7 g/dL (ref 30.0–36.0)
MCV: 90 fL (ref 80.0–100.0)
Platelets: 168 10*3/uL (ref 150–400)
RBC: 4.52 MIL/uL (ref 3.87–5.11)
RDW: 16.9 % — ABNORMAL HIGH (ref 11.5–15.5)
WBC: 8.7 10*3/uL (ref 4.0–10.5)
nRBC: 0 % (ref 0.0–0.2)

## 2023-01-11 LAB — HEPATIC FUNCTION PANEL
ALT: 16 U/L (ref 0–44)
AST: 24 U/L (ref 15–41)
Albumin: 2.4 g/dL — ABNORMAL LOW (ref 3.5–5.0)
Alkaline Phosphatase: 61 U/L (ref 38–126)
Bilirubin, Direct: 0.2 mg/dL (ref 0.0–0.2)
Indirect Bilirubin: 0.3 mg/dL (ref 0.3–0.9)
Total Bilirubin: 0.5 mg/dL (ref 0.3–1.2)
Total Protein: 6.3 g/dL — ABNORMAL LOW (ref 6.5–8.1)

## 2023-01-11 LAB — LACTIC ACID, PLASMA
Lactic Acid, Venous: 3.1 mmol/L (ref 0.5–1.9)
Lactic Acid, Venous: 3.4 mmol/L (ref 0.5–1.9)

## 2023-01-11 MED ORDER — ALBUTEROL SULFATE (2.5 MG/3ML) 0.083% IN NEBU
2.5000 mg | INHALATION_SOLUTION | Freq: Two times a day (BID) | RESPIRATORY_TRACT | Status: DC
  Administered 2023-01-11 – 2023-01-13 (×4): 2.5 mg via RESPIRATORY_TRACT
  Filled 2023-01-11 (×3): qty 3

## 2023-01-11 NOTE — Plan of Care (Signed)

## 2023-01-11 NOTE — Evaluation (Signed)
Physical Therapy Evaluation Patient Details Name: [name] MRN: [id number] DOB: 08/02/1928 Today's Date: 01/11/2023  History of Present Illness  89 year old female, lives at home with her daughter, baseline nonambulatory, has all DME at home including [employer]iel Sites lift, wheelchair etc., daughter works but has CNA's 40 hours/week in between the daughter and CNA, 24/7 coverage, PMH of CVA with residual left-sided hemiplegia HTN, HLD, DVT on Eliquis, presented to the ED on 01/09/2023 with skin breakdown, clear drainage, pain and redness around left hip, progressive over the last 3 weeks.  Seen by PCP by telehealth and advised to come to the ED for evaluation.  Admitted for left hip area/thigh cellulitis.  Clinical Impression  Pt is bedbound at baseline 2* L hemiplegia from CVA x 2. Family and home health aide provide 34* assistance for pt. Hoyer lift is utilized for transfers to a WC. +2 total assist today to roll to L in bed. Pt's daughter wants to take pt home and is requesting home health wound care for pt's L hip. As pt is at baseline with mobility, no further PT indicated at this time, will sign off.         If plan is discharge home, recommend the following:     Can travel by private vehicle        Equipment Recommendations None recommended by PT  Recommendations for Other Services       Functional Status Assessment Patient has not had a recent decline in their functional status     Precautions / Restrictions Precautions Precautions: Other (comment) Precaution Comments: bedbound at baseline Restrictions Weight Bearing Restrictions: No      Mobility  Bed Mobility Overal bed mobility: Needs Assistance Bed Mobility: Rolling Rolling: +2 for physical assistance, Total assist         General bed mobility comments: +2 total for rolling L; mechanical lift for transfers at baseline    Transfers                        Ambulation/Gait               General  Gait Details: Michiel Sites for transfers at baseline  Acupuncturist Bed    Modified Rankin (Stroke Patients Only)       Balance                                             Pertinent Vitals/Pain Pain Assessment Pain Assessment: Faces Faces Pain Scale: Hurts little more Pain Location: L knee with attempted flexion ROM Pain Descriptors / Indicators: Grimacing Pain Intervention(s): Limited activity within patient's tolerance, Monitored during session, Repositioned    Home Living Family/patient expects to be discharged to:: Private residence Living Arrangements: Children Available Help at Discharge: Family;Personal care attendant;Available 24 hours/day Type of Home: House         Home Layout: One level Home Equipment: Wheelchair - Electronics engineer (2 wheels);Cane - single point;Tub bench Additional Comments: Michiel Sites lift; pt lives with daughter who works, 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    Prior Function Prior Level of Function : Needs assist       Physical Assist : Mobility (physical);ADLs (physical) Mobility (physical): Bed mobility;Transfers ADLs (physical): Bathing;Dressing;Toileting;Grooming;IADLs Mobility Comments: Michiel Sites for transfers; +2 to position  pt in [facility]; nonambulatory at baseline since CVA ADLs Comments: assist for bathing/dressing     Extremity/Trunk Assessment   Upper Extremity Assessment Upper Extremity Assessment: Defer to OT evaluation;RUE deficits/detail;LUE deficits/detail RUE Deficits / Details: able to actively elevate RUE and reach for rail for rolling LUE Deficits / Details: no functional use of LLE 2* CVA with L HP    Lower Extremity Assessment Lower Extremity Assessment: LLE deficits/detail;RLE deficits/detail RLE Deficits / Details: knee flexion AAROM ~30* limited by pain RLE Sensation: WNL LLE Deficits / Details: foot numb to light touch, trace quad contraction noted,  pain with attempted knee flexion AAROM; per daughter pt isn't able to functionally use LLE 2* CVA with L HP LLE Sensation: decreased light touch       Communication   Communication Communication: No apparent difficulties  Cognition Arousal: Alert Behavior During Therapy: WFL for tasks assessed/performed Overall Cognitive Status: Within Functional Limits for tasks assessed                                          General Comments      Exercises     Assessment/Plan    PT Assessment Patient does not need any further PT services  PT Problem List         PT Treatment Interventions      PT Goals (Current goals can be found in the Care Plan section)  Acute Rehab PT Goals Patient Stated Goal: to get home health wound care PT Goal Formulation: All assessment and education complete, DC therapy    Frequency       Co-evaluation               AM-PAC PT "6 Clicks" Mobility  Outcome Measure Help needed turning from your back to your side while in a flat bed without using bedrails?: Total Help needed moving from lying on your back to sitting on the side of a flat bed without using bedrails?: Total Help needed moving to and from a bed to a chair (including a wheelchair)?: Total Help needed standing up from a chair using your arms (e.g., wheelchair or bedside chair)?: Total Help needed to walk in hospital room?: Total Help needed climbing 3-5 steps with a railing? : Total 6 Click Score: 6    End of Session   Activity Tolerance: Patient tolerated treatment well Patient left: in bed;with call bell/phone within reach;with bed alarm set;with family/visitor present Nurse Communication: Mobility status;Need for lift equipment      Time: 1007-1029 PT Time Calculation (min) (ACUTE ONLY): 22 min   Charges:   PT Evaluation $PT Eval Moderate Complexity: 1 Mod   PT General Charges $$ ACUTE PT VISIT: 1 Visit         Tamala Ser PT  01/11/2023  Acute Rehabilitation Services  Office [phone]

## 2023-01-11 NOTE — Progress Notes (Addendum)
[facility] PROGRESS NOTE    Progress Note  [name]  ZOX:[id number] DOB: Dec 20, 1928 DOA: 01/09/2023 PCP: Fleet Contras, MD     Brief Narrative:   [name] is an 89 y.o. femalepast medical history of CVA with left residual hemiparesis who resides at home presents nonambulatory, essential hypertension DVT on Eliquis comes in for skin breakdown pain and redness around left hip that has gotten worse over the last 3 weeks saw PCP through telehealth who recommended to come into the ED.   Assessment/Plan:   Left hip and thigh cellulitis/wound: Likely precipitated by none ambulation and prolonged laying in bed. Been advised to turn every 2 hours. CT scan of the abdomen pelvis showed subcutaneous edema the left flank and left hip.  No abscess or fluid collection. Bilateral lower extremity Doppler negative for DVT blood cultures negative till date. Wound care has been consulted. General surgery was consulted and patient and daughter do not wish to go surgical debridement, they recommended wound care referral upon discharge and obtain an air mattress for home if possible. Started currently on antibiotics now de-escalated to cefazolin, will transition to oral Bactrim upon discharge. General Surgery was consulted and recommended no intervention. Wound care was consulted apply wound clean's and daily to the necrotic area is Interdry Ag to the anterior area  Elevated lactate: Slowly trending down. Check LFTs  CVA with residual left hemiplegia: Continue Eliquis she is nonambulatory.  Hyperlipidemia:  continue Crestor.  Essential hypertension: Holding ARB blood pressure is soft.  Anxiety/depression: Continue Lexapro.  Normocytic anemia: Hemoglobin is stable.  History of DVT: Continue Eliquis.   DVT prophylaxis: Eliquis Family Communication:none Status is: Inpatient Remains inpatient appropriate because: Cellulitis of the left hip    Code Status:      Code Status Orders  (From admission, onward)           Start     Ordered   01/09/23 2127  Full code  Continuous       Question:  By:  Answer:  Consent: discussion documented in EHR   01/09/23 2126           Code Status History     Date Active Date Inactive Code Status Order ID Comments User Context   01/21/2022 2314 01/24/2022 2111 Full Code [id number]  Gery Pray, MD ED   01/25/2021 2042 01/28/2021 2133 Full Code [id number]  Synetta Fail, MD ED   01/06/2021 1805 01/08/2021 1606 Full Code [id number]  Clydie Braun, MD Inpatient   01/06/2021 1743 01/06/2021 1805 Full Code [id number]  Clydie Braun, MD Inpatient         IV Access:   Peripheral IV   Procedures and diagnostic studies:   VAS [place] LOWER EXTREMITY VENOUS (DVT)  Result Date: 01/10/2023  Lower Venous DVT Study Patient Name:  [name]  Date of Exam:   01/10/2023 Medical Rec #: [id number]         Accession #:    [id number] Date of Birth: [birth year]         Patient Gender: F Patient Age:   28 years Exam Location:  [facility] Procedure:      VAS [place] LOWER EXTREMITY VENOUS (DVT) Referring Phys: [facility] GOEL --------------------------------------------------------------------------------  Indications: Swelling.  Risk Factors: None identified. Limitations: Poor ultrasound/tissue interface and patient positioning, patient immobility, patient pain tolerance. Comparison Study: No prior studies. Performing Technologist: Chanda Busing RVT  Examination Guidelines: A complete evaluation includes B-mode imaging, spectral Doppler, color  Doppler, and power Doppler as needed of all accessible portions of each vessel. Bilateral testing is considered an integral part of a complete examination. Limited examinations for reoccurring indications may be performed as noted. The reflux portion of the exam is performed with the patient in reverse Trendelenburg.   +---------+---------------+---------+-----------+----------+--------------+ RIGHT    CompressibilityPhasicitySpontaneityPropertiesThrombus Aging +---------+---------------+---------+-----------+----------+--------------+ CFV      Full           Yes      Yes                                 +---------+---------------+---------+-----------+----------+--------------+ SFJ      Full                                                        +---------+---------------+---------+-----------+----------+--------------+ FV Prox  Full                                                        +---------+---------------+---------+-----------+----------+--------------+ FV Mid   Full                                                        +---------+---------------+---------+-----------+----------+--------------+ FV DistalFull                                                        +---------+---------------+---------+-----------+----------+--------------+ PFV      Full                                                        +---------+---------------+---------+-----------+----------+--------------+ POP      Full           Yes      Yes                                 +---------+---------------+---------+-----------+----------+--------------+ PTV      Full                                                        +---------+---------------+---------+-----------+----------+--------------+ PERO     Full                                                        +---------+---------------+---------+-----------+----------+--------------+   +---------+---------------+---------+-----------+----------+-------------------+  LEFT     CompressibilityPhasicitySpontaneityPropertiesThrombus Aging      +---------+---------------+---------+-----------+----------+-------------------+ CFV      Full           Yes      Yes                                       +---------+---------------+---------+-----------+----------+-------------------+ SFJ      Full                                                             +---------+---------------+---------+-----------+----------+-------------------+ FV Prox  Full                                                             +---------+---------------+---------+-----------+----------+-------------------+ FV Mid                  Yes      Yes                                      +---------+---------------+---------+-----------+----------+-------------------+ FV Distal               Yes      Yes                                      +---------+---------------+---------+-----------+----------+-------------------+ PFV      Full                                                             +---------+---------------+---------+-----------+----------+-------------------+ POP                     Yes      Yes                                      +---------+---------------+---------+-----------+----------+-------------------+ PTV      Full                                                             +---------+---------------+---------+-----------+----------+-------------------+ PERO                                                  Not well visualized +---------+---------------+---------+-----------+----------+-------------------+     Summary: RIGHT: - There is no  evidence of deep vein thrombosis in the lower extremity.  - No cystic structure found in the popliteal fossa.  LEFT: - There is no evidence of deep vein thrombosis in the lower extremity. However, portions of this examination were limited- see technologist comments above.  - No cystic structure found in the popliteal fossa.  *See table(s) above for measurements and observations. Electronically signed by Waverly Ferrari MD on 01/10/2023 at 2:56:43 PM.    Final    CT ABDOMEN PELVIS W CONTRAST  Result Date: 01/09/2023 CLINICAL  DATA:  Psoas abscess, left hip infection, left flank rash EXAM: CT ABDOMEN AND PELVIS WITH CONTRAST TECHNIQUE: Multidetector CT imaging of the abdomen and pelvis was performed using the standard protocol following bolus administration of intravenous contrast. RADIATION DOSE REDUCTION: This exam was performed according to the departmental dose-optimization program which includes automated exposure control, adjustment of the mA and/or kV according to patient size and/or use of iterative reconstruction technique. CONTRAST:  OMNIPAQUE IOHEXOL 300 MG/ML  SOLN COMPARISON:  01/21/2022 FINDINGS: Lower chest: No acute abnormality. Hepatobiliary: 13 cm lobulated mass essentially replacing the lateral segment the left hepatic lobe demonstrates nodular peripheral enhancement with gradual fill-in on delayed images and is in keeping with a benign cavernous hemangioma. The liver is otherwise unremarkable. No intra or extrahepatic biliary ductal dilation. Cholelithiasis noted without pericholecystic inflammatory change. Pancreas: Unremarkable Spleen: Unremarkable Adrenals/Urinary Tract: The adrenal glands are unremarkable. The kidneys are normal in size and position. Nonobstructing renal calculi are noted within the upper poles of the kidneys bilaterally measuring up to 11 mm. No ureteral calculi. No hydronephrosis. Normal renal cortical enhancement. The bladder is unremarkable. Stomach/Bowel: Severe pancolonic diverticulosis without superimposed acute inflammatory change. Large volume stool within the rectal vault. Stomach, small bowel, and large bowel are otherwise unremarkable. No obstruction. Appendix normal. No free intraperitoneal gas or fluid. Vascular/Lymphatic: Inferior vena cava filter in expected position within the infrarenal cava. Mild aortoiliac atherosclerotic calcification. No aortic aneurysm. No pathologic adenopathy within the abdomen and pelvis. Reproductive: Status post hysterectomy. No adnexal masses.  Other: There is asymmetric subcutaneous edema noted within the left flank and visualized lateral left hip which may represent asymmetric, possibly positional, edema or a superimposed inflammatory change as can be seen with cellulitis. No loculated subcutaneous fluid collection is identified. No retroperitoneal fluid collection or hematoma identified. The iliopsoas musculature is unremarkable bilaterally. Musculoskeletal: Right total hip arthroplasty has been performed. Degenerative changes are seen within the lumbar spine and left hip. No acute bone abnormality. IMPRESSION: 1. Asymmetric subcutaneous edema within the left flank and visualized lateral left hip which may represent asymmetric, possibly positional, edema or a superimposed inflammatory change as can be seen with cellulitis. No loculated subcutaneous fluid collection is identified. No retroperitoneal fluid collection or hematoma identified. 2. Severe pancolonic diverticulosis without superimposed acute inflammatory change. 3. Cholelithiasis. 4. Bilateral nonobstructing renal calculi. 5. 13 cm benign cavernous hemangioma within the left hepatic lobe. Aortic Atherosclerosis (ICD10-I70.0). Electronically Signed   By: Helyn Numbers M.D.   On: 01/09/2023 20:00     Medical Consultants:   None.   Subjective:    [name] she has no new complaints.l  Objective:    Vitals:   01/10/23 2007 01/11/23 0552 01/11/23 0700 01/11/23 0937  BP: (!) 185/67 (!) 156/74 (!) 157/80   Pulse: 73 64 65   Resp: 20 18 19    Temp: 98.3 F (36.8 C) 98.2 F (36.8 C) 98.1 F (36.7 C)   TempSrc:  SpO2: 100% 97% 97% 93%  Weight:      Height:       SpO2: 93 %   Intake/Output Summary (Last 24 hours) at 01/11/2023 1126 Last data filed at 01/11/2023 0400 Gross per 24 hour  Intake 813.05 ml  Output --  Net 813.05 ml   Filed Weights   01/10/23 1121  Weight: 123.5 kg    Exam: General exam: In no acute distress. Respiratory system: Good air  movement and clear to auscultation. Cardiovascular system: S1 & S2 heard, RRR.  Gastrointestinal system: Abdomen is nondistended, soft and nontender.  Extremities: No pedal edema. Skin: Diffuse erythema with weeping to the lateral left thigh with some scattered partial thickness skin loss, the area is about 30 x 30 cm and is weeping Psychiatry: Judgement and insight appear normal. Mood & affect appropriate.    Data Reviewed:    Labs: Basic Metabolic Panel: Recent Labs  Lab 01/09/23 1623 01/10/23 0531  NA 141 137  K 3.6 3.8  CL 109 109  CO2 21* 23  GLUCOSE 141* 135*  BUN 11 11  CREATININE 0.86 0.81  CALCIUM 7.8* 7.8*   GFR Estimated Creatinine Clearance: 61.4 mL/min (by C-G formula based on SCr of 0.81 mg/dL). Liver Function Tests: No results for input(s): "AST", "ALT", "ALKPHOS", "BILITOT", "PROT", "ALBUMIN" in the last 168 hours. No results for input(s): "LIPASE", "AMYLASE" in the last 168 hours. No results for input(s): "AMMONIA" in the last 168 hours. Coagulation profile Recent Labs  Lab 01/10/23 0531  INR 1.8*   COVID-19 Labs  No results for input(s): "DDIMER", "FERRITIN", "LDH", "CRP" in the last 72 hours.  Lab Results  Component Value Date   SARSCOV2NAA NEGATIVE 01/21/2022   SARSCOV2NAA NEGATIVE 01/27/2021   SARSCOV2NAA NEGATIVE 01/25/2021   SARSCOV2NAA NEGATIVE 01/06/2021    CBC: Recent Labs  Lab 01/09/23 1623 01/10/23 0531 01/11/23 0548  WBC 14.8* 9.8 8.7  NEUTROABS 12.5*  --   --   HGB 12.7 11.1* 12.5  HCT 41.3 35.2* 40.7  MCV 92.6 91.9 90.0  PLT 195 194 168   Cardiac Enzymes: No results for input(s): "CKTOTAL", "CKMB", "CKMBINDEX", "TROPONINI" in the last 168 hours. BNP (last 3 results) No results for input(s): "PROBNP" in the last 8760 hours. CBG: No results for input(s): "GLUCAP" in the last 168 hours. D-Dimer: No results for input(s): "DDIMER" in the last 72 hours. Hgb A1c: No results for input(s): "HGBA1C" in the last 72  hours. Lipid Profile: No results for input(s): "CHOL", "HDL", "LDLCALC", "TRIG", "CHOLHDL", "LDLDIRECT" in the last 72 hours. Thyroid function studies: No results for input(s): "TSH", "T4TOTAL", "T3FREE", "THYROIDAB" in the last 72 hours.  Invalid input(s): "FREET3" Anemia work up: No results for input(s): "VITAMINB12", "FOLATE", "FERRITIN", "TIBC", "IRON", "RETICCTPCT" in the last 72 hours. Sepsis Labs: Recent Labs  Lab 01/09/23 1623 01/09/23 1630 01/10/23 0531 01/10/23 0959 01/10/23 1225 01/11/23 0002 01/11/23 0548  WBC 14.8*  --  9.8  --   --   --  8.7  LATICACIDVEN  --    < >  --  3.3* 3.2* 3.4* 3.1*   < > = values in this interval not displayed.   Microbiology Recent Results (from the past 240 hour(s))  Blood culture (routine x 2)     Status: None (Preliminary result)   Collection Time: 01/09/23  5:28 PM   Specimen: BLOOD RIGHT FOREARM  Result Value Ref Range Status   Specimen Description   Final    BLOOD RIGHT FOREARM Performed at  [facility], [address], [place], [place] [address]    Special Requests   Final    BOTTLES DRAWN AEROBIC AND ANAEROBIC Blood Culture results may not be optimal due to an inadequate volume of blood received in culture bottles Performed at [facility], [address], [place], [place] [address]    Culture   Final    NO GROWTH 2 DAYS Performed at [facility], [address], [place], [place] [address]    Report Status PENDING  Incomplete  Blood culture (routine x 2)     Status: None (Preliminary result)   Collection Time: 01/09/23  5:32 PM   Specimen: BLOOD  Result Value Ref Range Status   Specimen Description   Final    BLOOD RIGHT ANTECUBITAL Performed at [facility], [address] [place], [place] [address]    Special Requests   Final    BOTTLES DRAWN AEROBIC AND ANAEROBIC Blood Culture results may not be optimal due to an inadequate volume of blood received in culture  bottles Performed at [facility], [address], [place], [place] [address]    Culture   Final    NO GROWTH 2 DAYS Performed at [facility], [address] [place], [place] [address]    Report Status PENDING  Incomplete     Medications:    acidophilus  1 capsule Oral Daily   albuterol  2.5 mg Nebulization BID   apixaban  5 mg Oral BID   atorvastatin  20 mg Oral QHS   escitalopram  10 mg Oral QPM   feeding supplement  237 mL Oral BID BM   leptospermum manuka honey  1 Application Topical Daily   nystatin   Topical BID   pregabalin  100 mg Oral TID   sodium chloride flush  3 mL Intravenous Q12H   Continuous Infusions:   ceFAZolin (ANCEF) IV 2 g (01/11/23 0920)      LOS: 2 days   Marinda Elk  [facility]  01/11/2023, 11:26 AM

## 2023-01-12 DIAGNOSIS — L089 Local infection of the skin and subcutaneous tissue, unspecified: Secondary | ICD-10-CM

## 2023-01-12 DIAGNOSIS — L03317 Cellulitis of buttock: Secondary | ICD-10-CM | POA: Diagnosis not present

## 2023-01-12 LAB — CULTURE, BLOOD (ROUTINE X 2)

## 2023-01-12 MED ORDER — SULFAMETHOXAZOLE-TRIMETHOPRIM 800-160 MG PO TABS
1.0000 | ORAL_TABLET | Freq: Two times a day (BID) | ORAL | Status: DC
  Administered 2023-01-12 – 2023-01-13 (×3): 1 via ORAL
  Filled 2023-01-12 (×3): qty 1

## 2023-01-12 MED ORDER — CEPHALEXIN 500 MG PO CAPS: 500.0000 mg | ORAL_CAPSULE | Freq: Three times a day (TID) | ORAL | Status: DC

## 2023-01-12 NOTE — Plan of Care (Signed)

## 2023-01-12 NOTE — [place] FL2 (Signed)
[place] MEDICAID FL2 LEVEL OF CARE FORM     IDENTIFICATION  Patient Name: [name] Birthdate: 04-17-1929 Sex: female Admission Date (Current Location): 01/09/2023  [facility] [place] Number:  Producer, television/film/video and Address:  [facility],  [address] [place], [place] [address]      Provider Number: [id number]  Attending Physician Name and Address:  Marinda Elk, MD  Relative Name and Phone Number:  Evette Doffing (Daughter)  [phone] [facility] Phone)    Current Level of Care:   Recommended Level of Care:   Prior Approval Number:    Date Approved/Denied:   PASRR Number: [id number]  Discharge Plan: SNF    Current Diagnoses: Patient Active Problem List   Diagnosis Date Noted   Cellulitis 01/09/2023   Pressure ulcer 01/09/2023   Eschar of toe 08/22/2022   Hematuria    Hemiplegia as late effect of cerebral infarction (HCC)    Anemia 01/21/2022   Diarrhea 01/21/2022   Occult blood positive stool 01/21/2022   Obesity (BMI 30-39.9) 01/28/2021   Acute CVA (cerebrovascular accident) (HCC) 01/25/2021   Acute kidney injury (HCC) 01/25/2021   CVA (cerebral vascular accident) (HCC) 01/06/2021   Essential hypertension 01/06/2021   Arthritis 01/06/2021   Pain due to onychomycosis of toenails of both feet 12/14/2018   Presbycusis of both ears 09/22/2015   Syncope 05/24/2013    Orientation RESPIRATION BLADDER Height & Weight     Self, Time, Situation, Place  Normal Incontinent Weight: 123.5 kg Height:  5\' 8"  (172.7 cm)  BEHAVIORAL SYMPTOMS/MOOD NEUROLOGICAL BOWEL NUTRITION STATUS      Incontinent Diet  AMBULATORY STATUS COMMUNICATION OF NEEDS Skin   Total Care Verbally PU Stage and Appropriate Care       PU Stage 4 Dressing: BID (Left posterior)               Personal Care Assistance Level of Assistance  Bathing, Feeding, Dressing, Total care Bathing Assistance: Maximum assistance Feeding assistance: Maximum assistance Dressing  Assistance: Maximum assistance Total Care Assistance: Maximum assistance   Functional Limitations Info  Sight, Hearing, Speech Sight Info: Adequate Hearing Info: Adequate Speech Info: Adequate    SPECIAL CARE FACTORS FREQUENCY                       Contractures Contractures Info: Not present    Additional Factors Info  Allergies   Allergies Info: Codeine,  Penicillins           Current Medications (01/12/2023):  This is the current hospital active medication list Current Facility-Administered Medications  Medication Dose Route Frequency Provider Last Rate Last Admin   acetaminophen (TYLENOL) tablet 650 mg  650 mg Oral Q6H PRN Nolberto Hanlon, MD   650 mg at 01/10/23 0036   Or   acetaminophen (TYLENOL) suppository 650 mg  650 mg Rectal Q6H PRN Nolberto Hanlon, MD       acidophilus (RISAQUAD) capsule 1 capsule  1 capsule Oral Daily Hongalgi, Anand D, MD   1 capsule at 01/12/23 1049   albuterol (PROVENTIL) (2.5 MG/3ML) 0.083% nebulizer solution 2.5 mg  2.5 mg Nebulization Q6H PRN Marcellus Scott D, MD   2.5 mg at 01/11/23 0936   albuterol (PROVENTIL) (2.5 MG/3ML) 0.083% nebulizer solution 2.5 mg  2.5 mg Nebulization BID Marinda Elk, MD   2.5 mg at 01/12/23 1939   apixaban (ELIQUIS) tablet 5 mg  5 mg Oral BID Nolberto Hanlon, MD   5 mg at 01/12/23 1049  atorvastatin (LIPITOR) tablet 20 mg  20 mg Oral QHS Hongalgi, Anand D, MD   20 mg at 01/11/23 2112   escitalopram (LEXAPRO) tablet 10 mg  10 mg Oral QPM Hongalgi, Maximino Green[place], MD   10 mg at 01/12/23 1839   feeding supplement (ENSURE ENLIVE / ENSURE PLUS) liquid 237 mL  237 mL Oral BID BM Juliet Rude, PA-C   237 mL at 01/12/23 1650   hydrOXYzine (ATARAX) tablet 10 mg  10 mg Oral Q6H PRN Elease Etienne, MD   10 mg at 01/12/23 1652   leptospermum manuka honey (MEDIHONEY) paste 1 Application  1 Application Topical Daily Elease Etienne, MD   1 Application at 01/12/23 1000   nystatin (MYCOSTATIN/NYSTOP) topical powder   Topical  BID Nolberto Hanlon, MD   Given at 01/12/23 1000   polyethylene glycol (MIRALAX / GLYCOLAX) packet 17 g  17 g Oral Daily PRN Nolberto Hanlon, MD       pregabalin (LYRICA) capsule 100 mg  100 mg Oral TID Nolberto Hanlon, MD   100 mg at 01/12/23 1649   simethicone (MYLICON) chewable tablet 80 mg  80 mg Oral Q6H PRN Hongalgi, Anand D, MD       sodium chloride flush (NS) 0.9 % injection 3 mL  3 mL Intravenous Q12H Nolberto Hanlon, MD   3 mL at 01/12/23 1050   sulfamethoxazole-trimethoprim (BACTRIM DS) 800-160 MG per tablet 1 tablet  1 tablet Oral Q12H Marinda Elk, MD   1 tablet at 01/12/23 1049     Discharge Medications: Please see discharge summary for a list of discharge medications.  Relevant Imaging Results:  Relevant Lab Results:   Additional Information    Harriett Sine, RN

## 2023-01-12 NOTE — Progress Notes (Signed)
[facility] PROGRESS NOTE    Progress Note  [name]  WUJ:[id number] DOB: 1928/10/21 DOA: 01/09/2023 PCP: Fleet Contras, MD     Brief Narrative:   [name] is an 89 y.o. femalepast medical history of CVA with left residual hemiparesis who resides at home presents nonambulatory, essential hypertension DVT on Eliquis comes in for skin breakdown pain and redness around left hip that has gotten worse over the last 3 weeks saw PCP through telehealth who recommended to come into the ED. CT scan of the abdomen pelvis showed no fluid collection or abscess.   Assessment/Plan:   Left hip and thigh cellulitis/wound: Likely precipitated by none ambulation and prolonged laying in bed. Venous advised to turn every 2-3 hours. Bilateral lower extremity Doppler negative for DVT blood cultures negative till date. Wound care has been consulted Wound caare apply wound clean's and daily to the necrotic area is Interdry Ag to the anterior area General surgery was consulted and patient and daughter do not wish to go surgical debridement, they recommended wound care referral upon discharge and obtain an air mattress for home if possible. Initially on cefazolin and transition to oral Bactrim. Physical therapy evaluated the patient will need skilled nursing facility, [facility] has been consulted. Patient is stable for discharge to skilled nursing facility.  Elevated lactate: Slowly trending down. Check LFTs  CVA with residual left hemiplegia: Continue Eliquis she is nonambulatory.  Hyperlipidemia:  continue Crestor.  Essential hypertension: Holding ARB blood pressure is soft.  Anxiety/depression: Continue Lexapro.  Normocytic anemia: Hemoglobin is stable.  History of DVT: Continue Eliquis.   DVT prophylaxis: Eliquis Family Communication:none Status is: Inpatient Remains inpatient appropriate because: Cellulitis of the left hip    Code Status:     Code Status Orders   (From admission, onward)           Start     Ordered   01/09/23 2127  Full code  Continuous       Question:  By:  Answer:  Consent: discussion documented in EHR   01/09/23 2126           Code Status History     Date Active Date Inactive Code Status Order ID Comments User Context   01/21/2022 2314 01/24/2022 2111 Full Code [id number]  Gery Pray, MD ED   01/25/2021 2042 01/28/2021 2133 Full Code [id number]  Synetta Fail, MD ED   01/06/2021 1805 01/08/2021 1606 Full Code [id number]  Clydie Braun, MD Inpatient   01/06/2021 1743 01/06/2021 1805 Full Code [id number]  Clydie Braun, MD Inpatient         IV Access:   Peripheral IV   Procedures and diagnostic studies:   VAS [place] LOWER EXTREMITY VENOUS (DVT)  Result Date: 01/10/2023  Lower Venous DVT Study Patient Name:  [name]  Date of Exam:   01/10/2023 Medical Rec #: [id number]         Accession #:    [id number] Date of Birth: [birth year]         Patient Gender: F Patient Age:   4 years Exam Location:  [facility] Procedure:      VAS [place] LOWER EXTREMITY VENOUS (DVT) Referring Phys: [facility] GOEL --------------------------------------------------------------------------------  Indications: Swelling.  Risk Factors: None identified. Limitations: Poor ultrasound/tissue interface and patient positioning, patient immobility, patient pain tolerance. Comparison Study: No prior studies. Performing Technologist: Chanda Busing RVT  Examination Guidelines: A complete evaluation includes B-mode imaging, spectral Doppler, color Doppler, and power  Doppler as needed of all accessible portions of each vessel. Bilateral testing is considered an integral part of a complete examination. Limited examinations for reoccurring indications may be performed as noted. The reflux portion of the exam is performed with the patient in reverse Trendelenburg.  +---------+---------------+---------+-----------+----------+--------------+ RIGHT     CompressibilityPhasicitySpontaneityPropertiesThrombus Aging +---------+---------------+---------+-----------+----------+--------------+ CFV      Full           Yes      Yes                                 +---------+---------------+---------+-----------+----------+--------------+ SFJ      Full                                                        +---------+---------------+---------+-----------+----------+--------------+ FV Prox  Full                                                        +---------+---------------+---------+-----------+----------+--------------+ FV Mid   Full                                                        +---------+---------------+---------+-----------+----------+--------------+ FV DistalFull                                                        +---------+---------------+---------+-----------+----------+--------------+ PFV      Full                                                        +---------+---------------+---------+-----------+----------+--------------+ POP      Full           Yes      Yes                                 +---------+---------------+---------+-----------+----------+--------------+ PTV      Full                                                        +---------+---------------+---------+-----------+----------+--------------+ PERO     Full                                                        +---------+---------------+---------+-----------+----------+--------------+   +---------+---------------+---------+-----------+----------+-------------------+  LEFT     CompressibilityPhasicitySpontaneityPropertiesThrombus Aging      +---------+---------------+---------+-----------+----------+-------------------+ CFV      Full           Yes      Yes                                      +---------+---------------+---------+-----------+----------+-------------------+ SFJ      Full                                                              +---------+---------------+---------+-----------+----------+-------------------+ FV Prox  Full                                                             +---------+---------------+---------+-----------+----------+-------------------+ FV Mid                  Yes      Yes                                      +---------+---------------+---------+-----------+----------+-------------------+ FV Distal               Yes      Yes                                      +---------+---------------+---------+-----------+----------+-------------------+ PFV      Full                                                             +---------+---------------+---------+-----------+----------+-------------------+ POP                     Yes      Yes                                      +---------+---------------+---------+-----------+----------+-------------------+ PTV      Full                                                             +---------+---------------+---------+-----------+----------+-------------------+ PERO                                                  Not well visualized +---------+---------------+---------+-----------+----------+-------------------+     Summary: RIGHT: - There is no  evidence of deep vein thrombosis in the lower extremity.  - No cystic structure found in the popliteal fossa.  LEFT: - There is no evidence of deep vein thrombosis in the lower extremity. However, portions of this examination were limited- see technologist comments above.  - No cystic structure found in the popliteal fossa.  *See table(s) above for measurements and observations. Electronically signed by Waverly Ferrari MD on 01/10/2023 at 2:56:43 PM.    Final      Medical Consultants:   None.   Subjective:    [name] is great no new complaints.  Objective:    Vitals:   01/11/23 0937 01/11/23 1943 01/11/23 2352  01/12/23 0517  BP:   (!) 119/47 (!) 133/45  Pulse:   72 80  Resp:   18 16  Temp:   98.1 F (36.7 C) 99.1 F (37.3 C)  TempSrc:   Oral   SpO2: 93% 97% 96% 95%  Weight:      Height:       SpO2: 95 %   Intake/Output Summary (Last 24 hours) at 01/12/2023 0754 Last data filed at 01/12/2023 0600 Gross per 24 hour  Intake 540 ml  Output 400 ml  Net 140 ml   Filed Weights   01/10/23 1121  Weight: 123.5 kg    Exam: General exam: In no acute distress. Respiratory system: Good air movement and clear to auscultation. Cardiovascular system: S1 & S2 heard, RRR. No JVD. Gastrointestinal system: Abdomen is nondistended, soft and nontender.  Extremities: No pedal edema. Skin: Diffuse erythema with weeping to the lateral left thigh with some scattered partial thickness skin loss, the area is about 30 x 30 cm and is weeping Psychiatry: Judgement and insight appear normal. Mood & affect appropriate.    Data Reviewed:    Labs: Basic Metabolic Panel: Recent Labs  Lab 01/09/23 1623 01/10/23 0531  NA 141 137  K 3.6 3.8  CL 109 109  CO2 21* 23  GLUCOSE 141* 135*  BUN 11 11  CREATININE 0.86 0.81  CALCIUM 7.8* 7.8*   GFR Estimated Creatinine Clearance: 61.4 mL/min (by C-G formula based on SCr of 0.81 mg/dL). Liver Function Tests: Recent Labs  Lab 01/11/23 1155  AST 24  ALT 16  ALKPHOS 61  BILITOT 0.5  PROT 6.3*  ALBUMIN 2.4*   No results for input(s): "LIPASE", "AMYLASE" in the last 168 hours. No results for input(s): "AMMONIA" in the last 168 hours. Coagulation profile Recent Labs  Lab 01/10/23 0531  INR 1.8*   COVID-19 Labs  No results for input(s): "DDIMER", "FERRITIN", "LDH", "CRP" in the last 72 hours.  Lab Results  Component Value Date   SARSCOV2NAA NEGATIVE 01/21/2022   SARSCOV2NAA NEGATIVE 01/27/2021   SARSCOV2NAA NEGATIVE 01/25/2021   SARSCOV2NAA NEGATIVE 01/06/2021    CBC: Recent Labs  Lab 01/09/23 1623 01/10/23 0531 01/11/23 0548  WBC 14.8*  9.8 8.7  NEUTROABS 12.5*  --   --   HGB 12.7 11.1* 12.5  HCT 41.3 35.2* 40.7  MCV 92.6 91.9 90.0  PLT 195 194 168   Cardiac Enzymes: No results for input(s): "CKTOTAL", "CKMB", "CKMBINDEX", "TROPONINI" in the last 168 hours. BNP (last 3 results) No results for input(s): "PROBNP" in the last 8760 hours. CBG: No results for input(s): "GLUCAP" in the last 168 hours. D-Dimer: No results for input(s): "DDIMER" in the last 72 hours. Hgb A1c: No results for input(s): "HGBA1C" in the last 72 hours. Lipid Profile: No results for input(s): "CHOL", "HDL", "  LDLCALC", "TRIG", "CHOLHDL", "LDLDIRECT" in the last 72 hours. Thyroid function studies: No results for input(s): "TSH", "T4TOTAL", "T3FREE", "THYROIDAB" in the last 72 hours.  Invalid input(s): "FREET3" Anemia work up: No results for input(s): "VITAMINB12", "FOLATE", "FERRITIN", "TIBC", "IRON", "RETICCTPCT" in the last 72 hours. Sepsis Labs: Recent Labs  Lab 01/09/23 1623 01/09/23 1630 01/10/23 0531 01/10/23 0959 01/10/23 1225 01/11/23 0002 01/11/23 0548  WBC 14.8*  --  9.8  --   --   --  8.7  LATICACIDVEN  --    < >  --  3.3* 3.2* 3.4* 3.1*   < > = values in this interval not displayed.   Microbiology Recent Results (from the past 240 hour(s))  Blood culture (routine x 2)     Status: None (Preliminary result)   Collection Time: 01/09/23  5:28 PM   Specimen: BLOOD RIGHT FOREARM  Result Value Ref Range Status   Specimen Description   Final    BLOOD RIGHT FOREARM Performed at [facility], [address] [place], [place] [address]    Special Requests   Final    BOTTLES DRAWN AEROBIC AND ANAEROBIC Blood Culture results may not be optimal due to an inadequate volume of blood received in culture bottles Performed at [facility], [address] [place], [place] [address]    Culture   Final    NO GROWTH 2 DAYS Performed at [facility], [address], [place], [place] [address]    Report  Status PENDING  Incomplete  Blood culture (routine x 2)     Status: None (Preliminary result)   Collection Time: 01/09/23  5:32 PM   Specimen: BLOOD  Result Value Ref Range Status   Specimen Description   Final    BLOOD RIGHT ANTECUBITAL Performed at [facility], [address] [place], [place] [address]    Special Requests   Final    BOTTLES DRAWN AEROBIC AND ANAEROBIC Blood Culture results may not be optimal due to an inadequate volume of blood received in culture bottles Performed at [facility], [address], [place], [place] [address]    Culture   Final    NO GROWTH 2 DAYS Performed at [facility], [address] [place], [place] [address]    Report Status PENDING  Incomplete     Medications:    acidophilus  1 capsule Oral Daily   albuterol  2.5 mg Nebulization BID   apixaban  5 mg Oral BID   atorvastatin  20 mg Oral QHS   escitalopram  10 mg Oral QPM   feeding supplement  237 mL Oral BID BM   leptospermum manuka honey  1 Application Topical Daily   nystatin   Topical BID   pregabalin  100 mg Oral TID   sodium chloride flush  3 mL Intravenous Q12H   Continuous Infusions:   ceFAZolin (ANCEF) IV 2 g (01/12/23 0023)      LOS: 3 days   Marinda Elk  [facility]  01/12/2023, 7:54 AM

## 2023-01-12 NOTE — Plan of Care (Signed)

## 2023-01-12 NOTE — TOC Progression Note (Addendum)
Transition of Care [facility]) - Progression Note    Patient Details  Name: [name] MRN: [id number] Date of Birth: [birth year]  [facility] CM/SW Contact  Harriett Sine, RN Phone Number: 01/12/2023, 10:42 AM  Clinical Narrative:    Spoke with pt and daughter Britta Mccreedy at the bedside about SNF placement for wound care. Family states  the wound care is too much for them to handle with nurse only twice a week. Britta Mccreedy states the family's HH option would be with [facility], SNF option would be with [facility]. The process of faxing out beds and seeking [facility] agency was explained to family.   PASRR [phone] A, FL2 done, bed request faxed  Expected Discharge Plan: Home w Home Health Services Barriers to Discharge: No Barriers Identified  Expected Discharge Plan and Services       Living arrangements for the past 2 months: Single Family Home                                       Social Determinants of Health (SDOH) Interventions SDOH Screenings   Food Insecurity: No Food Insecurity (01/10/2023)  Housing: Low Risk  (01/10/2023)  Transportation Needs: No Transportation Needs (01/10/2023)  Utilities: Not At Risk (01/10/2023)  Tobacco Use: High Risk (01/09/2023)    Readmission Risk Interventions    01/24/2022    8:45 AM  Readmission Risk Prevention Plan  Post Dischage Appt Complete  Medication Screening Complete  Transportation Screening Complete

## 2023-01-13 DIAGNOSIS — L03317 Cellulitis of buttock: Secondary | ICD-10-CM | POA: Diagnosis not present

## 2023-01-13 DIAGNOSIS — L89222 Pressure ulcer of left hip, stage 2: Secondary | ICD-10-CM

## 2023-01-13 MED ORDER — SULFAMETHOXAZOLE-TRIMETHOPRIM 800-160 MG PO TABS: 1.0000 | ORAL_TABLET | Freq: Two times a day (BID) | ORAL | 0 refills | Status: AC

## 2023-01-13 NOTE — TOC Transition Note (Signed)
Transition of Care [facility]) - CM/SW Discharge Note   Patient Details  Name: [name] MRN: [id number] Date of Birth: 1928-07-06  Transition of Care [facility]) CM/SW Contact:  Otelia Santee, LCSW Phone Number: 01/13/2023, 2:05 PM   Clinical Narrative:    Pt to return home w/ daughter. HHRN has been arranged with [facility] for wound care. Air pressure mattress has been ordered through [employer] and will be delivered to pt's home. Spoke with daughter to inform of discharge plan. Pt's daughter agreeable to plan. PTAR called for transportation home.    Final next level of care: Home w Home Health Services Barriers to Discharge: No Barriers Identified   Patient Goals and CMS Choice CMS Medicare.gov Compare Post Acute Care list provided to:: Legal Guardian Choice offered to / list presented to : Adult Children, HC POA / Guardian  Discharge Placement                  Patient to be transferred to facility by: PTAR Name of family member notified: Daughter Patient and family notified of of transfer: 01/13/23  Discharge Plan and Services Additional resources added to the After Visit Summary for                  DME Arranged: Specialty mattress DME Agency: [employer] Date DME Agency Contacted: 01/13/23 Time DME Agency Contacted: [phone] Representative spoke with at DME Agency: Zack HH Arranged: RN HH Agency: [facility] Date [facility] Agency Contacted: 01/13/23 Time HH Agency Contacted: [phone] Representative spoke with at [facility] Agency: Cindie  Social Determinants of Health (SDOH) Interventions SDOH Screenings   Food Insecurity: No Food Insecurity (01/10/2023)  Housing: Low Risk  (01/10/2023)  Transportation Needs: No Transportation Needs (01/10/2023)  Utilities: Not At Risk (01/10/2023)  Tobacco Use: High Risk (01/09/2023)     Readmission Risk Interventions    01/13/2023    2:04 PM 01/24/2022    8:45 AM  Readmission Risk Prevention Plan  Post Dischage Appt Complete Complete   Medication Screening Complete Complete  Transportation Screening Complete Complete

## 2023-01-13 NOTE — Discharge Summary (Signed)
Physician Discharge Summary  [name] AOZ:[id number] DOB: 29-Apr-1928 DOA: 01/09/2023  PCP: Fleet Contras, MD  Admit date: 01/09/2023 Discharge date: 01/13/2023  Admitted From: Home Disposition:  Home  Recommendations for Outpatient Follow-up:  Follow up with PCP in 1-2 weeks Home health agency to follow-up at home.  Home Health:Yes Equipment/Devices:None  Discharge Condition:Stable CODE STATUS:Full Diet recommendation: [employer]   Brief/Interim Summary: 89 y.o. femalepast medical history of CVA with left residual hemiparesis who resides at home presents nonambulatory, essential hypertension DVT on Eliquis comes in for skin breakdown pain and redness around left hip that has gotten worse over the last 3 weeks saw PCP through telehealth who recommended to come into the ED. CT scan of the abdomen pelvis showed no fluid collection or abscess.   Discharge Diagnoses:  Principal Problem:   Cellulitis Active Problems:   CVA (cerebral vascular accident) (HCC)   Pressure ulcer  Left hip and thigh cellulitis/pressure ulcer: Likely precipitated by 9 ambulation prolonged laying in the bed. Patient has been advised to an every 2-3 hours. CT scan of the abdomen pelvis showed no fluid or abscess collection. Bilateral lower extremity Doppler was negative for DVT. Blood cultures remain negative Wound care has been consulted recommended daily dressing changes. General Surgery was consulted and the patient and daughter did not wish to go surgical debridement they recommended to continue conservative management and follow-up with wound care as an outpatient. They also recommended an air mattress. She was initially started on antibiotic she has been de-escalated to oral Bactrim and she continue as an outpatient for total of 10 days. PT evaluated the patient was highly recommended to the daughter that she will need skilled nursing facility but she was adamant and want to take her  home. Home health has been consulted to follow-up with wound care at home.  Elevated lactate: Now improved.  History of CVA with residual left hemiparesis: Continue Eliquis.,  Patient is nonambulatory.  Hyperlipidemia: Continue Crestor.  Essential hypertension: No changes made to her medication.  Anxiety and depression: Continue Lexapro.  Normocytic anemia:  hemoglobin stable.  History of DVT: Continue Eliquis no changes made to her medication.      Discharge Instructions  Discharge Instructions     Diet - low sodium heart healthy   Complete by: As directed    Discharge wound care:   Complete by: As directed    Per wound care instructions.   Increase activity slowly   Complete by: As directed       Allergies as of 01/13/2023       Reactions   Codeine Other (See Comments)   Passed out   Penicillins Other (See Comments)   Passed out Has patient had a PCN reaction causing immediate rash, facial/tongue/throat swelling, SOB or lightheadedness with hypotension: Yes Has patient had a PCN reaction causing severe rash involving mucus membranes or skin necrosis: Unk Has patient had a PCN reaction that required hospitalization: No Has patient had a PCN reaction occurring within the last 10 years: No If all of the above answers are "NO", then may proceed with Cephalosporin use.        Medication List     STOP taking these medications    clotrimazole-betamethasone cream Commonly known as: LOTRISONE       TAKE these medications    albuterol 108 (90 Base) MCG/ACT inhaler Commonly known as: VENTOLIN HFA Inhale 1-2 puffs into the lungs every 6 (six) hours as needed for wheezing or shortness of  breath.   albuterol (2.5 MG/3ML) 0.083% nebulizer solution Commonly known as: PROVENTIL Take 2.5 mg by nebulization every 6 (six) hours as needed.   apixaban 5 MG Tabs tablet Commonly known as: ELIQUIS Take 5 mg by mouth 2 (two) times daily.   atorvastatin 20 MG  tablet Commonly known as: LIPITOR Take 1 tablet (20 mg total) by mouth daily. What changed: when to take this   diclofenac sodium 1 % Gel Commonly known as: VOLTAREN Apply 1 application topically 4 (four) times daily as needed (knee and hand pain).   escitalopram 10 MG tablet Commonly known as: LEXAPRO Take 10 mg by mouth every evening.   hydrOXYzine 10 MG tablet Commonly known as: ATARAX Take 10 mg by mouth every 6 (six) hours as needed.   lactobacillus acidophilus Tabs tablet Take 1 tablet by mouth daily.   loperamide 2 MG capsule Commonly known as: IMODIUM Take 1 capsule (2 mg total) by mouth as needed for diarrhea or loose stools.   losartan 50 MG tablet Commonly known as: COZAAR Take 50 mg by mouth daily.   meclizine 25 MG tablet Commonly known as: ANTIVERT Take 25 mg by mouth 3 (three) times daily as needed for dizziness. As needed for vertigo   Norel AD 4-10-325 MG Tabs Generic drug: Chlorphen-PE-Acetaminophen Take 1 tablet by mouth 2 (two) times daily as needed.   pregabalin 100 MG capsule Commonly known as: LYRICA Take 100 mg by mouth 3 (three) times daily.   sulfamethoxazole-trimethoprim 800-160 MG tablet Commonly known as: BACTRIM DS Take 1 tablet by mouth every 12 (twelve) hours for 6 days.   zinc oxide 20 % ointment Apply 1 Application topically 2 (two) times daily as needed for irritation.               Discharge Care Instructions  (From admission, onward)           Start     Ordered   01/13/23 0000  Discharge wound care:       Comments: Per wound care instructions.   01/13/23 0901            Allergies  Allergen Reactions   Codeine Other (See Comments)    Passed out   Penicillins Other (See Comments)    Passed out Has patient had a PCN reaction causing immediate rash, facial/tongue/throat swelling, SOB or lightheadedness with hypotension: Yes Has patient had a PCN reaction causing severe rash involving mucus membranes or  skin necrosis: Unk Has patient had a PCN reaction that required hospitalization: No Has patient had a PCN reaction occurring within the last 10 years: No If all of the above answers are "NO", then may proceed with Cephalosporin use.     Consultations: None   Procedures/Studies: VAS [place] LOWER EXTREMITY VENOUS (DVT)  Result Date: 01/10/2023  Lower Venous DVT Study Patient Name:  YAHAIRA BRUSKI  Date of Exam:   01/10/2023 Medical Rec #: [id number]         Accession #:    [id number] Date of Birth: [birth year]         Patient Gender: F Patient Age:   89 years Exam Location:  [facility] Procedure:      VAS [place] LOWER EXTREMITY VENOUS (DVT) Referring Phys: [facility] GOEL --------------------------------------------------------------------------------  Indications: Swelling.  Risk Factors: None identified. Limitations: Poor ultrasound/tissue interface and patient positioning, patient immobility, patient pain tolerance. Comparison Study: No prior studies. Performing Technologist: Chanda Busing RVT  Examination Guidelines: A complete evaluation includes B-mode  imaging, spectral Doppler, color Doppler, and power Doppler as needed of all accessible portions of each vessel. Bilateral testing is considered an integral part of a complete examination. Limited examinations for reoccurring indications may be performed as noted. The reflux portion of the exam is performed with the patient in reverse Trendelenburg.  +---------+---------------+---------+-----------+----------+--------------+ RIGHT    CompressibilityPhasicitySpontaneityPropertiesThrombus Aging +---------+---------------+---------+-----------+----------+--------------+ CFV      Full           Yes      Yes                                 +---------+---------------+---------+-----------+----------+--------------+ SFJ      Full                                                         +---------+---------------+---------+-----------+----------+--------------+ FV Prox  Full                                                        +---------+---------------+---------+-----------+----------+--------------+ FV Mid   Full                                                        +---------+---------------+---------+-----------+----------+--------------+ FV DistalFull                                                        +---------+---------------+---------+-----------+----------+--------------+ PFV      Full                                                        +---------+---------------+---------+-----------+----------+--------------+ POP      Full           Yes      Yes                                 +---------+---------------+---------+-----------+----------+--------------+ PTV      Full                                                        +---------+---------------+---------+-----------+----------+--------------+ PERO     Full                                                        +---------+---------------+---------+-----------+----------+--------------+   +---------+---------------+---------+-----------+----------+-------------------+  LEFT     CompressibilityPhasicitySpontaneityPropertiesThrombus Aging      +---------+---------------+---------+-----------+----------+-------------------+ CFV      Full           Yes      Yes                                      +---------+---------------+---------+-----------+----------+-------------------+ SFJ      Full                                                             +---------+---------------+---------+-----------+----------+-------------------+ FV Prox  Full                                                             +---------+---------------+---------+-----------+----------+-------------------+ FV Mid                  Yes      Yes                                       +---------+---------------+---------+-----------+----------+-------------------+ FV Distal               Yes      Yes                                      +---------+---------------+---------+-----------+----------+-------------------+ PFV      Full                                                             +---------+---------------+---------+-----------+----------+-------------------+ POP                     Yes      Yes                                      +---------+---------------+---------+-----------+----------+-------------------+ PTV      Full                                                             +---------+---------------+---------+-----------+----------+-------------------+ PERO                                                  Not well visualized +---------+---------------+---------+-----------+----------+-------------------+     Summary: RIGHT: - There is no  evidence of deep vein thrombosis in the lower extremity.  - No cystic structure found in the popliteal fossa.  LEFT: - There is no evidence of deep vein thrombosis in the lower extremity. However, portions of this examination were limited- see technologist comments above.  - No cystic structure found in the popliteal fossa.  *See table(s) above for measurements and observations. Electronically signed by Waverly Ferrari MD on 01/10/2023 at 2:56:43 PM.    Final    CT ABDOMEN PELVIS W CONTRAST  Result Date: 01/09/2023 CLINICAL DATA:  Psoas abscess, left hip infection, left flank rash EXAM: CT ABDOMEN AND PELVIS WITH CONTRAST TECHNIQUE: Multidetector CT imaging of the abdomen and pelvis was performed using the standard protocol following bolus administration of intravenous contrast. RADIATION DOSE REDUCTION: This exam was performed according to the departmental dose-optimization program which includes automated exposure control, adjustment of the mA and/or kV according to patient size and/or use of  iterative reconstruction technique. CONTRAST:  OMNIPAQUE IOHEXOL 300 MG/ML  SOLN COMPARISON:  01/21/2022 FINDINGS: Lower chest: No acute abnormality. Hepatobiliary: 13 cm lobulated mass essentially replacing the lateral segment the left hepatic lobe demonstrates nodular peripheral enhancement with gradual fill-in on delayed images and is in keeping with a benign cavernous hemangioma. The liver is otherwise unremarkable. No intra or extrahepatic biliary ductal dilation. Cholelithiasis noted without pericholecystic inflammatory change. Pancreas: Unremarkable Spleen: Unremarkable Adrenals/Urinary Tract: The adrenal glands are unremarkable. The kidneys are normal in size and position. Nonobstructing renal calculi are noted within the upper poles of the kidneys bilaterally measuring up to 11 mm. No ureteral calculi. No hydronephrosis. Normal renal cortical enhancement. The bladder is unremarkable. Stomach/Bowel: Severe pancolonic diverticulosis without superimposed acute inflammatory change. Large volume stool within the rectal vault. Stomach, small bowel, and large bowel are otherwise unremarkable. No obstruction. Appendix normal. No free intraperitoneal gas or fluid. Vascular/Lymphatic: Inferior vena cava filter in expected position within the infrarenal cava. Mild aortoiliac atherosclerotic calcification. No aortic aneurysm. No pathologic adenopathy within the abdomen and pelvis. Reproductive: Status post hysterectomy. No adnexal masses. Other: There is asymmetric subcutaneous edema noted within the left flank and visualized lateral left hip which may represent asymmetric, possibly positional, edema or a superimposed inflammatory change as can be seen with cellulitis. No loculated subcutaneous fluid collection is identified. No retroperitoneal fluid collection or hematoma identified. The iliopsoas musculature is unremarkable bilaterally. Musculoskeletal: Right total hip arthroplasty has been performed.  Degenerative changes are seen within the lumbar spine and left hip. No acute bone abnormality. IMPRESSION: 1. Asymmetric subcutaneous edema within the left flank and visualized lateral left hip which may represent asymmetric, possibly positional, edema or a superimposed inflammatory change as can be seen with cellulitis. No loculated subcutaneous fluid collection is identified. No retroperitoneal fluid collection or hematoma identified. 2. Severe pancolonic diverticulosis without superimposed acute inflammatory change. 3. Cholelithiasis. 4. Bilateral nonobstructing renal calculi. 5. 13 cm benign cavernous hemangioma within the left hepatic lobe. Aortic Atherosclerosis (ICD10-I70.0). Electronically Signed   By: Helyn Numbers M.D.   On: 01/09/2023 20:00   (Echo, Carotid, EGD, Colonoscopy, ERCP)    Subjective: No complaints wants go home.  Discharge Exam: Vitals:   01/13/23 0532 01/13/23 0820  BP: (!) 127/55   Pulse: 85   Resp: 18   Temp: 98.3 F (36.8 C)   SpO2: 100% 97%   Vitals:   01/12/23 1927 01/12/23 1939 01/13/23 0532 01/13/23 0820  BP: (!) 126/43  (!) 127/55   Pulse: 76  85   Resp: 18  18   Temp: 99.7 F (37.6 C)  98.3 F (36.8 C)   TempSrc:      SpO2: 98% 98% 100% 97%  Weight:      Height:        General: Pt is alert, awake, not in acute distress Cardiovascular: RRR, S1/S2 +, no rubs, no gallops Respiratory: CTA bilaterally, no wheezing, no rhonchi Abdominal: Soft, NT, ND, bowel sounds + Extremities: no edema, no cyanosis    The results of significant diagnostics from this hospitalization (including imaging, microbiology, ancillary and laboratory) are listed below for reference.     Microbiology: Recent Results (from the past 240 hour(s))  Blood culture (routine x 2)     Status: None (Preliminary result)   Collection Time: 01/09/23  5:28 PM   Specimen: BLOOD RIGHT FOREARM  Result Value Ref Range Status   Specimen Description   Final    BLOOD RIGHT  FOREARM Performed at [facility], [address], [place], [place] [address]    Special Requests   Final    BOTTLES DRAWN AEROBIC AND ANAEROBIC Blood Culture results may not be optimal due to an inadequate volume of blood received in culture bottles Performed at [facility], [address], [place], [place] [address]    Culture   Final    NO GROWTH 4 DAYS Performed at [facility], [address], [place], [place] [address]    Report Status PENDING  Incomplete  Blood culture (routine x 2)     Status: None (Preliminary result)   Collection Time: 01/09/23  5:32 PM   Specimen: BLOOD  Result Value Ref Range Status   Specimen Description   Final    BLOOD RIGHT ANTECUBITAL Performed at [facility], [address], [place], [place] [address]    Special Requests   Final    BOTTLES DRAWN AEROBIC AND ANAEROBIC Blood Culture results may not be optimal due to an inadequate volume of blood received in culture bottles Performed at [facility], [address] [place], [place] [address]    Culture   Final    NO GROWTH 4 DAYS Performed at [facility], [address] [place], [place] [address]    Report Status PENDING  Incomplete     Labs: BNP (last 3 results) No results for input(s): "BNP" in the last 8760 hours. Basic Metabolic Panel: Recent Labs  Lab 01/09/23 1623 01/10/23 0531  NA 141 137  K 3.6 3.8  CL 109 109  CO2 21* 23  GLUCOSE 141* 135*  BUN 11 11  CREATININE 0.86 0.81  CALCIUM 7.8* 7.8*   Liver Function Tests: Recent Labs  Lab 01/11/23 1155  AST 24  ALT 16  ALKPHOS 61  BILITOT 0.5  PROT 6.3*  ALBUMIN 2.4*   No results for input(s): "LIPASE", "AMYLASE" in the last 168 hours. No results for input(s): "AMMONIA" in the last 168 hours. CBC: Recent Labs  Lab 01/09/23 1623 01/10/23 0531 01/11/23 0548  WBC 14.8* 9.8 8.7  NEUTROABS 12.5*  --   --   HGB 12.7 11.1* 12.5  HCT 41.3 35.2*  40.7  MCV 92.6 91.9 90.0  PLT 195 194 168   Cardiac Enzymes: No results for input(s): "CKTOTAL", "CKMB", "CKMBINDEX", "TROPONINI" in the last 168 hours. BNP: Invalid input(s): "POCBNP" CBG: No results for input(s): "GLUCAP" in the last 168 hours. D-Dimer No results for input(s): "DDIMER" in the last 72 hours. Hgb A1c No  results for input(s): "HGBA1C" in the last 72 hours. Lipid Profile No results for input(s): "CHOL", "HDL", "LDLCALC", "TRIG", "CHOLHDL", "LDLDIRECT" in the last 72 hours. Thyroid function studies No results for input(s): "TSH", "T4TOTAL", "T3FREE", "THYROIDAB" in the last 72 hours.  Invalid input(s): "FREET3" Anemia work up No results for input(s): "VITAMINB12", "FOLATE", "FERRITIN", "TIBC", "IRON", "RETICCTPCT" in the last 72 hours. Urinalysis    Component Value Date/Time   COLORURINE YELLOW 01/21/2022 1854   APPEARANCEUR HAZY (A) 01/21/2022 1854   LABSPEC 1.021 01/21/2022 1854   PHURINE 5.0 01/21/2022 1854   GLUCOSEU NEGATIVE 01/21/2022 1854   HGBUR LARGE (A) 01/21/2022 1854   BILIRUBINUR NEGATIVE 01/21/2022 1854   KETONESUR NEGATIVE 01/21/2022 1854   PROTEINUR NEGATIVE 01/21/2022 1854   UROBILINOGEN 1.0 11/07/2009 2127   NITRITE NEGATIVE 01/21/2022 1854   LEUKOCYTESUR TRACE (A) 01/21/2022 1854   Sepsis Labs Recent Labs  Lab 01/09/23 1623 01/10/23 0531 01/11/23 0548  WBC 14.8* 9.8 8.7   Microbiology Recent Results (from the past 240 hour(s))  Blood culture (routine x 2)     Status: None (Preliminary result)   Collection Time: 01/09/23  5:28 PM   Specimen: BLOOD RIGHT FOREARM  Result Value Ref Range Status   Specimen Description   Final    BLOOD RIGHT FOREARM Performed at [facility], [address], [place], [place] [address]    Special Requests   Final    BOTTLES DRAWN AEROBIC AND ANAEROBIC Blood Culture results may not be optimal due to an inadequate volume of blood received in culture bottles Performed at [facility], [address] [place], [place] [address]    Culture   Final    NO GROWTH 4 DAYS Performed at [facility], [address] [place], [place] [address]    Report Status PENDING  Incomplete  Blood culture (routine x 2)     Status: None (Preliminary result)   Collection Time: 01/09/23  5:32 PM   Specimen: BLOOD  Result Value Ref Range Status   Specimen Description   Final    BLOOD RIGHT ANTECUBITAL Performed at [facility], [address] [place], [place] [address]    Special Requests   Final    BOTTLES DRAWN AEROBIC AND ANAEROBIC Blood Culture results may not be optimal due to an inadequate volume of blood received in culture bottles Performed at [facility], [address], [place], [place] [address]    Culture   Final    NO GROWTH 4 DAYS Performed at [facility], [address] [place], [place] [address]    Report Status PENDING  Incomplete     Time coordinating discharge: Over 35 minutes  SIGNED:   Marinda Elk, MD  [facility] 01/13/2023, 9:01 AM Pager   If 7PM-7AM, please contact night-coverage [email or web address] Password [email or web address]

## 2023-01-14 LAB — CULTURE, BLOOD (ROUTINE X 2)
Culture: NO GROWTH
Culture: NO GROWTH

## 2023-02-23 ENCOUNTER — Encounter: Payer: Self-pay | Admitting: Podiatry

## 2023-02-23 ENCOUNTER — Ambulatory Visit (INDEPENDENT_AMBULATORY_CARE_PROVIDER_SITE_OTHER): Payer: 59 | Admitting: Podiatry

## 2023-02-23 DIAGNOSIS — M79675 Pain in left toe(s): Secondary | ICD-10-CM | POA: Diagnosis not present

## 2023-02-23 DIAGNOSIS — M79674 Pain in right toe(s): Secondary | ICD-10-CM | POA: Diagnosis not present

## 2023-02-23 DIAGNOSIS — B351 Tinea unguium: Secondary | ICD-10-CM

## 2023-02-23 NOTE — Progress Notes (Signed)
  Subjective:  Patient ID: [name], female    DOB: 03/01/1929,  MRN: [id number]  Chief Complaint  Patient presents with   Ingrown Toenail    PATIENT STATES SHE HAS SOME IN GROWN TOE NAIL ON THE RF THE HALLUX AND STATES SHE NEED HER TOE NAILS ALSO CUT DOWN . NO MEDICATION FOR PAIN ,    89 y.o. female presents with the above complaint. History confirmed with patient.   Objective:  Physical Exam: warm, good capillary refill, no trophic changes or ulcerative lesions, normal DP and PT pulses, and normal sensory exam.  Pincer nail deformity without paronychia of the left first medial right first medial and right second medial toes Left Foot: dystrophic yellowed discolored nail plates with subungual debris Right Foot: dystrophic yellowed discolored nail plates with subungual debris  Assessment:   1. Pain due to onychomycosis of toenails of both feet      Plan:  Patient was evaluated and treated and all questions answered.  Discussed the etiology and treatment options for the condition in detail with the patient. Recommended debridement of the nails today. Sharp and mechanical debridement performed of all painful and mycotic nails today. Nails debrided in length and thickness using a nail nipper to level of comfort. Discussed treatment options including appropriate shoe gear. Follow up as needed for painful nails.  Pincer nail areas were debrided and slant back fashion to alleviate this.  Neosporin and Band-Aid applied.  They should continue this with some Epsom salt soaks for the next week.    Return in about 3 months (around 05/26/2023) for painful thick fungal nails.

## 2023-04-18 ENCOUNTER — Other Ambulatory Visit: Payer: Self-pay

## 2023-04-18 ENCOUNTER — Encounter (HOSPITAL_COMMUNITY): Payer: Self-pay

## 2023-04-18 ENCOUNTER — Emergency Department (HOSPITAL_COMMUNITY): Payer: 59

## 2023-04-18 ENCOUNTER — Observation Stay (HOSPITAL_COMMUNITY)
Admission: EM | Admit: 2023-04-18 | Discharge: 2023-04-20 | Disposition: A | Discharge status: Home / Self Care | Payer: 59 | Attending: Internal Medicine | Admitting: Internal Medicine

## 2023-04-18 DIAGNOSIS — G9340 Encephalopathy, unspecified: Principal | ICD-10-CM | POA: Diagnosis present

## 2023-04-18 DIAGNOSIS — E7800 Pure hypercholesterolemia, unspecified: Secondary | ICD-10-CM | POA: Insufficient documentation

## 2023-04-18 DIAGNOSIS — R2990 Unspecified symptoms and signs involving the nervous system: Secondary | ICD-10-CM | POA: Diagnosis not present

## 2023-04-18 DIAGNOSIS — Z96641 Presence of right artificial hip joint: Secondary | ICD-10-CM | POA: Diagnosis not present

## 2023-04-18 DIAGNOSIS — J449 Chronic obstructive pulmonary disease, unspecified: Secondary | ICD-10-CM | POA: Insufficient documentation

## 2023-04-18 DIAGNOSIS — Z79899 Other long term (current) drug therapy: Secondary | ICD-10-CM | POA: Diagnosis not present

## 2023-04-18 DIAGNOSIS — G9341 Metabolic encephalopathy: Secondary | ICD-10-CM | POA: Diagnosis present

## 2023-04-18 DIAGNOSIS — R29818 Other symptoms and signs involving the nervous system: Secondary | ICD-10-CM | POA: Diagnosis present

## 2023-04-18 DIAGNOSIS — F17210 Nicotine dependence, cigarettes, uncomplicated: Secondary | ICD-10-CM | POA: Diagnosis not present

## 2023-04-18 DIAGNOSIS — Z86718 Personal history of other venous thrombosis and embolism: Secondary | ICD-10-CM | POA: Diagnosis not present

## 2023-04-18 DIAGNOSIS — Z7901 Long term (current) use of anticoagulants: Secondary | ICD-10-CM | POA: Diagnosis not present

## 2023-04-18 DIAGNOSIS — I10 Essential (primary) hypertension: Secondary | ICD-10-CM | POA: Diagnosis present

## 2023-04-18 DIAGNOSIS — N179 Acute kidney failure, unspecified: Principal | ICD-10-CM | POA: Insufficient documentation

## 2023-04-18 DIAGNOSIS — R4781 Slurred speech: Secondary | ICD-10-CM

## 2023-04-18 HISTORY — DX: Cerebral infarction, unspecified: I63.9

## 2023-04-18 LAB — I-STAT CHEM 8, ED
BUN: 22 mg/dL (ref 8–23)
Calcium, Ion: 1.16 mmol/L (ref 1.15–1.40)
Chloride: 102 mmol/L (ref 98–111)
Creatinine, Ser: 1.6 mg/dL — ABNORMAL HIGH (ref 0.44–1.00)
Glucose, Bld: 126 mg/dL — ABNORMAL HIGH (ref 70–99)
HCT: 30 % — ABNORMAL LOW (ref 36.0–46.0)
Hemoglobin: 10.2 g/dL — ABNORMAL LOW (ref 12.0–15.0)
Potassium: 4.1 mmol/L (ref 3.5–5.1)
Sodium: 137 mmol/L (ref 135–145)
TCO2: 24 mmol/L (ref 22–32)

## 2023-04-18 LAB — DIFFERENTIAL
Abs Immature Granulocytes: 0.05 10*3/uL (ref 0.00–0.07)
Basophils Absolute: 0 10*3/uL (ref 0.0–0.1)
Basophils Relative: 0 %
Eosinophils Absolute: 0.3 10*3/uL (ref 0.0–0.5)
Eosinophils Relative: 3 %
Immature Granulocytes: 1 %
Lymphocytes Relative: 22 %
Lymphs Abs: 2.2 10*3/uL (ref 0.7–4.0)
Monocytes Absolute: 1 10*3/uL (ref 0.1–1.0)
Monocytes Relative: 10 %
Neutro Abs: 6.2 10*3/uL (ref 1.7–7.7)
Neutrophils Relative %: 64 %

## 2023-04-18 LAB — COMPREHENSIVE METABOLIC PANEL
ALT: 13 U/L (ref 0–44)
AST: 23 U/L (ref 15–41)
Albumin: 2.1 g/dL — ABNORMAL LOW (ref 3.5–5.0)
Alkaline Phosphatase: 55 U/L (ref 38–126)
Anion gap: 7 (ref 5–15)
BUN: 22 mg/dL (ref 8–23)
CO2: 24 mmol/L (ref 22–32)
Calcium: 8.5 mg/dL — ABNORMAL LOW (ref 8.9–10.3)
Chloride: 107 mmol/L (ref 98–111)
Creatinine, Ser: 1.6 mg/dL — ABNORMAL HIGH (ref 0.44–1.00)
GFR, Estimated: 30 mL/min — ABNORMAL LOW (ref >60)
Glucose, Bld: 131 mg/dL — ABNORMAL HIGH (ref 70–99)
Potassium: 4.1 mmol/L (ref 3.5–5.1)
Sodium: 138 mmol/L (ref 135–145)
Total Bilirubin: 0.5 mg/dL (ref 0.0–1.2)
Total Protein: 6 g/dL — ABNORMAL LOW (ref 6.5–8.1)

## 2023-04-18 LAB — CBC
HCT: 31.7 % — ABNORMAL LOW (ref 36.0–46.0)
Hemoglobin: 10 g/dL — ABNORMAL LOW (ref 12.0–15.0)
MCH: 28.4 pg (ref 26.0–34.0)
MCHC: 31.5 g/dL (ref 30.0–36.0)
MCV: 90.1 fL (ref 80.0–100.0)
Platelets: 181 10*3/uL (ref 150–400)
RBC: 3.52 MIL/uL — ABNORMAL LOW (ref 3.87–5.11)
RDW: 17.7 % — ABNORMAL HIGH (ref 11.5–15.5)
WBC: 9.8 10*3/uL (ref 4.0–10.5)
nRBC: 0 % (ref 0.0–0.2)

## 2023-04-18 LAB — APTT: aPTT: 37 s — ABNORMAL HIGH (ref 24–36)

## 2023-04-18 LAB — PROTIME-INR
INR: 1.5 — ABNORMAL HIGH (ref 0.8–1.2)
Prothrombin Time: 18.6 s — ABNORMAL HIGH (ref 11.4–15.2)

## 2023-04-18 LAB — ETHANOL: Alcohol, Ethyl (B): <10 mg/dL (ref <10)

## 2023-04-18 LAB — CBG MONITORING, ED: Glucose-Capillary: 118 mg/dL — ABNORMAL HIGH (ref 70–99)

## 2023-04-18 MED ORDER — SODIUM CHLORIDE 0.9 % IV BOLUS
1000.0000 mL | Freq: Once | INTRAVENOUS | Status: AC
  Administered 2023-04-19: 1000 mL via INTRAVENOUS

## 2023-04-18 NOTE — Consult Note (Addendum)
 NEUROLOGY CONSULT NOTE   Date of service: April 18, 2023 Patient Name: [name] MRN:  [id number] DOB:  Sep 06, 1928 Chief Complaint: Slurred speech, slow speech, worsening left-sided weakness from baseline Requesting Provider: Pamella Ozell LABOR, DO  History of Present Illness  [name] is a 89 y.o. 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  No missed doses of Eliquis  per caregiver report to EMS.  No reported concern for seizure-like activity to EMS  Per EMS symptoms improving during transport although she continued to have unequal pupils (which have typically been documented as equal however on my own last exam of the patient she had similar pupillary response right 1.5 mm to 1 mm, left 4 mm to 2 mm)  I attempted to reach to daughter for collateral but the number went to voicemail x2 attempts  LKW: Possibly 9:30 PM, did not determine definitively given patient not a candidate for intervention Modified rankin score: 4-5 IV Thrombolysis: No, eliquis   EVT: No, baseline MRS does not support risk/benefit profile  ICH Score: 0   NIHSS components Score: Comment  1a Level of Conscious 0[x]  1[]  2[]  3[]      1b LOC Questions 0[x]  1[]  2[]       1c LOC Commands 0[x]  1[]  2[]       2 Best Gaze 0[x]  1[]  2[]       3 Visual 0[x]  1[]  2[]  3[]      4 Facial Palsy 0[x]  1[]  2[]  3[]      5a Motor Arm - left 0[]  1[]  2[]  3[x]  4[]  UN[]    5b Motor Arm - Right 0[x]  1[]  2[]  3[]  4[]  UN[]    6a Motor Leg - Left 0[]  1[]  2[]  3[x]  4[]  UN[]    6b Motor Leg - Right 0[]  1[x]  2[]  3[]  4[]  UN[]    7 Limb Ataxia 0[x]   1[]  2[]  3[]  UN[]     8 Sensory 0[x]  1[]  2[]  UN[]      9 Best Language 0[x]  1[]  2[]  3[]      10 Dysarthria 0[]  1[x]  2[]  UN[]      11 Extinct. and Inattention 0[x]  1[]  2[]       TOTAL: 8       ROS  Limited review of systems in the emergent setting  Past History   Past Medical History:  Diagnosis Date   Allergic rhinitis    Arthritis    Carpal tunnel syndrome    Deep venous thrombosis (HCC)    bilateral legs   Degenerative arthritis    right knee   Hypertension    Lumbar degenerative disc disease    Lump or mass in breast    Paresthesia    Syncope 05/24/2013   Vitamin D deficiency     Past Surgical History:  Procedure Laterality Date   ABDOMINAL HYSTERECTOMY     CARPAL TUNNEL RELEASE Right    CATARACT EXTRACTION Bilateral    HEMORROIDECTOMY     IVC Filter     TOTAL HIP ARTHROPLASTY Right    ULNAR NERVE TRANSPOSITION Right     Family History: Family History  Problem Relation Age of Onset   Diabetes Mother    Kidney failure Father    Cancer -  Lung Brother    Cancer - Prostate Brother    Kidney failure Son     Social History  reports that she has been smoking cigarettes. She has a 300 pack-year smoking history. She has never been exposed to tobacco smoke. She has never used smokeless tobacco. She reports current alcohol use. She reports that she does not use drugs.  Allergies  Allergen Reactions   Codeine Other (See Comments)    Passed out   Penicillins Other (See Comments)    Passed out Has patient had a PCN reaction causing immediate rash, facial/tongue/throat swelling, SOB or lightheadedness with hypotension: Yes Has patient had a PCN reaction causing severe rash involving mucus membranes or skin necrosis: Unk Has patient had a PCN reaction that required hospitalization: No Has patient had a PCN reaction occurring within the last 10 years: No If all of the above answers are NO, then may proceed with Cephalosporin use.     Medications  No current  facility-administered medications for this encounter.  Current Outpatient Medications:    albuterol  (PROVENTIL ) (2.5 MG/3ML) 0.083% nebulizer solution, Take 2.5 mg by nebulization every 6 (six) hours as needed., Disp: , Rfl:    albuterol  (VENTOLIN  HFA) 108 (90 Base) MCG/ACT inhaler, Inhale 1-2 puffs into the lungs every 6 (six) hours as needed for wheezing or shortness of breath., Disp: , Rfl:    apixaban  (ELIQUIS ) 5 MG TABS tablet, Take 5 mg by mouth 2 (two) times daily., Disp: , Rfl:    atorvastatin  (LIPITOR) 20 MG tablet, Take 1 tablet (20 mg total) by mouth daily. (Patient taking differently: Take 20 mg by mouth at bedtime.), Disp: 30 tablet, Rfl: 3   diclofenac  sodium (VOLTAREN ) 1 % GEL, Apply 1 application topically 4 (four) times daily as needed (knee and hand pain)., Disp: , Rfl:    escitalopram  (LEXAPRO ) 10 MG tablet, Take 10 mg by mouth every evening., Disp: , Rfl:    furosemide (LASIX) 20 MG tablet, Take 20 mg by mouth daily as needed., Disp: , Rfl:    hydrOXYzine  (ATARAX ) 10 MG tablet, Take 10 mg by mouth every 6 (six) hours as needed., Disp: , Rfl:    lactobacillus acidophilus (BACID) TABS tablet, Take 1 tablet by mouth daily., Disp: , Rfl:    loperamide  (IMODIUM ) 2 MG capsule, Take 1 capsule (2 mg total) by mouth as needed for diarrhea or loose stools., Disp: 30 capsule, Rfl: 0   losartan  (COZAAR ) 50 MG tablet, Take 50 mg by mouth daily., Disp: , Rfl:    meclizine  (ANTIVERT ) 25 MG tablet, Take 25 mg by mouth 3 (three) times daily as needed for dizziness. As needed for vertigo, Disp: , Rfl:    NOREL AD 4-10-325 MG TABS, Take 1 tablet by mouth 2 (two) times daily as needed., Disp: , Rfl:    nystatin  (MYCOSTATIN /NYSTOP ) powder, Apply 1 Application topically., Disp: , Rfl:    pregabalin  (LYRICA ) 100 MG capsule, Take 100 mg by mouth 3 (three) times daily., Disp: , Rfl:    zinc  oxide 20 % ointment, Apply 1 Application topically 2 (two) times daily as needed for irritation., Disp: , Rfl:    Vitals   Vitals:   04/18/23 2200  Weight: 130.2 kg    Body mass index is 43.64 kg/m.  Physical Exam   Constitutional: Appears chronically ill Psych: Affect pleasant and cooperative Eyes: No scleral injection.  HENT: No OP obstruction.  Head: Normocephalic.  Cardiovascular: Normal rate and regular rhythm.  Respiratory: Effort normal, non-labored  breathing GI: Soft.  No distension. There is no tenderness.    Neurologic Examination   Neuro: Mental Status: Patient is awake, alert, oriented to person, place, month, year, but unclear on details of the situation leading to her presentation No signs of aphasia or neglect Cranial Nerves: II: Visual Fields are full.  Anisocoria similar to previously documented: Right 1.5 mm to 1 mm, left 4 mm to 2 mm  III,IV, VI: EOMI without ptosis or diploplia.  V: Facial sensation is symmetric to light touch VII: Facial movement is symmetric at the time of my evaluation.  VIII: hearing is intact to voice XII: tongue is midline without atrophy or fasciculations.  Motor: Baseline significant left hemiparesis.  She does have trace movement of the arm and leg.  No drift of the right upper extremity, slight drift of the right lower extremity Sensory: Sensation is symmetric to light touch and temperature in the arms and legs. Cerebellar: Finger-nose intact in the right upper extremity, unable to test on the left side due to hemiparesis    Labs/Imaging/Neurodiagnostic studies   CBC:  Recent Labs  Lab May 02, 2023 2241 2023/05/02 2247  WBC 9.8  --   NEUTROABS 6.2  --   HGB 10.0* 10.2*  HCT 31.7* 30.0*  MCV 90.1  --   PLT 181  --    Basic Metabolic Panel:  Lab Results  Component Value Date   NA 137 01/10/2023   K 3.8 01/10/2023   CO2 23 01/10/2023   GLUCOSE 135 (H) 01/10/2023   BUN 11 01/10/2023   CREATININE 0.81 01/10/2023   CALCIUM  7.8 (L) 01/10/2023   GFRNONAA >60 01/10/2023   GFRAA 64 05/21/2020   Lipid Panel:  Lab Results   Component Value Date   LDLCALC 53 01/07/2021   HgbA1c:  Lab Results  Component Value Date   HGBA1C 5.7 (H) 01/07/2021   Urine Drug Screen:     Component Value Date/Time   LABOPIA NONE DETECTED 01/25/2021 2024   COCAINSCRNUR NONE DETECTED 01/25/2021 2024   LABBENZ NONE DETECTED 01/25/2021 2024   AMPHETMU NONE DETECTED 01/25/2021 2024   THCU NONE DETECTED 01/25/2021 2024   LABBARB NONE DETECTED 01/25/2021 2024    Alcohol Level     Component Value Date/Time   ETH <10 01/25/2021 1501   INR  Lab Results  Component Value Date   INR 1.8 (H) 01/10/2023   APTT  Lab Results  Component Value Date   APTT 34 01/10/2023    CT Head without contrast(Personally reviewed): no acute intracranial process   MRI Brain: pending   ASSESSMENT   XOEY WARMOTH is a 89 y.o. presenting with transient worsening of her left-sided symptoms.  Certainly given her known severe right MCA stenosis and relatively low blood pressures this could be hypoperfusion secondary to that stenosis.  However from a stroke perspective she is maximized medically on Eliquis .  Does need MRI brain to confirm safety of continuing Eliquis   RECOMMENDATIONS  -MRI brain without contrast -Permissive hypertension to 180/105 due to Eliquis  on board until MRI brain completed -Every 2 neurochecks until MRI brain completed -If this is negative, continue Eliquis  and continue outpatient follow-up from a neurologic perspective.  -Inpatient neurology will follow-up MRI brain but otherwise will sign off at this time.  Please do notify us  of any additional neurological questions or concerns that arise -Appreciate medical clearance per ED physician  Addendum, MRI brain negative.  Labs with AKI with ED notes indicating recent initiation of Lasix that may  be contributing and plan for admission to monitor for full return to baseline (per notes patient already appears to be markedly improving).   Favor hypoperfusion/recrudescence  secondary to hypotension/hypovolemia/AKI  Neurology will sign off at this time, please reach out if additional questions or concerns arise ______________________________________________________________________    Bonney Lola Jernigan MD-PhD [facility] [phone]   CRITICAL CARE Performed by: Lola LITTIE Jernigan   Total critical care time: 35 minutes  Critical care time was exclusive of separately billable procedures and treating other patients.  Critical care was necessary to treat or prevent imminent or life-threatening deterioration.  Critical care was time spent personally by me on the following activities: development of treatment plan with patient and/or surrogate as well as nursing, discussions with consultants, evaluation of patient's response to treatment, examination of patient, obtaining history from patient or surrogate, ordering and performing treatments and interventions, ordering and review of laboratory studies, ordering and review of radiographic studies, pulse oximetry and re-evaluation of patient's condition.

## 2023-04-18 NOTE — Code Documentation (Signed)
 Responded to Code Stroke called at 2219 for L facial droop, slurred speech, and confusion, [id number]. Pt arrived at 2239, CBG-118, NIH-8, CT head negative for acute changes. TNK not given-pt on eliquis . Plan MRI. Please complete VS/neuro checks q2h x 12h, then q4h.

## 2023-04-18 NOTE — ED Provider Notes (Signed)
 [place] EMERGENCY DEPARTMENT AT [facility] Provider Note   CSN: [id number] Arrival date & time: 04/18/23  2239  An emergency department physician performed an initial assessment on this suspected stroke patient at 2239.  History  Chief Complaint  Patient presents with   Code Stroke    [name] is a 89 y.o. female.  The history is provided by the patient and medical records.   89 year old female with history of prior stroke and residual left-sided deficits, anemia, hypertension, presenting to the ED as a code stroke.  Patient's daughter is primary caregiver but while working today home health nurse was with patient.  States she had some low BP readings this morning but did seem to improve throughout the day.  She called her later this evening and noted that patient was slurring her words a bit and seemed like she was dozing off frequently.  EMS noted facial droop and left sided weakness (however they were unaware this is chronic).  No reported seizure activity.  Patient currently AAOx3 while talking with me during exam-- able to recall paramedics picking her up, carrying her out of the house, etc.    Home Medications Prior to Admission medications   Medication Sig Start Date End Date Taking? Authorizing Provider  albuterol  (PROVENTIL ) (2.5 MG/3ML) 0.083% nebulizer solution Take 2.5 mg by nebulization every 6 (six) hours as needed. 08/11/22   [provider]  albuterol  (VENTOLIN  HFA) 108 (90 Base) MCG/ACT inhaler Inhale 1-2 puffs into the lungs every 6 (six) hours as needed for wheezing or shortness of breath.    [provider]  apixaban  (ELIQUIS ) 5 MG TABS tablet Take 5 mg by mouth 2 (two) times daily.    [provider]  atorvastatin  (LIPITOR) 20 MG tablet Take 1 tablet (20 mg total) by mouth daily. Patient taking differently: Take 20 mg by mouth at bedtime. 01/07/21   Inga Earnie GRADE, PA-C  diclofenac  sodium (VOLTAREN ) 1 % GEL Apply 1  application topically 4 (four) times daily as needed (knee and hand pain).    [provider]  escitalopram  (LEXAPRO ) 10 MG tablet Take 10 mg by mouth every evening. 05/25/21   [provider]  furosemide (LASIX) 20 MG tablet Take 20 mg by mouth daily as needed. 01/23/23   [provider]  hydrOXYzine  (ATARAX ) 10 MG tablet Take 10 mg by mouth every 6 (six) hours as needed. 12/18/22   [provider]  lactobacillus acidophilus (BACID) TABS tablet Take 1 tablet by mouth daily.    [provider]  loperamide  (IMODIUM ) 2 MG capsule Take 1 capsule (2 mg total) by mouth as needed for diarrhea or loose stools. 01/24/22   Christobal Guadalajara, MD  losartan  (COZAAR ) 50 MG tablet Take 50 mg by mouth daily.    [provider]  meclizine  (ANTIVERT ) 25 MG tablet Take 25 mg by mouth 3 (three) times daily as needed for dizziness. As needed for vertigo    [provider]  NOREL AD 4-10-325 MG TABS Take 1 tablet by mouth 2 (two) times daily as needed. 12/05/22   [provider]  nystatin  (MYCOSTATIN /NYSTOP ) powder Apply 1 Application topically. 02/22/23   [provider]  pregabalin  (LYRICA ) 100 MG capsule Take 100 mg by mouth 3 (three) times daily. 05/31/21   [provider]  zinc  oxide 20 % ointment Apply 1 Application topically 2 (two) times daily as needed for irritation. 12/04/22   [provider]  Allergies    Codeine and Penicillins    Review of Systems   Review of Systems  Neurological:  Positive for facial asymmetry.  All other systems reviewed and are negative.   Physical Exam Updated Vital Signs BP (!) 105/48   Pulse (!) 51   Temp (!) 97.5 F (36.4 C) (Oral)   Resp (!) 22   Ht 5' 9 (1.753 m)   Wt 123.4 kg   SpO2 98%   BMI 40.17 kg/m   Physical Exam Vitals and nursing note reviewed.  Constitutional:      Appearance: She is well-developed.  HENT:     Head: Normocephalic and atraumatic.  Eyes:      Conjunctiva/sclera: Conjunctivae normal.     Pupils: Pupils are equal, round, and reactive to light.  Cardiovascular:     Rate and Rhythm: Normal rate and regular rhythm.     Heart sounds: Normal heart sounds.  Pulmonary:     Effort: Pulmonary effort is normal. No respiratory distress.     Breath sounds: Normal breath sounds. No rhonchi.  Abdominal:     General: Bowel sounds are normal.     Palpations: Abdomen is soft.  Musculoskeletal:        General: Normal range of motion.     Cervical back: Normal range of motion.  Skin:    General: Skin is warm and dry.  Neurological:     Mental Status: She is alert and oriented to person, place, and time.     Comments: AAOx3, able to answer questions and follow commands, able to give description of EMS paramedics that picked her up, left-sided weakness that is chronic, normal movement of right upper and lower extremities     ED Results / Procedures / Treatments   Labs (all labs ordered are listed, but only abnormal results are displayed) Labs Reviewed  PROTIME-INR - Abnormal; Notable for the following components:      Result Value   Prothrombin Time 18.6 (*)    INR 1.5 (*)    All other components within normal limits  APTT - Abnormal; Notable for the following components:   aPTT 37 (*)    All other components within normal limits  CBC - Abnormal; Notable for the following components:   RBC 3.52 (*)    Hemoglobin 10.0 (*)    HCT 31.7 (*)    RDW 17.7 (*)    All other components within normal limits  I-STAT CHEM 8, ED - Abnormal; Notable for the following components:   Creatinine, Ser 1.60 (*)    Glucose, Bld 126 (*)    Hemoglobin 10.2 (*)    HCT 30.0 (*)    All other components within normal limits  CBG MONITORING, ED - Abnormal; Notable for the following components:   Glucose-Capillary 118 (*)    All other components within normal limits  DIFFERENTIAL  ETHANOL  COMPREHENSIVE METABOLIC PANEL  RAPID URINE DRUG SCREEN, HOSP  PERFORMED  URINALYSIS, ROUTINE W REFLEX MICROSCOPIC    EKG None  Radiology CT HEAD CODE STROKE WO CONTRAST Result Date: 04/18/2023 CLINICAL DATA:  Code stroke.  Slurred speech and facial droop EXAM: CT HEAD WITHOUT CONTRAST TECHNIQUE: Contiguous axial images were obtained from the base of the skull through the vertex without intravenous contrast. RADIATION DOSE REDUCTION: This exam was performed according to the departmental dose-optimization program which includes automated exposure control, adjustment of the mA and/or kV according to patient size and/or use of iterative reconstruction technique. COMPARISON:  01/22/2021  FINDINGS: Brain: There is no mass, hemorrhage or extra-axial collection. The size and configuration of the ventricles and extra-axial CSF spaces are normal. There is hypoattenuation of the periventricular white matter, most commonly indicating chronic ischemic microangiopathy. Old right parietal infarct. Vascular: No abnormal hyperdensity of the major intracranial arteries or dural venous sinuses. No intracranial atherosclerosis. Skull: The visualized skull base, calvarium and extracranial soft tissues are normal. Sinuses/Orbits: Bilateral maxillary sinus mucosal thickening. The orbits are normal. ASPECTS [facility] Stroke Program Early CT Score) - Ganglionic level infarction (caudate, lentiform nuclei, internal capsule, insula, M1-M3 cortex): 7 - Supraganglionic infarction (M4-M6 cortex): 3 Total score (0-10 with 10 being normal): 10 IMPRESSION: 1. No acute intracranial abnormality. 2. ASPECTS is 10. 3. Old right parietal infarct and chronic ischemic microangiopathy. These results were communicated to Dr. Lola Jernigan at 10:53 pm on 04/18/2023 by text page via the [facility] messaging system. Electronically Signed   By: Franky Stanford M.D.   On: 04/18/2023 22:54    Procedures Procedures    Medications Ordered in ED Medications - No data to display  ED Course/ Medical Decision Making/  A&P                                 Medical Decision Making Risk Decision regarding hospitalization.   89 year old female presenting to the ED as a code stroke.  Majority of history provided by daughter at bedside who cares for patient.  She was at work today but home health aides called her multiple times indicating that patient was drowsy and somewhat sluggish, slurred speech, and seem to be nodding off frequently.  This is vastly different from her baseline.  EMS activated code stroke based on left-sided weakness, however this is chronic.  Also reported facial droop, daughter reports face appears normal to her.  On my exam patient is awake and alert.  She is able to follow commands.  She does have dense left-sided deficits which are unchanged from baseline.  She is moving right arm and right leg as normal.  I am not appreciating any significant facial droop.  CT head is negative.  Labs do reveal AKI, serum creatinine today is 1.6, baseline is around 0.8.  Daughter does report they have been giving her Lasix over the past few days due to some lower extremity edema which may be contributing along with possibly some dehydration as well.  She is given small fluid bolus.  Neurology recommends MRI for further evaluation.  Not a tpa candidate as she is on eliquis .  As she is not back to baseline per family coupled with AKI, will admit.  Spoke with hospitalist, Dr. Charlton-- will admit for ongoing care.  Final Clinical Impression(s) / ED Diagnoses Final diagnoses:  AKI (acute kidney injury) (HCC)  Slurred speech    Rx / DC Orders ED Discharge Orders     None         Jarold Olam HERO, PA-C 04/19/23 0032    Pamella Ozell LABOR, DO 04/19/23 [phone]

## 2023-04-18 NOTE — ED Triage Notes (Signed)
 Pt arrives via [facility] from home with Code Stroke activation. Pt caregiver observed while changing the pt in bed approx. 2130 tonight that pt had left facial droop, and was difficult to understand d/t slurred speech. Pt has left sided upper and lower extremity flaccidity from x2 previous CVA per pt daughter at bedside. Pt exhibits some mild confusion to details of situation, slow speech with a slowed response when asked questions, mild left facial droop observed while taking pt oral temperature. Pt pupils also unequal with her R pupil being pinpoint at this time, and her left pupil is approx. 2-71mm at this time. Per pt daughter, pt typically has lower blood pressure readings at home. Pt proceeded to CT with stroke team.

## 2023-04-19 ENCOUNTER — Observation Stay (HOSPITAL_COMMUNITY): Payer: 59

## 2023-04-19 ENCOUNTER — Encounter (HOSPITAL_COMMUNITY): Payer: Self-pay | Admitting: Family Medicine

## 2023-04-19 DIAGNOSIS — G9340 Encephalopathy, unspecified: Principal | ICD-10-CM

## 2023-04-19 DIAGNOSIS — G9341 Metabolic encephalopathy: Secondary | ICD-10-CM | POA: Diagnosis present

## 2023-04-19 LAB — GLUCOSE, CAPILLARY: Glucose-Capillary: 190 mg/dL — ABNORMAL HIGH (ref 70–99)

## 2023-04-19 MED ORDER — HYDROXYZINE HCL 10 MG PO TABS: 10.0000 mg | ORAL_TABLET | Freq: Four times a day (QID) | ORAL | Status: DC | PRN

## 2023-04-19 MED ORDER — ACETAMINOPHEN 650 MG RE SUPP
650.0000 mg | Freq: Four times a day (QID) | RECTAL | Status: DC | PRN
Start: 2023-04-19 — End: 2023-04-19

## 2023-04-19 MED ORDER — ONDANSETRON HCL 4 MG/2ML IJ SOLN: 4.0000 mg | Freq: Four times a day (QID) | INTRAMUSCULAR | Status: DC | PRN

## 2023-04-19 MED ORDER — ATORVASTATIN CALCIUM 10 MG PO TABS
20.0000 mg | ORAL_TABLET | Freq: Every day | ORAL | Status: DC
  Administered 2023-04-19 – 2023-04-20 (×2): 20 mg via ORAL
  Filled 2023-04-19 (×2): qty 2

## 2023-04-19 MED ORDER — ALBUTEROL SULFATE (2.5 MG/3ML) 0.083% IN NEBU: 3.0000 mL | INHALATION_SOLUTION | Freq: Four times a day (QID) | RESPIRATORY_TRACT | Status: DC | PRN

## 2023-04-19 MED ORDER — BENZONATATE 100 MG PO CAPS
100.0000 mg | ORAL_CAPSULE | Freq: Two times a day (BID) | ORAL | Status: DC
  Administered 2023-04-19 – 2023-04-20 (×3): 100 mg via ORAL
  Filled 2023-04-19 (×3): qty 1

## 2023-04-19 MED ORDER — ESCITALOPRAM OXALATE 10 MG PO TABS
10.0000 mg | ORAL_TABLET | Freq: Every evening | ORAL | Status: DC
  Administered 2023-04-19: 10 mg via ORAL
  Filled 2023-04-19: qty 1

## 2023-04-19 MED ORDER — APIXABAN 5 MG PO TABS
5.0000 mg | ORAL_TABLET | Freq: Two times a day (BID) | ORAL | Status: DC
  Administered 2023-04-19 – 2023-04-20 (×3): 5 mg via ORAL
  Filled 2023-04-19 (×3): qty 1

## 2023-04-19 MED ORDER — HEPARIN SODIUM (PORCINE) 5000 UNIT/ML IJ SOLN: 5000.0000 [IU] | Freq: Three times a day (TID) | INTRAMUSCULAR | Status: DC

## 2023-04-19 MED ORDER — ACETAMINOPHEN 325 MG PO TABS
650.0000 mg | ORAL_TABLET | Freq: Four times a day (QID) | ORAL | Status: DC | PRN
  Administered 2023-04-19 – 2023-04-20 (×2): 650 mg via ORAL
  Filled 2023-04-19 (×2): qty 2

## 2023-04-19 MED ORDER — ONDANSETRON HCL 4 MG PO TABS: 4.0000 mg | ORAL_TABLET | Freq: Four times a day (QID) | ORAL | Status: DC | PRN

## 2023-04-19 MED ORDER — POLYETHYLENE GLYCOL 3350 17 G PO PACK: 17.0000 g | PACK | Freq: Every day | ORAL | Status: DC | PRN

## 2023-04-19 MED ORDER — SODIUM CHLORIDE 0.9 % IV SOLN: INTRAVENOUS | Status: AC

## 2023-04-19 NOTE — ED Notes (Signed)
 ED TO INPATIENT HANDOFF REPORT  ED Nurse Name and Phone #: [place] RN [phone]  S Name/Age/Gender [name] 89 y.o. female Room/Bed: 026C/026C  Code Status   Code Status: Full Code  Home/SNF/Other Home Patient oriented to: self, place, time, and situation Is this baseline? Yes   Triage Complete: Triage complete  Chief Complaint Acute encephalopathy [G93.40] Acute metabolic encephalopathy [G93.41]  Triage Note Pt arrives via [facility] from home with Code Stroke activation. Pt caregiver observed while changing the pt in bed approx. 2130 tonight that pt had left facial droop, and was difficult to understand d/t slurred speech. Pt has left sided upper and lower extremity flaccidity from x2 previous CVA per pt daughter at bedside. Pt exhibits some mild confusion to details of situation, slow speech with a slowed response when asked questions, mild left facial droop observed while taking pt oral temperature. Pt pupils also unequal with her R pupil being pinpoint at this time, and her left pupil is approx. 2-41mm at this time. Per pt daughter, pt typically has lower blood pressure readings at home. Pt proceeded to CT with stroke team.      Allergies Allergies  Allergen Reactions   Codeine Other (See Comments)    Passed out   Penicillins Other (See Comments)    Passed out Has patient had a PCN reaction causing immediate rash, facial/tongue/throat swelling, SOB or lightheadedness with hypotension: Yes Has patient had a PCN reaction causing severe rash involving mucus membranes or skin necrosis: Unk Has patient had a PCN reaction that required hospitalization: No Has patient had a PCN reaction occurring within the last 10 years: No If all of the above answers are NO, then may proceed with Cephalosporin use.     Level of Care/Admitting Diagnosis ED Disposition     ED Disposition  Admit   Condition  --   Comment  Hospital Area: [facility] [[id number]]   Level of Care: Telemetry Medical [104]  May place patient in observation at [facility] or [facility] if equivalent level of care is available:: Yes  Covid Evaluation: Asymptomatic - no recent exposure (last 10 days) testing not required  Diagnosis: Acute metabolic encephalopathy [[id number]]  Admitting Physician: VERNON VELNA SAUNDERS [[id number]]  Attending Physician: VERNON VELNA SAUNDERS [phone]          B Medical/Surgery History Past Medical History:  Diagnosis Date   Allergic rhinitis    Arthritis    Carpal tunnel syndrome    CVA (cerebral vascular accident) (HCC)    Deep venous thrombosis (HCC)    bilateral legs   Degenerative arthritis    right knee   Hypertension    Lumbar degenerative disc disease    Lump or mass in breast    Paresthesia    Syncope 05/24/2013   Vitamin D deficiency    Past Surgical History:  Procedure Laterality Date   ABDOMINAL HYSTERECTOMY     CARPAL TUNNEL RELEASE Right    CATARACT EXTRACTION Bilateral    HEMORROIDECTOMY     IVC Filter     TOTAL HIP ARTHROPLASTY Right    ULNAR NERVE TRANSPOSITION Right      A IV Location/Drains/Wounds Patient Lines/Drains/Airways Status     Active Line/Drains/Airways     Name Placement date Placement time Site Days   Peripheral IV 04/18/23 22 G Anterior;Right Forearm 04/18/23  2230  Forearm  1   Wound / Incision (Open or Dehisced) 01/09/23 Ischial tuberosity Left;Posterior Deep tissue injury, moisture associated, multiple skin  tears within wound 01/09/23  2300  Ischial tuberosity  100            Intake/Output Last 24 hours  Intake/Output Summary (Last 24 hours) at 04/19/2023 0818 Last data filed at 04/19/2023 0543 Gross per 24 hour  Intake 1000 ml  Output --  Net 1000 ml    Labs/Imaging Results for orders placed or performed during the hospital encounter of 04/18/23 (from the past 48 hours)  Ethanol     Status: None   Collection Time: 04/18/23 10:41 PM  Result Value Ref Range   Alcohol, Ethyl (B)  <10 <10 mg/dL    Comment: (NOTE) Lowest detectable limit for serum alcohol is 10 mg/dL.  For medical purposes only. Performed at [facility], [address], [place], [place] [address]   Protime-INR     Status: Abnormal   Collection Time: 04/18/23 10:41 PM  Result Value Ref Range   Prothrombin Time 18.6 (H) 11.4 - 15.2 seconds   INR 1.5 (H) 0.8 - 1.2    Comment: (NOTE) INR goal varies based on device and disease states. Performed at [facility], [address], [place], [place] [address]   APTT     Status: Abnormal   Collection Time: 04/18/23 10:41 PM  Result Value Ref Range   aPTT 37 (H) 24 - 36 seconds    Comment:        IF BASELINE aPTT IS ELEVATED, SUGGEST PATIENT RISK ASSESSMENT BE USED TO DETERMINE APPROPRIATE ANTICOAGULANT THERAPY. Performed at [facility], [address] [place], [place] [address]   CBC     Status: Abnormal   Collection Time: 04/18/23 10:41 PM  Result Value Ref Range   WBC 9.8 4.0 - 10.5 K/uL   RBC 3.52 (L) 3.87 - 5.11 MIL/uL   Hemoglobin 10.0 (L) 12.0 - 15.0 g/dL   HCT 68.2 (L) 63.9 - 53.9 %   MCV 90.1 80.0 - 100.0 fL   MCH 28.4 26.0 - 34.0 pg   MCHC 31.5 30.0 - 36.0 g/dL   RDW 82.2 (H) 88.4 - 84.4 %   Platelets 181 150 - 400 K/uL   nRBC 0.0 0.0 - 0.2 %    Comment: Performed at [facility], [address], [place], [place] [address]  Differential     Status: None   Collection Time: 04/18/23 10:41 PM  Result Value Ref Range   Neutrophils Relative % 64 %   Neutro Abs 6.2 1.7 - 7.7 K/uL   Lymphocytes Relative 22 %   Lymphs Abs 2.2 0.7 - 4.0 K/uL   Monocytes Relative 10 %   Monocytes Absolute 1.0 0.1 - 1.0 K/uL   Eosinophils Relative 3 %   Eosinophils Absolute 0.3 0.0 - 0.5 K/uL   Basophils Relative 0 %   Basophils Absolute 0.0 0.0 - 0.1 K/uL   Immature Granulocytes 1 %   Abs Immature Granulocytes 0.05 0.00 - 0.07 K/uL    Comment: Performed at [facility], [address] [place], [place] [address]   Comprehensive metabolic panel     Status: Abnormal   Collection Time: 04/18/23 10:41 PM  Result Value Ref Range   Sodium 138 135 - 145 mmol/L   Potassium 4.1 3.5 - 5.1 mmol/L   Chloride 107 98 - 111 mmol/L   CO2 24 22 - 32 mmol/L   Glucose, Bld 131 (H) 70 - 99 mg/dL    Comment: Glucose reference range applies only to  samples taken after fasting for at least 8 hours.   BUN 22 8 - 23 mg/dL   Creatinine, Ser 8.39 (H) 0.44 - 1.00 mg/dL   Calcium  8.5 (L) 8.9 - 10.3 mg/dL   Total Protein 6.0 (L) 6.5 - 8.1 g/dL   Albumin 2.1 (L) 3.5 - 5.0 g/dL   AST 23 15 - 41 U/L   ALT 13 0 - 44 U/L   Alkaline Phosphatase 55 38 - 126 U/L   Total Bilirubin 0.5 0.0 - 1.2 mg/dL   GFR, Estimated 30 (L) >60 mL/min    Comment: (NOTE) Calculated using the CKD-EPI Creatinine Equation (2021)    Anion gap 7 5 - 15    Comment: Performed at [facility], [address], [place], [place] [address]  CBG monitoring, ED     Status: Abnormal   Collection Time: 04/18/23 10:42 PM  Result Value Ref Range   Glucose-Capillary 118 (H) 70 - 99 mg/dL    Comment: Glucose reference range applies only to samples taken after fasting for at least 8 hours.  I-stat chem 8, ED     Status: Abnormal   Collection Time: 04/18/23 10:47 PM  Result Value Ref Range   Sodium 137 135 - 145 mmol/L   Potassium 4.1 3.5 - 5.1 mmol/L   Chloride 102 98 - 111 mmol/L   BUN 22 8 - 23 mg/dL   Creatinine, Ser 8.39 (H) 0.44 - 1.00 mg/dL   Glucose, Bld 873 (H) 70 - 99 mg/dL    Comment: Glucose reference range applies only to samples taken after fasting for at least 8 hours.   Calcium , Ion 1.16 1.15 - 1.40 mmol/L   TCO2 24 22 - 32 mmol/L   Hemoglobin 10.2 (L) 12.0 - 15.0 g/dL   HCT 69.9 (L) 63.9 - 53.9 %   MR BRAIN WO CONTRAST Result Date: 04/19/2023 CLINICAL DATA:  Slurred speech and facial droop. Left-sided weakness. EXAM: MRI HEAD WITHOUT CONTRAST TECHNIQUE: Multiplanar, multiecho pulse sequences of the brain and surrounding structures were  obtained without intravenous contrast. COMPARISON:  Head CT 04/18/2023 FINDINGS: Brain: No acute infarct, mass effect or extra-axial collection. No acute or chronic hemorrhage. There is multifocal hyperintense T2-weighted signal within the white matter. Generalized volume loss. Old right parietal lobe and basal ganglia infarcts. Wallerian degeneration at the right cerebral peduncle. The midline structures are normal. Vascular: Normal flow voids. Skull and upper cervical spine: Moderate maxillary sinus mucosal thickening. Fluid in the sphenoid sinus. Ocular lens replacements Sinuses/Orbits:No paranasal sinus fluid levels or advanced mucosal thickening. No mastoid or middle ear effusion. Normal orbits. IMPRESSION: 1. No acute intracranial abnormality. 2. Old right parietal lobe and basal ganglia infarcts. Electronically Signed   By: Franky Stanford M.D.   On: 04/19/2023 01:54   CT HEAD CODE STROKE WO CONTRAST Result Date: 04/18/2023 CLINICAL DATA:  Code stroke.  Slurred speech and facial droop EXAM: CT HEAD WITHOUT CONTRAST TECHNIQUE: Contiguous axial images were obtained from the base of the skull through the vertex without intravenous contrast. RADIATION DOSE REDUCTION: This exam was performed according to the departmental dose-optimization program which includes automated exposure control, adjustment of the mA and/or kV according to patient size and/or use of iterative reconstruction technique. COMPARISON:  01/22/2021 FINDINGS: Brain: There is no mass, hemorrhage or extra-axial collection. The size and configuration of the ventricles and extra-axial CSF spaces are normal. There is hypoattenuation of the periventricular white matter, most commonly indicating chronic ischemic microangiopathy. Old right parietal infarct. Vascular:  No abnormal hyperdensity of the major intracranial arteries or dural venous sinuses. No intracranial atherosclerosis. Skull: The visualized skull base, calvarium and extracranial soft  tissues are normal. Sinuses/Orbits: Bilateral maxillary sinus mucosal thickening. The orbits are normal. ASPECTS [facility] Stroke Program Early CT Score) - Ganglionic level infarction (caudate, lentiform nuclei, internal capsule, insula, M1-M3 cortex): 7 - Supraganglionic infarction (M4-M6 cortex): 3 Total score (0-10 with 10 being normal): 10 IMPRESSION: 1. No acute intracranial abnormality. 2. ASPECTS is 10. 3. Old right parietal infarct and chronic ischemic microangiopathy. These results were communicated to Dr. Lola Jernigan at 10:53 pm on 04/18/2023 by text page via the [facility] messaging system. Electronically Signed   By: Franky Stanford M.D.   On: 04/18/2023 22:54    Pending Labs Unresulted Labs (From admission, onward)     Start     Ordered   04/20/23 0500  Basic metabolic panel  Tomorrow morning,   R        04/19/23 0805   04/20/23 0500  CBC  Tomorrow morning,   R        04/19/23 0805   04/19/23 0806  Magnesium  Add-on,   AD        04/19/23 0805   04/19/23 0806  TSH  Add-on,   AD        04/19/23 0805   04/19/23 0804  CBC  (heparin )  Once,   R       Comments: Baseline for heparin  therapy IF NOT ALREADY DRAWN.  Notify MD if PLT < 100 K.    04/19/23 0805   04/19/23 0804  Creatinine, serum  (heparin )  Once,   R       Comments: Baseline for heparin  therapy IF NOT ALREADY DRAWN.    04/19/23 0805   04/18/23 2241  Urine rapid drug screen (hosp performed)  Once,   STAT        04/18/23 2241   04/18/23 2241  Urinalysis, Routine w reflex microscopic -Urine, Clean Catch  Once,   URGENT       Question:  Specimen Source  Answer:  Urine, Clean Catch   04/18/23 2241            Vitals/Pain Today's Vitals   04/19/23 0640 04/19/23 0700 04/19/23 0730 04/19/23 0800  BP:  (!) 147/51 (!) 141/55 (!) 149/56  Pulse:  88 87 91  Resp:  (!) 21 (!) 22 (!) 27  Temp: 99.8 F (37.7 C)     TempSrc: Oral     SpO2:  94% 96% 94%  Weight:      Height:      PainSc:        Isolation Precautions No  active isolations  Medications Medications  heparin  injection 5,000 Units (has no administration in time range)  acetaminophen  (TYLENOL ) tablet 650 mg (has no administration in time range)    Or  acetaminophen  (TYLENOL ) suppository 650 mg (has no administration in time range)  polyethylene glycol (MIRALAX  / GLYCOLAX ) packet 17 g (has no administration in time range)  ondansetron  (ZOFRAN ) tablet 4 mg (has no administration in time range)    Or  ondansetron  (ZOFRAN ) injection 4 mg (has no administration in time range)  sodium chloride  0.9 % bolus 1,000 mL (0 mLs Intravenous Stopped 04/19/23 0543)    Mobility non-ambulatory     Focused Assessments Neuro Assessment Handoff:  Swallow screen pass?    Cardiac Rhythm: Normal sinus rhythm, Bundle branch block NIH Stroke Scale  Dizziness Present: Yes Headache  Present: No Interval: Initial Level of Consciousness (1a.)   : Alert, keenly responsive LOC Questions (1b. )   : Answers both questions correctly LOC Commands (1c. )   : Performs both tasks correctly Best Gaze (2. )  : Normal Visual (3. )  : No visual loss Facial Palsy (4. )    : Normal symmetrical movements Motor Arm, Left (5a. )   : No effort against gravity Motor Arm, Right (5b. ) : No drift Motor Leg, Left (6a. )  : No effort against gravity Motor Leg, Right (6b. ) : Drift Limb Ataxia (7. ): Absent Sensory (8. )  : Normal, no sensory loss Best Language (9. )  : No aphasia Dysarthria (10. ): Mild-to-moderate dysarthria, patient slurs at least some words and, at worst, can be understood with some difficulty Extinction/Inattention (11.)   : No Abnormality Complete NIHSS TOTAL: 8 Last date known well: 04/18/23 Last time known well: 2130 Neuro Assessment:   Neuro Checks:   Initial (04/18/23 2240)  Has TPA been given? No If patient is a Neuro Trauma and patient is going to OR before floor call report to 4N Charge nurse: [phone] or [phone]   R Recommendations:  See Admitting Provider Note  Report given to:   Additional Notes:

## 2023-04-19 NOTE — Progress Notes (Signed)
 Patient  arrived to unit  from ED via bed. Patient is accompany by family.  Patient is alert and oriented  to self. On room air, no respiratory distress notes.

## 2023-04-19 NOTE — ED Notes (Signed)
Pt well appearing upon transport to floor.

## 2023-04-19 NOTE — Plan of Care (Signed)
 Patient is alert and oriented x 4 . Continue on room air.  Denies chest pain, chest pressure or sob.  Afebrile. No acute distress during this shift.  Safety precaution, bed alarm initiated. Bed lock in lowest position. Family at bed side. Problem: Coping: Goal: Level of anxiety will decrease Outcome: Progressing   Problem: Elimination: Goal: Will not experience complications related to bowel motility Outcome: Progressing Goal: Will not experience complications related to urinary retention Outcome: Progressing   Problem: Pain Management: Goal: General experience of comfort will improve Outcome: Progressing   Problem: Safety: Goal: Ability to remain free from injury will improve Outcome: Progressing   Problem: Skin Integrity: Goal: Risk for impaired skin integrity will decrease Outcome: Progressing

## 2023-04-19 NOTE — Evaluation (Signed)
 Physical Therapy Evaluation Patient Details Name: [name] MRN: [id number] DOB: [birth year] Today's Date: 04/19/2023  History of Present Illness  Pt is 89 yo female admitted on 04/18/23 with worsening L sided weakness, facial droop, and slurred speech.  MRI negative, neurology signed off, pt with AKI.  Pt with hx of CVA w L sided hemiparesis, bedbound, HTN, HLD, anxiety , depression, anemia, DVT, DDD lumbar and C-spine  Clinical Impression   Pt admitted with above diagnosis. Pt has L hemiparesis, bedbound, hoyer lift at baseline.  She reports L side currently at baseline but initially reports feels weaker and R side weaker than normal, so proceeded with eval. With testing reports her R side and mobility are about baseline.  Pt would require assist of 2 for bed mobility and hoyer lift for OOB.  Pt appears to be at baseline, has 24 hr support, and DME at home.  No further acute PT needs.       If plan is discharge home, recommend the following: Two people to help with walking and/or transfers;Two people to help with bathing/dressing/bathroom;Assistance with cooking/housework   Can travel by private vehicle        Equipment Recommendations None recommended by PT  Recommendations for Other Services       Functional Status Assessment Patient has not had a recent decline in their functional status     Precautions / Restrictions Precautions Precautions: Fall      Mobility  Bed Mobility Overal bed mobility: Needs Assistance             General bed mobility comments: Pt is total assist x 2 at baseline rolling, bed mobility and hoyer for OOB    Transfers                        Ambulation/Gait                  Stairs            Wheelchair Mobility     Tilt Bed    Modified Rankin (Stroke Patients Only)       Balance Overall balance assessment: Needs assistance     Sitting balance - Comments: bed bound                                      Pertinent Vitals/Pain Pain Assessment Pain Assessment: Faces Faces Pain Scale: Hurts little more Pain Location: Grimaces with L sided movement Pain Descriptors / Indicators: Grimacing Pain Intervention(s): Limited activity within patient's tolerance, Monitored during session, Repositioned    Home Living Family/patient expects to be discharged to:: Private residence Living Arrangements: Children Available Help at Discharge: Family;Personal care attendant;Available 24 hours/day Type of Home: House         Home Layout: One level Home Equipment: Wheelchair - Electronics Engineer (2 wheels);Cane - single point;Tub bench Additional Comments: Nurse, adult; 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  She has clonus when R foot placed in DF - baseline.  ROM: lacking DF with tight endfeel, hip and knee WFL but still, MMT: grossly 1/5 throughout LLE Deficits / Details: L side painful with movement, does not tolerate heel slides (reports baseline), tight DF, strength 0-very light1/5 throughout       Communication      Cognition Arousal: Alert Behavior During Therapy: WFL for tasks assessed/performed Overall Cognitive Status: Within Functional Limits for tasks assessed                                 General Comments: Pt with good sense of humor         General Comments General comments (skin integrity, edema, etc.): VSS.  Floating pt's heels and elevated L arm.  Pt reports floating heels at home    Exercises     Assessment/Plan    PT Assessment Patient does not need any further PT services  PT Problem List         PT Treatment Interventions      PT Goals (Current goals can be found in the Care Plan section)  Acute Rehab PT Goals Patient Stated Goal: return home PT Goal Formulation: All assessment and education complete, DC therapy    Frequency       Co-evaluation PT/OT/SLP Co-Evaluation/Treatment: Yes Reason for Co-Treatment: For patient/therapist safety           AM-PAC PT 6 Clicks Mobility  Outcome Measure Help needed turning from your back to your side while in a flat bed without using bedrails?: Total Help needed moving from lying on your back to sitting on the side of a flat bed without using bedrails?: Total Help needed moving to and from a bed to a chair (including a wheelchair)?: Total Help needed standing up from a chair using your arms (e.g., wheelchair or bedside chair)?: Total Help needed to walk in hospital room?: Total Help needed climbing 3-5 steps with a railing? : Total 6 Click Score: 6    End of Session   Activity Tolerance: Patient tolerated treatment well Patient left: in bed;with call bell/phone within reach;with bed alarm set;with family/visitor present Nurse Communication: Mobility status;Need for lift equipment PT Visit Diagnosis: Muscle weakness (generalized) (M62.81)    Time: 1201-1222 PT Time Calculation (min) (ACUTE ONLY): 21 min   Charges:   PT Evaluation $PT Eval Low Complexity: 1 Low   PT General Charges $$ ACUTE PT VISIT: 1 Visit         Hayley, PT Acute Rehab [facility] [phone]   [name] 04/19/2023, 12:35 PM

## 2023-04-19 NOTE — Evaluation (Signed)
 Occupational Therapy Evaluation Patient Details Name: [name] MRN: [id number] DOB: [birth year] Today's Date: 04/19/2023   History of Present Illness Pt is 89 yo female admitted on 04/18/23 with worsening L sided weakness, facial droop, and slurred speech.  MRI negative, neurology signed off, pt with AKI.  Pt with hx of CVA w L sided hemiparesis, bedbound, HTN, HLD, anxiety , depression, anemia, DVT, DDD lumbar and C-spine   Clinical Impression   Pt reports ability to self feed at baseline but has assist from family/caregivers for all other ADLs, uses hoyer lift to transfer to w/c. Pt with L sided deficits from prior CVA, states it seems to be at baseline. Elevated LUE on pillows for support at end of session. Pt presenting with impairments listed below, however is at baseline and has no acute OT needs at this time. Will s/o, please reconsult if there is a change in pt status.       If plan is discharge home, recommend the following: Two people to help with walking and/or transfers;A lot of help with bathing/dressing/bathroom;Assistance with cooking/housework;Direct supervision/assist for medications management;Direct supervision/assist for financial management;Assist for transportation;Help with stairs or ramp for entrance;Supervision due to cognitive status    Functional Status Assessment  Patient has had a recent decline in their functional status and demonstrates the ability to make significant improvements in function in a reasonable and predictable amount of time.  Equipment Recommendations  None recommended by OT (pt has all needed DME)    Recommendations for Other Services       Precautions / Restrictions Precautions Precautions: Fall Restrictions Weight Bearing Restrictions Per Provider Order: No      Mobility Bed Mobility Overal bed mobility: Needs Assistance             General bed mobility comments: Pt is total assist x 2 at baseline rolling, bed mobility and  hoyer for OOB    Transfers                          Balance                                           ADL either performed or assessed with clinical judgement   ADL Overall ADL's : At baseline                                       General ADL Comments: reports being able to feed self today     Vision   Vision Assessment?: No apparent visual deficits     Perception Perception: Not tested       Praxis Praxis: Not tested       Pertinent Vitals/Pain Pain Assessment Pain Assessment: Faces Pain Score: 4  Faces Pain Scale: Hurts little more Pain Location: Grimaces with L sided movement Pain Descriptors / Indicators: Grimacing Pain Intervention(s): Limited activity within patient's tolerance, Monitored during session, Repositioned     Extremity/Trunk Assessment Upper Extremity Assessment Upper Extremity Assessment: LUE deficits/detail LUE Deficits / Details: no AROM, noted incr tone at elbow/shoulder,   Lower Extremity Assessment Lower Extremity Assessment: Defer to PT evaluation RLE Deficits / Details: Pt initially reporting R side felt weaker than baseline, however, when tested states about normal.  She has clonus when  R foot placed in DF - baseline.  ROM: lacking DF with tight endfeel, hip and knee WFL but still, MMT: grossly 1/5 throughout LLE Deficits / Details: L side painful with movement, does not tolerate heel slides (reports baseline), tight DF, strength 0-very light1/5 throughout       Communication Communication Communication: No apparent difficulties   Cognition Arousal: Alert Behavior During Therapy: WFL for tasks assessed/performed Overall Cognitive Status: Within Functional Limits for tasks assessed                                       General Comments  VSS.  Floating pt's heels and elevated L arm.  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      Prior Functioning/Environment Prior Level of Function : Needs assist             Mobility Comments: Hoyer for transfers; +2 to position pt in WC; +2 bed mobility, nonambulatory at baseline since CVA ADLs Comments: total assist ADLs but does feed self        OT Problem List: Decreased strength;Decreased activity tolerance;Decreased range of motion;Impaired balance (sitting and/or standing);Decreased coordination;Decreased cognition;Cardiopulmonary status limiting activity      OT Treatment/Interventions:      OT Goals(Current goals can be found in the care plan section) Acute Rehab OT Goals Patient Stated Goal: none stated OT Goal Formulation: With patient Time For Goal Achievement: 05/03/23 Potential to Achieve Goals: Good  OT Frequency:      Co-evaluation   Reason for Co-Treatment: For patient/therapist safety          AM-PAC OT 6 Clicks Daily Activity     Outcome Measure Help from another person eating meals?: A Little Help from another person taking care of personal grooming?: A Little Help from another person toileting, which includes using toliet, bedpan, or urinal?: A Lot Help from another person bathing (including washing, rinsing, drying)?: A Lot Help from another person to put on and taking off regular upper body clothing?: A Lot Help from another person to put on and taking off regular lower body clothing?: A Lot 6 Click Score: 14   End of Session Nurse Communication:  Mobility status  Activity Tolerance: Patient tolerated treatment well Patient left: in bed;with call bell/phone within reach;with bed alarm set;with family/visitor present  OT Visit Diagnosis: Unsteadiness on feet (R26.81)                Time: 8798-8777 OT Time Calculation (min): 21 min Charges:  OT General Charges $OT Visit: 1 Visit OT Evaluation $OT Eval Low Complexity: 1 Low  [name], OTD, OTR/L SecureChat Preferred Acute Rehab [phone]   [name] 04/19/2023, 12:46 PM

## 2023-04-19 NOTE — H&P (Signed)
 History and Physical    [name] FMW:[id number] DOB: Aug 14, 1928 DOA: 04/18/2023  PCP: Hayley Atlas, MD  Patient coming from: Home  I have personally briefly reviewed patient's old medical records in [facility]  Chief Complaint: Strokelike symptoms  HPI: [name] is a 89 y.o. female with medical history significant of stroke with left-sided hemiplegia with bedbound status, hypertension, hyperlipidemia, anxiety, depression, normocytic anemia, history of DVT on Eliquis , DDD lumbar and C-spine presented for evaluation of strokelike symptoms.  Patient's caregiver noticed worsening left-sided weakness and slurred speech and left facial droop from baseline at 9:30 PM.  No seizure-like episode, loss of consciousness.  Family called EMS who noticed unequal pupil during their evaluation.  Patient brought to the ER for further eval and management of her strokelike symptoms.  Upon my evaluation: Patient denies any symptoms including headache, blurry vision, slurred speech, facial drop.  She reports cough since 4 days and has history of COPD and asthma.  History of former smoking.   ED Course: Upon arrival to ED: Temperature: 97.5, pulse 50, RR: 20, BP: 104/48.  CT head and MRI brain negative for any acute infarction.  Neurology consulted.  Patient was also found to have AKI with creatinine of 1.60 and GFR of 30.  Was given IV fluid bolus in ED.  [facility] hospitalist consulted for admission.   review of Systems: As per HPI otherwise negative.    Past Medical History:  Diagnosis Date   Allergic rhinitis    Arthritis    Carpal tunnel syndrome    CVA (cerebral vascular accident) (HCC)    Deep venous thrombosis (HCC)    bilateral legs   Degenerative arthritis    right knee   Hypertension    Lumbar degenerative disc disease    Lump or mass in breast    Paresthesia    Syncope 05/24/2013   Vitamin D deficiency     Past Surgical History:  Procedure Laterality Date    ABDOMINAL HYSTERECTOMY     CARPAL TUNNEL RELEASE Right    CATARACT EXTRACTION Bilateral    HEMORROIDECTOMY     IVC Filter     TOTAL HIP ARTHROPLASTY Right    ULNAR NERVE TRANSPOSITION Right      reports that she has been smoking cigarettes. She has a 300 pack-year smoking history. She has never been exposed to tobacco smoke. She has never used smokeless tobacco. She reports current alcohol use. She reports that she does not use drugs.  Allergies  Allergen Reactions   Codeine Other (See Comments)    Passed out   Penicillins Other (See Comments)    Passed out Has patient had a PCN reaction causing immediate rash, facial/tongue/throat swelling, SOB or lightheadedness with hypotension: Yes Has patient had a PCN reaction causing severe rash involving mucus membranes or skin necrosis: Unk Has patient had a PCN reaction that required hospitalization: No Has patient had a PCN reaction occurring within the last 10 years: No If all of the above answers are NO, then may proceed with Cephalosporin use.     Family History  Problem Relation Age of Onset   Diabetes Mother    Kidney failure Father    Cancer - Lung Brother    Cancer - Prostate Brother    Kidney failure Son     Prior to Admission medications   Medication Sig Start Date End Date Taking? Authorizing Provider  albuterol  (PROVENTIL ) (2.5 MG/3ML) 0.083% nebulizer solution Take 2.5 mg by nebulization every  6 (six) hours as needed. 08/11/22   [provider]  albuterol  (VENTOLIN  HFA) 108 (90 Base) MCG/ACT inhaler Inhale 1-2 puffs into the lungs every 6 (six) hours as needed for wheezing or shortness of breath.    [provider]  apixaban  (ELIQUIS ) 5 MG TABS tablet Take 5 mg by mouth 2 (two) times daily.    [provider]  atorvastatin  (LIPITOR) 20 MG tablet Take 1 tablet (20 mg total) by mouth daily. Patient taking differently: Take 20 mg by mouth at bedtime. 01/07/21   Hayley Earnie GRADE, PA-C  diclofenac   sodium (VOLTAREN ) 1 % GEL Apply 1 application topically 4 (four) times daily as needed (knee and hand pain).    [provider]  escitalopram  (LEXAPRO ) 10 MG tablet Take 10 mg by mouth every evening. 05/25/21   [provider]  furosemide (LASIX) 20 MG tablet Take 20 mg by mouth daily as needed. 01/23/23   [provider]  hydrOXYzine  (ATARAX ) 10 MG tablet Take 10 mg by mouth every 6 (six) hours as needed. 12/18/22   [provider]  lactobacillus acidophilus (BACID) TABS tablet Take 1 tablet by mouth daily.    [provider]  loperamide  (IMODIUM ) 2 MG capsule Take 1 capsule (2 mg total) by mouth as needed for diarrhea or loose stools. 01/24/22   Hayley [place], MD  losartan  (COZAAR ) 50 MG tablet Take 50 mg by mouth daily.    [provider]  meclizine  (ANTIVERT ) 25 MG tablet Take 25 mg by mouth 3 (three) times daily as needed for dizziness. As needed for vertigo    [provider]  NOREL AD 4-10-325 MG TABS Take 1 tablet by mouth 2 (two) times daily as needed. 12/05/22   [provider]  nystatin  (MYCOSTATIN /NYSTOP ) powder Apply 1 Application topically. 02/22/23   [provider]  pregabalin  (LYRICA ) 100 MG capsule Take 100 mg by mouth 3 (three) times daily. 05/31/21   [provider]  zinc  oxide 20 % ointment Apply 1 Application topically 2 (two) times daily as needed for irritation. 12/04/22   [provider]    Physical Exam: Vitals:   04/19/23 0500 04/19/23 0530 04/19/23 0630 04/19/23 0640  BP: (!) 171/70 (!) 150/56 (!) 147/49   Pulse: 93 87 88   Resp: (!) 24 19 (!) 26   Temp:    99.8 F (37.7 C)  TempSrc:    Oral  SpO2: 100% 95% 95%   Weight:      Height:        Constitutional: NAD, calm, comfortable, on room air, communicating well Eyes: PERRL, lids and conjunctivae normal ENMT: Mucous membranes are moist. Posterior pharynx clear of any exudate or lesions.Normal dentition.  Neck: normal,  supple, no masses, no thyromegaly Respiratory: Bilateral coarse breath sounds noted.  Cardiovascular: Regular rate and rhythm, no murmurs / rubs / gallops. No extremity edema. 2+ pedal pulses. No carotid bruits.  Abdomen: no tenderness, no masses palpated. No hepatosplenomegaly. Bowel sounds positive.  Musculoskeletal: no clubbing / cyanosis. No joint deformity upper and lower extremities. Good ROM, no contractures. Normal muscle tone.  Skin: no rashes, lesions, ulcers. No induration Neurologic: Alert and oriented x 3.  Able to answers question appropriately.  Left-sided hemiparesis noted.  Power 5 out of 5 in right upper extremity and 2 out of 5 in bilateral lower extremities. Psychiatric: Normal judgment and insight. Alert and oriented x 3. Normal mood.    Labs on Admission: I have personally reviewed following  labs and imaging studies  CBC: Recent Labs  Lab 04/18/23 2241 04/18/23 2247  WBC 9.8  --   NEUTROABS 6.2  --   HGB 10.0* 10.2*  HCT 31.7* 30.0*  MCV 90.1  --   PLT 181  --    Basic Metabolic Panel: Recent Labs  Lab 04/18/23 2241 04/18/23 2247  NA 138 137  K 4.1 4.1  CL 107 102  CO2 24  --   GLUCOSE 131* 126*  BUN 22 22  CREATININE 1.60* 1.60*  CALCIUM  8.5*  --    GFR: Estimated Creatinine Clearance: 31.6 mL/min (A) (by C-G formula based on SCr of 1.6 mg/dL (H)). Liver Function Tests: Recent Labs  Lab 04/18/23 2241  AST 23  ALT 13  ALKPHOS 55  BILITOT 0.5  PROT 6.0*  ALBUMIN 2.1*   No results for input(s): LIPASE, AMYLASE in the last 168 hours. No results for input(s): AMMONIA in the last 168 hours. Coagulation Profile: Recent Labs  Lab 04/18/23 2241  INR 1.5*   Cardiac Enzymes: No results for input(s): CKTOTAL, CKMB, CKMBINDEX, TROPONINI in the last 168 hours. BNP (last 3 results) No results for input(s): PROBNP in the last 8760 hours. HbA1C: No results for input(s): HGBA1C in the last 72 hours. CBG: Recent Labs  Lab  04/18/23 2242  GLUCAP 118*   Lipid Profile: No results for input(s): CHOL, HDL, LDLCALC, TRIG, CHOLHDL, LDLDIRECT in the last 72 hours. Thyroid  Function Tests: No results for input(s): TSH, T4TOTAL, FREET4, T3FREE, THYROIDAB in the last 72 hours. Anemia Panel: No results for input(s): VITAMINB12, FOLATE, FERRITIN, TIBC, IRON, RETICCTPCT in the last 72 hours. Urine analysis:    Component Value Date/Time   COLORURINE YELLOW 01/21/2022 1854   APPEARANCEUR HAZY (A) 01/21/2022 1854   LABSPEC 1.021 01/21/2022 1854   PHURINE 5.0 01/21/2022 1854   GLUCOSEU NEGATIVE 01/21/2022 1854   HGBUR LARGE (A) 01/21/2022 1854   BILIRUBINUR NEGATIVE 01/21/2022 1854   KETONESUR NEGATIVE 01/21/2022 1854   PROTEINUR NEGATIVE 01/21/2022 1854   UROBILINOGEN 1.0 11/07/2009 2127   NITRITE NEGATIVE 01/21/2022 1854   LEUKOCYTESUR TRACE (A) 01/21/2022 1854    Radiological Exams on Admission: MR BRAIN WO CONTRAST Result Date: 04/19/2023 CLINICAL DATA:  Slurred speech and facial droop. Left-sided weakness. EXAM: MRI HEAD WITHOUT CONTRAST TECHNIQUE: Multiplanar, multiecho pulse sequences of the brain and surrounding structures were obtained without intravenous contrast. COMPARISON:  Head CT 04/18/2023 FINDINGS: Brain: No acute infarct, mass effect or extra-axial collection. No acute or chronic hemorrhage. There is multifocal hyperintense T2-weighted signal within the white matter. Generalized volume loss. Old right parietal lobe and basal ganglia infarcts. Wallerian degeneration at the right cerebral peduncle. The midline structures are normal. Vascular: Normal flow voids. Skull and upper cervical spine: Moderate maxillary sinus mucosal thickening. Fluid in the sphenoid sinus. Ocular lens replacements Sinuses/Orbits:No paranasal sinus fluid levels or advanced mucosal thickening. No mastoid or middle ear effusion. Normal orbits. IMPRESSION: 1. No acute intracranial abnormality. 2. Old  right parietal lobe and basal ganglia infarcts. Electronically Signed   By: Franky Stanford M.D.   On: 04/19/2023 01:54   CT HEAD CODE STROKE WO CONTRAST Result Date: 04/18/2023 CLINICAL DATA:  Code stroke.  Slurred speech and facial droop EXAM: CT HEAD WITHOUT CONTRAST TECHNIQUE: Contiguous axial images were obtained from the base of the skull through the vertex without intravenous contrast. RADIATION DOSE REDUCTION: This exam was performed according to the departmental dose-optimization program which includes automated exposure control, adjustment of the mA and/or kV  according to patient size and/or use of iterative reconstruction technique. COMPARISON:  01/22/2021 FINDINGS: Brain: There is no mass, hemorrhage or extra-axial collection. The size and configuration of the ventricles and extra-axial CSF spaces are normal. There is hypoattenuation of the periventricular white matter, most commonly indicating chronic ischemic microangiopathy. Old right parietal infarct. Vascular: No abnormal hyperdensity of the major intracranial arteries or dural venous sinuses. No intracranial atherosclerosis. Skull: The visualized skull base, calvarium and extracranial soft tissues are normal. Sinuses/Orbits: Bilateral maxillary sinus mucosal thickening. The orbits are normal. ASPECTS [facility] Stroke Program Early CT Score) - Ganglionic level infarction (caudate, lentiform nuclei, internal capsule, insula, M1-M3 cortex): 7 - Supraganglionic infarction (M4-M6 cortex): 3 Total score (0-10 with 10 being normal): 10 IMPRESSION: 1. No acute intracranial abnormality. 2. ASPECTS is 10. 3. Old right parietal infarct and chronic ischemic microangiopathy. These results were communicated to Dr. Lola Jernigan at 10:53 pm on 04/18/2023 by text page via the [facility] messaging system. Electronically Signed   By: Franky Stanford M.D.   On: 04/18/2023 22:54    EKG: Independently reviewed.  Sinus rhythm with heart rate of 51 with LBBB and  LAFB.  Assessment/Plan  Strokelike symptoms: -Presented with transient worsening of left-sided symptoms.  Thought to be related to hypoperfusion/recrudescence in the setting of hypovolemia.  She does have a prior history of CVA with residual left-sided hemiparesis and bedbound status. -CT head and MRI brain negative for any acute infarction/hemorrhage. -Evaluated by neurology-signed off.  UA, UDS: Pending.  Ethanol within normal limit. -Admit under observation. -Consult PT/OT  AKI: Likely due to poor p.o. intake. -Received IV fluid in ER.  Will continue gentle hydration.  Monitor renal function closely.  Avoid NSAIDs.  Hold on losartan  and Lasix.  Hypertension: Blood pressure initially on lower range.  Will hold on losartan  and Lasix.  Will monitor BP closely.  Depression with anxiety: Continue Lexapro  and hydroxyzine  as needed  COPD/asthma: Former smoker: -Patient complaining of cough, chest congestion since 4 days. -Will chest x-ray.  Currently on room air.  History of DVT: Continue Eliquis   Hypercholesteremia: Continue with statin  Normocytic anemia: Baseline hemoglobin 11 -Continue to monitor.  DVT prophylaxis: Eliquis  Code Status: Full code Family Communication: None present at bedside.  Plan of care discussed with patient in length and she verbalized understanding and agreed with it. Disposition Plan: Home Consults called: Neurology Admission status: Observation   Velna JONELLE Skeeter MD [facility]  If 7PM-7AM, please contact night-coverage [email or web address]  04/19/2023, 8:06 AM

## 2023-04-20 DIAGNOSIS — I10 Essential (primary) hypertension: Secondary | ICD-10-CM

## 2023-04-20 DIAGNOSIS — G9340 Encephalopathy, unspecified: Secondary | ICD-10-CM | POA: Diagnosis not present

## 2023-04-20 LAB — CBC
HCT: 33.4 % — ABNORMAL LOW (ref 36.0–46.0)
Hemoglobin: 10.7 g/dL — ABNORMAL LOW (ref 12.0–15.0)
MCH: 28.2 pg (ref 26.0–34.0)
MCHC: 32 g/dL (ref 30.0–36.0)
MCV: 87.9 fL (ref 80.0–100.0)
Platelets: 206 10*3/uL (ref 150–400)
RBC: 3.8 MIL/uL — ABNORMAL LOW (ref 3.87–5.11)
RDW: 17.4 % — ABNORMAL HIGH (ref 11.5–15.5)
WBC: 14.3 10*3/uL — ABNORMAL HIGH (ref 4.0–10.5)
nRBC: 0 % (ref 0.0–0.2)

## 2023-04-20 LAB — BASIC METABOLIC PANEL
Anion gap: 7 (ref 5–15)
BUN: 13 mg/dL (ref 8–23)
CO2: 21 mmol/L — ABNORMAL LOW (ref 22–32)
Calcium: 8.5 mg/dL — ABNORMAL LOW (ref 8.9–10.3)
Chloride: 109 mmol/L (ref 98–111)
Creatinine, Ser: 1.05 mg/dL — ABNORMAL HIGH (ref 0.44–1.00)
GFR, Estimated: 50 mL/min — ABNORMAL LOW (ref >60)
Glucose, Bld: 108 mg/dL — ABNORMAL HIGH (ref 70–99)
Potassium: 3.6 mmol/L (ref 3.5–5.1)
Sodium: 137 mmol/L (ref 135–145)

## 2023-04-20 LAB — MAGNESIUM: Magnesium: 2.2 mg/dL (ref 1.7–2.4)

## 2023-04-20 LAB — TSH: TSH: 1.908 u[IU]/mL (ref 0.350–4.500)

## 2023-04-20 NOTE — Progress Notes (Addendum)
 Transition of Care [facility]) - Inpatient Brief Assessment   Patient Details  Name: BASSHEVA FLURY MRN: [id number] Date of Birth: 18-Nov-1928  Transition of Care [facility]) CM/SW Contact:    Rosaline JONELLE Joe, RN Phone Number: 04/20/2023, 11:17 AM   Clinical Narrative: Cm met with the patient and daughter, Legal guardian at the bedside to discuss TOC needs.  The patient's daughter, Heron Ada, was provided with the [facility] form.  The patient's daughter provides 24 hour care at the home and has 40 hours per week aide assistance through [employer] program.  Patient has all DME at the home including hospital bed, hoyer lift, WC, and lift chair at the home.  Labs were drawn this morning and patient will likely discharge home by [facility] later today once medically clear.  The patient's daughter was updated at the bedside.  I called [facility] and bedside nursing was asked to take the black manuel lift to [facility] ER to return to EMS.  04/19/22 1331-  [facility] called for patient's transport to home.  I called and updated the patient's daughter by phone.   Transition of Care Asessment: Insurance and Status: (P) Insurance coverage has been reviewed Patient has primary care physician: (P) Yes Home environment has been reviewed: (P) from home with daughter Prior level of function:: (P) family assistance/ Personal Care Services through [facility] Prior/Current Home Services: (P) Current home services (Active with [employer] program) Social Drivers of Health Review: (P) SDOH reviewed interventions complete Readmission risk has been reviewed: (P) Yes Transition of care needs: (P) transition of care needs identified, TOC will continue to follow

## 2023-04-20 NOTE — Plan of Care (Signed)

## 2023-04-20 NOTE — Care Management Obs Status (Cosign Needed)
 MEDICARE OBSERVATION STATUS NOTIFICATION   Patient Details  Name: [name] MRN: [id number] Date of Birth: June 02, 1928   Medicare Observation Status Notification Given:  Yes    Janae Bridgeman, RN 04/20/2023, 11:06 AM

## 2023-04-20 NOTE — Discharge Summary (Signed)
 Physician Discharge Summary  SITA MANGEN FMW:[id number] DOB: Jan 18, 1929 DOA: 04/18/2023  PCP: Shelda Atlas, MD  Admit date: 04/18/2023 Discharge date: 04/20/2023  Admitted From: Home  Discharge disposition: Home   Recommendations for Outpatient Follow-Up:   Follow up with your primary care provider in one week.  Check CBC, BMP, magnesium in the next visit   Discharge Diagnosis:   Principal Problem:   Acute encephalopathy Active Problems:   Essential hypertension   Discharge Condition: Improved.  Diet recommendation:  Regular.  Wound care: None.  Code status: Full.   History of Present Illness:   [name] is a 89 y.o. female with past medical history significant of stroke with left-sided hemiplegia with bedbound status, hypertension, hyperlipidemia, anxiety, depression, normocytic anemia, history of DVT on Eliquis , DDD lumbar and C-spine presented to the hospital with left-sided weakness and slurred speech with facial droop.  Patient was then brought into the hospital for evaluation of strokelike symptoms.  In the ED, vitals were stable. CT head and MRI brain negative for any acute infarction.  Neurology was consulted.  Patient was also found to have AKI with creatinine of 1.60 and GFR of 30.  Patient was given IV fluid bolus in ED and  [facility] was consulted for admission.   Hospital Course:   Following conditions were addressed during hospitalization as listed below,  Strokelike symptoms:  Presented with transient symptoms.  Likely secondary to dehydration and AKI.  History of previous CVA with residual left hemiparesis and bedbound status.  CT head and MRI at this time negative for acute findings.  Neurology has signed off at this time.  PT OT recommends no skilled therapy needs.   AKI: Likely due to poor p.o. intake.  Improved at this time.  Patient was on losartan  and Lasix at home.  Currently on hold.  Will resume losartan  at discharge but  resume Lasix from 04/21/2022. .  Initial creatinine at 1.6.  Creatinine today at 1.0.    Hypertension: Resume losartan  on discharge.  Lasix start 04/22/2023.   Depression with anxiety:  Continue Lexapro  and hydroxyzine  as needed   COPD/asthma: Former smoker: Chest x-ray with perihilar interstitial markings.  Continue supportive care. No acute issues.   History of DVT:  Continue Eliquis    Hypercholesteremia:  Continue with statin   Normocytic anemia:  Baseline hemoglobin 11  Grade II obesity. .Body mass index is 40.83 kg/m.  Would benefit from weight loss as outpatient.   Disposition.  At this time, patient is stable for disposition home.  Spoke with the patient's daughter at bedside.  Medical Consultants:   Neurology  Procedures:   None   Subjective:   Today, patient was seen and examined at bedside.  Patient's daughter at bedside.  At baseline.  No confusion disorientation shortness of breath chest pain.    Discharge Exam:   Vitals:   04/20/23 0756 04/20/23 1237  BP: (!) 129/38 (!) 133/48  Pulse: 71 68  Resp: 19 17  Temp: 97.8 F (36.6 C) 98.2 F (36.8 C)  SpO2: 98% 100%   Vitals:   04/20/23 0437 04/20/23 0500 04/20/23 0756 04/20/23 1237  BP: (!) 134/47  (!) 129/38 (!) 133/48  Pulse: 67  71 68  Resp:   19 17  Temp: 98.5 F (36.9 C)  97.8 F (36.6 C) 98.2 F (36.8 C)  TempSrc: Oral  Oral Oral  SpO2: 100%  98% 100%  Weight:  125.4 kg    Height:  Body mass index is 40.83 kg/m.   General: Alert awake, not in obvious distress, obese. HENT: pupils equally reacting to light,  No scleral pallor or icterus noted. Oral mucosa is moist.  Chest:  Clear breath sounds.  Diminished breath sounds bilaterally. No crackles or wheezes.  CVS: S1 &S2 heard. No murmur.  Regular rate and rhythm. Abdomen: Soft, nontender, nondistended.  Bowel sounds are heard.   Extremities: No cyanosis, clubbing or edema.  Peripheral pulses are palpable. Psych: Alert, awake and  oriented, Communicative. CNS:  No cranial nerve deficits.  Left hemiparesis.  Trace movement noted. Skin: Warm and dry.  No rashes noted.  The results of significant diagnostics from this hospitalization (including imaging, microbiology, ancillary and laboratory) are listed below for reference.     Diagnostic Studies:   DG CHEST PORT 1 VIEW Result Date: 04/19/2023 CLINICAL DATA:  [id number] Pneumonia [id number] EXAM: PORTABLE CHEST - 1 VIEW COMPARISON:  01/21/2022 FINDINGS: Mildly prominent perihilar interstitial markings right greater than left. No confluent airspace disease or overt edema. Heart size and mediastinal contours are within normal limits. Aortic Atherosclerosis (ICD10-170.0). No effusion. Visualized bones unremarkable. IMPRESSION: Mildly prominent perihilar interstitial markings right greater than left. Electronically Signed   By: JONETTA Faes M.D.   On: 04/19/2023 09:14   MR BRAIN WO CONTRAST Result Date: 04/19/2023 CLINICAL DATA:  Slurred speech and facial droop. Left-sided weakness. EXAM: MRI HEAD WITHOUT CONTRAST TECHNIQUE: Multiplanar, multiecho pulse sequences of the brain and surrounding structures were obtained without intravenous contrast. COMPARISON:  Head CT 04/18/2023 FINDINGS: Brain: No acute infarct, mass effect or extra-axial collection. No acute or chronic hemorrhage. There is multifocal hyperintense T2-weighted signal within the white matter. Generalized volume loss. Old right parietal lobe and basal ganglia infarcts. Wallerian degeneration at the right cerebral peduncle. The midline structures are normal. Vascular: Normal flow voids. Skull and upper cervical spine: Moderate maxillary sinus mucosal thickening. Fluid in the sphenoid sinus. Ocular lens replacements Sinuses/Orbits:No paranasal sinus fluid levels or advanced mucosal thickening. No mastoid or middle ear effusion. Normal orbits. IMPRESSION: 1. No acute intracranial abnormality. 2. Old right parietal lobe and basal ganglia  infarcts. Electronically Signed   By: Franky Stanford M.D.   On: 04/19/2023 01:54   CT HEAD CODE STROKE WO CONTRAST Result Date: 04/18/2023 CLINICAL DATA:  Code stroke.  Slurred speech and facial droop EXAM: CT HEAD WITHOUT CONTRAST TECHNIQUE: Contiguous axial images were obtained from the base of the skull through the vertex without intravenous contrast. RADIATION DOSE REDUCTION: This exam was performed according to the departmental dose-optimization program which includes automated exposure control, adjustment of the mA and/or kV according to patient size and/or use of iterative reconstruction technique. COMPARISON:  01/22/2021 FINDINGS: Brain: There is no mass, hemorrhage or extra-axial collection. The size and configuration of the ventricles and extra-axial CSF spaces are normal. There is hypoattenuation of the periventricular white matter, most commonly indicating chronic ischemic microangiopathy. Old right parietal infarct. Vascular: No abnormal hyperdensity of the major intracranial arteries or dural venous sinuses. No intracranial atherosclerosis. Skull: The visualized skull base, calvarium and extracranial soft tissues are normal. Sinuses/Orbits: Bilateral maxillary sinus mucosal thickening. The orbits are normal. ASPECTS [facility] Stroke Program Early CT Score) - Ganglionic level infarction (caudate, lentiform nuclei, internal capsule, insula, M1-M3 cortex): 7 - Supraganglionic infarction (M4-M6 cortex): 3 Total score (0-10 with 10 being normal): 10 IMPRESSION: 1. No acute intracranial abnormality. 2. ASPECTS is 10. 3. Old right parietal infarct and chronic ischemic microangiopathy. These results were communicated to  Dr. Lola Jernigan at 10:53 pm on 04/18/2023 by text page via the [facility] messaging system. Electronically Signed   By: Franky Stanford M.D.   On: 04/18/2023 22:54     Labs:   Basic Metabolic Panel: Recent Labs  Lab 04/18/23 2241 04/18/23 2247 04/20/23 1047  NA 138 137 137  K 4.1 4.1  3.6  CL 107 102 109  CO2 24  --  21*  GLUCOSE 131* 126* 108*  BUN 22 22 13   CREATININE 1.60* 1.60* 1.05*  CALCIUM  8.5*  --  8.5*  MG  --   --  2.2   GFR Estimated Creatinine Clearance: 48.5 mL/min (A) (by C-G formula based on SCr of 1.05 mg/dL (H)). Liver Function Tests: Recent Labs  Lab 04/18/23 2241  AST 23  ALT 13  ALKPHOS 55  BILITOT 0.5  PROT 6.0*  ALBUMIN 2.1*   No results for input(s): LIPASE, AMYLASE in the last 168 hours. No results for input(s): AMMONIA in the last 168 hours. Coagulation profile Recent Labs  Lab 04/18/23 2241  INR 1.5*    CBC: Recent Labs  Lab 04/18/23 2241 04/18/23 2247 04/20/23 1047  WBC 9.8  --  14.3*  NEUTROABS 6.2  --   --   HGB 10.0* 10.2* 10.7*  HCT 31.7* 30.0* 33.4*  MCV 90.1  --  87.9  PLT 181  --  206   Cardiac Enzymes: No results for input(s): CKTOTAL, CKMB, CKMBINDEX, TROPONINI in the last 168 hours. BNP: Invalid input(s): POCBNP CBG: Recent Labs  Lab 04/18/23 2242 04/19/23 1847  GLUCAP 118* 190*   D-Dimer No results for input(s): DDIMER in the last 72 hours. Hgb A1c No results for input(s): HGBA1C in the last 72 hours. Lipid Profile No results for input(s): CHOL, HDL, LDLCALC, TRIG, CHOLHDL, LDLDIRECT in the last 72 hours. Thyroid  function studies Recent Labs    04/20/23 1046  TSH 1.908   Anemia work up No results for input(s): VITAMINB12, FOLATE, FERRITIN, TIBC, IRON, RETICCTPCT in the last 72 hours. Microbiology No results found for this or any previous visit (from the past 240 hours).   Discharge Instructions:   Discharge Instructions     Call MD for:  persistant nausea and vomiting   Complete by: As directed    Call MD for:  severe uncontrolled pain   Complete by: As directed    Call MD for:  temperature >100.4   Complete by: As directed    Diet general   Complete by: As directed    Discharge instructions   Complete by: As directed    Follow-up  with your primary care provider in 1 week.  Check blood work at that time.  Seek medical attention for worsening symptoms.   Increase activity slowly   Complete by: As directed       Allergies as of 04/20/2023       Reactions   Codeine Other (See Comments)   Passed out   Penicillins Other (See Comments)   Passed out Has patient had a PCN reaction causing immediate rash, facial/tongue/throat swelling, SOB or lightheadedness with hypotension: Yes Has patient had a PCN reaction causing severe rash involving mucus membranes or skin necrosis: Unk Has patient had a PCN reaction that required hospitalization: No Has patient had a PCN reaction occurring within the last 10 years: No If all of the above answers are NO, then may proceed with Cephalosporin use.        Medication List     PAUSE  taking these medications    furosemide 20 MG tablet Wait to take this until: April 22, 2023 Morning Commonly known as: LASIX Take 20 mg by mouth daily as needed.       TAKE these medications    albuterol  108 (90 Base) MCG/ACT inhaler Commonly known as: VENTOLIN  HFA Inhale 1-2 puffs into the lungs every 6 (six) hours as needed for wheezing or shortness of breath.   albuterol  (2.5 MG/3ML) 0.083% nebulizer solution Commonly known as: PROVENTIL  Take 2.5 mg by nebulization every 6 (six) hours as needed.   apixaban  5 MG Tabs tablet Commonly known as: ELIQUIS  Take 5 mg by mouth 2 (two) times daily.   atorvastatin  20 MG tablet Commonly known as: LIPITOR Take 1 tablet (20 mg total) by mouth daily. What changed: when to take this   diclofenac  sodium 1 % Gel Commonly known as: VOLTAREN  Apply 1 application topically 4 (four) times daily as needed (knee and hand pain).   escitalopram  10 MG tablet Commonly known as: LEXAPRO  Take 10 mg by mouth every evening.   hydrOXYzine  10 MG tablet Commonly known as: ATARAX  Take 10 mg by mouth every 6 (six) hours as needed.   lactobacillus  acidophilus Tabs tablet Take 1 tablet by mouth daily.   loperamide  2 MG capsule Commonly known as: IMODIUM  Take 1 capsule (2 mg total) by mouth as needed for diarrhea or loose stools.   losartan  50 MG tablet Commonly known as: COZAAR  Take 50 mg by mouth daily.   meclizine  25 MG tablet Commonly known as: ANTIVERT  Take 25 mg by mouth 3 (three) times daily as needed for dizziness. As needed for vertigo   Norel AD 4-10-325 MG Tabs Generic drug: Chlorphen-PE-Acetaminophen  Take 1 tablet by mouth 2 (two) times daily as needed.   nystatin  powder Commonly known as: MYCOSTATIN /NYSTOP  Apply 1 Application topically.   pregabalin  100 MG capsule Commonly known as: LYRICA  Take 100 mg by mouth 3 (three) times daily.   zinc  oxide 20 % ointment Apply 1 Application topically 2 (two) times daily as needed for irritation.        Follow-up Information     Shelda Atlas, MD Follow up in 1 week(s).   Specialty: Internal Medicine Contact information: [address] [place] [address] [phone]                  Time coordinating discharge: 39 minutes  Signed:  Hiroyuki Ozanich  [facility] 04/20/2023, 3:48 PM

## 2023-05-26 ENCOUNTER — Ambulatory Visit: Payer: 59 | Admitting: Podiatry

## 2024-01-22 ENCOUNTER — Encounter: Payer: Self-pay | Admitting: Podiatry

## 2024-01-22 ENCOUNTER — Ambulatory Visit: Admitting: Podiatry

## 2024-01-22 DIAGNOSIS — M79675 Pain in left toe(s): Secondary | ICD-10-CM | POA: Diagnosis not present

## 2024-01-22 DIAGNOSIS — M79674 Pain in right toe(s): Secondary | ICD-10-CM

## 2024-01-22 DIAGNOSIS — B351 Tinea unguium: Secondary | ICD-10-CM

## 2024-01-22 DIAGNOSIS — L600 Ingrowing nail: Secondary | ICD-10-CM

## 2024-01-22 NOTE — Progress Notes (Addendum)
 This 89 yo patient presents to the office for nail care for her feet.  She has not been seen for over 10 months.  The nails are now painful when wearing her shoes.  She saw Dr.  Silva last year.  She presents to the office in a wheelchair and with female caregiver. 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  She presents to the office for evaluation and treatment.  General Appearance  Alert, conversant and in no acute stress.  Vascular  Dorsalis pedis and posterior tibial  pulses are  weakly palpable  bilaterally.  Capillary return is within normal limits  bilaterally. Temperature is within normal limits  bilaterally.  Neurologic  Senn-Weinstein monofilament wire test within normal limits  bilaterally. Muscle power within normal limits bilaterally.  Nails Thick disfigured discolored nails with subungual debris  from hallux to fifth toes bilaterally. No evidence of bacterial infection or drainage bilaterally.  Red ness noted distal aspect second toenail right foot.  Orthopedic  No limitations of motion  feet .  No crepitus or effusions noted.  No bony pathology or digital deformities noted.  Skin  normotropic skin with no porokeratosis noted bilaterally.  No signs of infections or ulcers noted.   Onychomycosis  B/L  Ingrown toenail second toe right foot.  ROV.  Debride nails with nail nipper and dremel tool.   Upon debriding her nails she had two separate areas the nail has grown into the skin, one on the third toe left and second toe right.  After nail removal at those two sites bleeding started to occur due to nail growing into skin.  The bleeding was noted since the patient was taking eliquis .  These two sites were cauterized multiple times.  Finally these sites were bandaged with neosporin/DSD.  No evidence of infection was noted today at these sites.  Told her to soak in epsom salts for at least three days.   This patient was told to return in three months.  Call the office if problem  occurs.  Cordella Bold DPM    Addendum  Patient also had an ingrown toenail medial border right hallux.  The medial border of her nail was growing into the distal aspect medial border This nail was removed from right hallux toenail.  It too was growing into the skin.  No infection noted. No bleeding was noted at this site but the area of medial border was cauterized prophylactically. She had already been instructed to soak her foot in epsom salts.  RTC 3 months.   Cordella Bold DPM

## 2024-04-23 ENCOUNTER — Ambulatory Visit (INDEPENDENT_AMBULATORY_CARE_PROVIDER_SITE_OTHER): Admitting: Podiatry

## 2024-04-23 ENCOUNTER — Encounter: Payer: Self-pay | Admitting: Podiatry

## 2024-04-23 DIAGNOSIS — M79674 Pain in right toe(s): Secondary | ICD-10-CM | POA: Diagnosis not present

## 2024-04-23 DIAGNOSIS — R234 Changes in skin texture: Secondary | ICD-10-CM

## 2024-04-23 DIAGNOSIS — I63511 Cerebral infarction due to unspecified occlusion or stenosis of right middle cerebral artery: Secondary | ICD-10-CM

## 2024-04-23 DIAGNOSIS — L600 Ingrowing nail: Secondary | ICD-10-CM

## 2024-04-23 DIAGNOSIS — B351 Tinea unguium: Secondary | ICD-10-CM | POA: Diagnosis not present

## 2024-04-23 DIAGNOSIS — M79675 Pain in left toe(s): Secondary | ICD-10-CM

## 2024-04-23 NOTE — Progress Notes (Signed)
 This 89 yo patient presents to the office for nail care for her feet.   The nails are now painful when wearing her shoes.  She presents to the office in a wheelchair and with female caregiver. 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  She presents to the office for evaluation and treatment.  General Appearance  Alert, conversant and in no acute stress.  Vascular  Dorsalis pedis and posterior tibial  pulses are  weakly palpable  bilaterally.  Capillary return is within normal limits  bilaterally. Temperature is within normal limits  bilaterally.  Neurologic  Senn-Weinstein monofilament wire test within normal limits  bilaterally. Muscle power within normal limits bilaterally.  Nails Thick disfigured discolored nails with subungual debris  from hallux to fifth toes bilaterally. No evidence of bacterial infection or drainage bilaterally.  Her fifth toenail left foot has become thickened and unattached to the nail bed.  No pus noted.  She also has blackened medial border right hallux.    Orthopedic  No limitations of motion  feet .  No crepitus or effusions noted.  No bony pathology or digital deformities noted.  Skin  normotropic skin with no porokeratosis noted bilaterally.  No signs of infections or ulcers noted.  Onychomycosis  B/L    Ingrown nail right hallux.    Fifth toenail left foot was removed exposing the nail bed.  Site was bandaged with DSD.  Ingrown nail medial border right hallux was removed.  This site was cauterized and bandaged with DSD. Told the caregiver to peroxide the two sites that were treated.  Debride the remaining nails with nail nipper and dremel tool.  Call the office if problem occurs.  Cordella Bold DPM

## 2024-05-10 ENCOUNTER — Other Ambulatory Visit: Payer: Self-pay

## 2024-05-10 ENCOUNTER — Emergency Department (HOSPITAL_COMMUNITY)

## 2024-05-10 ENCOUNTER — Emergency Department (HOSPITAL_COMMUNITY)
Admission: EM | Admit: 2024-05-10 | Discharge: 2024-05-11 | Disposition: A | Attending: Emergency Medicine | Admitting: Emergency Medicine

## 2024-05-10 DIAGNOSIS — Z7901 Long term (current) use of anticoagulants: Secondary | ICD-10-CM | POA: Diagnosis not present

## 2024-05-10 DIAGNOSIS — Z79899 Other long term (current) drug therapy: Secondary | ICD-10-CM | POA: Insufficient documentation

## 2024-05-10 DIAGNOSIS — I10 Essential (primary) hypertension: Secondary | ICD-10-CM | POA: Diagnosis not present

## 2024-05-10 DIAGNOSIS — Z96641 Presence of right artificial hip joint: Secondary | ICD-10-CM | POA: Insufficient documentation

## 2024-05-10 DIAGNOSIS — N3090 Cystitis, unspecified without hematuria: Secondary | ICD-10-CM | POA: Insufficient documentation

## 2024-05-10 DIAGNOSIS — R4182 Altered mental status, unspecified: Secondary | ICD-10-CM | POA: Diagnosis present

## 2024-05-10 LAB — CBC WITH DIFFERENTIAL/PLATELET
Abs Immature Granulocytes: 0.01 K/uL (ref 0.00–0.07)
Basophils Absolute: 0 K/uL (ref 0.0–0.1)
Basophils Relative: 1 %
Eosinophils Absolute: 0.2 K/uL (ref 0.0–0.5)
Eosinophils Relative: 4 %
HCT: 42.2 % (ref 36.0–46.0)
Hemoglobin: 12.3 g/dL (ref 12.0–15.0)
Immature Granulocytes: 0 %
Lymphocytes Relative: 36 %
Lymphs Abs: 1.8 K/uL (ref 0.7–4.0)
MCH: 25.4 pg — ABNORMAL LOW (ref 26.0–34.0)
MCHC: 29.1 g/dL — ABNORMAL LOW (ref 30.0–36.0)
MCV: 87.2 fL (ref 80.0–100.0)
Monocytes Absolute: 0.6 K/uL (ref 0.1–1.0)
Monocytes Relative: 12 %
Neutro Abs: 2.4 K/uL (ref 1.7–7.7)
Neutrophils Relative %: 47 %
Platelets: 256 K/uL (ref 150–400)
RBC: 4.84 MIL/uL (ref 3.87–5.11)
RDW: 18.4 % — ABNORMAL HIGH (ref 11.5–15.5)
WBC: 5 K/uL (ref 4.0–10.5)
nRBC: 0 % (ref 0.0–0.2)

## 2024-05-10 LAB — URINALYSIS, W/ REFLEX TO CULTURE (INFECTION SUSPECTED)
Bacteria, UA: NONE SEEN
Bilirubin Urine: NEGATIVE
Glucose, UA: NEGATIVE mg/dL
Ketones, ur: NEGATIVE mg/dL
Nitrite: NEGATIVE
Protein, ur: 100 mg/dL — AB
RBC / HPF: >50 RBC/hpf (ref 0–5)
Specific Gravity, Urine: 1.013 (ref 1.005–1.030)
WBC, UA: >50 WBC/hpf (ref 0–5)
pH: 5 (ref 5.0–8.0)

## 2024-05-10 LAB — COMPREHENSIVE METABOLIC PANEL WITH GFR
ALT: 24 U/L (ref 0–44)
AST: 35 U/L (ref 15–41)
Albumin: 3.8 g/dL (ref 3.5–5.0)
Alkaline Phosphatase: 98 U/L (ref 38–126)
Anion gap: 9 (ref 5–15)
BUN: 8 mg/dL (ref 8–23)
CO2: 29 mmol/L (ref 22–32)
Calcium: 9.8 mg/dL (ref 8.9–10.3)
Chloride: 103 mmol/L (ref 98–111)
Creatinine, Ser: 0.98 mg/dL (ref 0.44–1.00)
GFR, Estimated: 53 mL/min — ABNORMAL LOW
Glucose, Bld: 92 mg/dL (ref 70–99)
Potassium: 4.1 mmol/L (ref 3.5–5.1)
Sodium: 140 mmol/L (ref 135–145)
Total Bilirubin: 0.6 mg/dL (ref 0.0–1.2)
Total Protein: 7.8 g/dL (ref 6.5–8.1)

## 2024-05-10 LAB — RESP PANEL BY RT-PCR (RSV, FLU A&B, COVID)  RVPGX2
Influenza A by PCR: NEGATIVE
Influenza B by PCR: NEGATIVE
Resp Syncytial Virus by PCR: NEGATIVE
SARS Coronavirus 2 by RT PCR: NEGATIVE

## 2024-05-10 LAB — TSH: TSH: 0.719 u[IU]/mL (ref 0.350–4.500)

## 2024-05-10 MED ORDER — FOSFOMYCIN TROMETHAMINE 3 G PO PACK
3.0000 g | PACK | Freq: Once | ORAL | Status: AC
  Administered 2024-05-11: 3 g via ORAL
  Filled 2024-05-10: qty 3

## 2024-05-10 NOTE — ED Triage Notes (Signed)
 Pt BIB [facility] from home. Pt family reports pt altered. Pt recently treated for UTI, bed bound with prior stroke history (left sided deficit baseline). Pt family reports pt speech is not at baseline. Family also reports pt has had poor oral intake for past 3 days.  150/80 67HR 98% RA 143CBG 18RR

## 2024-05-10 NOTE — ED Provider Notes (Signed)
 " [facility] AT [facility] Provider Note  CSN: [id number] Arrival date & time: 05/10/24 1929  Chief Complaint(s) No chief complaint on file.  HPI [name] is a 89 y.o. female with history of prior CVA, recently treated for UTI, bedbound due to the prior CVAs who is here today because the patient reportedly was seeming tired, not speaking as much.  Her home health aide the patient's daughter who is presently at bedside, and then called EMS.  Patient's daughter reports that she had asked the home health aide not to call EMS as sometimes patient gets sleepy and this can happen.  She is worried about the patient possibly being dehydrated.  She states that the patient has had a decreased appetite over the last few weeks.   Past Medical History Past Medical History:  Diagnosis Date   Allergic rhinitis    Arthritis    Carpal tunnel syndrome    CVA (cerebral vascular accident) (HCC)    Deep venous thrombosis (HCC)    bilateral legs   Degenerative arthritis    right knee   Hypertension    Lumbar degenerative disc disease    Lump or mass in breast    Paresthesia    Syncope 05/24/2013   Vitamin D deficiency    Patient Active Problem List   Diagnosis Date Noted   Acute encephalopathy 04/19/2023   Acute metabolic encephalopathy 04/19/2023   Cellulitis 01/09/2023   Pressure ulcer 01/09/2023   Eschar of toe 08/22/2022   Hematuria    Hemiplegia as late effect of cerebral infarction (HCC)    Anemia 01/21/2022   Diarrhea 01/21/2022   Occult blood positive stool 01/21/2022   Obesity (BMI 30-39.9) 01/28/2021   Acute CVA (cerebrovascular accident) (HCC) 01/25/2021   Acute kidney injury 01/25/2021   CVA (cerebral vascular accident) (HCC) 01/06/2021   Essential hypertension 01/06/2021   Arthritis 01/06/2021   Pain due to onychomycosis of toenails of both feet 12/14/2018   Presbycusis of both ears 09/22/2015   Syncope 05/24/2013   Home  Medication(s) Prior to Admission medications  Medication Sig Start Date End Date Taking? Authorizing Provider  albuterol  (PROVENTIL ) (2.5 MG/3ML) 0.083% nebulizer solution Take 2.5 mg by nebulization every 6 (six) hours as needed. 08/11/22   [provider]  albuterol  (VENTOLIN  HFA) 108 (90 Base) MCG/ACT inhaler Inhale 1-2 puffs into the lungs every 6 (six) hours as needed for wheezing or shortness of breath.    [provider]  apixaban  (ELIQUIS ) 5 MG TABS tablet Take 5 mg by mouth 2 (two) times daily.    [provider]  atorvastatin  (LIPITOR) 20 MG tablet Take 1 tablet (20 mg total) by mouth daily. Patient taking differently: Take 20 mg by mouth at bedtime. 01/07/21   Inga Earnie GRADE, PA-C  diclofenac  sodium (VOLTAREN ) 1 % GEL Apply 1 application topically 4 (four) times daily as needed (knee and hand pain).    [provider]  escitalopram  (LEXAPRO ) 10 MG tablet Take 10 mg by mouth every evening. 05/25/21   [provider]  furosemide (LASIX) 20 MG tablet Take 20 mg by mouth daily as needed. 01/23/23   [provider]  hydrOXYzine  (ATARAX ) 10 MG tablet Take 10 mg by mouth every 6 (six) hours as needed. 12/18/22   [provider]  lactobacillus acidophilus (BACID) TABS tablet Take 1 tablet by mouth daily.    [provider]  loperamide  (IMODIUM ) 2 MG capsule Take 1 capsule (2 mg total)  by mouth as needed for diarrhea or loose stools. 01/24/22   Christobal Guadalajara, MD  losartan  (COZAAR ) 50 MG tablet Take 50 mg by mouth daily.    [provider]  meclizine  (ANTIVERT ) 25 MG tablet Take 25 mg by mouth 3 (three) times daily as needed for dizziness. As needed for vertigo    [provider]  NOREL AD 4-10-325 MG TABS Take 1 tablet by mouth 2 (two) times daily as needed. 12/05/22   [provider]  nystatin  (MYCOSTATIN /NYSTOP ) powder Apply 1 Application topically. 02/22/23   [provider]  pregabalin  (LYRICA )  100 MG capsule Take 100 mg by mouth 3 (three) times daily. 05/31/21   [provider]  zinc  oxide 20 % ointment Apply 1 Application topically 2 (two) times daily as needed for irritation. 12/04/22   [provider]                                                                                                                                    Past Surgical History Past Surgical History:  Procedure Laterality Date   ABDOMINAL HYSTERECTOMY     CARPAL TUNNEL RELEASE Right    CATARACT EXTRACTION Bilateral    HEMORROIDECTOMY     IVC Filter     TOTAL HIP ARTHROPLASTY Right    ULNAR NERVE TRANSPOSITION Right    Family History Family History  Problem Relation Age of Onset   Diabetes Mother    Kidney failure Father    Cancer - Lung Brother    Cancer - Prostate Brother    Kidney failure Son     Social History Social History[1] Allergies Codeine and Penicillins  Review of Systems Review of Systems  Physical Exam Vital Signs  I have reviewed the triage vital signs BP (!) 149/70 (BP Location: Right Arm)   Pulse 67   Temp (!) 97.5 F (36.4 C) (Oral)   Resp 18   SpO2 95%   Physical Exam Vitals and nursing note reviewed.  Eyes:     Extraocular Movements: Extraocular movements intact.  Cardiovascular:     Rate and Rhythm: Normal rate.  Pulmonary:     Effort: Pulmonary effort is normal.  Abdominal:     General: Abdomen is flat. There is no distension.     Palpations: Abdomen is soft.     Tenderness: There is no abdominal tenderness.  Musculoskeletal:        General: No swelling or deformity. Normal range of motion.     Cervical back: Normal range of motion.  Skin:    General: Skin is warm.  Neurological:     Mental Status: She is alert. Mental status is at baseline.     Comments: Left-sided hemiparesis.  Patient speaking and answering questions.     ED Results and Treatments Labs (all labs ordered are listed, but only abnormal results are  displayed) Labs Reviewed  COMPREHENSIVE METABOLIC PANEL WITH  GFR - Abnormal; Notable for the following components:      Result Value   GFR, Estimated 53 (*)    All other components within normal limits  CBC WITH DIFFERENTIAL/PLATELET - Abnormal; Notable for the following components:   MCH 25.4 (*)    MCHC 29.1 (*)    RDW 18.4 (*)    All other components within normal limits  URINALYSIS, W/ REFLEX TO CULTURE (INFECTION SUSPECTED) - Abnormal; Notable for the following components:   Color, Urine AMBER (*)    APPearance CLOUDY (*)    Hgb urine dipstick LARGE (*)    Protein, ur 100 (*)    Leukocytes,Ua SMALL (*)    All other components within normal limits  RESP PANEL BY RT-PCR (RSV, FLU A&B, COVID)  RVPGX2  URINE CULTURE  TSH                                                                                                                          Radiology DG Chest 1 View Result Date: 05/10/2024 EXAM: 1 VIEW XRAY OF THE CHEST 05/10/2024 09:13:10 PM COMPARISON: 04/19/2023 CLINICAL HISTORY: Cough. FINDINGS: LUNGS AND PLEURA: The lungs are well aerated bilaterally. Fullness in the right infrahilar region is noted, felt to be vascular in nature given the comparison film and significant rotation to the left. No other focal abnormality is noted. No pleural effusion. No pneumothorax. HEART AND MEDIASTINUM: The cardiac shadow is within normal limits. BONES AND SOFT TISSUES: No acute osseous abnormality. IMPRESSION: 1. No acute cardiopulmonary findings. 2. Right infrahilar fullness, favored to be vascular related to patient rotation and similar to the comparison exam. Electronically signed by: Oneil Devonshire MD 05/10/2024 09:17 PM EST RP Workstation: [id number]   CT Head Wo Contrast Result Date: 05/10/2024 EXAM: CT HEAD WITHOUT CONTRAST 05/10/2024 08:19:57 PM TECHNIQUE: CT of the head was performed without the administration of intravenous contrast. Automated exposure control, iterative reconstruction,  and/or weight based adjustment of the mA/kV was utilized to reduce the radiation dose to as low as reasonably achievable. COMPARISON: 04/18/2023. CLINICAL HISTORY: Delirium. FINDINGS: BRAIN AND VENTRICLES: Parenchymal volume loss is commensurate with the patient's age. Periventricular white matter changes are present likely reflecting the sequela of small vessel ischemia. Remote right frontoparietal cortical infarct, unchanged. No acute hemorrhage. No evidence of acute infarct. No hydrocephalus. No extra-axial collection. No mass effect or midline shift. ORBITS: No acute abnormality. SINUSES: Moderate mucosal thickening with small layering fluid within the right sphenoid sinus. Remaining paranasal sinuses are clear. Mastoid air cells and middle ear cavities are clear. SOFT TISSUES AND SKULL: No acute soft tissue abnormality. No skull fracture. IMPRESSION: 1. No acute intracranial abnormality. 2. Remote right frontoparietal cortical infarct, unchanged. 3. Periventricular white matter changes likely reflecting chronic small vessel ischemia. 4. Parenchymal volume loss commensurate with age. 5. Stable right sphenoid sinus disease. Electronically signed by: Dorethia Molt MD 05/10/2024 08:24 PM EST RP Workstation: [id number]    Pertinent labs & imaging results that were  available during my care of the patient were reviewed by me and considered in my medical decision making (see MDM for details).  Medications Ordered in ED Medications  fosfomycin (MONUROL) packet 3 g (has no administration in time range)                                                                                                                                     Procedures Procedures  (including critical care time)  Medical Decision Making / ED Course   This patient presents to the ED for concern of transient altered mental status, this involves an extensive number of treatment options, and is a complaint that carries with it a high  risk of complications and morbidity.  The differential diagnosis includes fatigue, CVA, electrolyte abnormalities, cystitis.  MDM: Patient has returned to baseline, is not have any trouble with speech at this time.  She is currently resting comfortably.  Daughter is at bedside.  Reviewing patient's blood work, creatinine is at baseline, hemoglobin is at baseline.  CT head negative for acute process, chest x-ray negative for acute process.  TSH negative.  Urine still in process.  I discussion with the daughter about the patient's reassuring workup thus far, given her advanced age and return to baseline, likely little yield in admission and the potential for harm.  Daughter was agreeable with this plan.  Urine showing possible UTI, treated with fosfomycin.  Will discharge.   Additional history obtained: -Additional history obtained from daughter at bedside -External records from outside source obtained and reviewed including: Chart review including previous notes, labs, imaging, consultation notes   Lab Tests: -I ordered, reviewed, and interpreted labs.   The pertinent results include:   Labs Reviewed  COMPREHENSIVE METABOLIC PANEL WITH GFR - Abnormal; Notable for the following components:      Result Value   GFR, Estimated 53 (*)    All other components within normal limits  CBC WITH DIFFERENTIAL/PLATELET - Abnormal; Notable for the following components:   MCH 25.4 (*)    MCHC 29.1 (*)    RDW 18.4 (*)    All other components within normal limits  URINALYSIS, W/ REFLEX TO CULTURE (INFECTION SUSPECTED) - Abnormal; Notable for the following components:   Color, Urine AMBER (*)    APPearance CLOUDY (*)    Hgb urine dipstick LARGE (*)    Protein, ur 100 (*)    Leukocytes,Ua SMALL (*)    All other components within normal limits  RESP PANEL BY RT-PCR (RSV, FLU A&B, COVID)  RVPGX2  URINE CULTURE  TSH    Imaging Studies ordered: I ordered imaging studies including CT head, chest x-ray I  independently visualized and interpreted imaging. I agree with the radiologist interpretation   Medicines ordered and prescription drug management: Meds ordered this encounter  Medications   fosfomycin (MONUROL) packet 3 g    -I have reviewed the patients home medicines  and have made adjustments as needed    Cardiac Monitoring: The patient was maintained on a cardiac monitor.  I personally viewed and interpreted the cardiac monitored which showed an underlying rhythm of: Normal sinus rhythm  Social Determinants of Health:  Factors impacting patients care include: Medical comorbidities including history of CVA   Reevaluation: After the interventions noted above, I reevaluated the patient and found that they have :improved  Co morbidities that complicate the patient evaluation  Past Medical History:  Diagnosis Date   Allergic rhinitis    Arthritis    Carpal tunnel syndrome    CVA (cerebral vascular accident) (HCC)    Deep venous thrombosis (HCC)    bilateral legs   Degenerative arthritis    right knee   Hypertension    Lumbar degenerative disc disease    Lump or mass in breast    Paresthesia    Syncope 05/24/2013   Vitamin D deficiency       Dispostion: I considered admission for this patient, however given her reassuring workup, her return to baseline and advanced age, patient would benefit more from outpatient management.     Final Clinical Impression(s) / ED Diagnoses Final diagnoses:  Cystitis     @PCDICTATION @     [1]  Social History Tobacco Use   Smoking status: Every Day    Current packs/day: 10.00    Average packs/day: 10.0 packs/day for 30.0 years (300.0 ttl pk-yrs)    Types: Cigarettes    Passive exposure: Never   Smokeless tobacco: Never  Vaping Use   Vaping status: Never Used  Substance Use Topics   Alcohol use: Yes    Comment: occasional   Drug use: No     Mannie Pac T, DO 05/10/24 2350  "

## 2024-05-10 NOTE — Discharge Instructions (Addendum)
 While you were in the emergency room, you had a CT scan done of your head that did not show any changes, your chest x-ray was normal.  Your blood work was also normal.  Your urine showed possible infection.  We gave you a dose of medicine here called fosfomycin.  You do not need any additional antibiotics.  Continue to take all medications as prescribed, please follow-up with your primary care doctor.  Return to the emergency room if you have fever, are not eating or drinking, develop increased confusion, or if behavior continues to change.  Please call your primary care doctor next week for a follow-up appointment next week.

## 2024-05-23 NOTE — Progress Notes (Signed)
 Viral panel ordered to assess for viral illness.

## 2024-07-23 ENCOUNTER — Ambulatory Visit: Admitting: Podiatry
